# Patient Record
Sex: Female | Born: 1960
Health system: Southern US, Community
[De-identification: ages and names within clinical notes are randomized; demographics above are authoritative.]

## PROBLEM LIST (undated history)

## (undated) DIAGNOSIS — E039 Hypothyroidism, unspecified: Secondary | ICD-10-CM

## (undated) DIAGNOSIS — G629 Polyneuropathy, unspecified: Secondary | ICD-10-CM

## (undated) DIAGNOSIS — E669 Obesity, unspecified: Secondary | ICD-10-CM

## (undated) DIAGNOSIS — E118 Type 2 diabetes mellitus with unspecified complications: Secondary | ICD-10-CM

## (undated) DIAGNOSIS — E1165 Type 2 diabetes mellitus with hyperglycemia: Secondary | ICD-10-CM

## (undated) DIAGNOSIS — IMO0002 Reserved for concepts with insufficient information to code with codable children: Secondary | ICD-10-CM

## (undated) DIAGNOSIS — Z9289 Personal history of other medical treatment: Secondary | ICD-10-CM

## (undated) DIAGNOSIS — F32A Depression, unspecified: Secondary | ICD-10-CM

## (undated) DIAGNOSIS — I10 Essential (primary) hypertension: Secondary | ICD-10-CM

## (undated) DIAGNOSIS — D649 Anemia, unspecified: Secondary | ICD-10-CM

## (undated) DIAGNOSIS — L97509 Non-pressure chronic ulcer of other part of unspecified foot with unspecified severity: Secondary | ICD-10-CM

## (undated) DIAGNOSIS — H547 Unspecified visual loss: Secondary | ICD-10-CM

## (undated) DIAGNOSIS — IMO0001 Reserved for inherently not codable concepts without codable children: Secondary | ICD-10-CM

## (undated) DIAGNOSIS — N189 Chronic kidney disease, unspecified: Secondary | ICD-10-CM

## (undated) DIAGNOSIS — E11621 Type 2 diabetes mellitus with foot ulcer: Secondary | ICD-10-CM

## (undated) DIAGNOSIS — R079 Chest pain, unspecified: Secondary | ICD-10-CM

## (undated) DIAGNOSIS — H269 Unspecified cataract: Secondary | ICD-10-CM

## (undated) DIAGNOSIS — F329 Major depressive disorder, single episode, unspecified: Secondary | ICD-10-CM

## (undated) HISTORY — PX: TUBAL LIGATION: SHX77

---

## 2012-10-26 ENCOUNTER — Emergency Department (HOSPITAL_COMMUNITY)
Admission: EM | Admit: 2012-10-26 | Discharge: 2012-10-26 | Disposition: A | Discharge status: Home / Self Care | Payer: Self-pay | Attending: Emergency Medicine | Admitting: Emergency Medicine

## 2012-10-26 ENCOUNTER — Emergency Department (HOSPITAL_COMMUNITY): Payer: Self-pay

## 2012-10-26 ENCOUNTER — Encounter (HOSPITAL_COMMUNITY): Payer: Self-pay | Admitting: Emergency Medicine

## 2012-10-26 DIAGNOSIS — R3589 Other polyuria: Secondary | ICD-10-CM | POA: Insufficient documentation

## 2012-10-26 DIAGNOSIS — R059 Cough, unspecified: Secondary | ICD-10-CM | POA: Insufficient documentation

## 2012-10-26 DIAGNOSIS — Z8639 Personal history of other endocrine, nutritional and metabolic disease: Secondary | ICD-10-CM | POA: Insufficient documentation

## 2012-10-26 DIAGNOSIS — H538 Other visual disturbances: Secondary | ICD-10-CM | POA: Insufficient documentation

## 2012-10-26 DIAGNOSIS — R05 Cough: Secondary | ICD-10-CM | POA: Insufficient documentation

## 2012-10-26 DIAGNOSIS — IMO0001 Reserved for inherently not codable concepts without codable children: Secondary | ICD-10-CM | POA: Insufficient documentation

## 2012-10-26 DIAGNOSIS — Z9119 Patient's noncompliance with other medical treatment and regimen: Secondary | ICD-10-CM | POA: Insufficient documentation

## 2012-10-26 DIAGNOSIS — I10 Essential (primary) hypertension: Secondary | ICD-10-CM | POA: Insufficient documentation

## 2012-10-26 DIAGNOSIS — R739 Hyperglycemia, unspecified: Secondary | ICD-10-CM

## 2012-10-26 DIAGNOSIS — Z862 Personal history of diseases of the blood and blood-forming organs and certain disorders involving the immune mechanism: Secondary | ICD-10-CM | POA: Insufficient documentation

## 2012-10-26 DIAGNOSIS — R110 Nausea: Secondary | ICD-10-CM | POA: Insufficient documentation

## 2012-10-26 DIAGNOSIS — R350 Frequency of micturition: Secondary | ICD-10-CM | POA: Insufficient documentation

## 2012-10-26 DIAGNOSIS — R42 Dizziness and giddiness: Secondary | ICD-10-CM | POA: Insufficient documentation

## 2012-10-26 DIAGNOSIS — E1165 Type 2 diabetes mellitus with hyperglycemia: Secondary | ICD-10-CM

## 2012-10-26 DIAGNOSIS — R0602 Shortness of breath: Secondary | ICD-10-CM | POA: Insufficient documentation

## 2012-10-26 DIAGNOSIS — Z91199 Patient's noncompliance with other medical treatment and regimen due to unspecified reason: Secondary | ICD-10-CM | POA: Insufficient documentation

## 2012-10-26 DIAGNOSIS — Z79899 Other long term (current) drug therapy: Secondary | ICD-10-CM | POA: Insufficient documentation

## 2012-10-26 DIAGNOSIS — IMO0002 Reserved for concepts with insufficient information to code with codable children: Secondary | ICD-10-CM | POA: Insufficient documentation

## 2012-10-26 DIAGNOSIS — L97509 Non-pressure chronic ulcer of other part of unspecified foot with unspecified severity: Secondary | ICD-10-CM | POA: Insufficient documentation

## 2012-10-26 DIAGNOSIS — R358 Other polyuria: Secondary | ICD-10-CM | POA: Insufficient documentation

## 2012-10-26 DIAGNOSIS — Z794 Long term (current) use of insulin: Secondary | ICD-10-CM | POA: Insufficient documentation

## 2012-10-26 HISTORY — DX: Essential (primary) hypertension: I10

## 2012-10-26 LAB — URINALYSIS, ROUTINE W REFLEX MICROSCOPIC
Glucose, UA: >1000 mg/dL — AB
Leukocytes, UA: NEGATIVE
Specific Gravity, Urine: 1.04 — ABNORMAL HIGH (ref 1.005–1.030)
pH: 5 (ref 5.0–8.0)

## 2012-10-26 LAB — CBC WITH DIFFERENTIAL/PLATELET
Basophils Absolute: 0 10*3/uL (ref 0.0–0.1)
Eosinophils Relative: 2 % (ref 0–5)
Lymphocytes Relative: 31 % (ref 12–46)
Lymphs Abs: 3.2 10*3/uL (ref 0.7–4.0)
MCV: 81.2 fL (ref 78.0–100.0)
Neutro Abs: 6.2 10*3/uL (ref 1.7–7.7)
Platelets: 269 10*3/uL (ref 150–400)
RBC: 5.2 MIL/uL — ABNORMAL HIGH (ref 3.87–5.11)
WBC: 10.2 10*3/uL (ref 4.0–10.5)

## 2012-10-26 LAB — BASIC METABOLIC PANEL
CO2: 28 mEq/L (ref 19–32)
Chloride: 101 mEq/L (ref 96–112)
Glucose, Bld: 544 mg/dL — ABNORMAL HIGH (ref 70–99)
Potassium: 4.8 mEq/L (ref 3.5–5.1)
Sodium: 137 mEq/L (ref 135–145)

## 2012-10-26 LAB — GLUCOSE, CAPILLARY
Glucose-Capillary: 540 mg/dL — ABNORMAL HIGH (ref 70–99)
Glucose-Capillary: 415 mg/dL — ABNORMAL HIGH (ref 70–99)

## 2012-10-26 LAB — URINE MICROSCOPIC-ADD ON

## 2012-10-26 MED ORDER — SODIUM CHLORIDE 0.9 % IV BOLUS (SEPSIS)
1000.0000 mL | Freq: Once | INTRAVENOUS | Status: AC
  Administered 2012-10-26: 1000 mL via INTRAVENOUS

## 2012-10-26 MED ORDER — SODIUM CHLORIDE 0.9 % IV BOLUS (SEPSIS)
1000.0000 mL | INTRAVENOUS | Status: AC
  Administered 2012-10-26: 1000 mL via INTRAVENOUS

## 2012-10-26 MED ORDER — GLUCOSE BLOOD VI STRP: ORAL_STRIP | Status: DC

## 2012-10-26 MED ORDER — INSULIN NPH ISOPHANE & REGULAR (70-30) 100 UNIT/ML ~~LOC~~ SUSP: 25.0000 [IU] | Freq: Two times a day (BID) | SUBCUTANEOUS | Status: DC

## 2012-10-26 MED ORDER — INSULIN ASPART 100 UNIT/ML ~~LOC~~ SOLN
10.0000 [IU] | Freq: Once | SUBCUTANEOUS | Status: AC
  Administered 2012-10-26: 10 [IU] via SUBCUTANEOUS
  Filled 2012-10-26: qty 1

## 2012-10-26 MED ORDER — IBUPROFEN 400 MG PO TABS
400.0000 mg | ORAL_TABLET | Freq: Once | ORAL | Status: AC
Start: 2012-10-26 — End: 2012-10-26
  Administered 2012-10-26: 400 mg via ORAL
  Filled 2012-10-26: qty 1

## 2012-10-26 MED ORDER — GLIMEPIRIDE 1 MG PO TABS: 4.0000 mg | ORAL_TABLET | Freq: Every day | ORAL | Status: DC

## 2012-10-26 NOTE — ED Notes (Signed)
Per family friend pt was picked up from airport and had some sob, dizziness, near syncopal episode, blurry vision, and frequent urination. Pt has not taken any medications in 4 days due to traveling. Pt has hx high blood pressure and diabetes.

## 2012-10-26 NOTE — ED Provider Notes (Signed)
52 year old female who is a diabetic who has been unmedicated for the last several days that she has been traveling from the [place] by plane. She presents with increasing thirst and generalized weakness with a blood sugar of greater than 500 on arrival. On exam the patient has a soft abdomen, clear heart and lung sounds after coughing. No peripheral edema or asymmetry of the lower extremities, though she does have a small eschar to the right fourth toe on the lateral surface and the left fifth toe on the lateral surface. clear oropharynx with normal appearing mucous membranes. Laboratory workup shows that the patient is hyperglycemic but no signs of ketoacidosis or increased anion gap acidosis. She has been given IV fluids greater than 2 L, subcutaneous insulin and is now asking to eat. Diabetic diet ordered, recheck blood sugar, will need prescription medications and followup in the outpatient setting. The lesions that she has on her toes appear to be chronic, not acutely infected, not erythematous, not swollen not warm.   Medical screening examination/treatment/procedure(s) were conducted as a shared visit with non-physician practitioner(s) and myself.  I personally evaluated the patient during the encounter    Vida Roller, MD 10/26/12 1510

## 2012-10-26 NOTE — ED Notes (Signed)
CBG 540. 

## 2012-10-26 NOTE — ED Notes (Signed)
Pt c/o headache

## 2012-10-26 NOTE — ED Notes (Signed)
Meal tray ordered for pt  

## 2012-10-26 NOTE — ED Notes (Signed)
Pt received meal tray. 

## 2012-10-26 NOTE — ED Provider Notes (Signed)
History     CSN: [id number]  Arrival date & time 10/26/12  1108   First MD Initiated Contact with Patient 10/26/12 1120      Chief Complaint  Patient presents with  . Hyperglycemia    (Consider location/radiation/quality/duration/timing/severity/associated sxs/prior treatment) HPI Comments: Is a 52 year old female with a history of hypertension and diabetes who presents for lightheadedness, dizziness, intermittent blurry vision, polyuria, and frequent urination without aggravating or alleviating factors. Patient admits to associated nausea and intermittent SOB. She denies LOC, fever, CP, vomiting, and abdominal pain. Prior to arrival the patient arrived from [place] as she is immigrating to the [place]. She states that she has not taken any of her medications in 4 days because of her extensive travel from [place]. Patient usually takes 70/30 insulin, 30 units in the morning and 25 units at night. She also takes Tenormin 100mg  daily for her blood pressure which she also denies taking secondary to travel.  The history is provided by a friend, a relative and the patient. The history is limited by a language barrier. A language interpreter was used.    Past Medical History  Diagnosis Date  . Diabetes mellitus without complication   . Hypertension   . Thyroid disease     hypothyroid    History reviewed. No pertinent past surgical history.  History reviewed. No pertinent family history.  History  Substance Use Topics  . Smoking status: Not on file  . Smokeless tobacco: Not on file  . Alcohol Use: Not on file    OB History   Grav Para Term Preterm Abortions TAB SAB Ect Mult Living                  Review of Systems  Constitutional: Negative for fever.  Eyes: Positive for visual disturbance. Negative for pain.       +intermittent blurry vision  Respiratory: Positive for cough and shortness of breath.   Cardiovascular: Negative for chest pain.  Gastrointestinal: Negative  for vomiting and abdominal pain.  Endocrine: Negative for polydipsia and polyuria.  Musculoskeletal: Positive for myalgias.  Skin: Negative for color change and rash.  Neurological: Positive for dizziness and light-headedness. Negative for syncope.  All other systems reviewed and are negative.   Allergies  Review of patient's allergies indicates no known allergies.  Home Medications   Current Outpatient Rx  Name  Route  Sig  Dispense  Refill  . atenolol (TENORMIN) 100 MG tablet   Oral   Take 100 mg by mouth daily.         Marland Kitchen glimepiride (AMARYL) 4 MG tablet   Oral   Take 8 mg by mouth daily.         . insulin NPH-regular (NOVOLIN 70/30) (70-30) 100 UNIT/ML injection   Subcutaneous   Inject 25-30 Units into the skin 2 (two) times daily with a meal. 30 units every morning and 25 units at dinner         . glimepiride (AMARYL) 1 MG tablet   Oral   Take 4 tablets (4 mg total) by mouth daily before breakfast.   120 tablet   1   . glucose blood test strip      Use as instructed   100 each   12   . insulin NPH-regular (NOVOLIN 70/30) (70-30) 100 UNIT/ML injection   Subcutaneous   Inject 25-30 Units into the skin 2 (two) times daily. Take 30 units every morning and 25 units at dinner. Refill x 1.  10 mL   12     BP 104/50  Pulse 74  Temp(Src) 98.2 F (36.8 C) (Oral)  Resp 21  SpO2 99%  Physical Exam  Nursing note and vitals reviewed. Constitutional: She is oriented to person, place, and time. She appears well-developed and well-nourished. No distress.  Morbildy obese female, sick but nontoxic appearing. In NAD  HENT:  Head: Normocephalic and atraumatic.  Nose: Nose normal.  Oropharynx clear with dry mucous membranes  Eyes: Conjunctivae are normal. Pupils are equal, round, and reactive to light. No scleral icterus.  Neck: Normal range of motion. Neck supple.  Cardiovascular: Normal heart sounds and intact distal pulses.   Tachycardic rate, regular rhythm.  Distal radial and posterior tibial pulses 2+ b/l; dorsalis pedis pulses 1+ b/l. Capillary refill <2 seconds in b/l upper and lower extremities.  Pulmonary/Chest: No respiratory distress. She has no wheezes. She has no rales.  Poor inspiratory effort. Rhonchorous breath sounds appreciated in the left upper lung field on auscultation.  Abdominal: Soft. She exhibits no mass. There is no tenderness. There is no rebound and no guarding.  Musculoskeletal: Normal range of motion. She exhibits no edema.  Lymphadenopathy:    She has no cervical adenopathy.  Neurological: She is alert and oriented to person, place, and time.  Skin: Skin is warm and dry. No rash noted. She is not diaphoretic. No erythema. No pallor.  Patient with eschar to dorsal surface of R 4th toe and L 5th toe without swelling, warmth, erythema, or drainage.  Psychiatric: She has a normal mood and affect. Her behavior is normal.    ED Course  Procedures (including critical care time)  Labs Reviewed  CBC WITH DIFFERENTIAL - Abnormal; Notable for the following:    RBC 5.20 (*)    All other components within normal limits  BASIC METABOLIC PANEL - Abnormal; Notable for the following:    Glucose, Bld 544 (*)    BUN 25 (*)    All other components within normal limits  URINALYSIS, ROUTINE W REFLEX MICROSCOPIC - Abnormal; Notable for the following:    Specific Gravity, Urine 1.040 (*)    Glucose, UA >1000 (*)    Hgb urine dipstick LARGE (*)    All other components within normal limits  GLUCOSE, CAPILLARY - Abnormal; Notable for the following:    Glucose-Capillary 540 (*)    All other components within normal limits  GLUCOSE, CAPILLARY - Abnormal; Notable for the following:    Glucose-Capillary 415 (*)    All other components within normal limits  URINE MICROSCOPIC-ADD ON - Abnormal; Notable for the following:    Squamous Epithelial / LPF MANY (*)    All other components within normal limits  GLUCOSE, CAPILLARY - Abnormal;  Notable for the following:    Glucose-Capillary 248 (*)    All other components within normal limits   Dg Chest 2 View  10/26/2012  *RADIOLOGY REPORT*  Clinical Data: Short of breath.  Cough.  Chest pain.  Hypertension and diabetes.  CHEST - 2 VIEW  Comparison: None.  Findings: Low lung volumes are seen with mild bibasilar atelectasis.  Respiratory motion also noted on the lateral projection.  No evidence of pulmonary consolidation or congestive heart failure.  No evidence of pleural effusion.  Heart size is within normal limits.  IMPRESSION: Low lung volumes and mild bibasilar atelectasis.   Original Report Authenticated By: Myles Rosenthal, M.D.    Dg Foot Complete Left  10/26/2012  *RADIOLOGY REPORT*  Clinical Data:  Ulcers.  Diabetic.  LEFT FOOT - COMPLETE 3+ VIEW  Comparison: None.  Findings: No acute bony abnormality.  Specifically, no fracture, subluxation, or dislocation.  Soft tissues are intact.  No radiographic changes to suggest osteomyelitis.  Dense vascular calcifications are present.  IMPRESSION: No acute bony abnormality.   Original Report Authenticated By: Charlett Nose, M.D.    Dg Foot Complete Right  10/26/2012  *RADIOLOGY REPORT*  Clinical Data: Hyperglycemia.  Ulcer.  Diabetic.  RIGHT FOOT COMPLETE - 3+ VIEW  Comparison: None  Findings: Degenerative changes in the right first MTP joint with joint space narrowing and spurring. No acute bony abnormality. Specifically, no fracture, subluxation, or dislocation.  Soft tissues are intact.  No radiographic changes of osteomyelitis. Dense vascular calcifications present.  IMPRESSION: No acute bony abnormality.   Original Report Authenticated By: Charlett Nose, M.D.      Date: 10/26/2012  Rate: 95  Rhythm: normal sinus rhythm  QRS Axis: left  Intervals: normal  ST/T Wave abnormalities: normal  Conduction Disutrbances:left anterior fascicular block  Narrative Interpretation: NSR with PRWP and LAFB; no STEMI  Old EKG Reviewed: none available I  have personally reviewed and interpreted this EKG.   1. Hyperglycemia   2. Diabetes mellitus type 2, uncontrolled     MDM  Patient is a 52 year old female who immigrated to the [place] this morning who presents with dizziness, blurry vision, polyuria, and polydipsia. Patient also complains of shortness of breath with rhonchorous breath sounds appreciated in the upper left lung field. Patient endorses noncompliance with her blood pressure medication and insulin. Workup to include CBC, BMP, urinalysis, and chest x-ray. IV fluid bolus ordered.  Patient with normal anion gap of 8 without signs of DKA or anion gap acidosis. CBG 540. Eschars on b/l feet, later appreciated on physical exam, appear to be the result of diabetic neuropathy; no signs of infection such as swelling, erythema, or warmth. Will add DG b/l feet to r/o bone involvement. Second IVF bolus ordered.  DG b/l feet without evidence of bony abnormalities. CBG trending down; 415 from 540. Patient without complaints at this time; is feeling hungry. Diabetic meal to be given and patient tolerating PO fluids.  Patient's symptoms have greatly improved since arrival in ED. CBG down to 169. Patient otherwise hemodynamically stable, well and nontoxic appearing, resting comfortably in the bed. Stable for d/c with PCP follow up for further evaluation and management of her diabetes. Rx for Novolin 70/30 as well as glimepiride prescribed for patient; care management to coordinate filling patient's prescriptions as she is without insurance and new to the country less than 24 hours. Patient's sister instructed to come to ED tomorrow to pick up prescriptions. Have explained that atenolol will not be filled at this time as patient has not been hypertensive during her ED stay today. Indications for ED return provided. Patient and family state comfort and understanding with this d/c plan with no unaddressed concerns. Patient seen, also, by Dr. Hyacinth Meeker who  is in agreement with this patient's work up and management plan.   Antony Madura, PA-C 10/26/12 2114

## 2012-10-27 NOTE — Progress Notes (Signed)
10/27/12 1105 Received TC from PA in Emergency Room last night about pt. need for Medication assistance.  Pt. is eligible for [facility] program.  Printed MATCH letter and this NCM gave the charge nurse, Martie Lee, the [facility] letter to give to pt. today when she comes in for her RX of Insulin and Amaryl.  Explained to [place], Charity fundraiser, to let pt. know she has to use one of the pharmacies list on the [facility] letter, and she has to use the letter within 7days.  Tera Mater, RN, BSN NCM [phone]

## 2012-10-28 NOTE — ED Provider Notes (Signed)
  Medical screening examination/treatment/procedure(s) were conducted as a shared visit with non-physician practitioner(s) and myself.  I personally evaluated the patient during the encounter  Please see my separate respective documentation pertaining to this patient encounter   Vida Roller, MD 10/28/12 1027

## 2012-10-29 LAB — GLUCOSE, CAPILLARY: Glucose-Capillary: 169 mg/dL — ABNORMAL HIGH (ref 70–99)

## 2012-10-31 ENCOUNTER — Emergency Department (HOSPITAL_COMMUNITY): Payer: Medicaid Other

## 2012-10-31 ENCOUNTER — Emergency Department (HOSPITAL_COMMUNITY)
Admission: EM | Admit: 2012-10-31 | Discharge: 2012-10-31 | Disposition: A | Discharge status: Home / Self Care | Payer: Medicaid Other | Attending: Emergency Medicine | Admitting: Emergency Medicine

## 2012-10-31 ENCOUNTER — Encounter (HOSPITAL_COMMUNITY): Payer: Self-pay | Admitting: Emergency Medicine

## 2012-10-31 DIAGNOSIS — F411 Generalized anxiety disorder: Secondary | ICD-10-CM | POA: Insufficient documentation

## 2012-10-31 DIAGNOSIS — Z794 Long term (current) use of insulin: Secondary | ICD-10-CM | POA: Insufficient documentation

## 2012-10-31 DIAGNOSIS — F410 Panic disorder [episodic paroxysmal anxiety] without agoraphobia: Secondary | ICD-10-CM

## 2012-10-31 DIAGNOSIS — R079 Chest pain, unspecified: Secondary | ICD-10-CM | POA: Insufficient documentation

## 2012-10-31 DIAGNOSIS — Z79899 Other long term (current) drug therapy: Secondary | ICD-10-CM | POA: Insufficient documentation

## 2012-10-31 DIAGNOSIS — Z8639 Personal history of other endocrine, nutritional and metabolic disease: Secondary | ICD-10-CM | POA: Insufficient documentation

## 2012-10-31 DIAGNOSIS — E119 Type 2 diabetes mellitus without complications: Secondary | ICD-10-CM | POA: Insufficient documentation

## 2012-10-31 DIAGNOSIS — I10 Essential (primary) hypertension: Secondary | ICD-10-CM | POA: Insufficient documentation

## 2012-10-31 DIAGNOSIS — R404 Transient alteration of awareness: Secondary | ICD-10-CM | POA: Insufficient documentation

## 2012-10-31 DIAGNOSIS — Z862 Personal history of diseases of the blood and blood-forming organs and certain disorders involving the immune mechanism: Secondary | ICD-10-CM | POA: Insufficient documentation

## 2012-10-31 DIAGNOSIS — R55 Syncope and collapse: Secondary | ICD-10-CM | POA: Insufficient documentation

## 2012-10-31 LAB — POCT I-STAT 3, ART BLOOD GAS (G3+)
Acid-Base Excess: 3 mmol/L — ABNORMAL HIGH (ref 0.0–2.0)
Bicarbonate: 23.9 mEq/L (ref 20.0–24.0)
O2 Saturation: 92 %
pCO2 arterial: 27.4 mmHg — ABNORMAL LOW (ref 35.0–45.0)
pO2, Arterial: 54 mmHg — ABNORMAL LOW (ref 80.0–100.0)

## 2012-10-31 LAB — BASIC METABOLIC PANEL
BUN: 12 mg/dL (ref 6–23)
Creatinine, Ser: 0.63 mg/dL (ref 0.50–1.10)
GFR calc Af Amer: >90 mL/min (ref >90)
GFR calc non Af Amer: >90 mL/min (ref >90)
Glucose, Bld: 322 mg/dL — ABNORMAL HIGH (ref 70–99)

## 2012-10-31 LAB — GLUCOSE, CAPILLARY: Glucose-Capillary: 268 mg/dL — ABNORMAL HIGH (ref 70–99)

## 2012-10-31 LAB — CBC
HCT: 43.6 % (ref 36.0–46.0)
MCHC: 35.1 g/dL (ref 30.0–36.0)
MCV: 78.4 fL (ref 78.0–100.0)
RDW: 13.1 % (ref 11.5–15.5)

## 2012-10-31 MED ORDER — IOHEXOL 350 MG/ML SOLN
100.0000 mL | Freq: Once | INTRAVENOUS | Status: AC | PRN
  Administered 2012-10-31: 100 mL via INTRAVENOUS

## 2012-10-31 MED ORDER — METHYLPREDNISOLONE SODIUM SUCC 125 MG IJ SOLR
INTRAMUSCULAR | Status: AC
  Filled 2012-10-31: qty 2

## 2012-10-31 MED ORDER — FENTANYL CITRATE 0.05 MG/ML IJ SOLN
100.0000 ug | Freq: Once | INTRAMUSCULAR | Status: AC
  Administered 2012-10-31: 100 ug via INTRAVENOUS
  Filled 2012-10-31: qty 2

## 2012-10-31 MED ORDER — SODIUM CHLORIDE 0.9 % IV BOLUS (SEPSIS)
1000.0000 mL | Freq: Once | INTRAVENOUS | Status: AC
  Administered 2012-10-31: 1000 mL via INTRAVENOUS

## 2012-10-31 MED ORDER — DIPHENHYDRAMINE HCL 50 MG/ML IJ SOLN
INTRAMUSCULAR | Status: AC
  Filled 2012-10-31: qty 1

## 2012-10-31 MED ORDER — ALPRAZOLAM 0.5 MG PO TABS: 0.5000 mg | ORAL_TABLET | Freq: Three times a day (TID) | ORAL | Status: DC | PRN

## 2012-10-31 MED ORDER — LORAZEPAM 2 MG/ML IJ SOLN
1.0000 mg | Freq: Once | INTRAMUSCULAR | Status: AC
  Administered 2012-10-31: 1 mg via INTRAVENOUS
  Filled 2012-10-31: qty 1

## 2012-10-31 NOTE — ED Provider Notes (Signed)
  Physical Exam  BP 110/47  Pulse 83  Temp(Src) 97.9 F (36.6 C) (Oral)  Resp 28  SpO2 99%  Physical Exam  ED Course  Procedures  The patient is most likely having an anxiety type reaction based on history from the sister. The patient has been having these attacks for several years. The patient will be referred to PCP. Told to return here as needed. Not in DKA.       Carlyle Dolly, PA-C 10/31/12 2105

## 2012-10-31 NOTE — ED Notes (Signed)
Per CT staff. Pt received contrast and became increasingly SOB and altered. Pts vital signs WNL. Pt responsive to name. RN and RRT called by CT staff. EDP, EDPA and RN to scanner. Pt respirations moderately labored O2sats 100/2L pt CBG 268. Pt. Does not speak english. Family member brought- sts pt "sometimes goes through this" family sts pt has anxiety.

## 2012-10-31 NOTE — ED Provider Notes (Signed)
History     CSN: [id number]  Arrival date & time 10/31/12  1346   First MD Initiated Contact with Patient 10/31/12 1347      Chief Complaint  Patient presents with  . Loss of Consciousness  . Shortness of Breath    (Consider location/radiation/quality/duration/timing/severity/associated sxs/prior treatment) HPI Comments: Patient is a 52 year old female with a past medical history of diabetes and hypertension who presents after a syncopal episode that occurred prior to arrival. Her sister is at the bedside who provides the history. Patient started to feel short of breath and lightheaded when she was assisted to the floor. She continues to feel SOB. Patient reports associated left side chest pain that radiates around to her back. Patient arrived in [place] from [place] 5 days ago and has not been feeling well since she arrived. No aggravating/alleviating factors.    Past Medical History  Diagnosis Date  . Diabetes mellitus without complication   . Hypertension   . Thyroid disease     hypothyroid    History reviewed. No pertinent past surgical history.  No family history on file.  History  Substance Use Topics  . Smoking status: Never Smoker   . Smokeless tobacco: Not on file  . Alcohol Use: No    OB History   Grav Para Term Preterm Abortions TAB SAB Ect Mult Living                  Review of Systems  Respiratory: Positive for shortness of breath.   Cardiovascular: Positive for chest pain.  Neurological: Positive for syncope.  All other systems reviewed and are negative.    Allergies  Review of patient's allergies indicates no known allergies.  Home Medications   Current Outpatient Rx  Name  Route  Sig  Dispense  Refill  . atenolol (TENORMIN) 100 MG tablet   Oral   Take 100 mg by mouth daily.         Marland Kitchen glimepiride (AMARYL) 1 MG tablet   Oral   Take 4 tablets (4 mg total) by mouth daily before breakfast.   120 tablet   1   . glimepiride (AMARYL) 4 MG  tablet   Oral   Take 8 mg by mouth daily.         Marland Kitchen glucose blood test strip      Use as instructed   100 each   12   . insulin NPH-regular (NOVOLIN 70/30) (70-30) 100 UNIT/ML injection   Subcutaneous   Inject 25-30 Units into the skin 2 (two) times daily with a meal. 30 units every morning and 25 units at dinner         . insulin NPH-regular (NOVOLIN 70/30) (70-30) 100 UNIT/ML injection   Subcutaneous   Inject 25-30 Units into the skin 2 (two) times daily. Take 30 units every morning and 25 units at dinner. Refill x 1.   10 mL   12     BP 149/67  Pulse 89  Temp(Src) 97.9 F (36.6 C) (Oral)  Resp 26  SpO2 100%  Physical Exam  Nursing note and vitals reviewed. Constitutional: She is oriented to person, place, and time. She appears well-developed and well-nourished. No distress.  HENT:  Head: Normocephalic and atraumatic.  Eyes: Conjunctivae and EOM are normal. Pupils are equal, round, and reactive to light.  Neck: Normal range of motion. Neck supple.  Cardiovascular: Normal rate and regular rhythm.  Exam reveals no gallop and no friction rub.  No murmur heard. Pulmonary/Chest: Breath sounds normal. She has no wheezes. She has no rales. She exhibits no tenderness.  Patient tachypneic.   Abdominal: Soft. She exhibits no distension. There is no tenderness. There is no rebound and no guarding.  Musculoskeletal: Normal range of motion.  Neurological: She is alert and oriented to person, place, and time. Coordination normal.  Speech is goal-oriented. Moves limbs without ataxia.   Skin: Skin is warm and dry.  Psychiatric: She has a normal mood and affect. Her behavior is normal.    ED Course  Procedures (including critical care time)  Labs Reviewed  GLUCOSE, CAPILLARY - Abnormal; Notable for the following:    Glucose-Capillary 310 (*)    All other components within normal limits  CBC - Abnormal; Notable for the following:    RBC 5.56 (*)    Hemoglobin 15.3 (*)     All other components within normal limits  BASIC METABOLIC PANEL - Abnormal; Notable for the following:    Glucose, Bld 322 (*)    All other components within normal limits  D-DIMER, QUANTITATIVE - Abnormal; Notable for the following:    D-Dimer, Quant 0.63 (*)    All other components within normal limits  GLUCOSE, CAPILLARY - Abnormal; Notable for the following:    Glucose-Capillary 268 (*)    All other components within normal limits  POCT I-STAT 3, BLOOD GAS (G3+) - Abnormal; Notable for the following:    pH, Arterial 7.548 (*)    pCO2 arterial 27.4 (*)    pO2, Arterial 54.0 (*)    Acid-Base Excess 3.0 (*)    All other components within normal limits  POCT I-STAT TROPONIN I   Ct Head Wo Contrast  10/31/2012  *RADIOLOGY REPORT*  Clinical Data: Mental status change  CT HEAD WITHOUT CONTRAST  Technique:  Contiguous axial images were obtained from the base of the skull through the vertex without contrast.  Comparison: CT 10/31/2012  Findings: Mild to moderate atrophy for age.  Ventricles are not enlarged.  Negative for acute infarct, hemorrhage, or mass lesion. No change from the prior study.  IMPRESSION: Generalized atrophy.  No acute abnormality.   Original Report Authenticated By: Janeece Riggers, M.D.    Ct Angio Chest Pe W/cm &/or Wo Cm  10/31/2012  *RADIOLOGY REPORT*  Clinical Data: 52 year old female with sudden onset shortness of breath.  Syncope.  Hypoxia.  CT ANGIOGRAPHY CHEST  Technique:  Multidetector CT imaging of the chest using the standard protocol during bolus administration of intravenous contrast. Multiplanar reconstructed images including MIPs were obtained and reviewed to evaluate the vascular anatomy.  Contrast: OMNIPAQUE IOHEXOL 350 MG/ML SOLN  Comparison: Chest radiographs earlier the same day.  Findings: Good contrast bolus timing in the pulmonary arterial tree.  Mild respiratory motion artifact intermittently occurs. Allowing for this, No focal filling defect  identified in the pulmonary arterial tree to suggest the presence of acute pulmonary embolism.  Atelectatic changes intermittently noted in the major airways. Dependent pulmonary atelectasis.  No pericardial or pleural effusion.  Small nodule incidentally noted along the right minor fissure compatible with post inflammatory etiology.  Negative lung parenchyma otherwise.  No acute osseous abnormality identified.    No mediastinal lymphadenopathy.  Partially calcified right thyroid lobe nodule measuring 19 mm.  Negative visualized aorta.  Negative visualized upper abdominal viscera.  IMPRESSION:  1.  Intermittent respiratory motion artifact. No evidence of acute pulmonary embolus. 2.  Pulmonary atelectasis. 3.  Nearly 2 cm right thyroid nodule is  partially calcified. Consider further evaluation with nonemergent thyroid ultrasound. If the patient is clinically hyperthyroid, consider nuclear medicine thyroid scan.   Original Report Authenticated By: Erskine Speed, M.D.    Dg Chest Port 1 View  10/31/2012  *RADIOLOGY REPORT*  Clinical Data: Short of breath and syncope  PORTABLE CHEST - 1 VIEW  Comparison: 10/26/2012  Findings: Lung volume is normal.  Lungs are clear without infiltrate or effusion.  Negative for heart failure or mass.  IMPRESSION: No acute abnormality.   Original Report Authenticated By: Janeece Riggers, M.D.      1. Anxiety attack       MDM  3:09 PM Labs pending. Troponin negative. Chest xray unremarkable.   3:23 PM D-dimer elevated. Patient will have a CT angio chest. Patient signed out to Ebbie Ridge, PA-C.       Emilia Beck, PA-C 11/01/12 1357

## 2012-10-31 NOTE — ED Provider Notes (Signed)
Medical screening examination/treatment/procedure(s) were conducted as a shared visit with non-physician practitioner(s) and myself.  I personally evaluated the patient during the encounter   Loren Racer, MD 10/31/12 2317

## 2012-10-31 NOTE — ED Notes (Signed)
Onset today developed sudden onset of shortness of breath and syncope witnessed assisted to floor.  Per EMS alert to verbal placed on NRB mask 15 liter 98% oxygen level breathing 40-50.minute shallow breathes. Sister at bedside. Seen in ED recently 10/26/2012

## 2012-10-31 NOTE — ED Notes (Signed)
Pt c/o sharp pain in right upper abdomen. Unable to rate. Ebbie Ridge PA made aware

## 2012-11-01 NOTE — ED Provider Notes (Signed)
Medical screening examination/treatment/procedure(s) were performed by non-physician practitioner and as supervising physician I was immediately available for consultation/collaboration.   Blonnie Maske L Joetta Delprado, MD 11/01/12 1408 

## 2012-11-05 ENCOUNTER — Encounter (HOSPITAL_COMMUNITY): Payer: Self-pay

## 2012-11-05 ENCOUNTER — Emergency Department (INDEPENDENT_AMBULATORY_CARE_PROVIDER_SITE_OTHER)
Admission: EM | Admit: 2012-11-05 | Discharge: 2012-11-05 | Disposition: A | Discharge status: Other Acute Inpatient Hospital | Payer: Self-pay | Source: Home / Self Care

## 2012-11-05 ENCOUNTER — Emergency Department (HOSPITAL_COMMUNITY)
Admission: EM | Admit: 2012-11-05 | Discharge: 2012-11-05 | Disposition: A | Discharge status: Home / Self Care | Payer: Medicaid Other | Attending: Emergency Medicine | Admitting: Emergency Medicine

## 2012-11-05 DIAGNOSIS — L97509 Non-pressure chronic ulcer of other part of unspecified foot with unspecified severity: Secondary | ICD-10-CM | POA: Insufficient documentation

## 2012-11-05 DIAGNOSIS — R5383 Other fatigue: Secondary | ICD-10-CM

## 2012-11-05 DIAGNOSIS — E119 Type 2 diabetes mellitus without complications: Secondary | ICD-10-CM

## 2012-11-05 DIAGNOSIS — E039 Hypothyroidism, unspecified: Secondary | ICD-10-CM | POA: Insufficient documentation

## 2012-11-05 DIAGNOSIS — R0602 Shortness of breath: Secondary | ICD-10-CM

## 2012-11-05 DIAGNOSIS — F419 Anxiety disorder, unspecified: Secondary | ICD-10-CM

## 2012-11-05 DIAGNOSIS — Z79899 Other long term (current) drug therapy: Secondary | ICD-10-CM | POA: Insufficient documentation

## 2012-11-05 DIAGNOSIS — R234 Changes in skin texture: Secondary | ICD-10-CM

## 2012-11-05 DIAGNOSIS — I10 Essential (primary) hypertension: Secondary | ICD-10-CM

## 2012-11-05 DIAGNOSIS — R5381 Other malaise: Secondary | ICD-10-CM

## 2012-11-05 DIAGNOSIS — F411 Generalized anxiety disorder: Secondary | ICD-10-CM | POA: Insufficient documentation

## 2012-11-05 DIAGNOSIS — E1169 Type 2 diabetes mellitus with other specified complication: Secondary | ICD-10-CM | POA: Insufficient documentation

## 2012-11-05 DIAGNOSIS — Z794 Long term (current) use of insulin: Secondary | ICD-10-CM | POA: Insufficient documentation

## 2012-11-05 DIAGNOSIS — R21 Rash and other nonspecific skin eruption: Secondary | ICD-10-CM | POA: Insufficient documentation

## 2012-11-05 DIAGNOSIS — E1165 Type 2 diabetes mellitus with hyperglycemia: Secondary | ICD-10-CM

## 2012-11-05 LAB — URINALYSIS, ROUTINE W REFLEX MICROSCOPIC
Bilirubin Urine: NEGATIVE
Glucose, UA: >1000 mg/dL — AB
Hgb urine dipstick: NEGATIVE
Ketones, ur: 40 mg/dL — AB
Leukocytes, UA: NEGATIVE
Nitrite: NEGATIVE
Protein, ur: NEGATIVE mg/dL
Specific Gravity, Urine: 1.03 (ref 1.005–1.030)
Urobilinogen, UA: 0.2 mg/dL (ref 0.0–1.0)
pH: 7 (ref 5.0–8.0)

## 2012-11-05 LAB — COMPREHENSIVE METABOLIC PANEL WITH GFR
ALT: 31 U/L (ref 0–35)
AST: 21 U/L (ref 0–37)
Alkaline Phosphatase: 97 U/L (ref 39–117)
CO2: 23 meq/L (ref 19–32)
Calcium: 9.6 mg/dL (ref 8.4–10.5)
Chloride: 99 meq/L (ref 96–112)
GFR calc non Af Amer: >90 mL/min (ref >90)
Potassium: 4.5 meq/L (ref 3.5–5.1)
Sodium: 136 meq/L (ref 135–145)

## 2012-11-05 LAB — POCT I-STAT, CHEM 8
BUN: 15 mg/dL (ref 6–23)
Calcium, Ion: 1.06 mmol/L — ABNORMAL LOW (ref 1.12–1.23)
Chloride: 105 meq/L (ref 96–112)
Creatinine, Ser: 0.5 mg/dL (ref 0.50–1.10)
Glucose, Bld: 379 mg/dL — ABNORMAL HIGH (ref 70–99)
HCT: 46 % (ref 36.0–46.0)
Hemoglobin: 15.6 g/dL — ABNORMAL HIGH (ref 12.0–15.0)
Potassium: 4.3 mEq/L (ref 3.5–5.1)
Sodium: 137 mEq/L (ref 135–145)
TCO2: 25 mmol/L (ref 0–100)

## 2012-11-05 LAB — CBC WITH DIFFERENTIAL/PLATELET
Basophils Absolute: 0.1 10*3/uL (ref 0.0–0.1)
Basophils Relative: 1 % (ref 0–1)
Eosinophils Absolute: 0.1 10*3/uL (ref 0.0–0.7)
Eosinophils Relative: 2 % (ref 0–5)
HCT: 43.1 % (ref 36.0–46.0)
Hemoglobin: 15.3 g/dL — ABNORMAL HIGH (ref 12.0–15.0)
Lymphocytes Relative: 50 % — ABNORMAL HIGH (ref 12–46)
Lymphs Abs: 4.4 K/uL — ABNORMAL HIGH (ref 0.7–4.0)
MCH: 27.2 pg (ref 26.0–34.0)
MCHC: 35.5 g/dL (ref 30.0–36.0)
MCV: 76.7 fL — ABNORMAL LOW (ref 78.0–100.0)
Monocytes Absolute: 0.4 10*3/uL (ref 0.1–1.0)
Monocytes Relative: 4 % (ref 3–12)
Neutro Abs: 3.8 K/uL (ref 1.7–7.7)
Neutrophils Relative %: 44 % (ref 43–77)
Platelets: 222 K/uL (ref 150–400)
RBC: 5.62 MIL/uL — ABNORMAL HIGH (ref 3.87–5.11)
RDW: 13.2 % (ref 11.5–15.5)
WBC: 8.8 K/uL (ref 4.0–10.5)

## 2012-11-05 LAB — URINE MICROSCOPIC-ADD ON

## 2012-11-05 LAB — GLUCOSE, CAPILLARY
Glucose-Capillary: 366 mg/dL — ABNORMAL HIGH (ref 70–99)
Glucose-Capillary: 300 mg/dL — ABNORMAL HIGH (ref 70–99)
Glucose-Capillary: 251 mg/dL — ABNORMAL HIGH (ref 70–99)

## 2012-11-05 LAB — COMPREHENSIVE METABOLIC PANEL
Albumin: 3.3 g/dL — ABNORMAL LOW (ref 3.5–5.2)
BUN: 14 mg/dL (ref 6–23)
Creatinine, Ser: 0.61 mg/dL (ref 0.50–1.10)
GFR calc Af Amer: >90 mL/min (ref >90)
Glucose, Bld: 371 mg/dL — ABNORMAL HIGH (ref 70–99)
Total Bilirubin: 0.5 mg/dL (ref 0.3–1.2)
Total Protein: 6.8 g/dL (ref 6.0–8.3)

## 2012-11-05 LAB — POCT PREGNANCY, URINE: Preg Test, Ur: NEGATIVE

## 2012-11-05 MED ORDER — INSULIN ASPART 100 UNIT/ML ~~LOC~~ SOLN
10.0000 [IU] | Freq: Once | SUBCUTANEOUS | Status: AC
  Administered 2012-11-05: 10 [IU] via SUBCUTANEOUS

## 2012-11-05 MED ORDER — SODIUM CHLORIDE 0.9 % IV BOLUS (SEPSIS)
1000.0000 mL | Freq: Once | INTRAVENOUS | Status: AC
  Administered 2012-11-05: 1000 mL via INTRAVENOUS

## 2012-11-05 NOTE — ED Notes (Signed)
Attempted IV start X 1 right AC w/o success

## 2012-11-05 NOTE — ED Notes (Signed)
Pt from urgent care with a hyperglycemic episode. Pt's CBG in the 400's.  Upon arrival to ED, pt tachypneic and having what appears to be a panic attack.  Pt has a hx of panic attacks.  Pt also diaphoretic with nasal flaring and limited movement.  En route, [facility] started an IV to LAC and started a NS bolus.  Pt recently moved here this past month on the 18th.  Has been seen here multiple times for same symptoms.  Per family, pt is compliant with medication but continues to be hyperglycemic.

## 2012-11-05 NOTE — ED Provider Notes (Signed)
History     CSN: [id number]  Arrival date & time 11/05/12  1640   First MD Initiated Contact with Patient 11/05/12 1702      Chief Complaint  Patient presents with  . Hyperglycemia  . Panic Attack    Patient is a 52 y.o. female presenting with anxiety. The history is provided by the patient. The history is limited by a language barrier. A language interpreter was used (Arabic).  Anxiety This is a recurrent problem. The current episode started today. The problem occurs constantly. The problem has been unchanged. Associated symptoms include a rash. Pertinent negatives include no abdominal pain, chest pain, chills, coughing, fatigue, fever, neck pain, vomiting or weakness. Nothing aggravates the symptoms. She has tried nothing for the symptoms.    Past Medical History  Diagnosis Date  . Diabetes mellitus without complication   . Hypertension   . Thyroid disease     hypothyroid    History reviewed. No pertinent past surgical history.  History reviewed. No pertinent family history.  History  Substance Use Topics  . Smoking status: Never Smoker   . Smokeless tobacco: Not on file  . Alcohol Use: No    OB History   Grav Para Term Preterm Abortions TAB SAB Ect Mult Living                  Review of Systems  Constitutional: Negative for fever, chills, activity change, appetite change and fatigue.  HENT: Negative for neck pain and neck stiffness.   Respiratory: Negative for cough, chest tightness, shortness of breath and wheezing.   Cardiovascular: Negative for chest pain and palpitations.  Gastrointestinal: Negative for vomiting, abdominal pain, diarrhea and constipation.  Genitourinary: Negative for dysuria, decreased urine volume and difficulty urinating.  Skin: Positive for rash. Negative for wound.  Neurological: Negative for syncope, weakness and light-headedness.  Psychiatric/Behavioral: Positive for confusion and agitation. The patient is nervous/anxious.   All  other systems reviewed and are negative.    Allergies  Review of patient's allergies indicates no known allergies.  Home Medications   Current Outpatient Rx  Name  Route  Sig  Dispense  Refill  . ALPRAZolam (XANAX) 0.5 MG tablet   Oral   Take 1 tablet (0.5 mg total) by mouth 3 (three) times daily as needed for anxiety.   30 tablet   0   . atenolol (TENORMIN) 100 MG tablet   Oral   Take 100 mg by mouth daily.         Marland Kitchen glimepiride (AMARYL) 4 MG tablet   Oral   Take 8 mg by mouth 2 (two) times daily.         Marland Kitchen glucose blood test strip      Use as instructed   100 each   12   . insulin NPH-regular (NOVOLIN 70/30) (70-30) 100 UNIT/ML injection   Subcutaneous   Inject 25-30 Units into the skin 2 (two) times daily. Take 30 units every morning and 25 units at dinner. Refill x 1.   10 mL   12     BP 144/59  Pulse 75  Temp(Src) 98 F (36.7 C) (Oral)  Resp 34  SpO2 100%  Physical Exam  Nursing note and vitals reviewed. Constitutional: She is oriented to person, place, and time. She appears well-developed and well-nourished.  HENT:  Head: Normocephalic and atraumatic.  Right Ear: External ear normal.  Left Ear: External ear normal.  Nose: Nose normal.  Mouth/Throat: Oropharynx is clear and  moist. No oropharyngeal exudate.  Eyes: Conjunctivae are normal. Pupils are equal, round, and reactive to light.  Neck: Normal range of motion. Neck supple.  Cardiovascular: Normal rate, regular rhythm, normal heart sounds and intact distal pulses.  Exam reveals no gallop and no friction rub.   No murmur heard. Pulmonary/Chest: Effort normal and breath sounds normal. No respiratory distress. She has no wheezes. She has no rales. She exhibits no tenderness.  Abdominal: Soft. Bowel sounds are normal. She exhibits no distension and no mass. There is no tenderness. There is no rebound and no guarding.  Musculoskeletal: Normal range of motion. She exhibits no edema and no  tenderness.  Neurological: She is alert and oriented to person, place, and time.  Skin: Skin is warm and dry. Rash (black dried eschar overlying dorsal aspects of bilateral 2nd toes) noted. No erythema.     Psychiatric: She has a normal mood and affect. Her behavior is normal. Judgment and thought content normal.    ED Course  Procedures (including critical care time)  Labs Reviewed  CBC WITH DIFFERENTIAL - Abnormal; Notable for the following:    RBC 5.62 (*)    Hemoglobin 15.3 (*)    MCV 76.7 (*)    Lymphocytes Relative 50 (*)    Lymphs Abs 4.4 (*)    All other components within normal limits  URINALYSIS, ROUTINE W REFLEX MICROSCOPIC - Abnormal; Notable for the following:    Glucose, UA >1000 (*)    Ketones, ur 40 (*)    All other components within normal limits  COMPREHENSIVE METABOLIC PANEL - Abnormal; Notable for the following:    Glucose, Bld 371 (*)    Albumin 3.3 (*)    All other components within normal limits  GLUCOSE, CAPILLARY - Abnormal; Notable for the following:    Glucose-Capillary 366 (*)    All other components within normal limits  GLUCOSE, CAPILLARY - Abnormal; Notable for the following:    Glucose-Capillary 300 (*)    All other components within normal limits  GLUCOSE, CAPILLARY - Abnormal; Notable for the following:    Glucose-Capillary 251 (*)    All other components within normal limits  POCT I-STAT, CHEM 8 - Abnormal; Notable for the following:    Glucose, Bld 379 (*)    Calcium, Ion 1.06 (*)    Hemoglobin 15.6 (*)    All other components within normal limits  URINE MICROSCOPIC-ADD ON  TSH  POCT PREGNANCY, URINE   No results found.   Date: 11/05/2012  Rate: 82 bpm  Rhythm: normal sinus rhythm  QRS Axis: normal  Intervals: normal  ST/T Wave abnormalities: nonspecific ST changes  Conduction Disutrbances:none  Narrative Interpretation: Normal sinus rhythm without evidence of acute ischemia  Old EKG Reviewed: unchanged   1. Poorly  controlled diabetes mellitus   2. Hypertension   3. Eschar of foot   4. Anxiety     MDM  52 yo F presents for anxiety, elevated blood glucose, and diabetic foot ulcers on bilateral toes. Pt has been seen in ED twice in past 10 days since being in the [place] from [place] for same symptoms. POCT glucose 459 at urgent care PTA. VSS here and asymptomatic except anxiety. Non-focal neuro exam. Diabetic eschars on toes do not appear to be acutely infected; no erythema, edema, warmth, or drainage. Hyperglycemia treated in ED with correction to the mid 200's. I spoke with Child psychotherapist and Case Advice worker; patient given instructions to follow-up with a PCP-contact information for obtaining  a PCP provided. Patient instructed to use already prescribed insulin and anti-hypertensive medications. Patient also instructed that TSH/T4 were obtained (hx of hypothyroidism, unknown dose she had been taking in [place]). given return precautions, including worsening of signs or symptoms. Patient instructed to follow-up with primary care physician and podiatrist.          Clemetine Marker, MD 11/05/12 [phone]

## 2012-11-05 NOTE — ED Notes (Signed)
Pt overly anxious and tachypneic during assessment.  Per family member at bedside who is translating pt is compliant with insulin but has not made dietary changes for diabetes.  Pt also has not filled prescription for xanax that was prescribed during previous visit.  Pt has been having anxiety attacks for some time now and per family they have gotten worse since pt came to [place].

## 2012-11-05 NOTE — ED Provider Notes (Signed)
History     CSN: [id number]  Arrival date & time 11/05/12  1534   None     Chief Complaint  Patient presents with  . Hyperglycemia    (Consider location/radiation/quality/duration/timing/severity/associated sxs/prior treatment) Patient is a 52 y.o. female presenting with anxiety. The history is provided by a relative.  Anxiety This is a chronic problem. The current episode started 1 to 2 hours ago. The problem has been rapidly worsening. Associated symptoms include abdominal pain. Pertinent negatives include no chest pain. The symptoms are aggravated by stress.    Past Medical History  Diagnosis Date  . Diabetes mellitus without complication   . Hypertension   . Thyroid disease     hypothyroid    History reviewed. No pertinent past surgical history.  History reviewed. No pertinent family history.  History  Substance Use Topics  . Smoking status: Never Smoker   . Smokeless tobacco: Not on file  . Alcohol Use: No    OB History   Grav Para Term Preterm Abortions TAB SAB Ect Mult Living                  Review of Systems  Cardiovascular: Negative for chest pain.  Gastrointestinal: Positive for abdominal pain.  Psychiatric/Behavioral: Positive for agitation. The patient is nervous/anxious.     Allergies  Review of patient's allergies indicates no known allergies.  Home Medications   Current Outpatient Rx  Name  Route  Sig  Dispense  Refill  . ALPRAZolam (XANAX) 0.5 MG tablet   Oral   Take 1 tablet (0.5 mg total) by mouth 3 (three) times daily as needed for anxiety.   30 tablet   0   . atenolol (TENORMIN) 100 MG tablet   Oral   Take 100 mg by mouth daily.         Marland Kitchen glimepiride (AMARYL) 4 MG tablet   Oral   Take 8 mg by mouth 2 (two) times daily.         Marland Kitchen glucose blood test strip      Use as instructed   100 each   12   . insulin NPH-regular (NOVOLIN 70/30) (70-30) 100 UNIT/ML injection   Subcutaneous   Inject 25-30 Units into the skin 2  (two) times daily. Take 30 units every morning and 25 units at dinner. Refill x 1.   10 mL   12     BP 163/96  Pulse 110  Resp 24  SpO2 100%  Physical Exam  Nursing note and vitals reviewed. Constitutional: She appears well-developed and well-nourished. She appears distressed.  Eyes: Conjunctivae are normal. Pupils are equal, round, and reactive to light.  Neck: Normal range of motion. Neck supple.  Cardiovascular: Regular rhythm and normal pulses.  Tachycardia present.   Pulmonary/Chest: Effort normal.  Abdominal: Soft. Bowel sounds are normal.  Skin: She is diaphoretic.  Psychiatric:  Hyperventilation, clammy, min nonverbal responses, family is able to communicate in arabic.    ED Course  Procedures (including critical care time)  Labs Reviewed  GLUCOSE, CAPILLARY - Abnormal; Notable for the following:    Glucose-Capillary 459 (*)    All other components within normal limits   No results found.   No diagnosis found.    MDM  Sent for acute hyperventilation anxiety episode , 2 recent ER visits 4/18 and 4/23, since being in [place] for only 10 days. Uncontrolled dm.        Linna Hoff, MD 11/05/12 [phone]

## 2012-11-05 NOTE — ED Notes (Signed)
Pt transferred to bed in [facility] from [facility] for spot to lay down; family at [facility]

## 2012-11-05 NOTE — ED Provider Notes (Signed)
I saw and evaluated the patient, reviewed the resident's note and I agree with the findings and plan.  Recurring ED visits for the same, h/o DM and anxiety, recently arrived here.  Pt improved with benzo's, given IVF's, and hyperglycemia improved.  Case management aware, not able to assist much.  Pt encouraged again to establish with a PCP in the area.    ECG at time 17:54 shows SR at rate 82, LAFB, non specific T wave abn's, LAD.   Abn ECG.  No change from ECG on 10/31/12.   Impressino: Anxiety Hyperglycemia Abn, but unchanged ECG  Debbie [name]. Oletta Lamas, MD 11/05/12 2238

## 2012-11-05 NOTE — ED Notes (Signed)
Asked pt if she was able to void for [place].  Pt stated she did not need to go to restroom at the moment.

## 2012-11-06 LAB — TSH: TSH: 1.837 u[IU]/mL (ref 0.350–4.500)

## 2012-11-06 NOTE — Progress Notes (Signed)
   CARE MANAGEMENT ED NOTE 11/06/2012  Patient:  CHALET, KERWIN   Account Number:  [id number]  Date Initiated:  11/06/2012  Documentation initiated by:  Fransico Michael  Subjective/Objective Assessment:   presented to ED again last night with hyperglycemia and panic attack     Subjective/Objective Assessment Detail:     Action/Plan:   Action/Plan Detail:   Anticipated DC Date:  11/05/2012     Status Recommendation to Physician:   Result of Recommendation:        Choice offered to / List presented to:            Status of service:    ED Comments:   ED Comments Detail:  11/06/12-1403-J.Yutaka Holberg,RN,BSN [phone]      Received referral on patient who was  here last night. Reviewed notes. Called Lindell Spar, Director of Urgent Care regarding yesterday's events. Reported that patient was there at Urgent care center yesterday to establish PCP care in [facility]. Patient arrived and was hyperventilating, pale, diaphoretic, and hyperglycemia and was sent by [facility] to the ED. Called patient at home [phone]. Unable to understand female that answered phone. Spoke to female family member and recommended that patient make another appointment at Adult care clinic [phone]. Voiced understanding. No further needs identified.

## 2012-11-09 ENCOUNTER — Emergency Department (HOSPITAL_COMMUNITY): Payer: Medicaid Other

## 2012-11-09 ENCOUNTER — Emergency Department (HOSPITAL_COMMUNITY)
Admission: EM | Admit: 2012-11-09 | Discharge: 2012-11-09 | Disposition: A | Discharge status: Skilled Nursing Facility | Payer: Medicaid Other | Source: Home / Self Care

## 2012-11-09 ENCOUNTER — Encounter (HOSPITAL_COMMUNITY): Payer: Self-pay | Admitting: Emergency Medicine

## 2012-11-09 ENCOUNTER — Encounter (HOSPITAL_COMMUNITY): Payer: Self-pay

## 2012-11-09 ENCOUNTER — Inpatient Hospital Stay (HOSPITAL_COMMUNITY)
Admission: EM | Admit: 2012-11-09 | Discharge: 2012-11-13 | DRG: 300 | Disposition: A | Discharge status: Home / Self Care | Payer: Medicaid Other | Attending: Family Medicine | Admitting: Family Medicine

## 2012-11-09 DIAGNOSIS — Z7982 Long term (current) use of aspirin: Secondary | ICD-10-CM

## 2012-11-09 DIAGNOSIS — Z794 Long term (current) use of insulin: Secondary | ICD-10-CM

## 2012-11-09 DIAGNOSIS — N390 Urinary tract infection, site not specified: Secondary | ICD-10-CM | POA: Diagnosis present

## 2012-11-09 DIAGNOSIS — E1159 Type 2 diabetes mellitus with other circulatory complications: Principal | ICD-10-CM | POA: Diagnosis present

## 2012-11-09 DIAGNOSIS — F4323 Adjustment disorder with mixed anxiety and depressed mood: Secondary | ICD-10-CM

## 2012-11-09 DIAGNOSIS — E119 Type 2 diabetes mellitus without complications: Secondary | ICD-10-CM

## 2012-11-09 DIAGNOSIS — I10 Essential (primary) hypertension: Secondary | ICD-10-CM | POA: Diagnosis present

## 2012-11-09 DIAGNOSIS — Z6836 Body mass index (BMI) 36.0-36.9, adult: Secondary | ICD-10-CM

## 2012-11-09 DIAGNOSIS — E1151 Type 2 diabetes mellitus with diabetic peripheral angiopathy without gangrene: Secondary | ICD-10-CM | POA: Diagnosis present

## 2012-11-09 DIAGNOSIS — R739 Hyperglycemia, unspecified: Secondary | ICD-10-CM

## 2012-11-09 DIAGNOSIS — R0602 Shortness of breath: Secondary | ICD-10-CM

## 2012-11-09 DIAGNOSIS — E1149 Type 2 diabetes mellitus with other diabetic neurological complication: Secondary | ICD-10-CM | POA: Diagnosis present

## 2012-11-09 DIAGNOSIS — I519 Heart disease, unspecified: Secondary | ICD-10-CM | POA: Diagnosis present

## 2012-11-09 DIAGNOSIS — R5381 Other malaise: Secondary | ICD-10-CM

## 2012-11-09 DIAGNOSIS — L97509 Non-pressure chronic ulcer of other part of unspecified foot with unspecified severity: Secondary | ICD-10-CM | POA: Diagnosis present

## 2012-11-09 DIAGNOSIS — E1142 Type 2 diabetes mellitus with diabetic polyneuropathy: Secondary | ICD-10-CM | POA: Diagnosis present

## 2012-11-09 DIAGNOSIS — R0682 Tachypnea, not elsewhere classified: Secondary | ICD-10-CM

## 2012-11-09 DIAGNOSIS — E669 Obesity, unspecified: Secondary | ICD-10-CM | POA: Diagnosis present

## 2012-11-09 DIAGNOSIS — R079 Chest pain, unspecified: Secondary | ICD-10-CM

## 2012-11-09 DIAGNOSIS — R531 Weakness: Secondary | ICD-10-CM

## 2012-11-09 DIAGNOSIS — F419 Anxiety disorder, unspecified: Secondary | ICD-10-CM

## 2012-11-09 DIAGNOSIS — R109 Unspecified abdominal pain: Secondary | ICD-10-CM

## 2012-11-09 DIAGNOSIS — R064 Hyperventilation: Secondary | ICD-10-CM

## 2012-11-09 DIAGNOSIS — I739 Peripheral vascular disease, unspecified: Secondary | ICD-10-CM

## 2012-11-09 DIAGNOSIS — I798 Other disorders of arteries, arterioles and capillaries in diseases classified elsewhere: Secondary | ICD-10-CM | POA: Diagnosis present

## 2012-11-09 HISTORY — DX: Type 2 diabetes mellitus with hyperglycemia: E11.65

## 2012-11-09 HISTORY — DX: Reserved for concepts with insufficient information to code with codable children: IMO0002

## 2012-11-09 HISTORY — DX: Obesity, unspecified: E66.9

## 2012-11-09 HISTORY — DX: Hypothyroidism, unspecified: E03.9

## 2012-11-09 HISTORY — DX: Type 2 diabetes mellitus with foot ulcer: E11.621

## 2012-11-09 HISTORY — DX: Non-pressure chronic ulcer of other part of unspecified foot with unspecified severity: L97.509

## 2012-11-09 HISTORY — DX: Chest pain, unspecified: R07.9

## 2012-11-09 HISTORY — DX: Type 2 diabetes mellitus with unspecified complications: E11.8

## 2012-11-09 LAB — BASIC METABOLIC PANEL
BUN: 13 mg/dL (ref 6–23)
Chloride: 99 mEq/L (ref 96–112)
Creatinine, Ser: 0.63 mg/dL (ref 0.50–1.10)
GFR calc Af Amer: >90 mL/min (ref >90)

## 2012-11-09 LAB — CBC WITH DIFFERENTIAL/PLATELET
Basophils Relative: 1 % (ref 0–1)
HCT: 43.3 % (ref 36.0–46.0)
Hemoglobin: 15.4 g/dL — ABNORMAL HIGH (ref 12.0–15.0)
MCH: 27.4 pg (ref 26.0–34.0)
MCHC: 35.6 g/dL (ref 30.0–36.0)
Monocytes Absolute: 0.3 10*3/uL (ref 0.1–1.0)
Monocytes Relative: 3 % (ref 3–12)
Neutro Abs: 4.7 10*3/uL (ref 1.7–7.7)

## 2012-11-09 LAB — URINALYSIS, ROUTINE W REFLEX MICROSCOPIC
Bilirubin Urine: NEGATIVE
Glucose, UA: >1000 mg/dL — AB
Hgb urine dipstick: NEGATIVE
Ketones, ur: 40 mg/dL — AB
pH: 7 (ref 5.0–8.0)

## 2012-11-09 LAB — GLUCOSE, CAPILLARY
Glucose-Capillary: 479 mg/dL — ABNORMAL HIGH (ref 70–99)
Glucose-Capillary: 312 mg/dL — ABNORMAL HIGH (ref 70–99)

## 2012-11-09 LAB — POCT PREGNANCY, URINE: Preg Test, Ur: NEGATIVE

## 2012-11-09 LAB — POCT I-STAT 3, VENOUS BLOOD GAS (G3P V)
Bicarbonate: 21.8 mEq/L (ref 20.0–24.0)
O2 Saturation: 70 %
TCO2: 23 mmol/L (ref 0–100)
pCO2, Ven: 26.7 mmHg — ABNORMAL LOW (ref 45.0–50.0)
pO2, Ven: 32 mmHg (ref 30.0–45.0)

## 2012-11-09 LAB — TROPONIN I: Troponin I: <0.3 ng/mL (ref <0.30)

## 2012-11-09 LAB — URINE MICROSCOPIC-ADD ON

## 2012-11-09 MED ORDER — SODIUM CHLORIDE 0.9 % IJ SOLN
3.0000 mL | Freq: Two times a day (BID) | INTRAMUSCULAR | Status: DC
  Administered 2012-11-10 – 2012-11-12 (×6): 3 mL via INTRAVENOUS

## 2012-11-09 MED ORDER — ALPRAZOLAM 0.5 MG PO TABS
0.5000 mg | ORAL_TABLET | Freq: Three times a day (TID) | ORAL | Status: DC | PRN
  Administered 2012-11-10: 0.5 mg via ORAL
  Filled 2012-11-09: qty 1

## 2012-11-09 MED ORDER — INSULIN REGULAR HUMAN 100 UNIT/ML IJ SOLN
INTRAMUSCULAR | Status: DC
  Administered 2012-11-09: 2.9 [IU]/h via INTRAVENOUS
  Filled 2012-11-09: qty 1

## 2012-11-09 MED ORDER — SODIUM CHLORIDE 0.9 % IV BOLUS (SEPSIS)
1000.0000 mL | Freq: Once | INTRAVENOUS | Status: AC
  Administered 2012-11-09: 1000 mL via INTRAVENOUS

## 2012-11-09 MED ORDER — LORAZEPAM 2 MG/ML IJ SOLN
1.0000 mg | Freq: Once | INTRAMUSCULAR | Status: AC
  Administered 2012-11-09: 1 mg via INTRAVENOUS
  Filled 2012-11-09: qty 1

## 2012-11-09 MED ORDER — HEPARIN SODIUM (PORCINE) 5000 UNIT/ML IJ SOLN
5000.0000 [IU] | Freq: Three times a day (TID) | INTRAMUSCULAR | Status: DC
  Administered 2012-11-09 – 2012-11-13 (×10): 5000 [IU] via SUBCUTANEOUS
  Filled 2012-11-09 (×14): qty 1

## 2012-11-09 MED ORDER — INSULIN ASPART 100 UNIT/ML ~~LOC~~ SOLN
0.0000 [IU] | SUBCUTANEOUS | Status: DC
  Administered 2012-11-10: 5 [IU] via SUBCUTANEOUS
  Administered 2012-11-10: 3 [IU] via SUBCUTANEOUS
  Administered 2012-11-10 (×2): 2 [IU] via SUBCUTANEOUS
  Administered 2012-11-10 (×2): 3 [IU] via SUBCUTANEOUS
  Administered 2012-11-11: 5 [IU] via SUBCUTANEOUS
  Administered 2012-11-11: 3 [IU] via SUBCUTANEOUS
  Administered 2012-11-11: 2 [IU] via SUBCUTANEOUS
  Administered 2012-11-11: 3 [IU] via SUBCUTANEOUS

## 2012-11-09 MED ORDER — ASPIRIN 81 MG PO CHEW
324.0000 mg | CHEWABLE_TABLET | Freq: Once | ORAL | Status: AC
  Administered 2012-11-09: 324 mg via ORAL
  Filled 2012-11-09: qty 4

## 2012-11-09 MED ORDER — ATENOLOL 100 MG PO TABS
100.0000 mg | ORAL_TABLET | Freq: Every day | ORAL | Status: DC
  Administered 2012-11-10 – 2012-11-11 (×2): 100 mg via ORAL
  Filled 2012-11-09 (×2): qty 1

## 2012-11-09 NOTE — ED Provider Notes (Signed)
I saw and evaluated the patient, reviewed the resident's note and I agree with the findings and plan.   .Face to face Exam:  General:  Awake HEENT:  Atraumatic Resp:  Normal effort Abd:  Nondistended Neuro:No focal weakness Lymph: No adenopathy   Meds ordered this encounter  Medications  . sodium chloride 0.9 % bolus 1,000 mL    Sig:   . LORazepam (ATIVAN) injection 1 mg    Sig:   . DISCONTD: insulin regular (NOVOLIN R,HUMULIN R) 1 Units/mL in sodium chloride 0.9 % 100 mL infusion    Sig:     Order Specific Question:  GlucoStabilizer low target:    Answer:  140    Order Specific Question:  GlucoStabilizer high target:    Answer:  180    Order Specific Question:  Forensic scientist:    Answer:  0.01  . sodium chloride 0.9 % bolus 1,000 mL    Sig:   . aspirin chewable tablet 324 mg    Sig:   . ALPRAZolam (XANAX) tablet 0.5 mg    Sig:   . atenolol (TENORMIN) tablet 100 mg    Sig:   . heparin injection 5,000 Units    Sig:   . sodium chloride 0.9 % injection 3 mL    Sig:   . insulin aspart (novoLOG) injection 0-15 Units    Sig:     Order Specific Question:  Correction coverage:    Answer:  Moderate (average weight, post-op)    Order Specific Question:  CBG < 70:    Answer:  implement hypoglycemia protocol    Order Specific Question:  CBG 70 - 120:    Answer:  0 units    Order Specific Question:  CBG 121 - 150:    Answer:  2 units    Order Specific Question:  CBG 151 - 200:    Answer:  3 units    Order Specific Question:  CBG 201 - 250:    Answer:  5 units    Order Specific Question:  CBG 251 - 300:    Answer:  8 units    Order Specific Question:  CBG 301 - 350:    Answer:  11 units    Order Specific Question:  CBG 351 - 400:    Answer:  15 units    Order Specific Question:  CBG > 400    Answer:  call MD and obtain STAT lab verification  CRITICAL CARE Performed by: Nelva Nay L   Total critical care time: 30 min  Critical care time was exclusive  of separately billable procedures and treating other patients.  Critical care was necessary to treat or prevent imminent or life-threatening deterioration.  Critical care was time spent personally by me on the following activities: development of treatment plan with patient and/or surrogate as well as nursing, discussions with consultants, evaluation of patient's response to treatment, examination of patient, obtaining history from patient or surrogate, ordering and performing treatments and interventions, ordering and review of laboratory studies, ordering and review of radiographic studies, pulse oximetry and re-evaluation of patient's condition.   Nelia Shi, MD 11/09/12 [phone]

## 2012-11-09 NOTE — BH Assessment (Signed)
Assessment Note   Debbie [name] is an 52 y.o. female brought in by her sister.  This patient was referred to [facility] due to repeated visits to the ED since her arrival in [place] from [place] on April 18.  Her sister reports she has had poor health for the last 4 years-she has difficulty walking due to weakness, she reports difficulty breathing, and is struggling to maintain her blood sugar at a healthy level.  The EDP consulted this writer because she was not accepted to amedical floor and he was concerned that a number of her symptoms were somatic.  With the help of an interpreter, the patient denied, SI-thought, plan, or intent now or in the last six months, and HI-thought plan, or intent now or in the last six months adn AVH.  It is unclear whether her symptoms are somatic or based on medical condition.  She did admit to trauma related to her [place] and grief over her mother's death four years ago.  This Clinical research associate discussed the findings with the EDP who reported the patient is now being admitted to a medical bed.    Axis I: Mood Disorder NOS Axis II: Deferred Axis III:  Past Medical History  Diagnosis Date  . Type II or unspecified type diabetes mellitus with unspecified complication, uncontrolled   . Hypertension   . Thyroid disease     hypothyroid  . Heart attack 2010  . Diabetic foot ulcer    Axis IV: problems with access to health care services Axis V: 51-60 moderate symptoms  Past Medical History:  Past Medical History  Diagnosis Date  . Type II or unspecified type diabetes mellitus with unspecified complication, uncontrolled   . Hypertension   . Thyroid disease     hypothyroid  . Heart attack 2010  . Diabetic foot ulcer     Past Surgical History  Procedure Laterality Date  . Back surgery      Family History: No family history on file.  Social History:  reports that she has never smoked. She does not have any smokeless tobacco history on file. She reports that  she does not drink alcohol or use illicit drugs.  Additional Social History:  Alcohol / Drug Use History of alcohol / drug use?: No history of alcohol / drug abuse  CIWA: CIWA-Ar BP: 127/64 mmHg Pulse Rate: 86 COWS:    Allergies:  Allergies  Allergen Reactions  . Penicillins Rash    Home Medications:  (Not in a hospital admission)  OB/GYN Status:  No LMP recorded. Patient is postmenopausal.  General Assessment Data Location of Assessment: [facility] ED Living Arrangements: Other relatives (sister) Can pt return to current living arrangement?: Yes Admission Status: Voluntary Is patient capable of signing voluntary admission?: Yes Transfer from: Acute Hospital Referral Source: Self/Family/Friend  Education Status Is patient currently in school?: No  Risk to self Suicidal Ideation: No Suicidal Intent: No Is patient at risk for suicide?: No Suicidal Plan?: No Access to Means: No Previous Attempts/Gestures: No Intentional Self Injurious Behavior: None Family Suicide History: No Recent stressful life event(s): Trauma (Comment);Turmoil (Comment);Other (Comment) (move from [place]) Persecutory voices/beliefs?: No Depression: Yes Depression Symptoms: Insomnia;Loss of interest in usual pleasures;Isolating;Tearfulness;Despondent Substance abuse history and/or treatment for substance abuse?: No Suicide prevention information given to non-admitted patients: Not applicable  Risk to Others Homicidal Ideation: No Thoughts of Harm to Others: No Current Homicidal Intent: No Current Homicidal Plan: No Access to Homicidal Means: No History of harm  to others?: No Assessment of Violence: None Noted Does patient have access to weapons?: No Criminal Charges Pending?: No Does patient have a court date: No  Psychosis Hallucinations: None noted Delusions: Somatic (possible somatic)  Mental Status Report Appear/Hygiene: Disheveled Eye Contact: Poor Motor Activity:  Mannerisms;Restlessness;Rigidity Speech: Language other than English Level of Consciousness: Alert Mood: Depressed;Anxious Affect: Unable to Assess Anxiety Level: Moderate Judgement: Unimpaired Orientation: Person;Place;Time;Situation Obsessive Compulsive Thoughts/Behaviors: Moderate  Cognitive Functioning Concentration: Decreased Memory: Recent Intact;Remote Intact IQ: Average Insight: Fair Impulse Control: Good Appetite: Fair Sleep: Decreased Total Hours of Sleep:  (not sleeping due to pain) Vegetative Symptoms: None  ADLScreening [facility] Assessment Services) Patient's cognitive ability adequate to safely complete daily activities?: Yes Patient able to express need for assistance with ADLs?: Yes Independently performs ADLs?: Yes (appropriate for developmental age)  Abuse/Neglect [facility]) Physical Abuse: Denies Verbal Abuse: Denies Sexual Abuse: Denies  Prior Inpatient Therapy Prior Inpatient Therapy: No  Prior Outpatient Therapy Prior Outpatient Therapy: No  ADL Screening (condition at time of admission) Patient's cognitive ability adequate to safely complete daily activities?: Yes Patient able to express need for assistance with ADLs?: Yes Independently performs ADLs?: Yes (appropriate for developmental age) Weakness of Legs: Both       Abuse/Neglect Assessment (Assessment to be complete while patient is alone) Physical Abuse: Denies Verbal Abuse: Denies Sexual Abuse: Denies Exploitation of patient/patient's resources: Denies Values / Beliefs Cultural Requests During Hospitalization: None Spiritual Requests During Hospitalization: None   Advance Directives (For Healthcare) Advance Directive: Patient does not have advance directive    Additional Information 1:1 In Past 12 Months?: No CIRT Risk: No Elopement Risk: No Does patient have medical clearance?: No     Disposition:  Disposition Initial Assessment Completed for this Encounter: Yes Disposition  of Patient: Other dispositions (medical admit)  On Site Evaluation by:   Reviewed with Physician:     Steward Ros 11/09/2012 10:36 PM

## 2012-11-09 NOTE — ED Notes (Signed)
Lab at bedside

## 2012-11-09 NOTE — ED Notes (Signed)
Per pt's family member,  Pt c/o right sided flank pain and chronic HA. Pt in nad, skin warm and dry, resp equal.

## 2012-11-09 NOTE — ED Notes (Signed)
Hospital follow up Patient was recently discharged from [facility] ED with diagnosis  Of anxiety. Patient has a history  DM HTN Hypothyroid

## 2012-11-09 NOTE — Progress Notes (Signed)
Patient Demographics  Debbie [name], is a 52 y.o. female  ZOX:[id number]  WJX:[id number]  DOB - [birth year]  Chief Complaint  Patient presents with  . Follow-up        Subjective:   Debbie [name] today is here to establish primary care. Patient is a 52 year old Arabic female from [place] who immigrated  on 10/26/12. She has a past medical history of uncontrolled diabetes, hypertension and dyslipidemia. She does not speak any English, however her sister is translating and is at bedside. Apparently patient has been deteriorating and has severe generalized weakness for the past few years worse recently. Per family, she is dependent on them 24/7 for all activities of daily living. Family claims that she is almost nonambulatory- and at best as can take 1 or 2 steps. Even while in [place], the family claims that she required almost weekly visits to the ED and had numerous hospitalizations.  She has alreadybeen seen 3 times in the emergency room, and was scheduled for a routine followup visit. During my evaluation, patient appears very tachypneic and was crying, family claims they've been giving her insulin as prescribed however her sugars here in the clinic is approximately 460. Patient also claims to have some upper abdominal and lower chest discomfort bilaterally. Family claims that the patient has been having excessive thirst and excessive urination over the past few days. There is no history of fever. There is no history of nausea, vomiting or diarrhea.   Objective:    Filed Vitals:   11/09/12 1511  BP: 156/93  Pulse: 88  Temp: 97.7 F (36.5 C)  TempSrc: Oral  Resp: 19  SpO2: 100%     ALLERGIES:   Allergies  Allergen Reactions  . Penicillins Rash    PAST MEDICAL HISTORY: Past Medical History  Diagnosis Date  . Diabetes mellitus without complication   . Hypertension   . Thyroid disease     hypothyroid    PAST SURGICAL HISTORY: History reviewed. No pertinent past surgical  history.  FAMILY HISTORY: No family history of coronary artery disease   MEDICATIONS AT HOME: Prior to Admission medications   Medication Sig Start Date End Date Taking? Authorizing Provider  ALPRAZolam Prudy Feeler) 0.5 MG tablet Take 1 tablet (0.5 mg total) by mouth 3 (three) times daily as needed for anxiety. 10/31/12   Jamesetta Orleans Lawyer, PA-C  atenolol (TENORMIN) 100 MG tablet Take 100 mg by mouth daily.    Historical Provider, MD  glimepiride (AMARYL) 4 MG tablet Take 8 mg by mouth daily before breakfast.     Historical Provider, MD  glucose blood test strip Use as instructed 10/26/12   Antony Madura, PA-C  insulin NPH-regular (NOVOLIN 70/30) (70-30) 100 UNIT/ML injection Inject 25-30 Units into the skin 2 (two) times daily. Take 30 units every morning and 25 units at dinner. Refill x 1. 10/26/12   Antony Madura, PA-C    REVIEW OF SYSTEMS:  Constitutional:    tachypneic anxious appearing middle-aged female. Crying  HEENT:    No headaches, Sore throat,   Cardio-vascular: No chest pain,  Orthopnea, swelling in lower extremities, anasarca, palpitations  GI:  No abdominal pain, nausea, vomiting, diarrhea  Resp: No shortness of breath,  No coughing up of blood.No cough.No wheezing.  Skin:  no rash or lesions.  GU:  no dysuria, change in color of urine, no urgency or frequency.  No flank pain.  Musculoskeletal: No joint pain or swelling.  No decreased range of motion.  No back pain.  Psych:  No memory loss.   Exam  General appearance : lethargic but Awake and alert.Mildly tachypneic, speech clear but slow.  HEENT: Atraumatic and Normocephalic, pupils equally reactive to light and accomodation Neck: supple, no JVD. No cervical lymphadenopathy.  Chest:Good air entry bilaterally, no added sounds  CVS: S1 S2 regular, no murmurs.  Abdomen: Bowel sounds present, Non tender and not distended with no gaurding, rigidity or rebound. Extremities: B/L Lower Ext shows no edema, both legs  are warm to touch dry eschar seen in some of her right toes.  Neurology: Awake alert, and oriented X 3, CN II-XII intact, Non focal Skin:No Rash Wounds:N/A    Data Review   CBC  Recent Labs Lab 11/05/12 1800 11/05/12 1804  WBC 8.8  --   HGB 15.3* 15.6*  HCT 43.1 46.0  PLT 222  --   MCV 76.7*  --   MCH 27.2  --   MCHC 35.5  --   RDW 13.2  --   LYMPHSABS 4.4*  --   MONOABS 0.4  --   EOSABS 0.1  --   BASOSABS 0.1  --     Chemistries    Recent Labs Lab 11/05/12 1800 11/05/12 1804  NA 136 137  K 4.5 4.3  CL 99 105  CO2 23  --   GLUCOSE 371* 379*  BUN 14 15  CREATININE 0.61 0.50  CALCIUM 9.6  --   AST 21  --   ALT 31  --   ALKPHOS 97  --   BILITOT 0.5  --    ------------------------------------------------------------------------------------------------------------------ No results found for this basename: HGBA1C,  in the last 72 hours ------------------------------------------------------------------------------------------------------------------ No results found for this basename: CHOL, HDL, LDLCALC, TRIG, CHOLHDL, LDLDIRECT,  in the last 72 hours ------------------------------------------------------------------------------------------------------------------ No results found for this basename: TSH, T4TOTAL, FREET3, T3FREE, THYROIDAB,  in the last 72 hours ------------------------------------------------------------------------------------------------------------------ No results found for this basename: VITAMINB12, FOLATE, FERRITIN, TIBC, IRON, RETICCTPCT,  in the last 72 hours  Coagulation profile  No results found for this basename: INR, PROTIME,  in the last 168 hours    Assessment & Plan   Active Problems: Uncontrolled diabetes - History patient appears symptomatic with polydipsia and polyuria- CBGs close to 480 - She also appears very dry on exam - Needs transfer to the emergency room for further evaluation and possible inpatient admission-as  the patient has had multiple evaluations here at the urgent care and numerous ED visits in the past 3 weeks already.   Tachypnea  - Reported a history of anxiety - lungs are clear  - She did recently travel from [place] to the [place]-CTA chest on 4/23 negative for pulmonary embolism  - May need a chest x-ray while in the emergency room    Diabetic foot - May need further evaluation if admitted to the hospital - Will need ABIs at some point  Generalized weakness - Per family patient is dependent on them 24/7 activities of daily living- apparently patient has been declining for the past few years and was in an almost identical situation and [place] as well. - She has generalized weakness of unknown etiology-? Psychogenic vs organic- clearly needs further workup- would likely need inpatient admission  Anxiety/PTSD - If further workup does not reveal any organic basis for generalized weakness-she may need a psychiatric evaluation.  I have asked R.N. here to place IV line transfer the patient to the emergency room via [facility].    Follow-up Information   Follow up with [facility].  Schedule an appointment as soon as possible for a visit in 2 weeks.

## 2012-11-09 NOTE — ED Provider Notes (Signed)
History     CSN: [id number]  Arrival date & time 11/09/12  1648   First MD Initiated Contact with Patient 11/09/12 1655      No chief complaint on file.   (Consider location/radiation/quality/duration/timing/severity/associated sxs/prior treatment) HPI Comments: 17 y F with PMH of IDDM complicated by bilateral diabetic foot ulcers with 4 ED visits since coming to the [place] 4/18 as a refugee from [place].  All of her presentations have been very similar and have involved moaning, poor interaction on exam and hyperventilation.  She has been hyperglycemic each time, but never in DKA.  Her workup has included multiple CBCs/chem panels, 2 UAs (hematuria), nl CT head, negative CT PE study, negative CXR x2, negative trop, neg UPTs, normal TSH and negative foot xrays.  She has been seen by social work and by the Sports coach and has had f/u arranged with a PCP.  She was there today when the pt began exhibiting these symptoms so they transferred her back to the ED.  Her sister reports that the pt has been c/o a HA since her arrival from [place].  She also reports the pt has had R flank pain for 3 months and has had nausea, but no vomiting recently.   Patient is a 52 y.o. female presenting with headaches. The history is provided by a relative.  Headache Pain location:  Frontal Radiates to:  Does not radiate Onset quality:  Gradual Progression:  Waxing and waning Chronicity:  Recurrent Associated symptoms: nausea   Associated symptoms: no abdominal pain, no diarrhea, no fever, no photophobia and no vomiting     Past Medical History  Diagnosis Date  . Diabetes mellitus without complication   . Hypertension   . Thyroid disease     hypothyroid    History reviewed. No pertinent past surgical history.  No family history on file.  History  Substance Use Topics  . Smoking status: Never Smoker   . Smokeless tobacco: Not on file  . Alcohol Use: No    OB History   Grav Para Term Preterm Abortions TAB  SAB Ect Mult Living                 ROS obtained from sister Review of Systems  Constitutional: Negative for fever.  Eyes: Negative for photophobia and visual disturbance.  Respiratory: Positive for shortness of breath.   Gastrointestinal: Positive for nausea. Negative for vomiting, abdominal pain and diarrhea.  Endocrine: Negative for cold intolerance and heat intolerance.  Genitourinary: Positive for flank pain. Negative for dysuria, vaginal bleeding and vaginal discharge.  Neurological: Positive for headaches.  All other systems reviewed and are negative.    Allergies  Penicillins  Home Medications   Current Outpatient Rx  Name  Route  Sig  Dispense  Refill  . ALPRAZolam (XANAX) 0.5 MG tablet   Oral   Take 1 tablet (0.5 mg total) by mouth 3 (three) times daily as needed for anxiety.   30 tablet   0   . atenolol (TENORMIN) 100 MG tablet   Oral   Take 100 mg by mouth daily.         Marland Kitchen glimepiride (AMARYL) 4 MG tablet   Oral   Take 8 mg by mouth daily before breakfast.          . glucose blood test strip      Use as instructed   100 each   12   . insulin NPH-regular (NOVOLIN 70/30) (70-30) 100 UNIT/ML injection  Subcutaneous   Inject 25-30 Units into the skin 2 (two) times daily. Take 30 units every morning and 25 units at dinner. Refill x 1.   10 mL   12     BP 150/90  Pulse 85  Temp(Src) 97.5 F (36.4 C) (Oral)  Resp 30  SpO2 100%  Physical Exam  Vitals reviewed. Constitutional: She appears well-developed and well-nourished.  Moaning, blanket over her eyes  HENT:  Right Ear: External ear normal.  Left Ear: External ear normal.  Mouth/Throat: No oropharyngeal exudate.  Eyes: Conjunctivae and EOM are normal. Pupils are equal, round, and reactive to light.  Neck: Normal range of motion. Neck supple.  Cardiovascular: Normal rate, regular rhythm, normal heart sounds and intact distal pulses.  Exam reveals no gallop and no friction rub.   No  murmur heard. Pulmonary/Chest: Effort normal and breath sounds normal.  Abdominal: Soft. Bowel sounds are normal. She exhibits no distension. There is no tenderness.  Musculoskeletal: Normal range of motion. She exhibits no edema.       Feet:  Neurological: She is alert.  Moves all four extremities, does not cooperate well with exam  Skin: Skin is warm and dry. No rash noted.  Psychiatric: She has a normal mood and affect.    ED Course  Procedures (including critical care time)  Labs Reviewed  CBC WITH DIFFERENTIAL - Abnormal; Notable for the following:    RBC 5.62 (*)    Hemoglobin 15.4 (*)    MCV 77.0 (*)    Lymphs Abs 4.1 (*)    All other components within normal limits  BASIC METABOLIC PANEL - Abnormal; Notable for the following:    Sodium 134 (*)    Glucose, Bld 447 (*)    All other components within normal limits  URINALYSIS, ROUTINE W REFLEX MICROSCOPIC - Abnormal; Notable for the following:    Specific Gravity, Urine 1.035 (*)    Glucose, UA >1000 (*)    Ketones, ur 40 (*)    All other components within normal limits  GLUCOSE, CAPILLARY - Abnormal; Notable for the following:    Glucose-Capillary 436 (*)    All other components within normal limits  GLUCOSE, CAPILLARY - Abnormal; Notable for the following:    Glucose-Capillary 352 (*)    All other components within normal limits  GLUCOSE, CAPILLARY - Abnormal; Notable for the following:    Glucose-Capillary 312 (*)    All other components within normal limits  GLUCOSE, CAPILLARY - Abnormal; Notable for the following:    Glucose-Capillary 242 (*)    All other components within normal limits  POCT I-STAT 3, BLOOD GAS (G3P V) - Abnormal; Notable for the following:    pH, Ven 7.519 (*)    pCO2, Ven 26.7 (*)    All other components within normal limits  TROPONIN I  URINE MICROSCOPIC-ADD ON  TROPONIN I  HEMOGLOBIN A1C  POCT PREGNANCY, URINE   Ct Abdomen Pelvis Wo Contrast  11/09/2012  *RADIOLOGY REPORT*  Clinical  Data: Right flank pain  CT ABDOMEN AND PELVIS WITHOUT CONTRAST  Technique:  Multidetector CT imaging of the abdomen and pelvis was performed following the standard protocol without intravenous contrast.  Comparison:  Findings: The sagittal images of the spine shows disc space flattening with vacuum disc phenomenon at L5-S1 level.  Lung bases are unremarkable.  There are streak artifacts from the patient's body habitus.  Unenhanced liver, spleen, pancreas and adrenals are unremarkable.  No calcified gallstones are noted within gallbladder.  No nephrolithiasis.  No calcified ureteral calculi are noted.  The unenhanced kidneys shows no hydronephrosis or hydroureter.  No aortic aneurysm.  No small bowel obstruction.  No ascites or free air.  No adenopathy.  No pericecal inflammation. Normal appendix is clearly visualized in axial image 60.  Unenhanced uterus and adnexa are unremarkable.  Bilateral distal ureter is unremarkable.  No calcified calculi are noted within urinary bladder.  No pelvic ascites or adenopathy.  Pelvic phleboliths are noted.  No destructive bony lesions are noted within pelvis.  No inguinal adenopathy.  Moderate stool noted in the right colon and transverse colon.  IMPRESSION:  1.  No nephrolithiasis.  No hydronephrosis or hydroureter. 2.  No calcified ureteral calculi are noted. 3.  Normal appendix.  No pericecal inflammation. 4.  Degenerative changes lumbar spine and five-vessel level.   Original Report Authenticated By: Natasha Mead, M.D.    Dg Chest 2 View  11/09/2012  *RADIOLOGY REPORT*  Clinical Data: Hyperglycemia  CHEST - 2 VIEW  Comparison: 10/31/2012  Findings: Cardiomediastinal silhouette is stable.  No acute infiltrate or pleural effusion.  No pulmonary edema.  Bony thorax is unremarkable.  IMPRESSION: No active disease.   Original Report Authenticated By: Natasha Mead, M.D.      Date: 11/09/2012  Rate: 84  Rhythm: normal sinus rhythm  QRS Axis: left  Intervals: normal  ST/T Wave  abnormalities: nonspecific T wave changes  Conduction Disutrbances:left anterior fascicular block  Narrative Interpretation:  NSR, LAFB, NSTWA, no significant change from EKG 10/31/12  Old EKG Reviewed: unchanged    1. Chest pain   2. Hyperventilation   3. Flank pain   4. Anxiety   5. Hyperglycemia without ketosis       MDM   56 y F with PMH of IDDM complicated by bilateral diabetic foot ulcers with 4 ED visits since coming to the [place] 4/18 as a refugee from [place].  All of her presentations have been very similar and have involved moaning, poor interaction on exam and hyperventilation.  She has been hyperglycemic each time, but never in DKA.  Her workup has included multiple CBCs/chem panels, 2 UAs (hematuria), nl CT head, negative CT PE study, negative CXR x2, negative trop, neg UPTs, normal TSH and negative foot xrays.  She has been seen by social work and by the Sports coach and has had f/u arranged with a PCP.  She was there today when the pt began exhibiting these symptoms so they transferred her back to the ED.  Her sister reports that the pt has been c/o a HA since her arrival from [place].  She also reports the pt has had R flank pain for 3 months and has had nausea, but no vomiting recently.  AFVSS.  She has a blanket over her eyes, constantly moaning.  Moves all 4 extremities.  PERRL.  2 diabetic foot ulcers as above.  Will obtain basic labs, assess for DKA, initiate glucose stabilizer and plan for admission given her multiple ED visits and her failied attempt at obtaining outpt f/u today. CT abd/pelvis given hx of flank pain and hematuria on prior UAs.  There is some concern regarding her presentation for PTSD, etc.    9:40 PM Workup only remarkable for hyperglycemia.  No DKA.  CT abd pelvis unremarkable.  CXR WNLs.  Internal Medicine has evaluated the pt and feel that her symptoms may be due to angina and plan to admit her for CP r/o.  She reportedly had an MI  in [place] with no stenting at  that time.  Trop here negative and EKG unchanged from priors.  ACT team consulted.  Will administer ASA.  Disposition: Admit  Condition: Stable  Pt seen in conjunction with my attending, Dr. Radford Pax.  Oleh Genin, MD PGY-II [facility] Emergency Medicine Resident       Oleh Genin, MD 11/09/12 [phone]

## 2012-11-09 NOTE — ED Notes (Signed)
Was ked by [facility] LPN to attempt IV start; attempt x 1 successful; RIFA

## 2012-11-09 NOTE — ED Notes (Signed)
Per [facility] RN - Pt coming from urgent care, pt was seen in ED 4 days for same issues. Pt CBG 479. Family doesn't speak much english. Pt has had multiple episodes of sob, crying, tachypnea happens every time when she is going to see a doctor, progressively gets worse to the point where she becomes unable to follow commands. Pt awake and communicating with family member. Pt has hx of HTN.  Family reports LSN 4 years ago. BP 156/91 HR 91 100% room air. RR40

## 2012-11-09 NOTE — ED Notes (Signed)
Evon Slack is a local contact [phone]

## 2012-11-09 NOTE — H&P (Signed)
[facility] History and Physical  Debbie [name] ZOX:[id number] DOB: Apr 01, 1961 DOA: 11/09/2012  Referring physician: ED PCP: No PCP Per Patient  Specialists: None  Chief Complaint: Chest pain  HPI: Debbie [name] is a 52 y.o. female who presents to the ED with multiple complaints.  She has very poor interaction and history taking is difficult further complicated by the fact that she does not speak english very well, a family member is acting as Nurse, learning disability.  Among her complaints include chest pain, radiating to her neck, occuring at rest and waking her up from sleep at times.  Her chest pain is associated with shortness of breath.  Her SOB has been limiting her activity, she becomes SOB if she tries to walk across a room.  On history taking she states she had a heart attack in 2010, but no stenting nor CABG.  She states her last stress test was in 2011 (both the heart attack and stress test were done in [place], [place]).  Her other cardiac risk factors include DM2 (poorly controlled with diabetic foot ulcers), and HTN.  Hospitalist is admitting as CP rule out cardiac.  Review of Systems: Positive for headache, sleep loss,12 systems reviewed and otherwise negative.  Past Medical History  Diagnosis Date  . Type II or unspecified type diabetes mellitus with unspecified complication, uncontrolled   . Hypertension   . Thyroid disease     hypothyroid  . Heart attack 2010  . Diabetic foot ulcer    Past Surgical History  Procedure Laterality Date  . Back surgery     Social History:  reports that she has never smoked. She does not have any smokeless tobacco history on file. She reports that she does not drink alcohol or use illicit drugs.   Allergies  Allergen Reactions  . Penicillins Rash    No family history on file.  Prior to Admission medications   Medication Sig Start Date End Date Taking? Authorizing Provider  ALPRAZolam Prudy Feeler) 0.5 MG tablet Take 1 tablet (0.5 mg total) by mouth 3  (three) times daily as needed for anxiety. 10/31/12  Yes Jamesetta Orleans Lawyer, PA-C  atenolol (TENORMIN) 100 MG tablet Take 100 mg by mouth daily.   Yes Historical Provider, MD  glimepiride (AMARYL) 4 MG tablet Take 8 mg by mouth daily before breakfast.    Yes Historical Provider, MD  insulin NPH-regular (NOVOLIN 70/30) (70-30) 100 UNIT/ML injection Inject 25-30 Units into the skin 2 (two) times daily. Take 30 units every morning and 25 units at dinner. Refill x 1. 10/26/12  Yes Antony Madura, PA-C  glucose blood test strip Use as instructed 10/26/12   Antony Madura, PA-C   Physical Exam: Filed Vitals:   11/09/12 1730 11/09/12 2013 11/09/12 2100 11/09/12 2200  BP:  122/64 185/159 127/64  Pulse: 81 86 81 86  Temp:  97.7 F (36.5 C)    TempSrc:  Oral    Resp: 24 20 25 14   SpO2: 100% 98% 98% 98%    General:  NAD, resting comfortably in bed, tachypnic whenever a doctor enters the room Eyes: PEERLA EOMI ENT: mucous membranes moist Neck: supple w/o JVD Cardiovascular: RRR w/o MRG Respiratory: CTA B Abdomen: soft, nt, nd, bs+ Skin: no rash nor lesion Musculoskeletal: MAE, full ROM all 4 extremities Psychiatric: normal tone and affect Neurologic: AAOx3, grossly non-focal  Labs on Admission:  Basic Metabolic Panel:  Recent Labs Lab 11/05/12 1800 11/05/12 1804 11/09/12 1706  NA 136 137 134*  K 4.5 4.3 3.8  CL 99 105 99  CO2 23  --  22  GLUCOSE 371* 379* 447*  BUN 14 15 13   CREATININE 0.61 0.50 0.63  CALCIUM 9.6  --  9.6   Liver Function Tests:  Recent Labs Lab 11/05/12 1800  AST 21  ALT 31  ALKPHOS 97  BILITOT 0.5  PROT 6.8  ALBUMIN 3.3*   No results found for this basename: LIPASE, AMYLASE,  in the last 168 hours No results found for this basename: AMMONIA,  in the last 168 hours CBC:  Recent Labs Lab 11/05/12 1800 11/05/12 1804 11/09/12 1706  WBC 8.8  --  9.3  NEUTROABS 3.8  --  4.7  HGB 15.3* 15.6* 15.4*  HCT 43.1 46.0 43.3  MCV 76.7*  --  77.0*  PLT 222  --   240   Cardiac Enzymes:  Recent Labs Lab 11/09/12 1734  TROPONINI <0.30    BNP (last 3 results) No results found for this basename: PROBNP,  in the last 8760 hours CBG:  Recent Labs Lab 11/09/12 1548 11/09/12 1711 11/09/12 1856 11/09/12 2015 11/09/12 2150  GLUCAP 479* 436* 352* 312* 242*    Radiological Exams on Admission: Ct Abdomen Pelvis Wo Contrast  11/09/2012  *RADIOLOGY REPORT*  Clinical Data: Right flank pain  CT ABDOMEN AND PELVIS WITHOUT CONTRAST  Technique:  Multidetector CT imaging of the abdomen and pelvis was performed following the standard protocol without intravenous contrast.  Comparison:  Findings: The sagittal images of the spine shows disc space flattening with vacuum disc phenomenon at L5-S1 level.  Lung bases are unremarkable.  There are streak artifacts from the patient's body habitus.  Unenhanced liver, spleen, pancreas and adrenals are unremarkable.  No calcified gallstones are noted within gallbladder.  No nephrolithiasis.  No calcified ureteral calculi are noted.  The unenhanced kidneys shows no hydronephrosis or hydroureter.  No aortic aneurysm.  No small bowel obstruction.  No ascites or free air.  No adenopathy.  No pericecal inflammation. Normal appendix is clearly visualized in axial image 60.  Unenhanced uterus and adnexa are unremarkable.  Bilateral distal ureter is unremarkable.  No calcified calculi are noted within urinary bladder.  No pelvic ascites or adenopathy.  Pelvic phleboliths are noted.  No destructive bony lesions are noted within pelvis.  No inguinal adenopathy.  Moderate stool noted in the right colon and transverse colon.  IMPRESSION:  1.  No nephrolithiasis.  No hydronephrosis or hydroureter. 2.  No calcified ureteral calculi are noted. 3.  Normal appendix.  No pericecal inflammation. 4.  Degenerative changes lumbar spine and five-vessel level.   Original Report Authenticated By: Natasha Mead, M.D.    Dg Chest 2 View  11/09/2012  *RADIOLOGY  REPORT*  Clinical Data: Hyperglycemia  CHEST - 2 VIEW  Comparison: 10/31/2012  Findings: Cardiomediastinal silhouette is stable.  No acute infiltrate or pleural effusion.  No pulmonary edema.  Bony thorax is unremarkable.  IMPRESSION: No active disease.   Original Report Authenticated By: Natasha Mead, M.D.     EKG: Independently reviewed.  Assessment/Plan Principal Problem:   Chest pain Active Problems:   Diabetes mellitus type 2 with peripheral artery disease   Type II or unspecified type diabetes mellitus with unspecified complication, uncontrolled   HTN (hypertension)   Tachypnea   1. Chest pain - given h/o DM2 with complications, patient certianly needs to be admitted for cardiac ruleout, will do so, serial troponins, tele monitor, likely needs cards eval in AM and may end up needing cath vs  stress test.  2D echo ordered, also have ordered carotid dopplers since per her sister she is describing "arteries in neck becoming progressively closed". 2. Tachypnea - could be cardiac but there is also some concern given the fact that this seems to occur when ever a health professional enters the room that this and possibly some of the other patients symptoms may be related to PTSD or other anxiety state.  Need to rule out cardiac disease first and if negative then needs further psych evaluation later on. 3. DM2 - holding home meds and putting on SSI med dose. 4. HTN - continue home meds    Code Status: Full Code (must indicate code status--if unknown or must be presumed, indicate so) Family Communication: Spoke with sister at bedside (indicate person spoken with, if applicable, with phone number if by telephone) Disposition Plan: Admit to obs (indicate anticipated LOS)  Time spent: 70 min  GARDNER, JARED M. [facility] Pager [phone]  If 7PM-7AM, please contact night-coverage [email or web address] Password [email or web address] 11/09/2012, 11:00 PM

## 2012-11-10 DIAGNOSIS — F411 Generalized anxiety disorder: Secondary | ICD-10-CM

## 2012-11-10 DIAGNOSIS — R079 Chest pain, unspecified: Secondary | ICD-10-CM

## 2012-11-10 DIAGNOSIS — F4323 Adjustment disorder with mixed anxiety and depressed mood: Secondary | ICD-10-CM

## 2012-11-10 DIAGNOSIS — R0989 Other specified symptoms and signs involving the circulatory and respiratory systems: Secondary | ICD-10-CM

## 2012-11-10 LAB — CBC
Hemoglobin: 13 g/dL (ref 12.0–15.0)
Platelets: 223 10*3/uL (ref 150–400)
RBC: 4.88 MIL/uL (ref 3.87–5.11)
WBC: 8.3 10*3/uL (ref 4.0–10.5)

## 2012-11-10 LAB — BASIC METABOLIC PANEL
CO2: 26 mEq/L (ref 19–32)
Chloride: 107 mEq/L (ref 96–112)
Potassium: 3.4 mEq/L — ABNORMAL LOW (ref 3.5–5.1)
Sodium: 140 mEq/L (ref 135–145)

## 2012-11-10 LAB — GLUCOSE, CAPILLARY
Glucose-Capillary: 138 mg/dL — ABNORMAL HIGH (ref 70–99)
Glucose-Capillary: 147 mg/dL — ABNORMAL HIGH (ref 70–99)
Glucose-Capillary: 157 mg/dL — ABNORMAL HIGH (ref 70–99)
Glucose-Capillary: 158 mg/dL — ABNORMAL HIGH (ref 70–99)
Glucose-Capillary: 165 mg/dL — ABNORMAL HIGH (ref 70–99)

## 2012-11-10 LAB — TROPONIN I: Troponin I: <0.3 ng/mL (ref <0.30)

## 2012-11-10 MED ORDER — PNEUMOCOCCAL VAC POLYVALENT 25 MCG/0.5ML IJ INJ
0.5000 mL | INJECTION | INTRAMUSCULAR | Status: AC
  Administered 2012-11-11: 0.5 mL via INTRAMUSCULAR
  Filled 2012-11-10: qty 0.5

## 2012-11-10 NOTE — Consult Note (Signed)
Reason for Consult:"anxiety" Referring Physician: Dr. Lars Masson Debbie [name] is an 52 y.o. female.  HPI: 52 Y/O female single who came [place] from [place] in [place].  She does not speak English too well. Her sister who assisted the military in [place] brought her here. She serves as Equities trader. She has multiple physical complains. The original one was tiredness. She is diabetic and apparently did not take her medications for several day [place]. She complains of pain in her legs having difficulty walking. Her sister shares that she had been very upset due to recurrent lesions (boils) in her gluteal/perianal  area. She has had then drained several times. She tended to worry about them. There is no clear past history of depression or anxiety Past Psychiatric History: Denies previous treatment Alcohol and Drug History: Denies use of alcohol or drugs Medical Hx: as per chart Past Medical History  Diagnosis Date  . Type II or unspecified type diabetes mellitus with unspecified complication, uncontrolled   . Hypertension   . Thyroid disease     hypothyroid  . Heart attack 2010  . Diabetic foot ulcer     Past Surgical History  Procedure Laterality Date  . Back surgery      No family history on file.  Social History:  reports that she has never smoked. She does not have any smokeless tobacco history on file. She reports that she does not drink alcohol or use illicit drugs.  Allergies:  Allergies  Allergen Reactions  . Penicillins Rash    Medications: I have reviewed the patient's current medications.  Results for orders placed during the hospital encounter of 11/09/12 (from the past 48 hour(s))  CBC WITH DIFFERENTIAL     Status: Abnormal   Collection Time    11/09/12  5:06 PM      Result Value Range   WBC 9.3  4.0 - 10.5 K/uL   RBC 5.62 (*) 3.87 - 5.11 MIL/uL   Hemoglobin 15.4 (*) 12.0 - 15.0 g/dL   HCT 40.9  81.1 - 91.4 %   MCV 77.0 (*) 78.0 - 100.0 fL   MCH  27.4  26.0 - 34.0 pg   MCHC 35.6  30.0 - 36.0 g/dL   RDW 78.2  95.6 - 21.3 %   Platelets 240  150 - 400 K/uL   Neutrophils Relative 50  43 - 77 %   Neutro Abs 4.7  1.7 - 7.7 K/uL   Lymphocytes Relative 44  12 - 46 %   Lymphs Abs 4.1 (*) 0.7 - 4.0 K/uL   Monocytes Relative 3  3 - 12 %   Monocytes Absolute 0.3  0.1 - 1.0 K/uL   Eosinophils Relative 2  0 - 5 %   Eosinophils Absolute 0.2  0.0 - 0.7 K/uL   Basophils Relative 1  0 - 1 %   Basophils Absolute 0.1  0.0 - 0.1 K/uL  BASIC METABOLIC PANEL     Status: Abnormal   Collection Time    11/09/12  5:06 PM      Result Value Range   Sodium 134 (*) 135 - 145 mEq/L   Potassium 3.8  3.5 - 5.1 mEq/L   Chloride 99  96 - 112 mEq/L   CO2 22  19 - 32 mEq/L   Glucose, Bld 447 (*) 70 - 99 mg/dL   BUN 13  6 - 23 mg/dL   Creatinine, Ser 0.86  0.50 - 1.10 mg/dL   Calcium  9.6  8.4 - 10.5 mg/dL   GFR calc non Af Amer >90  >90 mL/min   GFR calc Af Amer >90  >90 mL/min   Comment:            The eGFR has been calculated     using the CKD EPI equation.     This calculation has not been     validated in all clinical     situations.     eGFR's persistently     <90 mL/min signify     possible Chronic Kidney Disease.  GLUCOSE, CAPILLARY     Status: Abnormal   Collection Time    11/09/12  5:11 PM      Result Value Range   Glucose-Capillary 436 (*) 70 - 99 mg/dL  POCT I-STAT 3, BLOOD GAS (G3P V)     Status: Abnormal   Collection Time    11/09/12  5:27 PM      Result Value Range   pH, Ven 7.519 (*) 7.250 - 7.300   pCO2, Ven 26.7 (*) 45.0 - 50.0 mmHg   pO2, Ven 32.0  30.0 - 45.0 mmHg   Bicarbonate 21.8  20.0 - 24.0 mEq/L   TCO2 23  0 - 100 mmol/L   O2 Saturation 70.0     Acid-Base Excess 1.0  0.0 - 2.0 mmol/L   Sample type VENOUS     Comment NOTIFIED PHYSICIAN    TROPONIN I     Status: None   Collection Time    11/09/12  5:34 PM      Result Value Range   Troponin I <0.30  <0.30 ng/mL   Comment:            Due to the release kinetics of  cTnI,     a negative result within the first hours     of the onset of symptoms does not rule out     myocardial infarction with certainty.     If myocardial infarction is still suspected,     repeat the test at appropriate intervals.  URINALYSIS, ROUTINE W REFLEX MICROSCOPIC     Status: Abnormal   Collection Time    11/09/12  6:55 PM      Result Value Range   Color, Urine YELLOW  YELLOW   APPearance CLEAR  CLEAR   Specific Gravity, Urine 1.035 (*) 1.005 - 1.030   pH 7.0  5.0 - 8.0   Glucose, UA >1000 (*) NEGATIVE mg/dL   Hgb urine dipstick NEGATIVE  NEGATIVE   Bilirubin Urine NEGATIVE  NEGATIVE   Ketones, ur 40 (*) NEGATIVE mg/dL   Protein, ur NEGATIVE  NEGATIVE mg/dL   Urobilinogen, UA 0.2  0.0 - 1.0 mg/dL   Nitrite NEGATIVE  NEGATIVE   Leukocytes, UA NEGATIVE  NEGATIVE  URINE MICROSCOPIC-ADD ON     Status: None   Collection Time    11/09/12  6:55 PM      Result Value Range   Squamous Epithelial / LPF RARE  RARE   WBC, UA 0-2  <3 WBC/hpf   Urine-Other FEW YEAST    GLUCOSE, CAPILLARY     Status: Abnormal   Collection Time    11/09/12  6:56 PM      Result Value Range   Glucose-Capillary 352 (*) 70 - 99 mg/dL   Comment 1 Documented in Chart    POCT PREGNANCY, URINE     Status: None   Collection Time    11/09/12  7:59 PM  Result Value Range   Preg Test, Ur NEGATIVE  NEGATIVE   Comment:            THE SENSITIVITY OF THIS     METHODOLOGY IS >24 mIU/mL  GLUCOSE, CAPILLARY     Status: Abnormal   Collection Time    11/09/12  8:15 PM      Result Value Range   Glucose-Capillary 312 (*) 70 - 99 mg/dL  GLUCOSE, CAPILLARY     Status: Abnormal   Collection Time    11/09/12  9:50 PM      Result Value Range   Glucose-Capillary 242 (*) 70 - 99 mg/dL  TROPONIN I     Status: None   Collection Time    11/09/12 11:13 PM      Result Value Range   Troponin I <0.30  <0.30 ng/mL   Comment:            Due to the release kinetics of cTnI,     a negative result within the first  hours     of the onset of symptoms does not rule out     myocardial infarction with certainty.     If myocardial infarction is still suspected,     repeat the test at appropriate intervals.  HEMOGLOBIN A1C     Status: Abnormal   Collection Time    11/09/12 11:13 PM      Result Value Range   Hemoglobin A1C 11.9 (*) <5.7 %   Comment: (NOTE)                                                                               According to the ADA Clinical Practice Recommendations for 2011, when     HbA1c is used as a screening test:      >=6.5%   Diagnostic of Diabetes Mellitus               (if abnormal result is confirmed)     5.7-6.4%   Increased risk of developing Diabetes Mellitus     References:Diagnosis and Classification of Diabetes Mellitus,Diabetes     Care,2011,34(Suppl 1):S62-S69 and Standards of Medical Care in             Diabetes - 2011,Diabetes Care,2011,34 (Suppl 1):S11-S61.   Mean Plasma Glucose 295 (*) <117 mg/dL  GLUCOSE, CAPILLARY     Status: Abnormal   Collection Time    11/09/12 11:23 PM      Result Value Range   Glucose-Capillary 163 (*) 70 - 99 mg/dL  GLUCOSE, CAPILLARY     Status: Abnormal   Collection Time    11/10/12 12:35 AM      Result Value Range   Glucose-Capillary 157 (*) 70 - 99 mg/dL  GLUCOSE, CAPILLARY     Status: Abnormal   Collection Time    11/10/12  4:11 AM      Result Value Range   Glucose-Capillary 158 (*) 70 - 99 mg/dL  GLUCOSE, CAPILLARY     Status: Abnormal   Collection Time    11/10/12  4:54 AM      Result Value Range   Glucose-Capillary 165 (*) 70 - 99 mg/dL  TROPONIN I  Status: None   Collection Time    11/10/12  6:20 AM      Result Value Range   Troponin I <0.30  <0.30 ng/mL   Comment:            Due to the release kinetics of cTnI,     a negative result within the first hours     of the onset of symptoms does not rule out     myocardial infarction with certainty.     If myocardial infarction is still suspected,     repeat  the test at appropriate intervals.  CBC     Status: None   Collection Time    11/10/12  6:20 AM      Result Value Range   WBC 8.3  4.0 - 10.5 K/uL   RBC 4.88  3.87 - 5.11 MIL/uL   Hemoglobin 13.0  12.0 - 15.0 g/dL   HCT 82.9  56.2 - 13.0 %   MCV 78.3  78.0 - 100.0 fL   MCH 26.6  26.0 - 34.0 pg   MCHC 34.0  30.0 - 36.0 g/dL   RDW 86.5  78.4 - 69.6 %   Platelets 223  150 - 400 K/uL  BASIC METABOLIC PANEL     Status: Abnormal   Collection Time    11/10/12  6:20 AM      Result Value Range   Sodium 140  135 - 145 mEq/L   Potassium 3.4 (*) 3.5 - 5.1 mEq/L   Chloride 107  96 - 112 mEq/L   CO2 26  19 - 32 mEq/L   Glucose, Bld 163 (*) 70 - 99 mg/dL   BUN 12  6 - 23 mg/dL   Creatinine, Ser 2.95  0.50 - 1.10 mg/dL   Calcium 8.4  8.4 - 28.4 mg/dL   GFR calc non Af Amer >90  >90 mL/min   GFR calc Af Amer >90  >90 mL/min   Comment:            The eGFR has been calculated     using the CKD EPI equation.     This calculation has not been     validated in all clinical     situations.     eGFR's persistently     <90 mL/min signify     possible Chronic Kidney Disease.  GLUCOSE, CAPILLARY     Status: Abnormal   Collection Time    11/10/12  7:47 AM      Result Value Range   Glucose-Capillary 138 (*) 70 - 99 mg/dL   Comment 1 Notify RN    AMMONIA     Status: Abnormal   Collection Time    11/10/12 10:18 AM      Result Value Range   Ammonia <10 (*) 11 - 60 umol/L  TROPONIN I     Status: None   Collection Time    11/10/12 10:20 AM      Result Value Range   Troponin I <0.30  <0.30 ng/mL   Comment:            Due to the release kinetics of cTnI,     a negative result within the first hours     of the onset of symptoms does not rule out     myocardial infarction with certainty.     If myocardial infarction is still suspected,     repeat the test at appropriate intervals.  GLUCOSE, CAPILLARY  Status: Abnormal   Collection Time    11/10/12 11:56 AM      Result Value Range    Glucose-Capillary 188 (*) 70 - 99 mg/dL   Comment 1 Notify RN      Ct Abdomen Pelvis Wo Contrast  11/09/2012  *RADIOLOGY REPORT*  Clinical Data: Right flank pain  CT ABDOMEN AND PELVIS WITHOUT CONTRAST  Technique:  Multidetector CT imaging of the abdomen and pelvis was performed following the standard protocol without intravenous contrast.  Comparison:  Findings: The sagittal images of the spine shows disc space flattening with vacuum disc phenomenon at L5-S1 level.  Lung bases are unremarkable.  There are streak artifacts from the patient's body habitus.  Unenhanced liver, spleen, pancreas and adrenals are unremarkable.  No calcified gallstones are noted within gallbladder.  No nephrolithiasis.  No calcified ureteral calculi are noted.  The unenhanced kidneys shows no hydronephrosis or hydroureter.  No aortic aneurysm.  No small bowel obstruction.  No ascites or free air.  No adenopathy.  No pericecal inflammation. Normal appendix is clearly visualized in axial image 60.  Unenhanced uterus and adnexa are unremarkable.  Bilateral distal ureter is unremarkable.  No calcified calculi are noted within urinary bladder.  No pelvic ascites or adenopathy.  Pelvic phleboliths are noted.  No destructive bony lesions are noted within pelvis.  No inguinal adenopathy.  Moderate stool noted in the right colon and transverse colon.  IMPRESSION:  1.  No nephrolithiasis.  No hydronephrosis or hydroureter. 2.  No calcified ureteral calculi are noted. 3.  Normal appendix.  No pericecal inflammation. 4.  Degenerative changes lumbar spine and five-vessel level.   Original Report Authenticated By: Natasha Mead, M.D.    Dg Chest 2 View  11/09/2012  *RADIOLOGY REPORT*  Clinical Data: Hyperglycemia  CHEST - 2 VIEW  Comparison: 10/31/2012  Findings: Cardiomediastinal silhouette is stable.  No acute infiltrate or pleural effusion.  No pulmonary edema.  Bony thorax is unremarkable.  IMPRESSION: No active disease.   Original Report  Authenticated By: Natasha Mead, M.D.     Review of Systems  HENT: Negative.   Eyes: Negative.   Respiratory: Negative.   Cardiovascular: Negative.   Gastrointestinal: Positive for nausea and abdominal pain.  Musculoskeletal: Positive for myalgias.  Skin: Negative.   Neurological: Positive for dizziness and weakness.  Endo/Heme/Allergies: Negative.   Psychiatric/Behavioral: Positive for depression. The patient is nervous/anxious.    Blood pressure 111/73, pulse 68, temperature 98.7 F (37.1 C), temperature source Oral, resp. rate 18, weight 101.56 kg (223 lb 14.4 oz), SpO2 98.00%. Physical Exam Her sister serves as interpreter. She is alert, cooperative. She is not spontaneous and exhibits some psychomotor retardation. Her mood seems to be worried, depressed, in pain. Her affect is sad, uncomfortable. There is no spontaneous content. She is somatically focused, complaining of pain and her inability to ambulate without pain. Her sister touches her legs and she complains that it it hurting.   Assessment/Plan:  R/O Adjustment Disorder with mixed depression and anxiety, R/O Depressive Disorder NOS She would probably benefit from using an SSRI:  Zoloft 25 mg that could be increased to 50 as tolerated  Errika Narvaiz A 11/10/2012, 3:06 PM

## 2012-11-10 NOTE — Progress Notes (Signed)
UR Completed Jalila Goodnough Graves-Bigelow, RN,BSN [phone]  

## 2012-11-10 NOTE — Progress Notes (Signed)
Bilateral carotid artery duplex:  No evidence of hemodynamically significant internal carotid artery stenosis.   Vertebral artery flow is antegrade.     

## 2012-11-10 NOTE — Progress Notes (Signed)
PROGRESS NOTE  Debbie [name] WGN:[id number] DOB: Oct 21, 1960 DOA: 11/09/2012 PCP: No PCP Per Patient  Brief narrative: 52 yr old arabic female admitted 11/09/12 with uncontrolled DM,   Past medical history-As per Problem list Chart reviewed as below- Seen in ED multiple times, 4/18, 4/  Consultants:  Psychiatry  Procedures:  none  Antibiotics:  none   Subjective  Somnolent.  laying in bed.  Poor eye contact.  Answers questions but doesn't really awaken Sister at bedside interpreting, states she has eye pain, chest heaviness, back pain, leg pain and has been generally lethargic and somnolent like this for the past 3 years with frequent weekly visits to [facility] in [place] She has a multitude of somatic complaints, none fitting any specific organ system   Objective    Interim History: none  Telemetry: NSR  Objective: Filed Vitals:   11/09/12 2200 11/09/12 2300 11/10/12 0038 11/10/12 0400  BP: 127/64 82/69 135/62 124/65  Pulse: 86 88 83 87  Temp:   98.6 F (37 C) 97.7 F (36.5 C)  TempSrc:      Resp: 14 21 24 20   Weight:   101.56 kg (223 lb 14.4 oz)   SpO2: 98% 96% 100% 97%   No intake or output data in the 24 hours ending 11/10/12 7846  Exam:  General: alert  somnolent Cardiovascular: s1 s2 no m/r/g Respiratory: clear, no added sound Abdomen: soft, NT/ND Skinno rash-has some small areas of breakdown over LE's on toes, but not infected or  Neurosleepy moving all 4 limbs  Data Reviewed: Basic Metabolic Panel:  Recent Labs Lab 11/05/12 1800 11/05/12 1804 11/09/12 1706 11/10/12 0620  NA 136 137 134* 140  K 4.5 4.3 3.8 3.4*  CL 99 105 99 107  CO2 23  --  22 26  GLUCOSE 371* 379* 447* 163*  BUN 14 15 13 12   CREATININE 0.61 0.50 0.63 0.67  CALCIUM 9.6  --  9.6 8.4   Liver Function Tests:  Recent Labs Lab 11/05/12 1800  AST 21  ALT 31  ALKPHOS 97  BILITOT 0.5  PROT 6.8  ALBUMIN 3.3*   No results found for this basename: LIPASE, AMYLASE,  in  the last 168 hours No results found for this basename: AMMONIA,  in the last 168 hours CBC:  Recent Labs Lab 11/05/12 1800 11/05/12 1804 11/09/12 1706 11/10/12 0620  WBC 8.8  --  9.3 8.3  NEUTROABS 3.8  --  4.7  --   HGB 15.3* 15.6* 15.4* 13.0  HCT 43.1 46.0 43.3 38.2  MCV 76.7*  --  77.0* 78.3  PLT 222  --  240 223   Cardiac Enzymes:  Recent Labs Lab 11/09/12 1734 11/09/12 2313 11/10/12 0620  TROPONINI <0.30 <0.30 <0.30   BNP: No components found with this basename: POCBNP,  CBG:  Recent Labs Lab 11/09/12 2323 11/10/12 0035 11/10/12 0411 11/10/12 0454 11/10/12 0747  GLUCAP 163* 157* 158* 165* 138*    No results found for this or any previous visit (from the past 240 hour(s)).   Studies:              All Imaging reviewed and is as per above notation   Scheduled Meds: . atenolol  100 mg Oral Daily  . heparin  5,000 Units Subcutaneous Q8H  . insulin aspart  0-15 Units Subcutaneous Q4H  . sodium chloride  3 mL Intravenous Q12H   Continuous Infusions:    Assessment/Plan: 1. Uncontrolled DM-Was transiently on Insulin Gtt.  Will  transition to TID qACs insulin and determine her daily required dose 2. Lethargy-Unclear etiology-she might have PTSD 2/2 to experiences ion [place].  Her presentation is chronic and interspersed by multiple ED visits both here and in [place]-I suspect she will need psychotherapy-continue Xanax 0.5 mg tid prn.  Would hold for sedation.  Get TSH and Ammonia level 3. Chest pain-reported as being acute, however with other multiple somatic complaints as well as negative troponin's and no significant changes from 11/05/12 ekg, would not work up further other than echo- If needed will consult cards, but lower pre-test probability 4. Diabetic foot ulcer.  apparently will be seeing podiatrist Dr. Raynald Kemp, on 11/14/12-currently seems uninfected. 5. Multiple Back surgeries- currently stable  Code Status: Full Family Communication: Updated sister and  friend at bedsdie Disposition Plan:  inpatient   Pleas Koch, MD  [facility] Pager [phone] 11/10/2012, 8:32 AM    LOS: 1 day

## 2012-11-11 DIAGNOSIS — I519 Heart disease, unspecified: Secondary | ICD-10-CM

## 2012-11-11 LAB — COMPREHENSIVE METABOLIC PANEL
ALT: 30 U/L (ref 0–35)
AST: 25 U/L (ref 0–37)
Alkaline Phosphatase: 76 U/L (ref 39–117)
CO2: 23 mEq/L (ref 19–32)
Chloride: 107 mEq/L (ref 96–112)
Creatinine, Ser: 0.61 mg/dL (ref 0.50–1.10)
GFR calc non Af Amer: >90 mL/min (ref >90)
Potassium: 3.7 mEq/L (ref 3.5–5.1)
Total Bilirubin: 0.4 mg/dL (ref 0.3–1.2)

## 2012-11-11 LAB — GLUCOSE, CAPILLARY
Glucose-Capillary: 206 mg/dL — ABNORMAL HIGH (ref 70–99)
Glucose-Capillary: 301 mg/dL — ABNORMAL HIGH (ref 70–99)
Glucose-Capillary: 141 mg/dL — ABNORMAL HIGH (ref 70–99)

## 2012-11-11 LAB — URINALYSIS, ROUTINE W REFLEX MICROSCOPIC
Protein, ur: NEGATIVE mg/dL
Urobilinogen, UA: 0.2 mg/dL (ref 0.0–1.0)

## 2012-11-11 LAB — CBC
MCV: 79.5 fL (ref 78.0–100.0)
Platelets: 218 10*3/uL (ref 150–400)
RBC: 4.79 MIL/uL (ref 3.87–5.11)
WBC: 7 10*3/uL (ref 4.0–10.5)

## 2012-11-11 LAB — URINE MICROSCOPIC-ADD ON

## 2012-11-11 MED ORDER — INSULIN ASPART PROT & ASPART (70-30 MIX) 100 UNIT/ML ~~LOC~~ SUSP
10.0000 [IU] | Freq: Two times a day (BID) | SUBCUTANEOUS | Status: DC
  Administered 2012-11-11 – 2012-11-12 (×3): 10 [IU] via SUBCUTANEOUS
  Filled 2012-11-11: qty 10

## 2012-11-11 MED ORDER — LIVING WELL WITH DIABETES BOOK
Freq: Once | Status: AC
  Administered 2012-11-11: 12:00:00
  Filled 2012-11-11: qty 1

## 2012-11-11 MED ORDER — POTASSIUM CHLORIDE CRYS ER 20 MEQ PO TBCR
40.0000 meq | EXTENDED_RELEASE_TABLET | Freq: Once | ORAL | Status: AC
  Administered 2012-11-11: 40 meq via ORAL
  Filled 2012-11-11: qty 2

## 2012-11-11 MED ORDER — ACETAMINOPHEN 325 MG PO TABS
650.0000 mg | ORAL_TABLET | Freq: Four times a day (QID) | ORAL | Status: DC | PRN
  Administered 2012-11-11 – 2012-11-12 (×2): 650 mg via ORAL
  Filled 2012-11-11 (×2): qty 2

## 2012-11-11 MED ORDER — SERTRALINE HCL 25 MG PO TABS
25.0000 mg | ORAL_TABLET | Freq: Every day | ORAL | Status: DC
  Administered 2012-11-11 – 2012-11-13 (×3): 25 mg via ORAL
  Filled 2012-11-11 (×3): qty 1

## 2012-11-11 MED ORDER — LISINOPRIL 5 MG PO TABS
5.0000 mg | ORAL_TABLET | Freq: Every day | ORAL | Status: DC
  Administered 2012-11-12 – 2012-11-13 (×2): 5 mg via ORAL
  Filled 2012-11-11 (×2): qty 1

## 2012-11-11 MED ORDER — ATENOLOL 50 MG PO TABS
50.0000 mg | ORAL_TABLET | Freq: Every day | ORAL | Status: DC
  Administered 2012-11-12 – 2012-11-13 (×2): 50 mg via ORAL
  Filled 2012-11-11 (×2): qty 1

## 2012-11-11 MED ORDER — BLOOD PRESSURE CONTROL BOOK
Freq: Once | Status: AC
  Administered 2012-11-11: 12:00:00
  Filled 2012-11-11: qty 1

## 2012-11-11 MED ORDER — INSULIN ASPART 100 UNIT/ML ~~LOC~~ SOLN
0.0000 [IU] | Freq: Three times a day (TID) | SUBCUTANEOUS | Status: DC
  Administered 2012-11-11: 11 [IU] via SUBCUTANEOUS
  Administered 2012-11-12: 5 [IU] via SUBCUTANEOUS
  Administered 2012-11-12: 8 [IU] via SUBCUTANEOUS
  Administered 2012-11-12 – 2012-11-13 (×2): 11 [IU] via SUBCUTANEOUS

## 2012-11-11 MED ORDER — FUROSEMIDE 10 MG/ML IJ SOLN
40.0000 mg | Freq: Once | INTRAMUSCULAR | Status: AC
  Administered 2012-11-11: 40 mg via INTRAVENOUS
  Filled 2012-11-11: qty 4

## 2012-11-11 MED ORDER — CLONAZEPAM 0.5 MG PO TABS
0.2500 mg | ORAL_TABLET | Freq: Two times a day (BID) | ORAL | Status: DC
  Administered 2012-11-11 – 2012-11-12 (×4): 0.25 mg via ORAL
  Filled 2012-11-11 (×5): qty 1

## 2012-11-11 NOTE — Care Management Note (Unsigned)
    Page 1 of 2   11/13/2012     11:52:22 AM   CARE MANAGEMENT NOTE 11/13/2012  Patient:  Debbie, [name]   Account Number:  [id number]  Date Initiated:  11/11/2012  Documentation initiated by:  GRAVES-BIGELOW,Takima Encina  Subjective/Objective Assessment:   Pt admitted with cp and uncontrolled diabetes.     Action/Plan:   CM will speak to pt in reference to PCP during the week. CM did call RN [facility] and pt is receiving IV lasix for diuresis. CM will f/u for additional needs such as medication assistance.   Anticipated DC Date:  11/13/2012   Anticipated DC Plan:  HOME W HOME HEALTH SERVICES      DC Planning Services  CM consult  [facility]  [facility]  Medication Assistance      Choice offered to / List presented to:             Status of service:  In process, will continue to follow Medicare Important Message given?   (If response is "NO", the following Medicare IM given date fields will be blank) Date Medicare IM given:   Date Additional Medicare IM given:    Discharge Disposition:    Per UR Regulation:  Reviewed for med. necessity/level of care/duration of stay  If discussed at Long Length of Stay Meetings, dates discussed:    Comments:   11/13/12 1140 Elmer Bales RN,MSN [phone] [facility]. Pt is scheduled 11/16/12 at 1630. Information noted in AVS in EPIC. CM attempted to contact patient's sister to see if she feels that a home health RN would be beneficial for medication management. Unable to reach, will try back later today.  11-13-12 [address], [place] [phone] CM will make pt a f/u appointment at the [facility] ([facility]).  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 Will [facility] with [facility]- several calls have been attempted to make f/u appointment will continue to call until clinic reached. Claris Che would like to be contacted regarding when pt might be discharged in order to arrange transportation- when information known regarding f/u appointment and d/c plans will contact Claris Che with information. NCM to continue to follow for d/c needs.

## 2012-11-11 NOTE — Progress Notes (Signed)
PROGRESS NOTE  Debbie [name] WUJ:[id number] DOB: Sep 07, 1960 DOA: 11/09/2012 PCP: No PCP Per Patient  Brief narrative: 52 yr old [place] female admitted 11/09/12 with uncontrolled DM,   Past medical history-As per Problem list Chart reviewed as below- Seen in ED multiple times, 4/18, 4/23, 4/28 and 5/2  Consultants:  Psychiatry  Procedures:  none  Antibiotics:  none   Subjective  Feels much better, shortness of breath is improving.  Seen with her sister at bedside.   Objective    Interim History: none  Telemetry: NSR  Objective: Filed Vitals:   11/10/12 1343 11/10/12 2100 11/11/12 0407 11/11/12 0900  BP: 111/73 131/75 104/65 126/80  Pulse: 68 65 55 64  Temp: 98.7 F (37.1 C) 98.2 F (36.8 C) 98.2 F (36.8 C)   TempSrc: Oral     Resp: 18 20 20    Weight:      SpO2: 98% 98% 96%     Intake/Output Summary (Last 24 hours) at 11/11/12 1144 Last data filed at 11/11/12 0823  Gross per 24 hour  Intake    360 ml  Output      0 ml  Net    360 ml    Exam:  General: alert  somnolent Cardiovascular: s1 s2 no m/r/g Respiratory: clear, no added sound Abdomen: soft, NT/ND Skinno rash-has some small areas of breakdown over LE's on toes, but not infected or  Neurosleepy moving all 4 limbs  Data Reviewed: Basic Metabolic Panel:  Recent Labs Lab 11/05/12 1800 11/05/12 1804 11/09/12 1706 11/10/12 0620 11/11/12 0438  NA 136 137 134* 140 140  K 4.5 4.3 3.8 3.4* 3.7  CL 99 105 99 107 107  CO2 23  --  22 26 23   GLUCOSE 371* 379* 447* 163* 167*  BUN 14 15 13 12 12   CREATININE 0.61 0.50 0.63 0.67 0.61  CALCIUM 9.6  --  9.6 8.4 8.9   Liver Function Tests:  Recent Labs Lab 11/05/12 1800 11/11/12 0438  AST 21 25  ALT 31 30  ALKPHOS 97 76  BILITOT 0.5 0.4  PROT 6.8 5.7*  ALBUMIN 3.3* 2.8*   No results found for this basename: LIPASE, AMYLASE,  in the last 168 hours  Recent Labs Lab 11/10/12 1018  AMMONIA <10*   CBC:  Recent Labs Lab 11/05/12 1800  11/05/12 1804 11/09/12 1706 11/10/12 0620 11/11/12 0438  WBC 8.8  --  9.3 8.3 7.0  NEUTROABS 3.8  --  4.7  --   --   HGB 15.3* 15.6* 15.4* 13.0 12.9  HCT 43.1 46.0 43.3 38.2 38.1  MCV 76.7*  --  77.0* 78.3 79.5  PLT 222  --  240 223 218   Cardiac Enzymes:  Recent Labs Lab 11/09/12 1734 11/09/12 2313 11/10/12 0620 11/10/12 1020  TROPONINI <0.30 <0.30 <0.30 <0.30   BNP: No components found with this basename: POCBNP,  CBG:  Recent Labs Lab 11/10/12 1625 11/10/12 2018 11/10/12 2343 11/11/12 0405 11/11/12 0752  GLUCAP 224* 184* 147* 158* 156*    No results found for this or any previous visit (from the past 240 hour(s)).   Studies:              All Imaging reviewed and is as per above notation   Scheduled Meds: . atenolol  100 mg Oral Daily  . blood pressure control book   Does not apply Once  . furosemide  40 mg Intravenous Once  . heparin  5,000 Units Subcutaneous Q8H  . insulin aspart  0-15 Units Subcutaneous Q4H  . insulin aspart protamine- aspart  10 Units Subcutaneous BID WC  . living well with diabetes book   Does not apply Once  . sodium chloride  3 mL Intravenous Q12H   Continuous Infusions:    Assessment/Plan:  Uncontrolled DM Was transiently on Insulin Gtt.  Will transition to TID qACs insulin and determine her daily required dose. Restarted 70/30 insulin mix at 10 units twice a day. Patient reported that she's taken 30 and 25 units at home Needs close followup as outpatient, patient seems to have peripheral neuropathy and bilateral foot ulcer.  Lethargy Clearly better, she'll wake, alert oriented x3, might be secondary to the benzodiazepines. No eye contact, but she engaged in a meaningful conversation. She probably has anxiety because of her multiple medical conditions, she was asked to scheduled Klonopin.  Chest pain 12-lead EKG and 3 sets of cardiac enzymes were negative for evidence of ischemia Obtain 2-D echocardiogram and BNP as  patient said she has some mild orthopnea.  Diabetic foot ulcer Apparently will be seeing podiatrist Dr. Raynald Kemp, on 11/14/12-currently seems uninfected.  Multiple Back surgeries- currently stable  Code Status: Full Family Communication: Updated sister and friend at bedsdie Disposition Plan:  inpatient   [facility] Pager [phone]  11/11/2012, 11:44 AM    LOS: 2 days

## 2012-11-11 NOTE — Plan of Care (Signed)
Problem: Consults Goal: Diabetes Mellitus Patient Education See Patient Education Module for education specifics.  Outcome: Progressing Family watching diabetic videos to be able to instruct pt how to manage  diabetes Goal: Diagnosis-Diabetes Mellitus Outcome: Progressing Hyperglycemia

## 2012-11-11 NOTE — Progress Notes (Signed)
  Echocardiogram 2D Echocardiogram has been performed.  Elson Ulbrich FRANCES 11/11/2012, 4:36 PM

## 2012-11-11 NOTE — Progress Notes (Signed)
TC from Evon Slack who is a special cases coordinator with the [employer]. Her # is [phone], she is working with the pt to help her get established in the healthcare arena. She is open to staff calling her and working together to accomplish this. She requested the fax number to send the pre-departure assessment from [place]. Which was received and MD made aware.

## 2012-11-12 ENCOUNTER — Encounter (HOSPITAL_COMMUNITY): Payer: Self-pay | Admitting: Nurse Practitioner

## 2012-11-12 LAB — URINE CULTURE: Colony Count: >=100000

## 2012-11-12 LAB — GLUCOSE, CAPILLARY: Glucose-Capillary: 176 mg/dL — ABNORMAL HIGH (ref 70–99)

## 2012-11-12 LAB — PRO B NATRIURETIC PEPTIDE: Pro B Natriuretic peptide (BNP): 113.9 pg/mL (ref 0–125)

## 2012-11-12 MED ORDER — DEXTROSE 5 % IV SOLN
1.0000 g | INTRAVENOUS | Status: DC
  Filled 2012-11-12: qty 10

## 2012-11-12 MED ORDER — DEXTROSE 5 % IV SOLN
1.0000 g | INTRAVENOUS | Status: DC
  Administered 2012-11-12: 1 g via INTRAVENOUS
  Filled 2012-11-12 (×2): qty 10

## 2012-11-12 MED ORDER — ASPIRIN 81 MG PO CHEW
81.0000 mg | CHEWABLE_TABLET | Freq: Every day | ORAL | Status: DC
  Administered 2012-11-12 – 2012-11-13 (×2): 81 mg via ORAL
  Filled 2012-11-12 (×2): qty 1

## 2012-11-12 MED ORDER — FLUCONAZOLE 200 MG PO TABS
200.0000 mg | ORAL_TABLET | Freq: Once | ORAL | Status: AC
  Administered 2012-11-12: 200 mg via ORAL
  Filled 2012-11-12: qty 1

## 2012-11-12 MED ORDER — INSULIN ASPART PROT & ASPART (70-30 MIX) 100 UNIT/ML ~~LOC~~ SUSP
30.0000 [IU] | Freq: Every day | SUBCUTANEOUS | Status: DC
  Filled 2012-11-12: qty 10

## 2012-11-12 MED ORDER — INSULIN ASPART PROT & ASPART (70-30 MIX) 100 UNIT/ML ~~LOC~~ SUSP
25.0000 [IU] | Freq: Every day | SUBCUTANEOUS | Status: DC
  Filled 2012-11-12: qty 10

## 2012-11-12 MED ORDER — REGADENOSON 0.4 MG/5ML IV SOLN
0.4000 mg | Freq: Once | INTRAVENOUS | Status: AC
  Administered 2012-11-13: 0.4 mg via INTRAVENOUS
  Filled 2012-11-12: qty 5

## 2012-11-12 NOTE — Progress Notes (Signed)
PROGRESS NOTE  Debbie [name] WUJ:[id number] DOB: [birth year] DOA: 11/09/2012 PCP: No PCP Per Patient  Brief narrative: 52 yr old [place] female admitted 11/09/12 with uncontrolled DM,   Past medical history-As per Problem list Chart reviewed as below- Seen in ED multiple times, 4/18, 4/23, 4/28 and 5/2  Consultants:  Psychiatry.  Cardiology  Procedures:  none  Antibiotics:  none   Subjective  Feels much better, shortness of breath is improving.  2-D echocardiogram showed global hypokinesis with LVEF of 45-50% with grade 1 diastolic dysfunction. Seen with her sister at bedside.   Objective    Interim History: none  Telemetry: NSR  Objective: Filed Vitals:   11/11/12 2100 11/12/12 0500 11/12/12 0900 11/12/12 1400  BP: 122/58 134/65  127/81  Pulse: 67 69  62  Temp: 97.4 F (36.3 C) 98 F (36.7 C)  98.2 F (36.8 C)  TempSrc:    Oral  Resp: 18 18  18   Height:   5\' 5"  (1.651 m)   Weight:   99.791 kg (220 lb)   SpO2: 98% 95%  98%    Intake/Output Summary (Last 24 hours) at 11/12/12 1644 Last data filed at 11/12/12 1200  Gross per 24 hour  Intake    960 ml  Output    400 ml  Net    560 ml    Exam:  General: alert  somnolent Cardiovascular: s1 s2 no m/r/g Respiratory: clear, no added sound Abdomen: soft, NT/ND Skinno rash-has some small areas of breakdown over LE's on toes, but not infected or  Neurosleepy moving all 4 limbs  Data Reviewed: Basic Metabolic Panel:  Recent Labs Lab 11/05/12 1800 11/05/12 1804 11/09/12 1706 11/10/12 0620 11/11/12 0438  NA 136 137 134* 140 140  K 4.5 4.3 3.8 3.4* 3.7  CL 99 105 99 107 107  CO2 23  --  22 26 23   GLUCOSE 371* 379* 447* 163* 167*  BUN 14 15 13 12 12   CREATININE 0.61 0.50 0.63 0.67 0.61  CALCIUM 9.6  --  9.6 8.4 8.9   Liver Function Tests:  Recent Labs Lab 11/05/12 1800 11/11/12 0438  AST 21 25  ALT 31 30  ALKPHOS 97 76  BILITOT 0.5 0.4  PROT 6.8 5.7*  ALBUMIN 3.3* 2.8*   No results found  for this basename: LIPASE, AMYLASE,  in the last 168 hours  Recent Labs Lab 11/10/12 1018  AMMONIA <10*   CBC:  Recent Labs Lab 11/05/12 1800 11/05/12 1804 11/09/12 1706 11/10/12 0620 11/11/12 0438  WBC 8.8  --  9.3 8.3 7.0  NEUTROABS 3.8  --  4.7  --   --   HGB 15.3* 15.6* 15.4* 13.0 12.9  HCT 43.1 46.0 43.3 38.2 38.1  MCV 76.7*  --  77.0* 78.3 79.5  PLT 222  --  240 223 218   Cardiac Enzymes:  Recent Labs Lab 11/09/12 1734 11/09/12 2313 11/10/12 0620 11/10/12 1020  TROPONINI <0.30 <0.30 <0.30 <0.30   BNP: No components found with this basename: POCBNP,  CBG:  Recent Labs Lab 11/11/12 2020 11/11/12 2144 11/12/12 0747 11/12/12 0958 11/12/12 1135  GLUCAP 141* 141* 259* 267* 303*    No results found for this or any previous visit (from the past 240 hour(s)).   Studies:              All Imaging reviewed and is as per above notation   Scheduled Meds: . aspirin  81 mg Oral Daily  . atenolol  50 mg  Oral Daily  . cefTRIAXone (ROCEPHIN)  IV  1 g Intravenous Q24H  . clonazePAM  0.25 mg Oral BID  . heparin  5,000 Units Subcutaneous Q8H  . insulin aspart  0-15 Units Subcutaneous TID WC  . insulin aspart protamine- aspart  10 Units Subcutaneous BID WC  . lisinopril  5 mg Oral Daily  . [START ON 11/13/2012] regadenoson  0.4 mg Intravenous Once  . sertraline  25 mg Oral Daily  . sodium chloride  3 mL Intravenous Q12H   Continuous Infusions:    Assessment/Plan:  Uncontrolled DM Was transiently on Insulin Gtt.  Will transition to TID qACs insulin and determine her daily required dose. Restarted 70/30 insulin mix at 10 units twice a day. Patient reported that she's taken 30 and 25 units at home Needs close followup as outpatient, patient seems to have peripheral neuropathy and bilateral foot ulcer. Currently she has no insurance, per CM she will have Medicaid in few weeks. Meanwhile she will probably be discharged on [employer] ReliOn 70/30 mix, it costs  $24.88.  Lethargy Clearly better, she'll wake, alert oriented x3, might be secondary to the benzodiazepines. She probably has anxiety because of her multiple medical conditions, she will be on Zoloft.  Hypertension -Per cardiology aggressive blood pressure control. -She will need beta blocker, ACE inhibitor and aspirin. Likely she'll benefit from statins too.  Chest pain 12-lead EKG and 3 sets of cardiac enzymes were negative for evidence of ischemia 2-D echocardiogram showed ejection fraction of 47% with grade 1 diastolic dysfunction. Continue mild diuresis.  Diabetic foot ulcer Apparently will be seeing podiatrist Dr. Raynald Kemp, on 11/14/12-currently seems uninfected.  Multiple Back surgeries- currently stable  Code Status: Full Family Communication: Updated sister and friend at bedsdie Disposition Plan:  inpatient   [facility] Pager [phone]  11/12/2012, 4:44 PM    LOS: 3 days

## 2012-11-12 NOTE — Consult Note (Addendum)
CARDIOLOGY CONSULT NOTE  Patient ID: Debbie [name] MRN: [id number], DOB/AGE: 02-14-1961   Admit date: 11/09/2012 Date of Consult: 11/12/2012  Primary Physician: No PCP Per Patient Primary Cardiologist: new - seen by P. Eden Emms, MD  Pt. Profile  52 y/o female w/o prior cardiac hx who was admitted 5/2 with dyspnea and chest pain.  Problem List  Past Medical History  Diagnosis Date  . Type II or unspecified type diabetes mellitus with unspecified complication, uncontrolled     x ~ 10 years  . Hypertension     x ~ 10 years  . Hypothyroidism   . Chest pain     a. ? MI in 2010, [place]->medically managed  . Diabetic foot ulcer   . Obesity     Past Surgical History  Procedure Laterality Date  . Back surgery      Allergies  Allergies  Allergen Reactions  . Penicillins Rash    HPI   52 y/o female with h/o htn and diabetes.  Her sister is at her bedside and serves as interpreter as the pt does not speak [place].  Pt reportedly was told that she had a MI in 2010 in [place].  Per H & P, pt has had stress tests, though upon discussing this today, pt/sister are clear that she has never had any testing related to her heart.  There is a hospital in [place] that attends to such things however, pts sister says that most commoners are not treated there.  Pts sister assisted the [place] [employer] in [place] as an interpreter and she herself moved to the [place] in January.  She is in the process of getting all of her family here, and her sister, the patient, came over in April.  Since her arrival in the [place], [place], has c/o fatigue, dyspnea, and a variety of pains including lbp, chest pain, and bilat lower ext pain and tenderness.  She has had 4 ER visits [place] with negative CTA of the chest and neg CT of the Head on 4/23.  Unfortunately, she has continued to c/o weakness, dyspnea, and chest pain, prompting her sister to bring her back to the ED on 5/2.  CT of the abd and pelvis was  performed and was non-acute.  She was admitted by IM, and has r/o for MI.  ECG shows no acute st/t changes.  An echocardiogram was performed yesterday and showed an EF of 45-50% with diff HK and grade I DD.  As she continues to report low-grade left chest/breast pain, that is worse with palpation and certain position changes, along with her h/o poorly controlled DM and htn, we've been asked to eval.  She has not had pnd, orthopnea, n, v, dizziness, syncope, or edema.  Inpatient Medications  . atenolol  50 mg Oral Daily  . cefTRIAXone (ROCEPHIN)  IV  1 g Intravenous Q24H  . clonazePAM  0.25 mg Oral BID  . heparin  5,000 Units Subcutaneous Q8H  . insulin aspart  0-15 Units Subcutaneous TID WC  . insulin aspart protamine- aspart  10 Units Subcutaneous BID WC  . lisinopril  5 mg Oral Daily  . sertraline  25 mg Oral Daily  . sodium chloride  3 mL Intravenous Q12H   Family History Family History  Problem Relation Age of Onset  . Diabetes Father     died in his 40's?  . Diabetes Father     died in her 24's?  . Diabetes Sister  alive and well.    Social History History   Social History  . Marital Status: Single    Spouse Name: N/A    Number of Children: N/A  . Years of Education: N/A   Occupational History  . Not on file.   Social History Main Topics  . Smoking status: Never Smoker   . Smokeless tobacco: Not on file  . Alcohol Use: No  . Drug Use: No  . Sexually Active: Not on file   Other Topics Concern  . Not on file   Social History Narrative   From [place], [place].  Moved to [place] to live with sister in April of 2014.  She does not routinely exercise.    Review of Systems  General:  No chills, fever, night sweats or weight changes.  Cardiovascular:  +++ chest pain, +++ dyspnea on exertion, no edema, orthopnea, palpitations, paroxysmal nocturnal dyspnea. Dermatological: No rash, lesions/masses Respiratory: No cough, +++ dyspnea Urologic: No hematuria,  dysuria Abdominal:   No nausea, vomiting, diarrhea, bright red blood per rectum, melena, or hematemesis Neurologic:  No visual changes, wkns, changes in mental status. MSK:  She has had R lower back pain. All other systems reviewed and are otherwise negative except as noted above.  Physical Exam  Blood pressure 127/81, pulse 62, temperature 98.2 F (36.8 C), temperature source Oral, resp. rate 18, height 5\' 5"  (1.651 m), weight 220 lb (99.791 kg), SpO2 98.00%.  General: Pleasant, NAD Psych: Normal affect. Neuro: Alert and oriented X 3. Moves all extremities spontaneously. HEENT: Normal  Neck: Supple without bruits or JVD. Lungs:  Resp regular, shallow respirations - clear. Heart: RRR no s3, s4, or murmurs. Abdomen: Soft, non-distended, BS + x 4. She is tender across her upper abdomen, especially the LUQ.  Her upper chest wall/sternal area is also mildly tender. Extremities: No clubbing, cyanosis or edema. DP/PT non-palpable.    Labs   Recent Labs  11/09/12 1734 11/09/12 2313 11/10/12 0620 11/10/12 1020  TROPONINI <0.30 <0.30 <0.30 <0.30   Lab Results  Component Value Date   WBC 7.0 11/11/2012   HGB 12.9 11/11/2012   HCT 38.1 11/11/2012   MCV 79.5 11/11/2012   PLT 218 11/11/2012     Recent Labs Lab 11/11/12 0438  NA 140  K 3.7  CL 107  CO2 23  BUN 12  CREATININE 0.61  CALCIUM 8.9  PROT 5.7*  BILITOT 0.4  ALKPHOS 76  ALT 30  AST 25  GLUCOSE 167*   Radiology/Studies  Ct Abdomen Pelvis Wo Contrast  11/09/2012  *RADIOLOGY REPORT*  Clinical Data: Right flank pain  CT ABDOMEN AND PELVIS WITHOUT CONTRAST   IMPRESSION:  1.  No nephrolithiasis.  No hydronephrosis or hydroureter. 2.  No calcified ureteral calculi are noted. 3.  Normal appendix.  No pericecal inflammation. 4.  Degenerative changes lumbar spine and five-vessel level.   Original Report Authenticated By: Natasha Mead, M.D.    Dg Chest 2 View  11/09/2012  *RADIOLOGY REPORT*  Clinical Data: Hyperglycemia  CHEST - 2  VIEW  Comparison: 10/31/2012  Findings: Cardiomediastinal silhouette is stable.  No acute infiltrate or pleural effusion.  No pulmonary edema.  Bony thorax is unremarkable.  IMPRESSION: No active disease.   Original Report Authenticated By: Natasha Mead, M.D.    Ct Angio Chest Pe W/cm &/or Wo Cm  10/31/2012  *RADIOLOGY REPORT*  Clinical Data: 52 year old female with sudden onset shortness of breath.  Syncope.  Hypoxia.  CT ANGIOGRAPHY CHEST  IMPRESSION:  1.  Intermittent respiratory motion artifact. No evidence of acute pulmonary embolus. 2.  Pulmonary atelectasis. 3.  Nearly 2 cm right thyroid nodule is partially calcified. Consider further evaluation with nonemergent thyroid ultrasound. If the patient is clinically hyperthyroid, consider nuclear medicine thyroid scan.   Original Report Authenticated By: Erskine Speed, M.D.    Carotid U/S 5.3.2014 Bilateral carotid artery duplex: No evidence of hemodynamically significant internal carotid artery stenosis. Vertebral artery flow is antegrade.  _____________  2D Echocardiogram 5.4.2014 Study Conclusions  - Left ventricle: The cavity size was at the upper limits of   normal. Systolic function was mildly reduced. The   estimated ejection fraction was in the range of 45% to   50%. Diffuse hypokinesis. Doppler parameters are   consistent with abnormal left ventricular relaxation   (grade 1 diastolic dysfunction). - Aortic valve: There was no stenosis. - Mitral valve: No significant regurgitation. - Right ventricle: Poorly visualized. The cavity size was   normal. Systolic function was normal. - Pulmonary arteries: No complete TR doppler jet so unable   to estimate PA systolic pressure. - Inferior vena cava: The vessel was normal in size; the   respirophasic diameter changes were in the normal range (=   50%); findings are consistent with normal central venous   pressure. _____________  ECG  Rsr, 84, lad, poor r prog.  ASSESSMENT AND PLAN  1.   Chest Pain:  History is somewhat difficult due to language barrier, though pt has been having intermittent chest pain and dyspnea since arriving in the [place] in April.  It has been constant since her admission on 5/2, and is currently very mild.  It is worse with palpation of the chest and left breast/LUQ.  Her CE are negative and ECG is w/o acute st/t changes.  It is doubtful that her current Ss represent ischemic heart disease.  That said, she does have a reported h/o medically managed MI in 2010 along with a 10 yr h/o poorly controlled DM and HTN and FH of diabetic parents dying @ young ages.  Echo shows mildly reduced EF with global HK.  Though H & P indicates that she may have undergone stress testing in the past, her sister says today that she has never had any formal testing.  We will arrange for a lexiscan myoview to r/o ischemia while she is hospitalized.  2.  HTN:  Stable on bb/acei.  3.  DM2:  Per IM.  4.  Lethargy:  Improving.  Etiology unclear.  5.  LE Pain:  Weak/absent pulses.  Will obtain ABIs.  Signed, Nicolasa Ducking, NP 11/12/2012, 2:56 PM   As above, patient seen and examined. Briefly she is a 52 year old female from [place] with a history of hypertension and diabetes who am asked to evaluate for chest pain. Note she speaks no [place] and the history is extremely difficult. Her sister interprets. Patient was admitted on May 2 with complaints of fatigue, chest pain and dyspnea. She has had multiple other complaints as well including eye pain, back pain and lower extremity pain. She ruled out for myocardial infarction. Electrocardiogram shows sinus rhythm, left axis deviation and nonspecific ST changes. Echocardiogram shows an ejection fraction of 45-50% and grade 1 diastolic dysfunction. Because of the above we were asked to evaluate. Also note her distal pulses are diminished. The history is very difficult due to language barrier. Her symptoms are very atypical. She tells me that her  chest pain has been present for 4 years  continuously. There is a question of a prior myocardial infarction. Plan functional study for risk stratification. I would recommend checking ABIs with Dopplers for lower extremity arterial disease. Would treat with aspirin and continue beta blocker and ACE inhibitor. She would benefit long-term from a statin. I will leave this to primary care. Olga Millers 3:36 PM

## 2012-11-12 NOTE — Progress Notes (Addendum)
Inpatient Diabetes Program Recommendations  AACE/ADA: New Consensus Statement on Inpatient Glycemic Control (2013)  Target Ranges:  Prepandial:   less than 140 mg/dL      Peak postprandial:   less than 180 mg/dL (1-2 hours)      Critically ill patients:  140 - 180 mg/dL    Results for [name], Debbie [name] (MRN [id number]) as of 11/12/2012 13:23  Ref. Range 11/12/2012 07:47 11/12/2012 09:58 11/12/2012 11:35  Glucose-Capillary Latest Range: 70-99 mg/dL 409 (H) 811 (H) 914 (H)   Noted 70/30 insulin started last night with supper.  Noted patient's home 70/30 insulin doses are 30 units in the AM and 25 units with supper.  Noted patient currently has no health insurance (is self-pay at present).  Will need 70/30 insulin at d/c so she can purchase it at [facility] for $25 per vial.   Inpatient Diabetes Program Recommendations Insulin - Basal: MD- Please consider increasing 70/30 insulin to 20 units bid with breakfast and supper   Will follow. Ambrose Finland RN, MSN, CDE Diabetes Coordinator Inpatient Diabetes Program [phone]

## 2012-11-13 ENCOUNTER — Inpatient Hospital Stay (HOSPITAL_COMMUNITY): Payer: Medicaid Other

## 2012-11-13 DIAGNOSIS — M79609 Pain in unspecified limb: Secondary | ICD-10-CM

## 2012-11-13 DIAGNOSIS — R079 Chest pain, unspecified: Secondary | ICD-10-CM

## 2012-11-13 LAB — CBC
Hemoglobin: 13.3 g/dL (ref 12.0–15.0)
MCH: 26.8 pg (ref 26.0–34.0)
RBC: 4.96 MIL/uL (ref 3.87–5.11)

## 2012-11-13 LAB — BASIC METABOLIC PANEL
CO2: 28 mEq/L (ref 19–32)
GFR calc non Af Amer: 81 mL/min — ABNORMAL LOW (ref >90)
Glucose, Bld: 331 mg/dL — ABNORMAL HIGH (ref 70–99)
Potassium: 4.8 mEq/L (ref 3.5–5.1)
Sodium: 139 mEq/L (ref 135–145)

## 2012-11-13 MED ORDER — TECHNETIUM TC 99M SESTAMIBI GENERIC - CARDIOLITE
10.0000 | Freq: Once | INTRAVENOUS | Status: AC | PRN
  Administered 2012-11-13: 10 via INTRAVENOUS

## 2012-11-13 MED ORDER — METFORMIN HCL 1000 MG PO TABS: 1000.0000 mg | ORAL_TABLET | Freq: Two times a day (BID) | ORAL | Status: DC

## 2012-11-13 MED ORDER — LISINOPRIL 5 MG PO TABS: 5.0000 mg | ORAL_TABLET | Freq: Every day | ORAL | Status: DC

## 2012-11-13 MED ORDER — ASPIRIN 81 MG PO CHEW: 81.0000 mg | CHEWABLE_TABLET | Freq: Every day | ORAL | Status: DC

## 2012-11-13 MED ORDER — CIPROFLOXACIN HCL 500 MG PO TABS: 500.0000 mg | ORAL_TABLET | Freq: Two times a day (BID) | ORAL | Status: DC

## 2012-11-13 MED ORDER — SERTRALINE HCL 25 MG PO TABS: 25.0000 mg | ORAL_TABLET | Freq: Every day | ORAL | Status: DC

## 2012-11-13 MED ORDER — INSULIN NPH ISOPHANE & REGULAR (70-30) 100 UNIT/ML ~~LOC~~ SUSP: 25.0000 [IU] | Freq: Two times a day (BID) | SUBCUTANEOUS | Status: DC

## 2012-11-13 MED ORDER — TECHNETIUM TC 99M SESTAMIBI GENERIC - CARDIOLITE: 30.0000 | Freq: Once | INTRAVENOUS | Status: DC | PRN

## 2012-11-13 MED ORDER — ATENOLOL 50 MG PO TABS: 50.0000 mg | ORAL_TABLET | Freq: Every day | ORAL | Status: DC

## 2012-11-13 NOTE — Progress Notes (Signed)
Lexiscan Myoview study was normal with no ischemia, EF 66%. Unclear if echo EF was less accurate (that was technically difficult study). Dr. Excell Seltzer plans to manage medically. She is maintained on BB and ACEI. Other medical problems per primary team. Patient will be made aware of result when she arrives back to floor (discussed with nursing). Dayna Dunn PA-C

## 2012-11-13 NOTE — Progress Notes (Signed)
    Subjective:  C/O shortness of breath, left chest pain over focal area under left breast, leg pain.  Objective:  Vital Signs in the last 24 hours: Temp:  [97.9 F (36.6 C)-98.2 F (36.8 C)] 98.2 F (36.8 C) (05/06 0503) Pulse Rate:  [60-72] 72 (05/06 1020) Resp:  [18-20] 18 (05/06 0503) BP: (101-136)/(44-81) 136/59 mmHg (05/06 1020) SpO2:  [95 %-100 %] 100 % (05/06 1020)  Intake/Output from previous day: 05/05 0701 - 05/06 0700 In: 720 [P.O.:720] Out: -   Physical Exam: Pt is alert and oriented, obese woman in NAD, odd affect HEENT: normal Neck: JVP - normal, carotids 2+= without bruits Lungs: CTA bilaterally CV: RRR without murmur or gallop Abd: soft, NT, Positive BS Ext: no C/C/E, areas of ulceration right 4th and left 5th toe  Lab Results:  Recent Labs  11/11/12 0438 11/13/12 0405  WBC 7.0 8.9  HGB 12.9 13.3  PLT 218 261    Recent Labs  11/11/12 0438 11/13/12 0405  NA 140 139  K 3.7 4.8  CL 107 102  CO2 23 28  GLUCOSE 167* 331*  BUN 12 18  CREATININE 0.61 0.82   No results found for this basename: TROPONINI, CK, MB,  in the last 72 hours  Cardiac Studies: 2D Echo: Study Conclusions  - Left ventricle: The cavity size was at the upper limits of normal. Systolic function was mildly reduced. The estimated ejection fraction was in the range of 45% to 50%. Diffuse hypokinesis. Doppler parameters are consistent with abnormal left ventricular relaxation (grade 1 diastolic dysfunction). - Aortic valve: There was no stenosis. - Mitral valve: No significant regurgitation. - Right ventricle: Poorly visualized. The cavity size was normal. Systolic function was normal. - Pulmonary arteries: No complete TR doppler jet so unable to estimate PA systolic pressure. - Inferior vena cava: The vessel was normal in size; the respirophasic diameter changes were in the normal range (= 50%); findings are consistent with normal central  venous pressure. Impressions:  - Technically difficult study with poor acoustic windows. Definity echo contrast may have helped but was not used (if study repeated in the future, would use contrast). Normal LV size with mild global hypokinesis, EF 45-50%. Normal RV size and systolic function. No significant valvular abnormalities.  Assessment/Plan:  1. Atypical chest pain - uncontrolled cardiac risk factors. Agree with Myoview stress nuclear scan in setting of mild LV dysfunction. She has a multitude of symptoms, many of which are clearly unrelated to cardiac disease, but she is at significant risk.  2. Foot ulceration - suspect multifactorial - diabetes, PAD. Agree with ABI's.  3. Type 2 DM - uncontrolled with HgB A1C 11.9. Per IM service  Will follow-up after Myoview completed.  Tonny Bollman, M.D. 11/13/2012, 10:46 AM

## 2012-11-13 NOTE — Progress Notes (Signed)
Lexiscan myoview completed. Patient's sister was there to assist with translating and consent process. Patient tolerated without complication. Dayna Dunn PA-C

## 2012-11-13 NOTE — Progress Notes (Signed)
VASCULAR LAB PRELIMINARY  ARTERIAL  ABI completed:    RIGHT    LEFT    PRESSURE WAVEFORM  PRESSURE WAVEFORM  BRACHIAL 139 Triphasic BRACHIAL 129 Triphasic  DP 112 Severely dampened monophasic DP 131 Dampened monophasic  AT   AT    PT Noncompressible Monophasic PT  Unable to insonate.  PER   PER    GREAT TOE  NA GREAT TOE  NA    RIGHT LEFT  ABI 0.81 0.94   The right dorsalis pedis ABI is suggestive of mild arterial insufficiency. The right posterior tibial artery is noncompressible. The left dorsalis pedis ABI is within normal limits. Unable to insonate the left posterior tibial artery.  11/13/2012 1:44 PM Gertie Fey, RDMS, RDCS

## 2012-11-13 NOTE — Discharge Summary (Signed)
Physician Discharge Summary  Debbie [name] ZOX:[id number] DOB: 1961-02-24 DOA: 11/09/2012  PCP: No PCP Per Patient  Admit date: 11/09/2012 Discharge date: 11/13/2012  Time spent: 40 minutes  Recommendations for Outpatient Follow-up:  1. Followup with primary care physician in one week. 2. Followup with podiatrist tomorrow.  Discharge Diagnoses:  Principal Problem:   Chest pain Active Problems:   Diabetes mellitus type 2 with peripheral artery disease   Type II or unspecified type diabetes mellitus with unspecified complication, uncontrolled   HTN (hypertension)   Tachypnea   Adjustment disorder with mixed anxiety and depressed mood   Discharge Condition: Stable  Diet recommendation: Carbohydrate modified diet  Filed Weights   11/10/12 0038 11/12/12 0900  Weight: 101.56 kg (223 lb 14.4 oz) 99.791 kg (220 lb)    History of present illness:  Debbie [name] is a 52 y.o. female who presents to the ED with multiple complaints. She has very poor interaction and history taking is difficult further complicated by the fact that she does not speak english very well, a family member is acting as Nurse, learning disability. Among her complaints include chest pain, radiating to her neck, occuring at rest and waking her up from sleep at times. Her chest pain is associated with shortness of breath. Her SOB has been limiting her activity, she becomes SOB if she tries to walk across a room. On history taking she states she had a heart attack in 2010, but no stenting nor CABG. She states her last stress test was in 2011 (both the heart attack and stress test were done in [place], [place]). Her other cardiac risk factors include DM2 (poorly controlled with diabetic foot ulcers), and HTN. Hospitalist is admitting as CP rule out cardiac.  Hospital Course:   Patient speak Arabic does not speak [place].  1. Chest pain: Patient admitted to the hospital because of complaints of chest pain and initially also she had controlled  blood sugars. Patient had 3 sets of cardiac enzymes which is negative and 12 EKG showed no evidence of cardiac ischemia. Cardiology was consulted and a stress Myoview test was done and showed no evidence of ischemia with ejection fraction of 66%. Patient has negative stress test.  2. Uncontrolled diabetes mellitus type 2: Patient has hemoglobin A1c of 11.9 which correlate with mean blood glucose of 294. Patient is on NPH insulin 30 units in a.m. and 25 units at p.m. Patient was asked to continue same regimen as she mentioned she was not able to get her insulin likely because moving from [place] to the [place]. Advised to check her blood sugar at least twice a day and take her blood sugar readings to her primary care physician for further adjustment of her insulin regimen  3. Anxiety and adjustment disorder: Patient is an [place] refugee recently moved from [place] to the [place], patient said she has to stay for long time without insulin during her travel, she had long transit in [place]. Since she came in on 10/25/2012 she showed up in the emergency department 4 times. All complaints are very vague, complaints is about chronic chest pain, shortness of breath and sense of a smothering. I spoke with the patient, she seems to exaggerate a lot of her symptoms, she was seen by psychiatry and she started on Zoloft.  4. Mild diastolic dysfunction: Patient has had echocardiogram showed ejection fraction of 45-50%, grade 1 diastolic dysfunction, probably this is related to a high blood pressure. Cardiology was consulted they recommended beta blockers, ACE inhibitors  and aspirin. Patient also has had stress test which was negative. Statins also was recommended but not started, till patient gets regular checkups and establish with her primary care physician. Patient probably has high triglycerides and LDL related to her uncontrolled diabetes mellitus type 2.  5. Diabetic peripheral neuropathy: Patient has ulcers in  her both right and left fourth toe, they do not seem to be infected, she has had ABI and there was mild arterial disease in the right dorsalis pedis, left is okay.  6. UTI: Patient was complaining about burning while passing urine, repeat urinalysis showed 100,000 CFU of multiple morphotypes, patient treated with ciprofloxacin for total of 5 days.  Procedures:  Stress Myoview showed ejection fraction of 66%.  2-D echocardiogram showed ejection fraction of 45-50%  Consultations:  [facility]  Discharge Exam: Filed Vitals:   11/13/12 1112 11/13/12 1113 11/13/12 1115 11/13/12 1117  BP: 137/74 104/54 125/63 125/62  Pulse:      Temp:      TempSrc:      Resp:      Height:      Weight:      SpO2:       General: Alert and awake, oriented x3, not in any acute distress. HEENT: anicteric sclera, pupils reactive to light and accommodation, EOMI CVS: S1-S2 clear, no murmur rubs or gallops Chest: clear to auscultation bilaterally, no wheezing, rales or rhonchi Abdomen: soft nontender, nondistended, normal bowel sounds, no organomegaly Extremities: no cyanosis, clubbing or edema noted bilaterally Neuro: Cranial nerves II-XII intact, no focal neurological deficits  Discharge Instructions  Discharge Orders   Future Appointments Provider Department Dept Phone   11/14/2012 2:00 PM Myeong Eyvonne [facility] [phone]   Future Orders Complete By Expires     Diet Carb Modified  As directed     Increase activity slowly  As directed         Medication List    STOP taking these medications       ALPRAZolam 0.5 MG tablet  Commonly known as:  XANAX     glimepiride 4 MG tablet  Commonly known as:  AMARYL     glucose blood test strip      TAKE these medications       aspirin 81 MG chewable tablet  Chew 1 tablet (81 mg total) by mouth daily.     atenolol 50 MG tablet  Commonly known as:  TENORMIN  Take 1 tablet (50 mg total) by mouth daily.     ciprofloxacin  500 MG tablet  Commonly known as:  CIPRO  Take 1 tablet (500 mg total) by mouth 2 (two) times daily.     insulin NPH-regular (70-30) 100 UNIT/ML injection  Commonly known as:  NOVOLIN 70/30  Inject 25-30 Units into the skin 2 (two) times daily. Take 30 units every morning and 25 units at dinner. Refill x 1.     lisinopril 5 MG tablet  Commonly known as:  PRINIVIL,ZESTRIL  Take 1 tablet (5 mg total) by mouth daily.     metFORMIN 1000 MG tablet  Commonly known as:  GLUCOPHAGE  Take 1 tablet (1,000 mg total) by mouth 2 (two) times daily with a meal.     sertraline 25 MG tablet  Commonly known as:  ZOLOFT  Take 1 tablet (25 mg total) by mouth daily.       Allergies  Allergen Reactions  . Penicillins Rash       Follow-up Information  Follow up with [facility]  On 11/16/2012. (4:30pm, copay cost $20)    Contact information:   [facility]  [address]  [place], [place] [address] Phone: [phone]        The results of significant diagnostics from this hospitalization (including imaging, microbiology, ancillary and laboratory) are listed below for reference.    Significant Diagnostic Studies: Ct Abdomen Pelvis Wo Contrast  11/09/2012  *RADIOLOGY REPORT*  Clinical Data: Right flank pain  CT ABDOMEN AND PELVIS WITHOUT CONTRAST  Technique:  Multidetector CT imaging of the abdomen and pelvis was performed following the standard protocol without intravenous contrast.  Comparison:  Findings: The sagittal images of the spine shows disc space flattening with vacuum disc phenomenon at L5-S1 level.  Lung bases are unremarkable.  There are streak artifacts from the patient's body habitus.  Unenhanced liver, spleen, pancreas and adrenals are unremarkable.  No calcified gallstones are noted within gallbladder.  No nephrolithiasis.  No calcified ureteral calculi are noted.  The unenhanced kidneys shows no hydronephrosis or hydroureter.  No aortic  aneurysm.  No small bowel obstruction.  No ascites or free air.  No adenopathy.  No pericecal inflammation. Normal appendix is clearly visualized in axial image 60.  Unenhanced uterus and adnexa are unremarkable.  Bilateral distal ureter is unremarkable.  No calcified calculi are noted within urinary bladder.  No pelvic ascites or adenopathy.  Pelvic phleboliths are noted.  No destructive bony lesions are noted within pelvis.  No inguinal adenopathy.  Moderate stool noted in the right colon and transverse colon.  IMPRESSION:  1.  No nephrolithiasis.  No hydronephrosis or hydroureter. 2.  No calcified ureteral calculi are noted. 3.  Normal appendix.  No pericecal inflammation. 4.  Degenerative changes lumbar spine and five-vessel level.   Original Report Authenticated By: Natasha Mead, M.D.    Dg Chest 2 View  11/09/2012  *RADIOLOGY REPORT*  Clinical Data: Hyperglycemia  CHEST - 2 VIEW  Comparison: 10/31/2012  Findings: Cardiomediastinal silhouette is stable.  No acute infiltrate or pleural effusion.  No pulmonary edema.  Bony thorax is unremarkable.  IMPRESSION: No active disease.   Original Report Authenticated By: Natasha Mead, M.D.    Dg Chest 2 View  10/26/2012  *RADIOLOGY REPORT*  Clinical Data: Short of breath.  Cough.  Chest pain.  Hypertension and diabetes.  CHEST - 2 VIEW  Comparison: None.  Findings: Low lung volumes are seen with mild bibasilar atelectasis.  Respiratory motion also noted on the lateral projection.  No evidence of pulmonary consolidation or congestive heart failure.  No evidence of pleural effusion.  Heart size is within normal limits.  IMPRESSION: Low lung volumes and mild bibasilar atelectasis.   Original Report Authenticated By: Myles Rosenthal, M.D.    Ct Head Wo Contrast  10/31/2012  *RADIOLOGY REPORT*  Clinical Data: Mental status change  CT HEAD WITHOUT CONTRAST  Technique:  Contiguous axial images were obtained from the base of the skull through the vertex without contrast.   Comparison: CT 10/31/2012  Findings: Mild to moderate atrophy for age.  Ventricles are not enlarged.  Negative for acute infarct, hemorrhage, or mass lesion. No change from the prior study.  IMPRESSION: Generalized atrophy.  No acute abnormality.   Original Report Authenticated By: Janeece Riggers, M.D.    Ct Angio Chest Pe W/cm &/or Wo Cm  10/31/2012  *RADIOLOGY REPORT*  Clinical Data: 52 year old female with sudden onset shortness of breath.  Syncope.  Hypoxia.  CT ANGIOGRAPHY CHEST  Technique:  Multidetector CT imaging of the chest using the standard protocol during bolus administration of intravenous contrast. Multiplanar reconstructed images including MIPs were obtained and reviewed to evaluate the vascular anatomy.  Contrast: OMNIPAQUE IOHEXOL 350 MG/ML SOLN  Comparison: Chest radiographs earlier the same day.  Findings: Good contrast bolus timing in the pulmonary arterial tree.  Mild respiratory motion artifact intermittently occurs. Allowing for this, No focal filling defect identified in the pulmonary arterial tree to suggest the presence of acute pulmonary embolism.  Atelectatic changes intermittently noted in the major airways. Dependent pulmonary atelectasis.  No pericardial or pleural effusion.  Small nodule incidentally noted along the right minor fissure compatible with post inflammatory etiology.  Negative lung parenchyma otherwise.  No acute osseous abnormality identified.    No mediastinal lymphadenopathy.  Partially calcified right thyroid lobe nodule measuring 19 mm.  Negative visualized aorta.  Negative visualized upper abdominal viscera.  IMPRESSION:  1.  Intermittent respiratory motion artifact. No evidence of acute pulmonary embolus. 2.  Pulmonary atelectasis. 3.  Nearly 2 cm right thyroid nodule is partially calcified. Consider further evaluation with nonemergent thyroid ultrasound. If the patient is clinically hyperthyroid, consider nuclear medicine thyroid scan.   Original Report  Authenticated By: Erskine Speed, M.D.    Nm Myocar Multi W/spect W/wall Motion / Ef  11/13/2012  Lexiscan Myovue:  Indication Chest Pain:  The patient received .4 mg of lexiscan as a bolus. Resting ECG showed NSR With poor R wave progression. There were no changes with infusion BP stable at 125/62 mmHg.  Patient had isolated PVC;s. Mild dsypnea  Images reconstructed in short axis, vertical and horizontal planes. RAW images showed some motion artifact. QPS 0 No ischemia or infarction. Surface images normal with EF 65%  Impression: Normal lexiscan myovue EF 65%  Charlton Haws MD [facility]   Original Report Authenticated By: Charlton Haws, M.D.    Dg Chest Port 1 View  10/31/2012  *RADIOLOGY REPORT*  Clinical Data: Short of breath and syncope  PORTABLE CHEST - 1 VIEW  Comparison: 10/26/2012  Findings: Lung volume is normal.  Lungs are clear without infiltrate or effusion.  Negative for heart failure or mass.  IMPRESSION: No acute abnormality.   Original Report Authenticated By: Janeece Riggers, M.D.    Dg Foot Complete Left  10/26/2012  *RADIOLOGY REPORT*  Clinical Data: Ulcers.  Diabetic.  LEFT FOOT - COMPLETE 3+ VIEW  Comparison: None.  Findings: No acute bony abnormality.  Specifically, no fracture, subluxation, or dislocation.  Soft tissues are intact.  No radiographic changes to suggest osteomyelitis.  Dense vascular calcifications are present.  IMPRESSION: No acute bony abnormality.   Original Report Authenticated By: Charlett Nose, M.D.    Dg Foot Complete Right  10/26/2012  *RADIOLOGY REPORT*  Clinical Data: Hyperglycemia.  Ulcer.  Diabetic.  RIGHT FOOT COMPLETE - 3+ VIEW  Comparison: None  Findings: Degenerative changes in the right first MTP joint with joint space narrowing and spurring. No acute bony abnormality. Specifically, no fracture, subluxation, or dislocation.  Soft tissues are intact.  No radiographic changes of osteomyelitis. Dense vascular calcifications present.  IMPRESSION: No acute bony  abnormality.   Original Report Authenticated By: Charlett Nose, M.D.     Microbiology: Recent Results (from the past 240 hour(s))  URINE CULTURE     Status: None   Collection Time    11/11/12  2:35 PM      Result Value Range Status   Specimen Description URINE, CLEAN  CATCH   Final   Special Requests NONE   Final   Culture  Setup Time 11/11/2012 21:02   Final   Colony Count >=100,000 COLONIES/ML   Final   Culture     Final   Value: Multiple bacterial morphotypes present, none predominant. Suggest appropriate recollection if clinically indicated.   Report Status 11/12/2012 FINAL   Final     Labs: Basic Metabolic Panel:  Recent Labs Lab 11/09/12 1706 11/10/12 0620 11/11/12 0438 11/13/12 0405  NA 134* 140 140 139  K 3.8 3.4* 3.7 4.8  CL 99 107 107 102  CO2 22 26 23 28   GLUCOSE 447* 163* 167* 331*  BUN 13 12 12 18   CREATININE 0.63 0.67 0.61 0.82  CALCIUM 9.6 8.4 8.9 9.4   Liver Function Tests:  Recent Labs Lab 11/11/12 0438  AST 25  ALT 30  ALKPHOS 76  BILITOT 0.4  PROT 5.7*  ALBUMIN 2.8*   No results found for this basename: LIPASE, AMYLASE,  in the last 168 hours  Recent Labs Lab 11/10/12 1018  AMMONIA <10*   CBC:  Recent Labs Lab 11/09/12 1706 11/10/12 0620 11/11/12 0438 11/13/12 0405  WBC 9.3 8.3 7.0 8.9  NEUTROABS 4.7  --   --   --   HGB 15.4* 13.0 12.9 13.3  HCT 43.3 38.2 38.1 39.2  MCV 77.0* 78.3 79.5 79.0  PLT 240 223 218 261   Cardiac Enzymes:  Recent Labs Lab 11/09/12 1734 11/09/12 2313 11/10/12 0620 11/10/12 1020  TROPONINI <0.30 <0.30 <0.30 <0.30   BNP: BNP (last 3 results)  Recent Labs  11/12/12 0545  PROBNP 113.9   CBG:  Recent Labs Lab 11/12/12 1135 11/12/12 1627 11/12/12 2120 11/13/12 0722 11/13/12 1348  GLUCAP 303* 223* 176* 281* 328*       Signed:  Hanz Winterhalter A  [facility] 11/13/2012, 7:15 PM

## 2012-11-13 NOTE — Progress Notes (Signed)
Pt. Being helped off Nuc. Med camera by tach when she started holding throat area and appeared stressed. Sister who is serving as interpreter said pt. Having difficulty breathing. Pt. Would not stand up with assistance. Transferred pt. To stretcher. Vitals: 72-20- 136/59 and o2 sat 100% on room air. Dr. Excell Seltzer in to see pt.  He was notified about episode.

## 2012-11-14 ENCOUNTER — Ambulatory Visit: Payer: Self-pay | Admitting: Podiatry

## 2012-11-16 ENCOUNTER — Encounter (HOSPITAL_COMMUNITY): Payer: Self-pay

## 2012-11-16 ENCOUNTER — Emergency Department (INDEPENDENT_AMBULATORY_CARE_PROVIDER_SITE_OTHER)
Admission: EM | Admit: 2012-11-16 | Discharge: 2012-11-16 | Disposition: A | Discharge status: Home / Self Care | Payer: Self-pay | Source: Home / Self Care

## 2012-11-16 DIAGNOSIS — F4323 Adjustment disorder with mixed anxiety and depressed mood: Secondary | ICD-10-CM

## 2012-11-16 DIAGNOSIS — L97509 Non-pressure chronic ulcer of other part of unspecified foot with unspecified severity: Secondary | ICD-10-CM

## 2012-11-16 DIAGNOSIS — I10 Essential (primary) hypertension: Secondary | ICD-10-CM

## 2012-11-16 DIAGNOSIS — E1159 Type 2 diabetes mellitus with other circulatory complications: Secondary | ICD-10-CM

## 2012-11-16 DIAGNOSIS — I739 Peripheral vascular disease, unspecified: Secondary | ICD-10-CM

## 2012-11-16 DIAGNOSIS — E1151 Type 2 diabetes mellitus with diabetic peripheral angiopathy without gangrene: Secondary | ICD-10-CM

## 2012-11-16 DIAGNOSIS — E11621 Type 2 diabetes mellitus with foot ulcer: Secondary | ICD-10-CM

## 2012-11-16 DIAGNOSIS — E1169 Type 2 diabetes mellitus with other specified complication: Secondary | ICD-10-CM

## 2012-11-16 NOTE — ED Notes (Signed)
Hospital follow up History of DM Thyroid issue

## 2012-11-16 NOTE — ED Provider Notes (Signed)
History     CSN: [id number]  Arrival date & time 11/16/12  1313   First MD Initiated Contact with Patient 11/16/12 1338      Chief Complaint  Patient presents with  . Follow-up    (Consider location/radiation/quality/duration/timing/severity/associated sxs/prior treatment) HPI 52 year old female with past medical history of diabetes, hypertension, depression, diabetic foot ulcer who presented to clinic for followup after recent hospitalization to [facility] for hyperglycemia and shortness of breath. Patient was not in DKA at that time and her cardiac enzymes were normal. At this time patient is doing well. She does have a diabetic foot ulcer, fourth right toe but without stigmata of infection. No complaints of chest pain, shortness of breath or palpitations. No abdominal pain, nausea or vomiting. No blurred vision or dizziness or loss of consciousness.   Past Medical History  Diagnosis Date  . Type II or unspecified type diabetes mellitus with unspecified complication, uncontrolled     x ~ 10 years  . Hypertension     x ~ 10 years  . Hypothyroidism   . Chest pain     a. ? MI in 2010, [place]->medically managed  . Diabetic foot ulcer   . Obesity     Past Surgical History  Procedure Laterality Date  . Back surgery      Family History  Problem Relation Age of Onset  . Diabetes Father     died in his 57's?  . Diabetes Father     died in her 46's?  . Diabetes Sister     alive and well.    History  Substance Use Topics  . Smoking status: Never Smoker   . Smokeless tobacco: Not on file  . Alcohol Use: No    OB History   Grav Para Term Preterm Abortions TAB SAB Ect Mult Living                  Review of Systems  Constitutional: Negative for fever, chills, diaphoresis, activity change, appetite change and fatigue.  HENT: Negative for ear pain, nosebleeds, congestion, facial swelling, rhinorrhea, neck pain, neck stiffness and ear discharge.   Eyes: Negative for  pain, discharge, redness, itching and visual disturbance.  Respiratory: Negative for cough, choking, chest tightness, shortness of breath, wheezing and stridor.   Cardiovascular: Negative for chest pain, palpitations and leg swelling.  Gastrointestinal: Negative for abdominal distention.  Genitourinary: Negative for dysuria, urgency, frequency, hematuria, flank pain, decreased urine volume, difficulty urinating and dyspareunia.  Musculoskeletal: Back pain, negative for joint swelling and gait problems  Neurological: Negative for dizziness, tremors, seizures, syncope, facial asymmetry, speech difficulty, weakness, light-headedness, numbness and headaches.  Hematological: Negative for adenopathy. Does not bruise/bleed easily.  Psychiatric/Behavioral: Negative for hallucinations, behavioral problems, confusion, dysphoric mood, decreased concentration and agitation.    Allergies  Penicillins  Home Medications   Current Outpatient Rx  Name  Route  Sig  Dispense  Refill  . aspirin 81 MG chewable tablet   Oral   Chew 1 tablet (81 mg total) by mouth daily.   30 tablet   0   . atenolol (TENORMIN) 50 MG tablet   Oral   Take 1 tablet (50 mg total) by mouth daily.   30 tablet   0   . ciprofloxacin (CIPRO) 500 MG tablet   Oral   Take 1 tablet (500 mg total) by mouth 2 (two) times daily.   10 tablet   0   . insulin NPH-regular (NOVOLIN 70/30) (70-30) 100 UNIT/ML  injection   Subcutaneous   Inject 25-30 Units into the skin 2 (two) times daily. Take 30 units every morning and 25 units at dinner. Refill x 1.   10 mL   12   . lisinopril (PRINIVIL,ZESTRIL) 5 MG tablet   Oral   Take 1 tablet (5 mg total) by mouth daily.   30 tablet   0   . metFORMIN (GLUCOPHAGE) 1000 MG tablet   Oral   Take 1 tablet (1,000 mg total) by mouth 2 (two) times daily with a meal.   30 tablet   0   . sertraline (ZOLOFT) 25 MG tablet   Oral   Take 1 tablet (25 mg total) by mouth daily.   30 tablet   0      BP 154/84  Pulse 87  Temp(Src) 98.6 F (37 C) (Oral)  Resp 17  SpO2 99%  Physical Exam  Constitutional: Appears well-developed and well-nourished. No distress.  HENT: Normocephalic. External right and left ear normal. Oropharynx is clear and moist.  Eyes: Conjunctivae and EOM are normal. PERRLA, no scleral icterus.  Neck: Normal ROM. Neck supple. No JVD. No tracheal deviation. No thyromegaly.  CVS: RRR, S1/S2 +, no murmurs, no gallops, no carotid bruit.  Pulmonary: Effort and breath sounds normal, no stridor, rhonchi, wheezes, rales.  Abdominal: Soft. BS +,  no distension, tenderness, rebound or guarding.  Musculoskeletal: Normal range of motion. No edema and no tenderness.  right first toe diabetic foot ulcer, no erythema and no discharge of the  Lymphadenopathy: No lymphadenopathy noted, cervical, inguinal. Neuro: Alert. Normal reflexes, muscle tone coordination. No cranial nerve deficit. Skin: Skin is warm and dry. No rash noted. Not diaphoretic. No erythema. No pallor.  Psychiatric: Normal mood and affect. Behavior, judgment, thought content normal.    ED Course  Procedures (including critical care time)  Labs Reviewed - No data to display No results found.   1. Diabetes mellitus type 2 with peripheral artery disease  - Patient will continue taking her home medications which include metformin thousand milligrams twice a day as well as sliding scale Novolin  - I have spoke extensively with the patient and her family in regards to proper followup for diabetic foot care as well as eye care  2. HTN (hypertension)  - Patient will continue taking lisinopril and atenolol   3. Diabetic foot ulcer  - Patient has missed appointment with foot care clinic . Patient's sister will reschedule the appointment.   4. Back pain - Patient has report from imaging studies back in [place] with lumbar spine stenosis. We will obtain MRI of lumbar spine.    MDM  Diabetes  mellitus Hypertension        Alison Murray, MD 11/16/12 1511

## 2012-11-20 ENCOUNTER — Ambulatory Visit (HOSPITAL_COMMUNITY)
Admit: 2012-11-20 | Discharge: 2012-11-20 | Disposition: A | Discharge status: Home / Self Care | Payer: Medicaid Other | Source: Ambulatory Visit | Attending: Internal Medicine | Admitting: Internal Medicine

## 2012-11-20 DIAGNOSIS — M545 Low back pain, unspecified: Secondary | ICD-10-CM | POA: Insufficient documentation

## 2012-11-20 DIAGNOSIS — M5126 Other intervertebral disc displacement, lumbar region: Secondary | ICD-10-CM | POA: Insufficient documentation

## 2012-11-20 DIAGNOSIS — M79609 Pain in unspecified limb: Secondary | ICD-10-CM | POA: Insufficient documentation

## 2012-11-23 ENCOUNTER — Encounter (HOSPITAL_COMMUNITY): Payer: Self-pay | Admitting: Emergency Medicine

## 2012-11-23 ENCOUNTER — Emergency Department (INDEPENDENT_AMBULATORY_CARE_PROVIDER_SITE_OTHER)
Admission: EM | Admit: 2012-11-23 | Discharge: 2012-11-23 | Disposition: A | Discharge status: Home / Self Care | Payer: Medicaid Other | Source: Home / Self Care

## 2012-11-23 DIAGNOSIS — E1169 Type 2 diabetes mellitus with other specified complication: Secondary | ICD-10-CM

## 2012-11-23 DIAGNOSIS — L97509 Non-pressure chronic ulcer of other part of unspecified foot with unspecified severity: Secondary | ICD-10-CM

## 2012-11-23 DIAGNOSIS — R7309 Other abnormal glucose: Secondary | ICD-10-CM

## 2012-11-23 DIAGNOSIS — E119 Type 2 diabetes mellitus without complications: Secondary | ICD-10-CM

## 2012-11-23 DIAGNOSIS — E11621 Type 2 diabetes mellitus with foot ulcer: Secondary | ICD-10-CM

## 2012-11-23 DIAGNOSIS — R739 Hyperglycemia, unspecified: Secondary | ICD-10-CM

## 2012-11-23 MED ORDER — DOXYCYCLINE HYCLATE 100 MG PO CAPS: 100.0000 mg | ORAL_CAPSULE | Freq: Two times a day (BID) | ORAL | Status: DC

## 2012-11-23 NOTE — ED Notes (Signed)
Scheduled an appointment with Arna Medici at acc for 11:15 on Nov 28, 2012.

## 2012-11-23 NOTE — ED Notes (Signed)
Patient is here today for sores on toes.  Patient has multiple complaints.  Has only been in [place] 3 weeks.  Multiple ed visits and hospitalization.  Patient has been seen by acc.  Unsure of case managers involvement in assisting family in maintaining pcp relationship.  Sore on little left toe and 4th toe on right foot.

## 2012-11-23 NOTE — ED Provider Notes (Signed)
Medical screening examination/treatment/procedure(s) were performed by resident physician or non-physician practitioner and as supervising physician I was immediately available for consultation/collaboration.   KINDL,JAMES DOUGLAS MD.   James D Kindl, MD 11/23/12 2045 

## 2012-11-23 NOTE — ED Provider Notes (Signed)
History     CSN: [id number]  Arrival date & time 11/23/12  1403   First MD Initiated Contact with Patient 11/23/12 1450      Chief Complaint  Patient presents with  . Foot Pain    (Consider location/radiation/quality/duration/timing/severity/associated sxs/prior treatment) HPI Comments: 52 year old [place] immigrant in the [place] for approximately 3 weeks presents with complaints of toe pain along with fatigue, malaise and other multiple complaints. Her primary reason for this visit today is for type pain. She has been seen in emergency department at least 4 times for same presentation and the urgent care twice. There has been little change in her toes. The right fourth toe has some skin breakdown but no evidence of necrosis or infection. There is mild swelling and see them draining from the wound which is new. Adjacent toes are tender and mildly red. Her blood sugar is 427 The patient is accompanied by 2 persons that are liasons or case managers for her.   Past Medical History  Diagnosis Date  . Type II or unspecified type diabetes mellitus with unspecified complication, uncontrolled     x ~ 10 years  . Hypertension     x ~ 10 years  . Hypothyroidism   . Chest pain     a. ? MI in 2010, [place]->medically managed  . Diabetic foot ulcer   . Obesity     Past Surgical History  Procedure Laterality Date  . Back surgery      Family History  Problem Relation Age of Onset  . Diabetes Father     died in his 91's?  . Diabetes Father     died in her 78's?  . Diabetes Sister     alive and well.    History  Substance Use Topics  . Smoking status: Never Smoker   . Smokeless tobacco: Not on file  . Alcohol Use: No    OB History   Grav Para Term Preterm Abortions TAB SAB Ect Mult Living                  Review of Systems  Constitutional: Positive for activity change and fatigue. Negative for fever.  HENT: Negative.   Respiratory: Negative for shortness of breath  and wheezing.   Cardiovascular: Negative for chest pain.  Gastrointestinal: Negative for nausea, vomiting and diarrhea.  Musculoskeletal: Positive for myalgias and arthralgias.  Skin: Positive for color change.  Neurological: Negative.     Allergies  Penicillins  Home Medications   Current Outpatient Rx  Name  Route  Sig  Dispense  Refill  . aspirin 81 MG chewable tablet   Oral   Chew 1 tablet (81 mg total) by mouth daily.   30 tablet   0   . atenolol (TENORMIN) 50 MG tablet   Oral   Take 1 tablet (50 mg total) by mouth daily.   30 tablet   0   . ciprofloxacin (CIPRO) 500 MG tablet   Oral   Take 1 tablet (500 mg total) by mouth 2 (two) times daily.   10 tablet   0   . doxycycline (VIBRAMYCIN) 100 MG capsule   Oral   Take 1 capsule (100 mg total) by mouth 2 (two) times daily.   20 capsule   0   . insulin NPH-regular (NOVOLIN 70/30) (70-30) 100 UNIT/ML injection   Subcutaneous   Inject 25-30 Units into the skin 2 (two) times daily. Take 30 units every morning and 25 units at  dinner. Refill x 1.   10 mL   12   . lisinopril (PRINIVIL,ZESTRIL) 5 MG tablet   Oral   Take 1 tablet (5 mg total) by mouth daily.   30 tablet   0   . metFORMIN (GLUCOPHAGE) 1000 MG tablet   Oral   Take 1 tablet (1,000 mg total) by mouth 2 (two) times daily with a meal.   30 tablet   0   . sertraline (ZOLOFT) 25 MG tablet   Oral   Take 1 tablet (25 mg total) by mouth daily.   30 tablet   0     BP 143/88  Pulse 84  Temp(Src) 98.6 F (37 C) (Oral)  Resp 20  SpO2 98%  Physical Exam  Nursing note and vitals reviewed. Constitutional: She appears well-developed and well-nourished. No distress.  Eyes: EOM are normal.  Neck: Neck supple.  Cardiovascular: Normal rate.   Pulmonary/Chest: Effort normal. No respiratory distress.  Musculoskeletal: She exhibits tenderness.  Right fourth toe with minor swelling and slight darkening. No necrosis. There is an opening in the skin which  developed over the past 2 days. There is no purulence that more of a serous/sebum type drainage performing a tiny light crust. No lymphangitis  Neurological: She is alert. She exhibits normal muscle tone.  Skin: Skin is warm and dry.  Psychiatric: She has a normal mood and affect.    ED Course  Procedures (including critical care time)  Labs Reviewed  GLUCOSE, CAPILLARY - Abnormal; Notable for the following:    Glucose-Capillary 439 (*)    All other components within normal limits   No results found.   1. Diabetic foot ulcers   2. Diabetes   3. Hyperglycemia       MDM   administer 15 units of the NPH/regular insulin 70/30 when he got home. At 5 units to each morning and p.m. Dose. Doxycycline 100 mg twice a day Neosporin ointment to the toe Stop 1 tablet of the metformin and take 1000 mg with evening meal only. Keep your appointment with Dr. Laural Benes at the [facility] next week.         Hayden Rasmussen, NP 11/23/12 [phone]

## 2012-11-28 ENCOUNTER — Ambulatory Visit: Payer: Self-pay

## 2012-11-30 ENCOUNTER — Ambulatory Visit: Payer: Medicaid Other | Attending: Family Medicine | Admitting: Family Medicine

## 2012-11-30 VITALS — BP 124/68 | HR 88 | Temp 98.0°F | Resp 20 | Ht 63.75 in | Wt 215.0 lb

## 2012-11-30 DIAGNOSIS — M545 Low back pain, unspecified: Secondary | ICD-10-CM

## 2012-11-30 DIAGNOSIS — R2681 Unsteadiness on feet: Secondary | ICD-10-CM

## 2012-11-30 DIAGNOSIS — E11628 Type 2 diabetes mellitus with other skin complications: Secondary | ICD-10-CM | POA: Insufficient documentation

## 2012-11-30 DIAGNOSIS — E1169 Type 2 diabetes mellitus with other specified complication: Secondary | ICD-10-CM

## 2012-11-30 DIAGNOSIS — I10 Essential (primary) hypertension: Secondary | ICD-10-CM

## 2012-11-30 DIAGNOSIS — L97509 Non-pressure chronic ulcer of other part of unspecified foot with unspecified severity: Secondary | ICD-10-CM | POA: Insufficient documentation

## 2012-11-30 DIAGNOSIS — E1165 Type 2 diabetes mellitus with hyperglycemia: Secondary | ICD-10-CM

## 2012-11-30 DIAGNOSIS — IMO0002 Reserved for concepts with insufficient information to code with codable children: Secondary | ICD-10-CM

## 2012-11-30 DIAGNOSIS — E118 Type 2 diabetes mellitus with unspecified complications: Secondary | ICD-10-CM

## 2012-11-30 DIAGNOSIS — F4323 Adjustment disorder with mixed anxiety and depressed mood: Secondary | ICD-10-CM

## 2012-11-30 DIAGNOSIS — E1151 Type 2 diabetes mellitus with diabetic peripheral angiopathy without gangrene: Secondary | ICD-10-CM

## 2012-11-30 DIAGNOSIS — E039 Hypothyroidism, unspecified: Secondary | ICD-10-CM

## 2012-11-30 DIAGNOSIS — L089 Local infection of the skin and subcutaneous tissue, unspecified: Secondary | ICD-10-CM

## 2012-11-30 MED ORDER — INSULIN ASPART 100 UNIT/ML ~~LOC~~ SOLN
14.0000 [IU] | Freq: Once | SUBCUTANEOUS | Status: AC
  Administered 2012-11-30: 14 [IU] via SUBCUTANEOUS

## 2012-11-30 MED ORDER — INSULIN NPH ISOPHANE & REGULAR (70-30) 100 UNIT/ML ~~LOC~~ SUSP: SUBCUTANEOUS | Status: DC

## 2012-11-30 MED ORDER — METFORMIN HCL ER 500 MG PO TB24: 1000.0000 mg | ORAL_TABLET | Freq: Two times a day (BID) | ORAL | Status: DC

## 2012-11-30 NOTE — Progress Notes (Signed)
Patient ID: Debbie [name], female   DOB: 01-Mar-1961, 52 y.o.   MRN: [id number]  Translator use to communicate with patient HPI: Pt was seen here a couple of weeks ago and presenting for follow up of poorly controlled type 2 diabetes with diabetic foot ulcers on both feet which recently was treated with cipro and doxycycline.  The patient had blood sugars that have been as high as 400.  The patient is recently refugee to this country.    Allergies  Allergen Reactions  . Penicillins Rash   Past Medical History  Diagnosis Date  . Type II or unspecified type diabetes mellitus with unspecified complication, uncontrolled     x ~ 10 years  . Hypertension     x ~ 10 years  . Hypothyroidism   . Chest pain     a. ? MI in 2010, [place]->medically managed  . Diabetic foot ulcer   . Obesity    Current Outpatient Prescriptions on File Prior to Visit  Medication Sig Dispense Refill  . aspirin 81 MG chewable tablet Chew 1 tablet (81 mg total) by mouth daily.  30 tablet  0  . atenolol (TENORMIN) 50 MG tablet Take 1 tablet (50 mg total) by mouth daily.  30 tablet  0  . ciprofloxacin (CIPRO) 500 MG tablet Take 1 tablet (500 mg total) by mouth 2 (two) times daily.  10 tablet  0  . doxycycline (VIBRAMYCIN) 100 MG capsule Take 1 capsule (100 mg total) by mouth 2 (two) times daily.  20 capsule  0  . insulin NPH-regular (NOVOLIN 70/30) (70-30) 100 UNIT/ML injection Inject 25-30 Units into the skin 2 (two) times daily. Take 30 units every morning and 25 units at dinner. Refill x 1.  10 mL  12  . lisinopril (PRINIVIL,ZESTRIL) 5 MG tablet Take 1 tablet (5 mg total) by mouth daily.  30 tablet  0  . metFORMIN (GLUCOPHAGE) 1000 MG tablet Take 1 tablet (1,000 mg total) by mouth 2 (two) times daily with a meal.  30 tablet  0  . sertraline (ZOLOFT) 25 MG tablet Take 1 tablet (25 mg total) by mouth daily.  30 tablet  0   No current facility-administered medications on file prior to visit.   Family History  Problem  Relation Age of Onset  . Diabetes Father     died in his 2's?  . Diabetes Father     died in her 30's?  . Diabetes Sister     alive and well.   History   Social History  . Marital Status: Single    Spouse Name: N/A    Number of Children: N/A  . Years of Education: N/A   Occupational History  . Not on file.   Social History Main Topics  . Smoking status: Never Smoker   . Smokeless tobacco: Not on file  . Alcohol Use: No  . Drug Use: No  . Sexually Active: Not on file   Other Topics Concern  . Not on file   Social History Narrative   From [place], [place].  Moved to [place] to live with sister in April of 2014.  She does not routinely exercise.    Review of Systems  Constitutional: Negative for fever, chills, diaphoresis, activity change, appetite change and fatigue.  HENT: Negative for ear pain, nosebleeds, congestion, facial swelling, rhinorrhea, neck pain, neck stiffness and ear discharge.   Eyes: Negative for pain, discharge, redness, itching and visual disturbance.  Respiratory: Negative for cough, choking, chest  tightness, shortness of breath, wheezing and stridor.   Cardiovascular: Negative for chest pain, palpitations and leg swelling.  Gastrointestinal: Negative for abdominal distention.  Genitourinary: Negative for dysuria, urgency, frequency, hematuria, flank pain, decreased urine volume, difficulty urinating and dyspareunia.  Musculoskeletal: Negative for back pain, joint swelling, arthralgias and gait problem.  Neurological: Negative for dizziness, tremors, seizures, syncope, facial asymmetry, speech difficulty, weakness, light-headedness, numbness and headaches.  Hematological: Negative for adenopathy. Does not bruise/bleed easily.  Psychiatric/Behavioral: Negative for hallucinations, behavioral problems, confusion, dysphoric mood, decreased concentration and agitation.    Objective:   Filed Vitals:   11/30/12 1115  BP: 124/68  Pulse: 88  Temp: 98 F (36.7  C)  Resp: 20    Physical Exam  Constitutional: Appears well-developed and well-nourished. No distress.  HENT: Normocephalic. External right and left ear normal. Oropharynx is clear and moist.  Eyes: Conjunctivae and EOM are normal. PERRLA, no scleral icterus.  Neck: Normal ROM. Neck supple. No JVD. No tracheal deviation. No thyromegaly.  CVS: RRR, S1/S2 +, no murmurs, no gallops, no carotid bruit.  Pulmonary: Effort and breath sounds normal, no stridor, rhonchi, wheezes, rales.  Abdominal: Soft. BS +,  no distension, tenderness, rebound or guarding.  Musculoskeletal: Patient has a healing ulcer on the right fourth toe and  Also on the left fifth toe no signs of active infection.   tender to palpation noted.    Pain with palpation of lower back.  Lymphadenopathy: No lymphadenopathy noted, cervical, inguinal. Neuro: Alert. Normal reflexes, muscle tone coordination. No cranial nerve deficit. Skin: Skin is warm and dry. No rash noted. Not diaphoretic. No erythema. No pallor.  Psychiatric: Normal mood and affect. Behavior, judgment, thought content normal.   Lab Results  Component Value Date   WBC 8.9 11/13/2012   HGB 13.3 11/13/2012   HCT 39.2 11/13/2012   MCV 79.0 11/13/2012   PLT 261 11/13/2012   Lab Results  Component Value Date   CREATININE 0.82 11/13/2012   BUN 18 11/13/2012   NA 139 11/13/2012   K 4.8 11/13/2012   CL 102 11/13/2012   CO2 28 11/13/2012    Lab Results  Component Value Date   HGBA1C 11.9* 11/09/2012   Lipid Panel  No results found for this basename: chol, trig, hdl, cholhdl, vldl, ldlcalc       Assessment and plan:   Patient Active Problem List   Diagnosis Date Noted  . Diabetic foot infection 11/30/2012  . Unspecified hypothyroidism 11/30/2012  . Adjustment disorder with mixed anxiety and depressed mood 11/10/2012  . Diabetes mellitus type 2 with peripheral artery disease 11/09/2012  . Type II or unspecified type diabetes mellitus with unspecified complication,  uncontrolled 11/09/2012  . HTN (hypertension) 11/09/2012  . Chest pain 11/09/2012  . Tachypnea 11/09/2012        Chronic low back pain  Diabetic foot infection involving the right 4th toe and left 5th toe, both slowly improving.  Increase doses of insulin to 40 units 70/30 with breakfast and 35 units with supper.   Pt will need to follow up next week to recheck blood sugar and recheck her toes to be sure they are continuing to heal.  Check BS at least 4 times per day and bring readings to next office visit Gave pt 14 units of novolog in office today Fall precautions Rx for walker for stability with ambulation  The patient was given clear instructions to go to ER or return to medical center if symptoms don't improve, worsen  or new problems develop.  The patient verbalized understanding.  The patient was told to call to get lab results if they haven't heard anything in the next week.    Follow up 4 days for recheck blood sugar and recheck wounds on toes   Rodney Langton, MD, CDE, FAAFP [facility] [place], [place]

## 2012-11-30 NOTE — Patient Instructions (Addendum)
Diabetes and Foot Care Diabetes may cause you to have a poor blood supply (circulation) to your legs and feet. Because of this, the skin may be thinner, break easier, and heal more slowly. You also may have nerve damage in your legs and feet causing decreased feeling. You may not notice minor injuries to your feet that could lead to serious problems or infections. Taking care of your feet is one of the most important things you can do for yourself.  HOME CARE INSTRUCTIONS  Do not go barefoot. Bare feet are easily injured.  Check your feet daily for blisters, cuts, and redness.  Wash your feet with warm water (not hot) and mild soap. Pat your feet and between your toes until completely dry.  Apply a moisturizing lotion that does not contain alcohol or petroleum jelly to the dry skin on your feet and to dry brittle toenails. Do not put it between your toes.  Trim your toenails straight across. Do not dig under them or around the cuticle.  Do not cut corns or calluses, or try to remove them with medicine.  Wear clean cotton socks or stockings every day. Make sure they are not too tight. Do not wear knee high stockings since they may decrease blood flow to your legs.  Wear leather shoes that fit properly and have enough cushioning. To break in new shoes, wear them just a few hours a day to avoid injuring your feet.  Wear shoes at all times, even in the house.  Do not cross your legs. This may decrease the blood flow to your feet.  If you find a minor scrape, cut, or break in the skin on your feet, keep it and the skin around it clean and dry. These areas may be cleansed with mild soap and water. Do not use peroxide, alcohol, iodine or Merthiolate.  When you remove an adhesive bandage, be sure not to harm the skin around it.  If you have a wound, look at it several times a day to make sure it is healing.  Do not use heating pads or hot water bottles. Burns can occur. If you have lost feeling  in your feet or legs, you may not know it is happening until it is too late.  Report any cuts, sores or bruises to your caregiver. Do not wait! SEEK MEDICAL CARE IF:   You have an injury that is not healing or you notice redness, numbness, burning, or tingling.  Your feet always feel cold.  You have pain or cramps in your legs and feet. SEEK IMMEDIATE MEDICAL CARE IF:   There is increasing redness, swelling, or increasing pain in the wound.  There is a red line that goes up your leg.  Pus is coming from a wound.  You develop an unexplained oral temperature above 102 F (38.9 C), or as your caregiver suggests.  You notice a bad smell coming from an ulcer or wound. MAKE SURE YOU:   Understand these instructions.  Will watch your condition.  Will get help right away if you are not doing well or get worse. Document Released: 06/24/2000 Document Revised: 09/19/2011 Document Reviewed: 12/31/2008 [facility] Patient Information 2014 [place], [place]. A1c, Hemoglobin A1c The A1c (hemoglobin A1c, glycosylated hemoglobin) test checks the average amount of sugar (glucose) in the blood over the last 2 to 3 months. It does this by measuring the concentration of glycosylated hemoglobin. As glucose circulates in the blood, some of it binds to hemoglobin A. This  is the main form of hemoglobin in adults. Hemoglobin is a red protein that carries oxygen in the red blood cells (RBCs). Once the glucose is bound to the hemoglobin A, it remains there for the life of the red blood cell (about 120 days). This combination of glucose and hemoglobin A is called A1c. Increased glucose in the blood, increases the hemoglobin A1c. A1c levels do not change quickly but will shift as RBCs are replaced. A1c is a valuable test because it enables you to know how your glucose has been controlled over the past 3 months.  5% A1c  Estimated Average Glucose mg/dL: 97  6% W0J  Estimated Average Glucose mg/dL: 811  7%  B1Y  Estimated Average Glucose mg/dL: 782  8% N5A  Estimated Average Glucose mg/dL: 213  9% Y8M  Estimated Average Glucose mg/dL: 578  46% N6E  Estimated Average Glucose mg/dL: 952  84% X3K  Estimated Average Glucose mg/dL: 440  10% U7O  Estimated Average Glucose mg/dL: 536 The American Diabetes Association (ADA) recommends testing your A1c level 4 times each year if you have type 1 or type 2 diabetes and use insulin; or 2 times each year if you have type 2 diabetes and do not use insulin. When someone is first diagnosed with diabetes or if control is not good, A1c may be ordered more frequently. PREPARATION FOR TEST No preparation or fasting is necessary for this blood sample. NORMAL FINDINGS   Adults without diabetes: 2.2 to 4.8%  Children without diabetes: 1.8 to 4.0%  Good diabetic control: 2.5 to 5.9%  Fair diabetic control: 6 to 8%  Poor diabetic control: greater than 8% The values of A1c may be falsely low in pregnancy, in disorders with shortened red blood cell lives, or in sickle cell disease or trait (carrier). The values may be falsely high in disorders with longer red cell lives, or in people with Thallassemia, kidney failure, or iron deficiency anemia. Ranges for normal findings may vary among different laboratories and hospitals. You should always check with your doctor after having lab work or other tests done to discuss the meaning of your test results and whether your values are considered within normal limits. MEANING OF TEST  Your caregiver will go over the test results with you and discuss the importance and meaning of your results, as well as treatment options and the need for additional tests if necessary. If your A1c is greater than 7%, discuss treatment options with your caregiver. OBTAINING THE TEST RESULTS  It is your responsibility to obtain your test results. Ask the lab or department performing the test when and how you will get your  results. Document Released: 07/19/2004 Document Revised: 09/19/2011 Document Reviewed: 05/24/2012 [employer] Patient Information 2014 [place], [place]. 1800 Calorie Diet for Diabetes Meal Planning The 1800 calorie diet is designed for eating up to 1800 calories each day. Following this diet and making healthy meal choices can help improve overall health. This diet controls blood sugar (glucose) levels and can also help lower blood pressure and cholesterol. SERVING SIZES Measuring foods and serving sizes helps to make sure you are getting the right amount of food. The list below tells how big or small some common serving sizes are:  1 oz.........4 stacked dice.  3 oz........Mar[place] KitchenDeck of cards.  1 tsp.......Marland KitchenTip of little finger.  1 tbs......Marland KitchenMarland KitchenThumb.  2 tbs.......Marland KitchenGolf ball.   cup......Marland KitchenHalf of a fist.  1 cup.......Marland KitchenA fist. GUIDELINES FOR CHOOSING FOODS The goal of this diet is to eat a  variety of foods and limit calories to 1800 each day. This can be done by choosing foods that are low in calories and fat. The diet also suggests eating small amounts of food frequently. Doing this helps control your blood glucose levels so they do not get too high or too low. Each meal or snack may include a protein food source to help you feel more satisfied and to stabilize your blood glucose. Try to eat about the same amount of food around the same time each day. This includes weekend days, travel days, and days off work. Space your meals about 4 to 5 hours apart and add a snack between them if you wish.  For example, a daily food plan could include breakfast, a morning snack, lunch, dinner, and an evening snack. Healthy meals and snacks include whole grains, vegetables, fruits, lean meats, poultry, fish, and dairy products. As you plan your meals, select a variety of foods. Choose from the bread and starch, vegetable, fruit, dairy, and meat/protein groups. Examples of foods from each group and their suggested  serving sizes are listed below. Use measuring cups and spoons to become familiar with what a healthy portion looks like. Bread and Starch Each serving equals 15 grams of carbohydrates.  1 slice bread.   bagel.   cup cold cereal (unsweetened).   cup hot cereal or mashed potatoes.  1 small potato (size of a computer mouse).   cup cooked pasta or rice.   English muffin.  1 cup broth-based soup.  3 cups of popcorn.  4 to 6 whole-wheat crackers.   cup cooked beans, peas, or corn. Vegetable Each serving equals 5 grams of carbohydrates.   cup cooked vegetables.  1 cup raw vegetables.   cup tomato or vegetable juice. Fruit Each serving equals 15 grams of carbohydrates.  1 small apple or orange.  1 cup watermelon or strawberries.   cup applesauce (no sugar added).  2 tbs raisins.   banana.   cup canned fruit, packed in water, its own juice, or sweetened with a sugar substitute.   cup unsweetened fruit juice. Dairy Each serving equals 12 to 15 grams of carbohydrates.  1 cup fat-free milk.  6 oz artificially sweetened yogurt or plain yogurt.  1 cup low-fat buttermilk.  1 cup soy milk.  1 cup almond milk. Meat/Protein  1 large egg.  2 to 3 oz meat, poultry, or fish.   cup low-fat cottage cheese.  1 tbs peanut butter.  1 oz low-fat cheese.   cup tuna in water.   cup tofu. Fat  1 tsp oil.  1 tsp trans-fat-free margarine.  1 tsp butter.  1 tsp mayonnaise.  2 tbs avocado.  1 tbs salad dressing.  1 tbs cream cheese.  2 tbs sour cream. SAMPLE 1800 CALORIE DIET PLAN Breakfast   cup unsweetened cereal (1 carb serving).  1 cup fat-free milk (1 carb serving).  1 slice whole-wheat toast (1 carb serving).   small banana (1 carb serving).  1 scrambled egg.  1 tsp trans-fat-free margarine. Lunch  Tuna sandwich.  2 slices whole-wheat bread (2 carb servings).   cup canned tuna in water, drained.  1 tbs reduced  fat mayonnaise.  1 stalk celery, chopped.  2 slices tomato.  1 lettuce leaf.  1 cup carrot sticks.  24 to 30 seedless grapes (2 carb servings).  6 oz light yogurt (1 carb serving). Afternoon Snack  3 graham cracker squares (1 carb serving).  Fat-free milk, 1 cup (  1 carb serving).  1 tbs peanut butter. Dinner  3 oz salmon, broiled with 1 tsp oil.  1 cup mashed potatoes (2 carb servings) with 1 tsp trans-fat-free margarine.  1 cup fresh or frozen green beans.  1 cup steamed asparagus.  1 cup fat-free milk (1 carb serving). Evening Snack  3 cups air-popped popcorn (1 carb serving).  2 tbs parmesan cheese sprinkled on top. MEAL PLAN Use this worksheet to help you make a daily meal plan based on the 1800 calorie diet suggestions. If you are using this plan to help you control your blood glucose, you may interchange carbohydrate-containing foods (dairy, starches, and fruits). Select a variety of fresh foods of varying colors and flavors. The total amount of carbohydrate in your meals or snacks is more important than making sure you include all of the food groups every time you eat. Choose from the following foods to build your day's meals:  8 Starches.  4 Vegetables.  3 Fruits.  2 Dairy.  6 to 7 oz Meat/Protein.  Up to 4 Fats. Your dietician can use this worksheet to help you decide how many servings and which types of foods are right for you. BREAKFAST Food Group and Servings / Food Choice Starch ________________________________________________________ Dairy _________________________________________________________ Fruit _________________________________________________________ Meat/Protein __________________________________________________ Fat ___________________________________________________________ LUNCH Food Group and Servings / Food Choice Starch ________________________________________________________ Meat/Protein  __________________________________________________ Vegetable _____________________________________________________ Fruit _________________________________________________________ Dairy _________________________________________________________ Fat ___________________________________________________________ Aura Fey Food Group and Servings / Food Choice Starch ________________________________________________________ Meat/Protein __________________________________________________ Fruit __________________________________________________________ Dairy _________________________________________________________ Laural Golden Food Group and Servings / Food Choice Starch _________________________________________________________ Meat/Protein ___________________________________________________ Dairy __________________________________________________________ Vegetable ______________________________________________________ Fruit ___________________________________________________________ Fat ____________________________________________________________ Lollie Sails Food Group and Servings / Food Choice Fruit __________________________________________________________ Meat/Protein ___________________________________________________ Dairy __________________________________________________________ Starch _________________________________________________________ DAILY TOTALS Starch ____________________________ Vegetable _________________________ Fruit _____________________________ Dairy _____________________________ Meat/Protein______________________ Fat _______________________________ Document Released: 01/17/2005 Document Revised: 09/19/2011 Document Reviewed: 05/13/2011 [employer] Patient Information 2014 [place]. Blood Sugar Monitoring, Adult GLUCOSE METERS FOR SELF-MONITORING OF BLOOD GLUCOSE  It is important to be able to correctly measure your blood sugar (glucose). You can use a blood  glucose monitor (a small battery-operated device) to check your glucose level at any time. This allows you and your caregiver to monitor your diabetes and to determine how well your treatment plan is working. The process of monitoring your blood glucose with a glucose meter is called self-monitoring of blood glucose (SMBG). When people with diabetes control their blood sugar, they have better health. To test for glucose with a typical glucose meter, place the disposable strip in the meter. Then place a small sample of blood on the "test strip." The test strip is coated with chemicals that combine with glucose in blood. The meter measures how much glucose is present. The meter displays the glucose level as a number. Several new models can record and store a number of test results. Some models can connect to personal computers to store test results or print them out.  Newer meters are often easier to use than older models. Some meters allow you to get blood from places other than your fingertip. Some new models have automatic timing, error codes, signals, or barcode readers to help with proper adjustment (calibration). Some meters have a large display screen or spoken instructions for people with visual impairments.  INSTRUCTIONS FOR USING GLUCOSE METERS  Wash your hands with soap and warm water, or clean the area with alcohol. Dry your hands completely.  Prick the side of your fingertip with a lancet (a sharp-pointed tool  used by hand).  Hold the hand down and gently milk the finger until a small drop of blood appears. Catch the blood with the test strip.  Follow the instructions for inserting the test strip and using the SMBG meter. Most meters require the meter to be turned on and the test strip to be inserted before applying the blood sample.  Record the test result.  Read the instructions carefully for both the meter and the test strips that go with it. Meter instructions are found in the user  manual. Keep this manual to help you solve any problems that may arise. Many meters use "error codes" when there is a problem with the meter, the test strip, or the blood sample on the strip. You will need the manual to understand these error codes and fix the problem.  New devices are available such as laser lancets and meters that can test blood taken from "alternative sites" of the body, other than fingertips. However, you should use standard fingertip testing if your glucose changes rapidly. Also, use standard testing if:  You have eaten, exercised, or taken insulin in the past 2 hours.  You think your glucose is low.  You tend to not feel symptoms of low blood glucose (hypoglycemia).  You are ill or under stress.  Clean the meter as directed by the manufacturer.  Test the meter for accuracy as directed by the manufacturer.  Take your meter with you to your caregiver's office. This way, you can test your glucose in front of your caregiver to make sure you are using the meter correctly. Your caregiver can also take a sample of blood to test using a routine lab method. If values on the glucose meter are close to the lab results, you and your caregiver will see that your meter is working well and you are using good technique. Your caregiver will advise you about what to do if the results do not match. FREQUENCY OF TESTING  Your caregiver will tell you how often you should check your blood glucose. This will depend on your type of diabetes, your current level of diabetes control, and your types of medicines. The following are general guidelines, but your care plan may be different. Record all your readings and the time of day you took them for review with your caregiver.   Diabetes type 1.  When you are using insulin with good diabetic control (either multiple daily injections or via a pump), you should check your glucose 4 times a day.  If your diabetes is not well controlled, you may need  to monitor more frequently, including before meals and 2 hours after meals, at bedtime, and occasionally between 2 a.m. and 3 a.m.  You should always check your glucose before a dose of insulin or before changing the rate on your insulin pump.  Diabetes type 2.  Guidelines for SMBG in diabetes type 2 are not as well defined.  If you are on insulin, follow the guidelines above.  If you are on medicines, but not insulin, and your glucose is not well controlled, you should test at least twice daily.  If you are not on insulin, and your diabetes is controlled with medicines or diet alone, you should test at least once daily, usually before breakfast.  A weekly profile will help your caregiver advise you on your care plan. The week before your visit, check your glucose before a meal and 2 hours after a meal at least daily. You may  want to test before and after a different meal each day so you and your caregiver can tell how well controlled your blood sugars are throughout the course of a 24 hour period.  Gestational diabetes (diabetes during pregnancy).  Frequent testing is often necessary. Accurate timing is important.  If you are not on insulin, check your glucose 4 times a day. Check it before breakfast and 1 hour after the start of each meal.  If you are on insulin, check your glucose 6 times a day. Check it before each meal and 1 hour after the first bite of each meal.  General guidelines.  More frequent testing is required at the start of insulin treatment. Your caregiver will instruct you.  Test your glucose any time you suspect you have low blood sugar (hypoglycemia).  You should test more often when you change medicines, when you have unusual stress or illness, or in other unusual circumstances. OTHER THINGS TO KNOW ABOUT GLUCOSE METERS  Measurement Range. Most glucose meters are able to read glucose levels over a broad range of values from as low as 0 to as high as 600 mg/dL. If  you get an extremely high or low reading from your meter, you should first confirm it with another reading. Report very high or very low readings to your caregiver.  Whole Blood Glucose versus Plasma Glucose. Some older home glucose meters measure glucose in your whole blood. In a lab or when using some newer home glucose meters, the glucose is measured in your plasma (one component of blood). The difference can be important. It is important for you and your caregiver to know whether your meter gives its results as "whole blood equivalent" or "plasma equivalent."  Display of High and Low Glucose Values. Part of learning how to operate a meter is understanding what the meter results mean. Know how high and low glucose concentrations are displayed on your meter.  Factors that Affect Glucose Meter Performance. The accuracy of your test results depends on many factors and varies depending on the brand and type of meter. These factors include:  Low red blood cell count (anemia).  Substances in your blood (such as uric acid, vitamin C, and others).  Environmental factors (temperature, humidity, altitude).  Name-brand versus generic test strips.  Calibration. Make sure your meter is set up properly. It is a good idea to do a calibration test with a control solution recommended by the manufacturer of your meter whenever you begin using a fresh bottle of test strips. This will help verify the accuracy of your meter.  Improperly stored, expired, or defective test strips. Keep your strips in a dry place with the lid on.  Soiled meter.  Inadequate blood sample. NEW TECHNOLOGIES FOR GLUCOSE TESTING Alternative site testing Some glucose meters allow testing blood from alternative sites. These include the:  Upper arm.  Forearm.  Base of the thumb.  Thigh. Sampling blood from alternative sites may be desirable. However, it may have some limitations. Blood in the fingertips show changes in glucose  levels more quickly than blood in other parts of the body. This means that alternative site test results may be different from fingertip test results, not because of the meter's ability to test accurately, but because the actual glucose concentration can be different.  Continuous Glucose Monitoring Devices to measure your blood glucose continuously are available, and others are in development. These methods can be more expensive than self-monitoring with a glucose meter. However, it is uncertain  how effective and reliable these devices are. Your caregiver will advise you if this approach makes sense for you. IF BLOOD SUGARS ARE CONTROLLED, PEOPLE WITH DIABETES REMAIN HEALTHIER.  SMBG is an important part of the treatment plan of patients with diabetes mellitus. Below are reasons for using SMBG:   It confirms that your glucose is at a specific, healthy level.  It detects hypoglycemia and severe hyperglycemia.  It allows you and your caregiver to make adjustments in response to changes in lifestyle for individuals requiring medicine.  It determines the need for starting insulin therapy in temporary diabetes that happens during pregnancy (gestational diabetes). Document Released: 06/30/2003 Document Revised: 09/19/2011 Document Reviewed: 10/21/2010 [facility] Patient Information 2014 [place], [place]. Fall Prevention and Home Safety Falls cause injuries and can affect all age groups. It is possible to use preventive measures to significantly decrease the likelihood of falls. There are many simple measures which can make your home safer and prevent falls. OUTDOORS  Repair cracks and edges of walkways and driveways.  Remove high doorway thresholds.  Trim shrubbery on the main path into your home.  Have good outside lighting.  Clear walkways of tools, rocks, debris, and clutter.  Check that handrails are not broken and are securely fastened. Both sides of steps should have handrails.  Have  leaves, snow, and ice cleared regularly.  Use sand or salt on walkways during winter months.  In the garage, clean up grease or oil spills. BATHROOM  Install night lights.  Install grab bars by the toilet and in the tub and shower.  Use non-skid mats or decals in the tub or shower.  Place a plastic non-slip stool in the shower to sit on, if needed.  Keep floors dry and clean up all water on the floor immediately.  Remove soap buildup in the tub or shower on a regular basis.  Secure bath mats with non-slip, double-sided rug tape.  Remove throw rugs and tripping hazards from the floors. BEDROOMS  Install night lights.  Make sure a bedside light is easy to reach.  Do not use oversized bedding.  Keep a telephone by your bedside.  Have a firm chair with side arms to use for getting dressed.  Remove throw rugs and tripping hazards from the floor. KITCHEN  Keep handles on pots and pans turned toward the center of the stove. Use back burners when possible.  Clean up spills quickly and allow time for drying.  Avoid walking on wet floors.  Avoid hot utensils and knives.  Position shelves so they are not too high or low.  Place commonly used objects within easy reach.  If necessary, use a sturdy step stool with a grab bar when reaching.  Keep electrical cables out of the way.  Do not use floor polish or wax that makes floors slippery. If you must use wax, use non-skid floor wax.  Remove throw rugs and tripping hazards from the floor. STAIRWAYS  Never leave objects on stairs.  Place handrails on both sides of stairways and use them. Fix any loose handrails. Make sure handrails on both sides of the stairways are as long as the stairs.  Check carpeting to make sure it is firmly attached along stairs. Make repairs to worn or loose carpet promptly.  Avoid placing throw rugs at the top or bottom of stairways, or properly secure the rug with carpet tape to prevent  slippage. Get rid of throw rugs, if possible.  Have an electrician put in a light  switch at the top and bottom of the stairs. OTHER FALL PREVENTION TIPS  Wear low-heel or rubber-soled shoes that are supportive and fit well. Wear closed toe shoes.  When using a stepladder, make sure it is fully opened and both spreaders are firmly locked. Do not climb a closed stepladder.  Add color or contrast paint or tape to grab bars and handrails in your home. Place contrasting color strips on first and last steps.  Learn and use mobility aids as needed. Install an electrical emergency response system.  Turn on lights to avoid dark areas. Replace light bulbs that burn out immediately. Get light switches that glow.  Arrange furniture to create clear pathways. Keep furniture in the same place.  Firmly attach carpet with non-skid or double-sided tape.  Eliminate uneven floor surfaces.  Select a carpet pattern that does not visually hide the edge of steps.  Be aware of all pets. OTHER HOME SAFETY TIPS  Set the water temperature for 120 F (48.8 C).  Keep emergency numbers on or near the telephone.  Keep smoke detectors on every level of the home and near sleeping areas. Document Released: 06/17/2002 Document Revised: 12/27/2011 Document Reviewed: 09/16/2011 [facility] Patient Information 2014 [place], [place].

## 2012-11-30 NOTE — Progress Notes (Signed)
Patient's sister presents with patient and states that patient has been shaking and trembling at home due to pain from back pain. Case worker states that patient is from [place] and may have some Post traumatic Stress. Patients requests information about diabetic diet.

## 2012-11-30 NOTE — Progress Notes (Signed)
Insulin dosage verified by Arvin Collard, LPN. Patient response to insulin. No reaction. BS 275.

## 2012-12-05 ENCOUNTER — Ambulatory Visit: Payer: Medicaid Other | Attending: Internal Medicine | Admitting: Internal Medicine

## 2012-12-05 ENCOUNTER — Encounter: Payer: Self-pay | Admitting: Internal Medicine

## 2012-12-05 VITALS — BP 146/83 | HR 105 | Temp 98.6°F | Resp 18 | Wt 217.0 lb

## 2012-12-05 DIAGNOSIS — M545 Low back pain, unspecified: Secondary | ICD-10-CM

## 2012-12-05 DIAGNOSIS — E118 Type 2 diabetes mellitus with unspecified complications: Secondary | ICD-10-CM

## 2012-12-05 DIAGNOSIS — IMO0002 Reserved for concepts with insufficient information to code with codable children: Secondary | ICD-10-CM

## 2012-12-05 DIAGNOSIS — E1151 Type 2 diabetes mellitus with diabetic peripheral angiopathy without gangrene: Secondary | ICD-10-CM

## 2012-12-05 DIAGNOSIS — I10 Essential (primary) hypertension: Secondary | ICD-10-CM

## 2012-12-05 DIAGNOSIS — E039 Hypothyroidism, unspecified: Secondary | ICD-10-CM

## 2012-12-05 DIAGNOSIS — E1165 Type 2 diabetes mellitus with hyperglycemia: Secondary | ICD-10-CM

## 2012-12-05 DIAGNOSIS — E11621 Type 2 diabetes mellitus with foot ulcer: Secondary | ICD-10-CM

## 2012-12-05 DIAGNOSIS — E1159 Type 2 diabetes mellitus with other circulatory complications: Secondary | ICD-10-CM

## 2012-12-05 DIAGNOSIS — L089 Local infection of the skin and subcutaneous tissue, unspecified: Secondary | ICD-10-CM

## 2012-12-05 DIAGNOSIS — L97509 Non-pressure chronic ulcer of other part of unspecified foot with unspecified severity: Secondary | ICD-10-CM | POA: Insufficient documentation

## 2012-12-05 DIAGNOSIS — I739 Peripheral vascular disease, unspecified: Secondary | ICD-10-CM

## 2012-12-05 DIAGNOSIS — E1169 Type 2 diabetes mellitus with other specified complication: Secondary | ICD-10-CM | POA: Insufficient documentation

## 2012-12-05 DIAGNOSIS — G8929 Other chronic pain: Secondary | ICD-10-CM

## 2012-12-05 DIAGNOSIS — E11628 Type 2 diabetes mellitus with other skin complications: Secondary | ICD-10-CM

## 2012-12-05 MED ORDER — NEOMYCIN-POLYMYXIN-PRAMOXINE 1 % EX CREA: TOPICAL_CREAM | Freq: Two times a day (BID) | CUTANEOUS | Status: DC

## 2012-12-05 MED ORDER — CIPROFLOXACIN HCL 500 MG PO TABS: 500.0000 mg | ORAL_TABLET | Freq: Two times a day (BID) | ORAL | Status: DC

## 2012-12-05 MED ORDER — DOXYCYCLINE HYCLATE 100 MG PO TABS: 100.0000 mg | ORAL_TABLET | Freq: Two times a day (BID) | ORAL | Status: DC

## 2012-12-05 MED ORDER — TRAMADOL HCL 50 MG PO TABS: 50.0000 mg | ORAL_TABLET | Freq: Three times a day (TID) | ORAL | Status: DC | PRN

## 2012-12-05 NOTE — Progress Notes (Signed)
Patient ID: Debbie [name], female   DOB: [birth year], 52 y.o.   MRN: [id number] Patient Demographics  Debbie [name], is a 52 y.o. female  NWG:[id number]  [id number]  DOB - 1960/08/04  Chief Complaint  Patient presents with  . Follow-up        Subjective:   Debbie [name] today is here for a follow up visit. The patient has recently immigrated to the [place] from [place], history obtained with the help of the interpreter. Patient states that she has also refills but has not been checking her blood sugars as she does not know how to use the glucometer. She also has diabetic foot ulcers, worse in his right fourth toe which is painful, black eschar with erythema, tender, and left 5th toe. She also complains of low back pain had recent MRI of the lumbar spine, which showed prominent disc disease at L5-S1. Paient was told that she will need surgery and she is currently not mentally prepared for it..    Objective:    Filed Vitals:   12/05/12 1314  BP: 146/83  Pulse: 105  Temp: 98.6 F (37 C)  Resp: 18  Weight: 217 lb (98.431 kg)  SpO2: 98%     ALLERGIES:   Allergies  Allergen Reactions  . Penicillins Rash    PAST MEDICAL HISTORY: Past Medical History  Diagnosis Date  . Type II or unspecified type diabetes mellitus with unspecified complication, uncontrolled     x ~ 10 years  . Hypertension     x ~ 10 years  . Hypothyroidism   . Chest pain     a. ? MI in 2010, [place]->medically managed  . Diabetic foot ulcer   . Obesity     MEDICATIONS AT HOME: Prior to Admission medications   Medication Sig Start Date End Date Taking? Authorizing Provider  aspirin 81 MG chewable tablet Chew 1 tablet (81 mg total) by mouth daily. 11/13/12   Debbie Llano, MD  atenolol (TENORMIN) 50 MG tablet Take 1 tablet (50 mg total) by mouth daily. 11/13/12   Debbie Llano, MD  ciprofloxacin (CIPRO) 500 MG tablet Take 1 tablet (500 mg total) by mouth 2 (two) times daily. X 2 weeks 12/05/12   Debbie [name]  Debbie Luo, MD  doxycycline (VIBRA-TABS) 100 MG tablet Take 1 tablet (100 mg total) by mouth 2 (two) times daily. X 2 weeks 12/05/12   Debbie [name] Debbie Luo, MD  insulin NPH-regular (NOVOLIN 70/30) (70-30) 100 UNIT/ML injection Take 40 units every morning and 35 units at dinner. Refill x 1. 11/30/12   Debbie Cyndie Mull, MD  lisinopril (PRINIVIL,ZESTRIL) 5 MG tablet Take 1 tablet (5 mg total) by mouth daily. 11/13/12   Debbie Llano, MD  metFORMIN (GLUCOPHAGE XR) 500 MG 24 hr tablet Take 2 tablets (1,000 mg total) by mouth 2 (two) times daily with a meal. 11/30/12   Debbie Cyndie Mull, MD  neomycin-polymyxin-pramoxine (NEOSPORIN PLUS) 1 % cream Apply topically 2 (two) times daily. 12/05/12   Debbie [name] Debbie Luo, MD  sertraline (ZOLOFT) 25 MG tablet Take 1 tablet (25 mg total) by mouth daily. 11/13/12   Debbie Llano, MD  traMADol (ULTRAM) 50 MG tablet Take 1 tablet (50 mg total) by mouth every 8 (eight) hours as needed for pain. 12/05/12   Debbie [name] Debbie Luo, MD     Exam  General appearance :Awake, alert, NAD, Speech Clear.  HEENT: Atraumatic and Normocephalic, PERLA Neck: supple, no JVD. No cervical lymphadenopathy.  Chest: Clear to auscultation bilaterally, no wheezing, rales  or rhonchi CVS: S1 S2 regular, no murmurs.  Abdomen: soft, NBS, NT, ND, no gaurding, rigidity or rebound. Extremities: no cyanosis or clubbing, B/L Lower Ext shows no edema Skin: Right 4th toe black eschar with erythema, tender, left 5th toe ulcer, dry, non tender  Neurology: Awake alert, and oriented X 3, CN II-XII intact, Non focal    Data Review   Basic Metabolic Panel: No results found for this basename: NA, K, CL, CO2, GLUCOSE, BUN, CREATININE, CALCIUM, MG, PHOS,  in the last 168 hours Liver Function Tests: No results found for this basename: AST, ALT, ALKPHOS, BILITOT, PROT, ALBUMIN,  in the last 168 hours  CBC: No results found for this basename: WBC, NEUTROABS, HGB, HCT, MCV, PLT,  in the last 168  hours  ------------------------------------------------------------------------------------------------------------------ No results found for this basename: HGBA1C,  in the last 72 hours ------------------------------------------------------------------------------------------------------------------ No results found for this basename: CHOL, HDL, LDLCALC, TRIG, CHOLHDL, LDLDIRECT,  in the last 72 hours ------------------------------------------------------------------------------------------------------------------ No results found for this basename: TSH, T4TOTAL, FREET3, T3FREE, THYROIDAB,  in the last 72 hours ------------------------------------------------------------------------------------------------------------------ No results found for this basename: VITAMINB12, FOLATE, FERRITIN, TIBC, IRON, RETICCTPCT,  in the last 72 hours  Coagulation profile  No results found for this basename: INR, PROTIME,  in the last 168 hours    Assessment & Plan   Active Problems: 1. diabetes mellitus: Insulin-dependent - The patient has been taking insulin NPH 40 units in a.m. 35 units at bedtime however not checking her blood sugars as she does not know how to use the glucometer - Will send ambulatory referral to diabetic education. Patient recommended to continue with metformin and insulin  2. diabetic foot ulcers: - Placed on doxycycline and ciprofloxacin for 2 weeks, Neosporin ointment - Patient counseled extensively on diabetic foot care - X-ray of the right foot to rule out any osteomyelitis on right fourth toe - Ambulate referral to orthopedics  3. chronic low back pain - Patient was explained the results of the MRI that she is aware of. She was told that she needs surgery but she's currently not currently prepared for it.  - Gave prescription for tramadol and explained the "red flag signs of lower extremity weakness, numbness and tingling, urinary or bladder incontinence" to the patient  which will require urgent surgery.  Recommendations: x-ray of the right foot, ambulatory referral to orthopedics, diabetic education  Follow-up in 2 months.     Mekaela Azizi M.D. 12/05/2012, 2:19 PM

## 2012-12-05 NOTE — Patient Instructions (Signed)

## 2012-12-05 NOTE — Progress Notes (Signed)
Patient here for follow up History of DM  

## 2012-12-12 ENCOUNTER — Ambulatory Visit: Payer: Medicaid Other | Attending: Family Medicine | Admitting: Family Medicine

## 2012-12-12 ENCOUNTER — Ambulatory Visit (HOSPITAL_COMMUNITY)
Admission: RE | Admit: 2012-12-12 | Discharge: 2012-12-12 | Disposition: A | Discharge status: Home / Self Care | Payer: Medicaid Other | Source: Ambulatory Visit | Attending: Internal Medicine | Admitting: Internal Medicine

## 2012-12-12 VITALS — BP 150/90 | HR 95 | Temp 98.4°F | Resp 20 | Wt 216.8 lb

## 2012-12-12 DIAGNOSIS — L97519 Non-pressure chronic ulcer of other part of right foot with unspecified severity: Secondary | ICD-10-CM

## 2012-12-12 DIAGNOSIS — E1339 Other specified diabetes mellitus with other diabetic ophthalmic complication: Secondary | ICD-10-CM

## 2012-12-12 DIAGNOSIS — E083499 Diabetes mellitus due to underlying condition with severe nonproliferative diabetic retinopathy without macular edema, unspecified eye: Secondary | ICD-10-CM

## 2012-12-12 DIAGNOSIS — M7730 Calcaneal spur, unspecified foot: Secondary | ICD-10-CM | POA: Insufficient documentation

## 2012-12-12 DIAGNOSIS — E119 Type 2 diabetes mellitus without complications: Secondary | ICD-10-CM | POA: Insufficient documentation

## 2012-12-12 DIAGNOSIS — I10 Essential (primary) hypertension: Secondary | ICD-10-CM

## 2012-12-12 DIAGNOSIS — F411 Generalized anxiety disorder: Secondary | ICD-10-CM

## 2012-12-12 DIAGNOSIS — M79609 Pain in unspecified limb: Secondary | ICD-10-CM | POA: Insufficient documentation

## 2012-12-12 DIAGNOSIS — E039 Hypothyroidism, unspecified: Secondary | ICD-10-CM

## 2012-12-12 DIAGNOSIS — G8929 Other chronic pain: Secondary | ICD-10-CM

## 2012-12-12 DIAGNOSIS — E1151 Type 2 diabetes mellitus with diabetic peripheral angiopathy without gangrene: Secondary | ICD-10-CM

## 2012-12-12 DIAGNOSIS — IMO0002 Reserved for concepts with insufficient information to code with codable children: Secondary | ICD-10-CM

## 2012-12-12 DIAGNOSIS — E1159 Type 2 diabetes mellitus with other circulatory complications: Secondary | ICD-10-CM

## 2012-12-12 DIAGNOSIS — E1149 Type 2 diabetes mellitus with other diabetic neurological complication: Secondary | ICD-10-CM | POA: Insufficient documentation

## 2012-12-12 DIAGNOSIS — E1142 Type 2 diabetes mellitus with diabetic polyneuropathy: Secondary | ICD-10-CM | POA: Insufficient documentation

## 2012-12-12 DIAGNOSIS — E11621 Type 2 diabetes mellitus with foot ulcer: Secondary | ICD-10-CM

## 2012-12-12 DIAGNOSIS — L989 Disorder of the skin and subcutaneous tissue, unspecified: Secondary | ICD-10-CM

## 2012-12-12 DIAGNOSIS — F4310 Post-traumatic stress disorder, unspecified: Secondary | ICD-10-CM

## 2012-12-12 DIAGNOSIS — E118 Type 2 diabetes mellitus with unspecified complications: Secondary | ICD-10-CM

## 2012-12-12 DIAGNOSIS — L97509 Non-pressure chronic ulcer of other part of unspecified foot with unspecified severity: Secondary | ICD-10-CM

## 2012-12-12 DIAGNOSIS — M545 Low back pain, unspecified: Secondary | ICD-10-CM

## 2012-12-12 DIAGNOSIS — E11319 Type 2 diabetes mellitus with unspecified diabetic retinopathy without macular edema: Secondary | ICD-10-CM

## 2012-12-12 DIAGNOSIS — E1165 Type 2 diabetes mellitus with hyperglycemia: Secondary | ICD-10-CM

## 2012-12-12 DIAGNOSIS — M7989 Other specified soft tissue disorders: Secondary | ICD-10-CM | POA: Insufficient documentation

## 2012-12-12 DIAGNOSIS — I739 Peripheral vascular disease, unspecified: Secondary | ICD-10-CM

## 2012-12-12 LAB — GLUCOSE, POCT (MANUAL RESULT ENTRY): POC Glucose: 210 mg/dl — AB (ref 70–99)

## 2012-12-12 NOTE — Progress Notes (Signed)
Patient here for follow up History of DM Wound check to feet

## 2012-12-12 NOTE — Progress Notes (Signed)
Patient ID: Debbie [name], female   DOB: 05-19-1961, 52 y.o.   MRN: [id number]  CC: follow up  Translator used to communicate with patient  HPI: The patient is presenting today to follow up.  Her foot ulcer is getting much better.  She was not able to get the xray done after last visit because of her not being able to have transportation.  In addition, she is reporting that her blood sugars are in the 200-300 range on most occasions.  They are testing 3 times per day.  No low blood sugars reported.  Pt is ambulating much better.   Pt has been having loose stools on the metformin but is still taking it at this time.   Allergies  Allergen Reactions  . Penicillins Rash   Past Medical History  Diagnosis Date  . Type II or unspecified type diabetes mellitus with unspecified complication, uncontrolled     x ~ 10 years  . Hypertension     x ~ 10 years  . Hypothyroidism   . Chest pain     a. ? MI in 2010, [place]->medically managed  . Diabetic foot ulcer   . Obesity    Current Outpatient Prescriptions on File Prior to Visit  Medication Sig Dispense Refill  . aspirin 81 MG chewable tablet Chew 1 tablet (81 mg total) by mouth daily.  30 tablet  0  . atenolol (TENORMIN) 50 MG tablet Take 1 tablet (50 mg total) by mouth daily.  30 tablet  0  . ciprofloxacin (CIPRO) 500 MG tablet Take 1 tablet (500 mg total) by mouth 2 (two) times daily. X 2 weeks  30 tablet  0  . doxycycline (VIBRA-TABS) 100 MG tablet Take 1 tablet (100 mg total) by mouth 2 (two) times daily. X 2 weeks  30 tablet  0  . insulin NPH-regular (NOVOLIN 70/30) (70-30) 100 UNIT/ML injection Take 40 units every morning and 35 units at dinner. Refill x 1.  10 mL  12  . lisinopril (PRINIVIL,ZESTRIL) 5 MG tablet Take 1 tablet (5 mg total) by mouth daily.  30 tablet  0  . metFORMIN (GLUCOPHAGE XR) 500 MG 24 hr tablet Take 2 tablets (1,000 mg total) by mouth 2 (two) times daily with a meal.  120 tablet  3  . neomycin-polymyxin-pramoxine  (NEOSPORIN PLUS) 1 % cream Apply topically 2 (two) times daily.  14.2 g  0  . sertraline (ZOLOFT) 25 MG tablet Take 1 tablet (25 mg total) by mouth daily.  30 tablet  0  . traMADol (ULTRAM) 50 MG tablet Take 1 tablet (50 mg total) by mouth every 8 (eight) hours as needed for pain.  60 tablet  1   No current facility-administered medications on file prior to visit.   Family History  Problem Relation Age of Onset  . Diabetes Father     died in his 68's?  . Diabetes Father     died in her 28's?  . Diabetes Sister     alive and well.   History   Social History  . Marital Status: Single    Spouse Name: N/A    Number of Children: N/A  . Years of Education: N/A   Occupational History  . Not on file.   Social History Main Topics  . Smoking status: Never Smoker   . Smokeless tobacco: Not on file  . Alcohol Use: No  . Drug Use: No  . Sexually Active: Not on file   Other Topics Concern  .  Not on file   Social History Narrative   From [place], [place].  Moved to [place] to live with sister in April of 2014.  She does not routinely exercise.    Review of Systems  Constitutional: Negative for fever, chills, diaphoresis, activity change, appetite change and fatigue.  HENT: Negative for ear pain, nosebleeds, congestion, facial swelling, rhinorrhea, neck pain, neck stiffness and ear discharge.   Eyes: Negative for pain, discharge, redness, itching and visual disturbance.  Respiratory: Negative for cough, choking, chest tightness, shortness of breath, wheezing and stridor.   Cardiovascular: Negative for chest pain, palpitations and leg swelling.  Gastrointestinal: Negative for abdominal distention.  Genitourinary: Negative for dysuria, urgency, frequency, hematuria, flank pain, decreased urine volume, difficulty urinating and dyspareunia.  Musculoskeletal: Negative for back pain, joint swelling, arthralgias and gait problem.  Neurological: Negative for dizziness, tremors, seizures, syncope,  facial asymmetry, speech difficulty, weakness, light-headedness, numbness and headaches.  Hematological: Negative for adenopathy. Does not bruise/bleed easily.  Psychiatric/Behavioral: Negative for hallucinations, behavioral problems, confusion, dysphoric mood, decreased concentration and agitation.    Objective:   Filed Vitals:   12/12/12 1742  BP: 150/90  Pulse: 95  Temp: 98.4 F (36.9 C)  Resp: 20    Physical Exam  Constitutional: Appears well-developed and well-nourished. No distress.  HENT: Normocephalic. External right and left ear normal. Oropharynx is clear and moist.  Eyes: Conjunctivae and EOM are normal. PERRLA, no scleral icterus.  Neck: Normal ROM. Neck supple. No JVD. No tracheal deviation. No thyromegaly.  CVS: RRR, S1/S2 +, no murmurs, no gallops, no carotid bruit.  Pulmonary: Effort and breath sounds normal, no stridor, rhonchi, wheezes, rales.  Abdominal: Soft. BS +,  no distension, tenderness, rebound or guarding.  Musculoskeletal: eschar right fourth toe which is painful, black eschar with erythema, tender, and left 5th toe  Lymphadenopathy: No lymphadenopathy noted, cervical, inguinal. Neuro: Alert. Normal reflexes, muscle tone coordination. No cranial nerve deficit. Skin: Skin is warm and dry. No rash noted. Not diaphoretic. No erythema. No pallor.  Psychiatric: Flat affect, anxious  Lab Results  Component Value Date   WBC 8.9 11/13/2012   HGB 13.3 11/13/2012   HCT 39.2 11/13/2012   MCV 79.0 11/13/2012   PLT 261 11/13/2012   Lab Results  Component Value Date   CREATININE 0.82 11/13/2012   BUN 18 11/13/2012   NA 139 11/13/2012   K 4.8 11/13/2012   CL 102 11/13/2012   CO2 28 11/13/2012    Lab Results  Component Value Date   HGBA1C 11.9* 11/09/2012   Lipid Panel  No results found for this basename: chol, trig, hdl, cholhdl, vldl, ldlcalc       Assessment and plan:   Patient Active Problem List   Diagnosis Date Noted  . Diabetic retinopathy 12/12/2012  .  Diabetic foot ulcers 12/05/2012  . Diabetic foot infection 11/30/2012  . Unspecified hypothyroidism 11/30/2012  . Gait instability 11/30/2012  . Chronic low back pain 11/30/2012  . Adjustment disorder with mixed anxiety and depressed mood 11/10/2012  . Diabetes mellitus type 2 with peripheral artery disease 11/09/2012  . Type II or unspecified type diabetes mellitus with unspecified complication, uncontrolled 11/09/2012  . HTN (hypertension) 11/09/2012  . Tachypnea 11/09/2012   I am concerned that patient is suffering from PTSD and concurrent anxiety / depression and I am making a referral to behavioral health for patient to be evaluated by a psychiatrist.   THe patient was wearing flip flops today.  I explained to patient that  patient is at high risk for further foot lesions because of her diabetic neuropathy and she verbalized understanding.   Her diabetes continues to be poorly controlled.  Increasing the doses of 70/30 to 50 units with breakfast and 45 units with supper.  Continue monitoring BS 4 times per day.   Hypoglycemia precautions discussed with patient and family and they verbalized understanding.   Her blood pressure is elevated but she is also having significant anxiety at this time.   The patient was sent to radiology to have an xray of her foot done to rule out osteomyelitis.   The patient was already referred to orthopedics and to diabetes education.    She will need ongoing close outpatient follow up.   RTC in 2 weeks  The patient was given clear instructions to go to ER or return to medical center if symptoms don't improve, worsen or new problems develop.  The patient verbalized understanding.  The patient was told to call to get lab results if they haven't heard anything in the next week.     Rodney Langton, MD, CDE, FAAFP [facility], [place]

## 2012-12-13 ENCOUNTER — Encounter: Payer: Self-pay | Admitting: Family Medicine

## 2012-12-13 DIAGNOSIS — E1142 Type 2 diabetes mellitus with diabetic polyneuropathy: Secondary | ICD-10-CM | POA: Insufficient documentation

## 2012-12-13 DIAGNOSIS — F411 Generalized anxiety disorder: Secondary | ICD-10-CM | POA: Insufficient documentation

## 2012-12-13 NOTE — Patient Instructions (Signed)
Go to Radiology to have your xray done of the foot.

## 2012-12-18 ENCOUNTER — Telehealth: Payer: Self-pay

## 2012-12-18 NOTE — Telephone Encounter (Signed)
Patient not available. Left message to return call.

## 2012-12-19 ENCOUNTER — Ambulatory Visit: Payer: Medicaid Other | Admitting: Family Medicine

## 2012-12-29 ENCOUNTER — Emergency Department (HOSPITAL_COMMUNITY): Payer: Medicaid Other

## 2012-12-29 ENCOUNTER — Encounter (HOSPITAL_COMMUNITY): Payer: Self-pay | Admitting: Adult Health

## 2012-12-29 ENCOUNTER — Observation Stay (HOSPITAL_COMMUNITY)
Admission: EM | Admit: 2012-12-29 | Discharge: 2013-01-02 | Disposition: A | Discharge status: Home / Self Care | Payer: Medicaid Other | Attending: Family Medicine | Admitting: Family Medicine

## 2012-12-29 DIAGNOSIS — R279 Unspecified lack of coordination: Secondary | ICD-10-CM | POA: Insufficient documentation

## 2012-12-29 DIAGNOSIS — E11319 Type 2 diabetes mellitus with unspecified diabetic retinopathy without macular edema: Secondary | ICD-10-CM | POA: Diagnosis present

## 2012-12-29 DIAGNOSIS — R55 Syncope and collapse: Secondary | ICD-10-CM

## 2012-12-29 DIAGNOSIS — Z794 Long term (current) use of insulin: Secondary | ICD-10-CM | POA: Insufficient documentation

## 2012-12-29 DIAGNOSIS — R4182 Altered mental status, unspecified: Secondary | ICD-10-CM | POA: Diagnosis present

## 2012-12-29 DIAGNOSIS — R5381 Other malaise: Secondary | ICD-10-CM | POA: Insufficient documentation

## 2012-12-29 DIAGNOSIS — E1151 Type 2 diabetes mellitus with diabetic peripheral angiopathy without gangrene: Secondary | ICD-10-CM

## 2012-12-29 DIAGNOSIS — E119 Type 2 diabetes mellitus without complications: Secondary | ICD-10-CM | POA: Insufficient documentation

## 2012-12-29 DIAGNOSIS — F411 Generalized anxiety disorder: Secondary | ICD-10-CM | POA: Diagnosis present

## 2012-12-29 DIAGNOSIS — E1159 Type 2 diabetes mellitus with other circulatory complications: Secondary | ICD-10-CM

## 2012-12-29 DIAGNOSIS — R42 Dizziness and giddiness: Principal | ICD-10-CM | POA: Insufficient documentation

## 2012-12-29 DIAGNOSIS — Z9181 History of falling: Secondary | ICD-10-CM | POA: Insufficient documentation

## 2012-12-29 DIAGNOSIS — E669 Obesity, unspecified: Secondary | ICD-10-CM | POA: Insufficient documentation

## 2012-12-29 DIAGNOSIS — F4323 Adjustment disorder with mixed anxiety and depressed mood: Secondary | ICD-10-CM

## 2012-12-29 DIAGNOSIS — E11621 Type 2 diabetes mellitus with foot ulcer: Secondary | ICD-10-CM

## 2012-12-29 DIAGNOSIS — R05 Cough: Secondary | ICD-10-CM | POA: Insufficient documentation

## 2012-12-29 DIAGNOSIS — R1313 Dysphagia, pharyngeal phase: Secondary | ICD-10-CM | POA: Insufficient documentation

## 2012-12-29 DIAGNOSIS — I739 Peripheral vascular disease, unspecified: Secondary | ICD-10-CM

## 2012-12-29 DIAGNOSIS — H539 Unspecified visual disturbance: Secondary | ICD-10-CM | POA: Insufficient documentation

## 2012-12-29 DIAGNOSIS — R262 Difficulty in walking, not elsewhere classified: Secondary | ICD-10-CM | POA: Insufficient documentation

## 2012-12-29 DIAGNOSIS — R059 Cough, unspecified: Secondary | ICD-10-CM | POA: Insufficient documentation

## 2012-12-29 DIAGNOSIS — E039 Hypothyroidism, unspecified: Secondary | ICD-10-CM | POA: Insufficient documentation

## 2012-12-29 DIAGNOSIS — I10 Essential (primary) hypertension: Secondary | ICD-10-CM | POA: Insufficient documentation

## 2012-12-29 LAB — DIFFERENTIAL
Basophils Relative: 0 % (ref 0–1)
Eosinophils Absolute: 0.1 10*3/uL (ref 0.0–0.7)
Eosinophils Relative: 1 % (ref 0–5)
Lymphs Abs: 5 10*3/uL — ABNORMAL HIGH (ref 0.7–4.0)
Monocytes Relative: 5 % (ref 3–12)
Neutrophils Relative %: 49 % (ref 43–77)

## 2012-12-29 LAB — GLUCOSE, CAPILLARY: Glucose-Capillary: 418 mg/dL — ABNORMAL HIGH (ref 70–99)

## 2012-12-29 LAB — PROTIME-INR
INR: 0.96 (ref 0.00–1.49)
Prothrombin Time: 12.7 seconds (ref 11.6–15.2)

## 2012-12-29 LAB — CBC
MCH: 27.6 pg (ref 26.0–34.0)
MCHC: 34.5 g/dL (ref 30.0–36.0)
MCV: 79.9 fL (ref 78.0–100.0)
Platelets: 240 10*3/uL (ref 150–400)
RBC: 5.48 MIL/uL — ABNORMAL HIGH (ref 3.87–5.11)

## 2012-12-29 LAB — POCT I-STAT TROPONIN I: Troponin i, poc: 0 ng/mL (ref 0.00–0.08)

## 2012-12-29 MED ORDER — INSULIN ASPART 100 UNIT/ML ~~LOC~~ SOLN
5.0000 [IU] | Freq: Once | SUBCUTANEOUS | Status: AC
  Administered 2012-12-30: 5 [IU] via SUBCUTANEOUS
  Filled 2012-12-29: qty 1

## 2012-12-29 MED ORDER — SODIUM CHLORIDE 0.9 % IV BOLUS (SEPSIS)
1000.0000 mL | Freq: Once | INTRAVENOUS | Status: AC
  Administered 2012-12-30: 1000 mL via INTRAVENOUS

## 2012-12-29 MED ORDER — SODIUM CHLORIDE 0.9 % IV BOLUS (SEPSIS)
1000.0000 mL | Freq: Once | INTRAVENOUS | Status: AC
  Administered 2012-12-29: 1000 mL via INTRAVENOUS

## 2012-12-29 NOTE — ED Notes (Addendum)
Presents with syncope that occurred at 2130 this evening. Family states she was out for 10 minutes. Pt reports she feels dizzy. Pt states she began to feel dizzy at 2100. dizziness came on suddenly.  Pt is weaker on the left side, but reports that she feels weak all over. No facial droop, no arm drift, no confusion.  Pt describes the dizziness as the room is spinning.

## 2012-12-29 NOTE — ED Notes (Addendum)
CBG 418 in triage 

## 2012-12-29 NOTE — ED Notes (Signed)
EKG completed by tech in triage.

## 2012-12-29 NOTE — ED Provider Notes (Signed)
History     CSN: [id number]  Arrival date & time 12/29/12  2204   First MD Initiated Contact with Patient 12/29/12 2258      Chief Complaint  Patient presents with  . Code Stroke    (Consider location/radiation/quality/duration/timing/severity/associated sxs/prior treatment) HPI Comments: [place] immigrant with a history of insulin-dependent diabetes mellitus, hypertension, hypothyroidism and obesity who was brought to the emergency room after an episode of loss of consciousness following which she complained of dizziness and it reduced grip strength on the left side. Patient reportedly did not take insulin earlier today and was in a hot environment at a social gathering. Witness states that patient was found to be unresponsive for about 10 minutes. She appeared confused when she woke up. She had some weakness on her left side in code stroke was called on arrival to the ED. Patient denies any chest pain, shortness of breath, nausea or vomiting. She remembers feeling dizzy but not much else.  The history is provided by the patient and a relative. The history is limited by the condition of the patient and a language barrier.    Past Medical History  Diagnosis Date  . Type II or unspecified type diabetes mellitus with unspecified complication, uncontrolled     x ~ 10 years  . Hypertension     x ~ 10 years  . Hypothyroidism   . Chest pain     a. ? MI in 2010, [place]->medically managed  . Diabetic foot ulcer   . Obesity     Past Surgical History  Procedure Laterality Date  . Back surgery      Family History  Problem Relation Age of Onset  . Diabetes Father     died in his 50's?  . Diabetes Father     died in her 45's?  . Diabetes Sister     alive and well.    History  Substance Use Topics  . Smoking status: Never Smoker   . Smokeless tobacco: Not on file  . Alcohol Use: No    OB History   Grav Para Term Preterm Abortions TAB SAB Ect Mult Living                   Review of Systems  Constitutional: Negative for fever, activity change and appetite change.  HENT: Negative for congestion and rhinorrhea.   Respiratory: Negative for cough, chest tightness and shortness of breath.   Cardiovascular: Negative for chest pain.  Gastrointestinal: Negative for nausea, vomiting and abdominal pain.  Genitourinary: Negative for dysuria, hematuria, vaginal bleeding and vaginal discharge.  Musculoskeletal: Negative for back pain.  Skin: Negative for rash.  Neurological: Positive for dizziness, weakness, light-headedness and numbness. Negative for headaches.  A complete 10 system review of systems was obtained and all systems are negative except as noted in the HPI and PMH.    Allergies  Penicillins  Home Medications   No current outpatient prescriptions on file.  BP 129/68  Pulse 87  Temp(Src) 97.4 F (36.3 C) (Oral)  Resp 20  Ht 5\' 4"  (1.626 m)  Wt 224 lb 9.6 oz (101.878 kg)  BMI 38.53 kg/m2  SpO2 97%  Physical Exam  Constitutional: She is oriented to person, place, and time. She appears well-developed and well-nourished. No distress.  HENT:  Head: Normocephalic and atraumatic.  Mouth/Throat: Oropharynx is clear and moist. No oropharyngeal exudate.  Eyes: Conjunctivae and EOM are normal. Pupils are equal, round, and reactive to light.  Neck: Normal range  of motion. Neck supple.  Cardiovascular: Normal rate, regular rhythm and normal heart sounds.   Pulmonary/Chest: Effort normal and breath sounds normal. No respiratory distress.  Abdominal: Soft. There is no tenderness. There is no rebound and no guarding.  Musculoskeletal: Normal range of motion. She exhibits no edema and no tenderness.  Neurological: She is alert and oriented to person, place, and time. No cranial nerve deficit. She exhibits normal muscle tone. Coordination normal.  CN 2-12 intact, no ataxia on finger to nose, no nystagmus, 5/5 strength throughout, slight pronator drift on  the right. Romberg negative, normal gait.   Skin: Skin is warm.    ED Course  Procedures (including critical care time)  Labs Reviewed  GLUCOSE, CAPILLARY - Abnormal; Notable for the following:    Glucose-Capillary 418 (*)    All other components within normal limits  CBC - Abnormal; Notable for the following:    WBC 11.1 (*)    RBC 5.48 (*)    Hemoglobin 15.1 (*)    All other components within normal limits  DIFFERENTIAL - Abnormal; Notable for the following:    Lymphs Abs 5.0 (*)    All other components within normal limits  COMPREHENSIVE METABOLIC PANEL - Abnormal; Notable for the following:    Sodium 131 (*)    Glucose, Bld 432 (*)    ALT 39 (*)    All other components within normal limits  D-DIMER, QUANTITATIVE - Abnormal; Notable for the following:    D-Dimer, Quant 1.37 (*)    All other components within normal limits  GLUCOSE, CAPILLARY - Abnormal; Notable for the following:    Glucose-Capillary 216 (*)    All other components within normal limits  PROTIME-INR  APTT  TROPONIN I  TSH  POCT I-STAT TROPONIN I   Ct Head (brain) Wo Contrast  12/29/2012   *RADIOLOGY REPORT*  Clinical Data: Syncope, out for 10 minutes, preceded by dizziness; now weak on left side  CT HEAD WITHOUT CONTRAST  Technique:  Contiguous axial images were obtained from the base of the skull through the vertex without contrast.  Comparison: 10/31/2012  Findings: Generalized atrophy. Normal ventricular morphology. No midline shift or mass effect. Minimal small vessel chronic ischemic changes of deep cerebral white matter. No intracranial hemorrhage, mass lesion or evidence of acute infarction. No extra-axial fluid collections. Bones and sinuses unremarkable.  IMPRESSION: Atrophy with minimal small vessel chronic ischemic changes of deep cerebral white matter. No acute intracranial abnormalities.  Findings called to Dr. Karma Ganja on 12/29/2012 at 2250 hours.   Original Report Authenticated By: Ulyses Southward,  M.D.   Ct Angio Chest Pe W/cm &/or Wo Cm  12/30/2012   *RADIOLOGY REPORT*  Clinical Data: Syncope, dizziness, chest pain, history diabetes, hypertension  CT ANGIOGRAPHY CHEST  Technique:  Multidetector CT imaging of the chest using the standard protocol during bolus administration of intravenous contrast. Multiplanar reconstructed images including MIPs were obtained and reviewed to evaluate the vascular anatomy.  Contrast: OMNIPAQUE IOHEXOL 350 MG/ML SOLN  Comparison: 10/31/2012  Findings: Asymmetric enlargement of right thyroid lobe containing calcification and nodule. Aorta normal caliber without aneurysm or dissection. Scattered respiratory motion artifacts. Beam-hardening secondary to body habitus. No evidence of pulmonary embolism identified within limitations as above. No thoracic adenopathy. Visualized portions of liver and spleen unremarkable. Lungs grossly clear. No pleural effusion or pneumothorax. No acute osseous findings.  IMPRESSION: Limitations of exam secondary to beam hardening and respiratory motion. No evidence of pulmonary embolism identified. Enlarged right thyroid lobe  containing nodule and calcification. Consider further evaluation with thyroid ultrasound.  If patient is clinically hyperthyroid, consider nuclear medicine thyroid uptake and scan.   Original Report Authenticated By: Ulyses Southward, M.D.     1. Syncope   2. Dizziness   3. Altered mental status   4. Diabetes mellitus type 2 with peripheral artery disease   5. HTN (hypertension)   6. Adjustment disorder with mixed anxiety and depressed mood   7. Syncope and collapse       MDM  Episode of syncope preceded by dizziness and lightheadedness. Does not recall episode and now with questionable left-sided deficits. Code Stroke on arrival.  CT head negative. Nonfocal neuro exam. She's been seen by Dr. Roseanne Reno and the stroke team. He does not feel that she had a TIA or CVA. She has no neuro deficits apparent this  time. It is possible she had syncope or seizure. She continues to endorse dizzy spells. Orthostatics are negative. EKG is nonischemic. Troponin is negative. There is no tongue biting urinary incontinence. No postictal period.  Will need further testing per neurology including MRI, EEG unlikely echocardiogram.         Date: 12/29/2012  Rate: 95  Rhythm: normal sinus rhythm  QRS Axis: normal  Intervals: normal  ST/T Wave abnormalities: nonspecific ST/T changes  Conduction Disutrbances:none  Narrative Interpretation:   Old EKG Reviewed: unchanged    Glynn Octave, MD 12/30/12 [phone]

## 2012-12-29 NOTE — Code Documentation (Signed)
52 yo [place] female brought in via pvt vehicle after a syncopal episode.  Pt with c/o dizziness and generalized weakness. CBG 419.  No focal deficits. Code stroke called 2240, pt arruval 2235, LKW 2100, EDP exam 2239, stroke team arrival 2241, neurologist arrival 2246, pt arrival in CT 2244, phlebotomist arrival 2251, code stroke cancelled 2259.  NIH 2.

## 2012-12-29 NOTE — Consult Note (Signed)
NEURO HOSPITALIST CONSULT NOTE    Reason for Consult: Dizziness and complaint of left-sided weakness following an episode of unconsciousness.  HPI:                                                                                                                                          Debbie [name] is an 52 y.o. female [place] immigrant with a history of insulin-dependent diabetes mellitus, hypertension, hypothyroidism and obesity who was brought to the emergency room after an episode of loss of consciousness following which she complained of dizziness and it reduced grip strength on the left side. Patient reportedly did not take insulin earlier today and was in a hot environment at a social gathering. She reportedly lost consciousness and on awakening complained of dizziness and was noted to have reduced grip strength of the left hand. Blood sugar was 418. No tonic or clonic activity was noted during the period of unconsciousness. Code stroke was activated after arriving in the emergency room. She was last seen well at 9 PM tonight. CT scan of her head showed no acute cranial abnormality. NIH stroke score was 2 (mild confusion and equivocal drift of right upper extremity versus poor effort). Code stroke was subsequently canceled as patient had no clear objective indications of an acute focal central nervous system deficit.  Past Medical History  Diagnosis Date  . Type II or unspecified type diabetes mellitus with unspecified complication, uncontrolled     x ~ 10 years  . Hypertension     x ~ 10 years  . Hypothyroidism   . Chest pain     a. ? MI in 2010, [place]->medically managed  . Diabetic foot ulcer   . Obesity     Past Surgical History  Procedure Laterality Date  . Back surgery      Family History  Problem Relation Age of Onset  . Diabetes Father     died in his 71's?  . Diabetes Father     died in her 49's?  . Diabetes Sister     alive and well.      Social History:  reports that she has never smoked. She does not have any smokeless tobacco history on file. She reports that she does not drink alcohol or use illicit drugs.  Allergies  Allergen Reactions  . Penicillins Rash    MEDICATIONS:  I have reviewed the patient's current medications. Prior to Admission:  Aspirin 81 mg per day Tenormin 50 mg per day Cipro 500 mg twice a day Doxycycline 100 mg twice a day Novolin 70/3040 units in the morning and 30 units in the evening Lisinopril 5 mg per day Metformin 500 mg 2 tablets twice a day Neosporin ointment applied as directed twice a day Zoloft 25 mg per day Ultram 50 mg every 8 hours when necessary for pain  ROS:                                                                                                                                       History obtained from sibling  General ROS: negative for - chills, fatigue, fever, night sweats, weight gain or weight loss Psychological ROS: negative for - behavioral disorder, hallucinations, memory difficulties, mood swings or suicidal ideation Ophthalmic ROS: Positive for reduced visual acuity with known cataracts and diabetic retinopathy ENT ROS: negative for - epistaxis, nasal discharge, oral lesions, sore throat, tinnitus or vertigo Allergy and Immunology ROS: negative for - hives or itchy/watery eyes Hematological and Lymphatic ROS: negative for - bleeding problems, bruising or swollen lymph nodes Endocrine ROS:  Full control of diabetes with blood sugars often enough 400s and 500s Respiratory ROS: negative for - cough, hemoptysis, shortness of breath or wheezing Cardiovascular ROS: negative for - chest pain, dyspnea on exertion, edema or irregular heartbeat Gastrointestinal ROS: negative for - abdominal pain, diarrhea, hematemesis, nausea/vomiting or stool  incontinence Genito-Urinary ROS: negative for - dysuria, hematuria, incontinence or urinary frequency/urgency Musculoskeletal ROS: negative for - joint swelling or muscular weakness Neurological ROS: as noted in HPI Dermatological ROS: Diabetic ulcer involving the left fifth toe; on antibiotic   Blood pressure 148/74, pulse 92, temperature 97.6 F (36.4 C), temperature source Oral, resp. rate 17, SpO2 99.00%.   Neurologic Examination:                                                                                                      Mental Status: Alert, interpreter required for communication; oriented to correct age but disoriented to correct month.  Per her sister who was doing the interpreting, patient's speech was normal with no indications of expressive nor receptive aphasia. Cranial Nerves: II-unable to adequately assess visual acuity and visual fields do to reduced vision bilaterally as well as poor corporation. III/IV/VI-Pupils were equal and reacted. Extraocular movements were full and conjugate on right and left lateral movements.  V/VII-no facial weakness. VIII-normal. X-no dysarthria per patient's sister. Motor: Normal strength throughout except for equivocal weakness of right upper extremity with drift against gravity space (versus poor voluntary effort) Sensory: Unable to adequately evaluate. Deep Tendon Reflexes: 1+ and symmetric. Plantars: Mute bilaterally Cerebellar: Normal finger-to-nose testing. Carotid auscultation: Normal  No results found for this basename: cbc, bmp, coags, chol, tri, ldl, hga1c    Results for orders placed during the hospital encounter of 12/29/12 (from the past 48 hour(s))  GLUCOSE, CAPILLARY     Status: Abnormal   Collection Time    12/29/12 10:19 PM      Result Value Range   Glucose-Capillary 418 (*) 70 - 99 mg/dL    Ct Head (brain) Wo Contrast  12/29/2012   *RADIOLOGY REPORT*  Clinical Data: Syncope, out for 10 minutes, preceded by  dizziness; now weak on left side  CT HEAD WITHOUT CONTRAST  Technique:  Contiguous axial images were obtained from the base of the skull through the vertex without contrast.  Comparison: 10/31/2012  Findings: Generalized atrophy. Normal ventricular morphology. No midline shift or mass effect. Minimal small vessel chronic ischemic changes of deep cerebral white matter. No intracranial hemorrhage, mass lesion or evidence of acute infarction. No extra-axial fluid collections. Bones and sinuses unremarkable.  IMPRESSION: Atrophy with minimal small vessel chronic ischemic changes of deep cerebral white matter. No acute intracranial abnormalities.  Findings called to Dr. Karma Ganja on 12/29/2012 at 2250 hours.   Original Report Authenticated By: Ulyses Southward, M.D.    Assessment/Plan: Likely a syncopal spell as etiology for episode of loss of consciousness. Patient has residual complaint of lightheadedness with no true vertigo. No focal deficits were reported by family and none noted on neurological exam. No indication clinically of acute stroke or TIA.  Recommendations: 1. Diabetes management per admitting primary physician 2. Continue aspirin 81 mg per day. 3. MRI of the brain without contrast. 4. No stroke risk workup of this patient's MRI shows indications of acute stroke 5. EEG, routine adult study  Venetia Maxon M.D. [facility] [phone]  12/29/2012, 11:05 PM

## 2012-12-30 ENCOUNTER — Encounter (HOSPITAL_COMMUNITY): Payer: Self-pay | Admitting: Radiology

## 2012-12-30 ENCOUNTER — Observation Stay (HOSPITAL_COMMUNITY): Payer: Medicaid Other

## 2012-12-30 DIAGNOSIS — F4323 Adjustment disorder with mixed anxiety and depressed mood: Secondary | ICD-10-CM

## 2012-12-30 LAB — CBC WITH DIFFERENTIAL/PLATELET
Basophils Absolute: 0 10*3/uL (ref 0.0–0.1)
Basophils Relative: 1 % (ref 0–1)
Eosinophils Absolute: 0.2 10*3/uL (ref 0.0–0.7)
HCT: 40.2 % (ref 36.0–46.0)
Hemoglobin: 13.6 g/dL (ref 12.0–15.0)
MCH: 26.9 pg (ref 26.0–34.0)
MCHC: 33.8 g/dL (ref 30.0–36.0)
Monocytes Absolute: 0.5 10*3/uL (ref 0.1–1.0)
Monocytes Relative: 6 % (ref 3–12)
Neutro Abs: 3.9 10*3/uL (ref 1.7–7.7)
RDW: 13.1 % (ref 11.5–15.5)

## 2012-12-30 LAB — URINALYSIS, ROUTINE W REFLEX MICROSCOPIC
Nitrite: NEGATIVE
Protein, ur: NEGATIVE mg/dL
Specific Gravity, Urine: 1.007 (ref 1.005–1.030)
Urobilinogen, UA: 0.2 mg/dL (ref 0.0–1.0)

## 2012-12-30 LAB — COMPREHENSIVE METABOLIC PANEL
ALT: 34 U/L (ref 0–35)
AST: 20 U/L (ref 0–37)
Albumin: 3.6 g/dL (ref 3.5–5.2)
Alkaline Phosphatase: 110 U/L (ref 39–117)
Alkaline Phosphatase: 89 U/L (ref 39–117)
BUN: 17 mg/dL (ref 6–23)
CO2: 27 mEq/L (ref 19–32)
Calcium: 9.6 mg/dL (ref 8.4–10.5)
GFR calc Af Amer: >90 mL/min (ref >90)
GFR calc Af Amer: >90 mL/min (ref >90)
GFR calc non Af Amer: >90 mL/min (ref >90)
Glucose, Bld: 432 mg/dL — ABNORMAL HIGH (ref 70–99)
Glucose, Bld: 253 mg/dL — ABNORMAL HIGH (ref 70–99)
Potassium: 4.5 mEq/L (ref 3.5–5.1)
Potassium: 3.9 mEq/L (ref 3.5–5.1)
Sodium: 131 mEq/L — ABNORMAL LOW (ref 135–145)
Sodium: 137 mEq/L (ref 135–145)
Total Protein: 7.3 g/dL (ref 6.0–8.3)
Total Protein: 6.1 g/dL (ref 6.0–8.3)

## 2012-12-30 LAB — TROPONIN I: Troponin I: <0.3 ng/mL (ref <0.30)

## 2012-12-30 LAB — URINE MICROSCOPIC-ADD ON

## 2012-12-30 LAB — TSH: TSH: 2.061 u[IU]/mL (ref 0.350–4.500)

## 2012-12-30 LAB — GLUCOSE, CAPILLARY: Glucose-Capillary: 216 mg/dL — ABNORMAL HIGH (ref 70–99)

## 2012-12-30 MED ORDER — INSULIN ASPART 100 UNIT/ML ~~LOC~~ SOLN
0.0000 [IU] | Freq: Every day | SUBCUTANEOUS | Status: DC
  Administered 2012-12-31 – 2013-01-01 (×2): 2 [IU] via SUBCUTANEOUS

## 2012-12-30 MED ORDER — ONDANSETRON HCL 4 MG PO TABS: 4.0000 mg | ORAL_TABLET | Freq: Four times a day (QID) | ORAL | Status: DC | PRN

## 2012-12-30 MED ORDER — LISINOPRIL 5 MG PO TABS
5.0000 mg | ORAL_TABLET | Freq: Every day | ORAL | Status: DC
  Administered 2012-12-30 – 2013-01-02 (×4): 5 mg via ORAL
  Filled 2012-12-30 (×4): qty 1

## 2012-12-30 MED ORDER — SERTRALINE HCL 25 MG PO TABS
25.0000 mg | ORAL_TABLET | Freq: Every day | ORAL | Status: DC
  Administered 2012-12-30 – 2013-01-02 (×4): 25 mg via ORAL
  Filled 2012-12-30 (×4): qty 1

## 2012-12-30 MED ORDER — ACETAMINOPHEN 325 MG PO TABS
650.0000 mg | ORAL_TABLET | Freq: Four times a day (QID) | ORAL | Status: DC | PRN
  Administered 2012-12-30 – 2013-01-02 (×3): 650 mg via ORAL
  Filled 2012-12-30 (×3): qty 2

## 2012-12-30 MED ORDER — INSULIN ASPART 100 UNIT/ML ~~LOC~~ SOLN
0.0000 [IU] | Freq: Three times a day (TID) | SUBCUTANEOUS | Status: DC
  Administered 2012-12-30 (×3): 3 [IU] via SUBCUTANEOUS
  Administered 2013-01-01 (×2): 2 [IU] via SUBCUTANEOUS
  Administered 2013-01-01: 1 [IU] via SUBCUTANEOUS

## 2012-12-30 MED ORDER — SODIUM CHLORIDE 0.9 % IJ SOLN
3.0000 mL | Freq: Two times a day (BID) | INTRAMUSCULAR | Status: DC
  Administered 2012-12-30 – 2013-01-02 (×6): 3 mL via INTRAVENOUS

## 2012-12-30 MED ORDER — ONDANSETRON HCL 4 MG/2ML IJ SOLN: 4.0000 mg | Freq: Four times a day (QID) | INTRAMUSCULAR | Status: DC | PRN

## 2012-12-30 MED ORDER — ASPIRIN 81 MG PO CHEW
81.0000 mg | CHEWABLE_TABLET | Freq: Every day | ORAL | Status: DC
  Administered 2012-12-30 – 2013-01-02 (×4): 81 mg via ORAL
  Filled 2012-12-30 (×4): qty 1

## 2012-12-30 MED ORDER — ATENOLOL 50 MG PO TABS
50.0000 mg | ORAL_TABLET | Freq: Every day | ORAL | Status: DC
  Administered 2012-12-30 – 2013-01-02 (×4): 50 mg via ORAL
  Filled 2012-12-30 (×4): qty 1

## 2012-12-30 MED ORDER — INSULIN ASPART PROT & ASPART (70-30 MIX) 100 UNIT/ML ~~LOC~~ SUSP
35.0000 [IU] | Freq: Two times a day (BID) | SUBCUTANEOUS | Status: DC
  Administered 2012-12-30 – 2012-12-31 (×2): 35 [IU] via SUBCUTANEOUS
  Filled 2012-12-30: qty 10

## 2012-12-30 MED ORDER — GLIMEPIRIDE 4 MG PO TABS
4.0000 mg | ORAL_TABLET | Freq: Two times a day (BID) | ORAL | Status: DC
  Administered 2012-12-30 – 2012-12-31 (×2): 4 mg via ORAL
  Filled 2012-12-30 (×5): qty 1

## 2012-12-30 MED ORDER — IOHEXOL 350 MG/ML SOLN
100.0000 mL | Freq: Once | INTRAVENOUS | Status: AC | PRN
  Administered 2012-12-30: 100 mL via INTRAVENOUS

## 2012-12-30 NOTE — Progress Notes (Signed)
Subjective: Patient lying bed with sunglasses on.  Sister provides interpretation.  She reports that patient is much better today.  Has been under a lot of stress recently with the current events in [place].    Objective: Current vital signs: BP 138/61  Pulse 83  Temp(Src) 97.4 F (36.3 C) (Oral)  Resp 18  Ht 5\' 4"  (1.626 m)  Wt 101.878 kg (224 lb 9.6 oz)  BMI 38.53 kg/m2  SpO2 96% Vital signs in last 24 hours: Temp:  [97.4 F (36.3 C)-97.6 F (36.4 C)] 97.4 F (36.3 C) (06/22 0600) Pulse Rate:  [77-92] 83 (06/22 0805) Resp:  [11-22] 18 (06/22 0805) BP: (125-166)/(57-86) 138/61 mmHg (06/22 0805) SpO2:  [92 %-100 %] 96 % (06/22 0805) Weight:  [101.878 kg (224 lb 9.6 oz)] 101.878 kg (224 lb 9.6 oz) (06/22 0307)  Intake/Output from previous day:   Intake/Output this shift:   Nutritional status: Carb Control  Neurologic Exam: Mental Status:  Alert.  Follows commands through interpreter.   Cranial Nerves:  II-unable to adequately assess visual acuity and visual fields do to reduced vision bilaterally as well as poor corporation.  III/IV/VI-Pupils were equal and reacted. Extraocular movements were full and conjugate on right and left lateral movements.  V/VII-no facial weakness.  VIII-normal.  X-no dysarthria per patient's sister.  Motor: Patient able to lift all extremities against gravity.  No focality noted.    Sensory: Unable to adequately evaluate.  Deep Tendon Reflexes: 1+ and symmetric.  Plantars: Mute bilaterally   Lab Results: Basic Metabolic Panel:  Recent Labs Lab 12/29/12 2301  NA 131*  K 4.5  CL 96  CO2 27  GLUCOSE 432*  BUN 17  CREATININE 0.67  CALCIUM 9.6    Liver Function Tests:  Recent Labs Lab 12/29/12 2301  AST 18  ALT 39*  ALKPHOS 110  BILITOT 0.3  PROT 7.3  ALBUMIN 3.6   No results found for this basename: LIPASE, AMYLASE,  in the last 168 hours No results found for this basename: AMMONIA,  in the last 168 hours  CBC:  Recent  Labs Lab 12/29/12 2301 12/30/12 0900  WBC 11.1* 8.7  NEUTROABS 5.4 3.9  HGB 15.1* 13.6  HCT 43.8 40.2  MCV 79.9 79.6  PLT 240 231    Cardiac Enzymes:  Recent Labs Lab 12/29/12 2301  TROPONINI <0.30    Lipid Panel: No results found for this basename: CHOL, TRIG, HDL, CHOLHDL, VLDL, LDLCALC,  in the last 168 hours  CBG:  Recent Labs Lab 12/29/12 2219 12/30/12 0741  GLUCAP 418* 216*    Microbiology: Results for orders placed during the hospital encounter of 11/09/12  URINE CULTURE     Status: None   Collection Time    11/11/12  2:35 PM      Result Value Range Status   Specimen Description URINE, CLEAN CATCH   Final   Special Requests NONE   Final   Culture  Setup Time 11/11/2012 21:02   Final   Colony Count >=100,000 COLONIES/ML   Final   Culture     Final   Value: Multiple bacterial morphotypes present, none predominant. Suggest appropriate recollection if clinically indicated.   Report Status 11/12/2012 FINAL   Final    Coagulation Studies:  Recent Labs  12/29/12 2301  LABPROT 12.7  INR 0.96    Imaging: Ct Head (brain) Wo Contrast  12/29/2012   *RADIOLOGY REPORT*  Clinical Data: Syncope, out for 10 minutes, preceded by dizziness; now weak on left  side  CT HEAD WITHOUT CONTRAST  Technique:  Contiguous axial images were obtained from the base of the skull through the vertex without contrast.  Comparison: 10/31/2012  Findings: Generalized atrophy. Normal ventricular morphology. No midline shift or mass effect. Minimal small vessel chronic ischemic changes of deep cerebral white matter. No intracranial hemorrhage, mass lesion or evidence of acute infarction. No extra-axial fluid collections. Bones and sinuses unremarkable.  IMPRESSION: Atrophy with minimal small vessel chronic ischemic changes of deep cerebral white matter. No acute intracranial abnormalities.  Findings called to Dr. Karma Ganja on 12/29/2012 at 2250 hours.   Original Report Authenticated By: Ulyses Southward, M.D.   Ct Angio Chest Pe W/cm &/or Wo Cm  12/30/2012   *RADIOLOGY REPORT*  Clinical Data: Syncope, dizziness, chest pain, history diabetes, hypertension  CT ANGIOGRAPHY CHEST  Technique:  Multidetector CT imaging of the chest using the standard protocol during bolus administration of intravenous contrast. Multiplanar reconstructed images including MIPs were obtained and reviewed to evaluate the vascular anatomy.  Contrast: OMNIPAQUE IOHEXOL 350 MG/ML SOLN  Comparison: 10/31/2012  Findings: Asymmetric enlargement of right thyroid lobe containing calcification and nodule. Aorta normal caliber without aneurysm or dissection. Scattered respiratory motion artifacts. Beam-hardening secondary to body habitus. No evidence of pulmonary embolism identified within limitations as above. No thoracic adenopathy. Visualized portions of liver and spleen unremarkable. Lungs grossly clear. No pleural effusion or pneumothorax. No acute osseous findings.  IMPRESSION: Limitations of exam secondary to beam hardening and respiratory motion. No evidence of pulmonary embolism identified. Enlarged right thyroid lobe containing nodule and calcification. Consider further evaluation with thyroid ultrasound.  If patient is clinically hyperthyroid, consider nuclear medicine thyroid uptake and scan.   Original Report Authenticated By: Ulyses Southward, M.D.    Medications:  I have reviewed the patient's current medications. Scheduled: . aspirin  81 mg Oral Daily  . atenolol  50 mg Oral Daily  . glimepiride  4 mg Oral BID AC  . insulin aspart  0-5 Units Subcutaneous QHS  . insulin aspart  0-9 Units Subcutaneous TID WC  . insulin aspart protamine- aspart  35 Units Subcutaneous BID WC  . lisinopril  5 mg Oral Daily  . sertraline  25 mg Oral Daily  . sodium chloride  3 mL Intravenous Q12H    Assessment/Plan: Patient improved today.  Awaiting MRI of the brain but do not suspect acute disease.    Recommendations: 1.  MRI  and EEG pending. 2.  Continue ASA   LOS: 1 day   Thana Farr, MD [facility] [phone] 12/30/2012  9:53 AM

## 2012-12-30 NOTE — Evaluation (Signed)
Clinical/Bedside Swallow Evaluation Patient Details  Name: Debbie [name] MRN: [id number] Date of Birth: December 11, 1960  Today's Date: 12/30/2012 Time: 1200-1230 SLP Time Calculation (min): 30 min  Past Medical History:  Past Medical History  Diagnosis Date  . Type II or unspecified type diabetes mellitus with unspecified complication, uncontrolled     x ~ 10 years  . Hypertension     x ~ 10 years  . Hypothyroidism   . Chest pain     a. ? MI in 2010, [place]->medically managed  . Diabetic foot ulcer   . Obesity    Past Surgical History:  Past Surgical History  Procedure Laterality Date  . Back surgery     HPI:  Debbie [name] is an 52 y.o. female with hx of HTN, DM, hypothyroidism, Obesity, presents to the ER with syncope.  She was at a friend's party, and not yet had any meals, had sudden loss of consicousness for about 10 minutes, with no seizure activity, palpitation, chest pain or SOB.  She had no urinary incontinence.  No post syncope confusion.  She actually said she had five episodes of syncopes over the past month or two.  She has been compliance with her medication and doesn't drink alcohol or using any drugs.  Evaluation in the ED showed EKG with NSR and no acute ST-T changes.  Head CT was negative, and BS was 400's.  She has negative cardiac marker and normal renal fx tests.  A D-Dimer was ordered, was elevated, and a CTPA is pending.  Neurology was originally consulted as a code stroke and she was complaining of left upper extremity weakness.  Hospitalist was asked to admit her for syncope.  BSE ordered per Stroke Protocol.  Patient's sister present during evaluation and provided PMH.  Interpreter used by phone during evaluation.    Assessment / Plan / Recommendation Clinical Impression  Suspected structural pharyngeal dysphagia. Immediate coughing s/p swallow of regular solids with indication of discomfort during the swallow. No other instances of aspiration/penetration with PO's.    With use of interpreter by phone patient reports history of difficulty swallowing solids due to her "enlarged thyroid".  Diagnostic treatment completed following evaluation focusing on providing education to patient and caregivers on diet recommendations and swallow strategies.  Education to patient provided via interpreter on phone.   Recommend to proceed with dysphagia 3 ( mechanical soft) and thin liquids with full supervision with all meals.  ST to follow briefly in acute care setting for diet tolerance and possible advancement.      Aspiration Risk  Mild    Diet Recommendation Dysphagia 3 (Mechanical Soft);Thin liquid   Liquid Administration via: Cup Medication Administration: Whole meds with liquid Supervision: Patient able to self feed;Full supervision/cueing for compensatory strategies Compensations: Slow rate;Follow solids with liquid;Small sips/bites;Multiple dry swallows after each bite/sip;Effortful swallow Postural Changes and/or Swallow Maneuvers: Out of bed for meals;Seated upright 90 degrees;Upright 30-60 min after meal    Other  Recommendations Oral Care Recommendations: Oral care BID   Follow Up Recommendations   (TBD)     Frequency and Duration min 2x/week  2 weeks       SLP Swallow Goals Patient will utilize recommended strategies during swallow to increase swallowing safety with: Modified independent assistance   Swallow Study Prior Functional Status   Per patient history of swallowing difficulty     General Date of Onset: 12/29/12 HPI: Debbie [name] is an 52 y.o. female with hx of HTN, DM, hypothyroidism, Obesity, presents to  the ER with syncope.  She was at a friend's party, and not yet had any meals, had sudden loss of consicousness for about 10 minutes, with no seizure activity, palpitation, chest pain or SOB.  She had no urinary incontinence.  No post syncope confusion.  She actually said she had five episodes of syncopes over the past month or two.  She has  been compliance with her medication and doesn't drink alcohol or using any drugs.  Evaluation in the ED showed EKG with NSR and no acute ST-T changes.  Head CT was negative, and BS was 400's.  She has negative cardiac marker and normal renal fx tests.  A D-Dimer was ordered, was elevated, and a CTPA is pending.  Neurology was originally consulted as a code stroke and she was complaining of left upper extremity weakness.  Hospitalist was asked to admit her for syncope. Diet Prior to this Study: NPO Temperature Spikes Noted: No Respiratory Status: Room air Behavior/Cognition: Alert;Cooperative;Pleasant mood Oral Cavity - Dentition: Adequate natural dentition Self-Feeding Abilities: Needs assist;Able to feed self Patient Positioning: Upright in bed Baseline Vocal Quality: Clear;Low vocal intensity Volitional Cough: Strong Volitional Swallow: Able to elicit    Oral/Motor/Sensory Function Overall Oral Motor/Sensory Function: Appears within functional limits for tasks assessed   Ice Chips Ice chips: Within functional limits   Thin Liquid Presentation: Cup    Nectar Thick Nectar Thick Liquid: Not tested   Honey Thick Honey Thick Liquid: Not tested   Puree Puree: Within functional limits Presentation: Self Fed;Spoon   Solid   GO    Solid: Impaired Other Comments: Incomplete mastication with solids,  Immediate coughing s/p swallow with patient indicating discomfort during swallow       Moreen Fowler MS, CCC-SLP [phone] [facility] 12/30/2012,2:31 PM

## 2012-12-30 NOTE — Progress Notes (Signed)
Seen and assessed patient and agree with Dr. Irwin Brakeman assessment and plan. MRI and EEG pending. Neurology following.

## 2012-12-30 NOTE — ED Notes (Signed)
Pt was very weak when standing. Pt had to be held in order to keep her feet from sliding while she was standing,

## 2012-12-30 NOTE — Progress Notes (Signed)
Pt refuses to stand for orthostatic VS; able to obtain lying and sitting

## 2012-12-30 NOTE — H&P (Signed)
[facility] History and Physical  Debbie [name] ZOX:[id number] DOB: [birth year]    PCP:   None.  Chief Complaint: Syncope.  Translation service utilized.  HPI: Debbie [name] is an 52 y.o. female with hx of HTN, DM, hypothyroidism, Obesity, presents to the ER with syncope.  She was at a friend's party, and not yet had any meals, had sudden loss of consicousness for about 10 minutes, with no seizure activity, palpitation, chest pain or SOB.  She had no urinary incontinence.  No post syncope confusion.  She actually said she had five episodes of syncopes over the past month or two.  She has been compliance with her medication and doesn't drink alcohol or using any drugs.  Evaluation in the ED showed EKG with NSR and no acute ST-T changes.  Head CT was negative, and BS was 400's.  She has negative cardiac marker and normal renal fx tests.  A D-Dimer was ordered, was elevated, and a CTPA is pending.  Neurology was originally consulted as a code stroke and she was complaining of left upper extremity weakness.  Hospitalist was asked to admit her for syncope.  Rewiew of Systems:  Constitutional: Negative for malaise, fever and chills. No significant weight loss or weight gain Eyes: Negative for eye pain, redness and discharge, diplopia, visual changes, or flashes of light. ENMT: Negative for ear pain, hoarseness, nasal congestion, sinus pressure and sore throat. No headaches; tinnitus, drooling, or problem swallowing. Cardiovascular: Negative for chest pain, palpitations, diaphoresis, dyspnea and peripheral edema. ; No orthopnea, PND Respiratory: Negative for cough, hemoptysis, wheezing and stridor. No pleuritic chestpain. Gastrointestinal: Negative for nausea, vomiting, diarrhea, constipation, abdominal pain, melena, blood in stool, hematemesis, jaundice and rectal bleeding.    Genitourinary: Negative for frequency, dysuria, incontinence,flank pain and hematuria; Musculoskeletal: Negative for back  pain and neck pain. Negative for swelling and trauma.;  Skin: . Negative for pruritus, rash, abrasions, bruising and skin lesion.; ulcerations Neuro: Negative for headache, lightheadedness and neck stiffness. Negative for weakness, altered level of consciousness , altered mental status, burning feet, involuntary movement, seizure. Psych: negative for anxiety, depression, insomnia, tearfulness, panic attacks, hallucinations, paranoia, suicidal or homicidal ideation    Past Medical History  Diagnosis Date  . Type II or unspecified type diabetes mellitus with unspecified complication, uncontrolled     x ~ 10 years  . Hypertension     x ~ 10 years  . Hypothyroidism   . Chest pain     a. ? MI in 2010, [place]->medically managed  . Diabetic foot ulcer   . Obesity     Past Surgical History  Procedure Laterality Date  . Back surgery      Medications:  HOME MEDS: Prior to Admission medications   Medication Sig Start Date End Date Taking? Authorizing Provider  aspirin 81 MG chewable tablet Chew 1 tablet (81 mg total) by mouth daily. 11/13/12  Yes Clydia Llano, MD  atenolol (TENORMIN) 50 MG tablet Take 1 tablet (50 mg total) by mouth daily. 11/13/12  Yes Clydia Llano, MD  glimepiride (AMARYL) 4 MG tablet Take 4 mg by mouth 2 (two) times daily before a meal.   Yes Historical Provider, MD  insulin NPH-regular (NOVOLIN 70/30) (70-30) 100 UNIT/ML injection Inject 40-45 Units into the skin 2 (two) times daily with a meal. Take 40 units every morning and 45 units at dinner. Refill x 1. 11/30/12  Yes Clanford Cyndie Mull, MD  lisinopril (PRINIVIL,ZESTRIL) 5 MG tablet Take 1 tablet (5 mg total) by  mouth daily. 11/13/12  Yes Clydia Llano, MD  metFORMIN (GLUCOPHAGE XR) 500 MG 24 hr tablet Take 2 tablets (1,000 mg total) by mouth 2 (two) times daily with a meal. 11/30/12  Yes Clanford L Johnson, MD  neomycin-polymyxin-pramoxine (NEOSPORIN PLUS) 1 % cream Apply 1 application topically 2 (two) times daily. 12/05/12   Yes Ripudeep Jenna Luo, MD  sertraline (ZOLOFT) 25 MG tablet Take 1 tablet (25 mg total) by mouth daily. 11/13/12  Yes Clydia Llano, MD  traMADol (ULTRAM) 50 MG tablet Take 1 tablet (50 mg total) by mouth every 8 (eight) hours as needed for pain. 12/05/12  Yes Ripudeep Jenna Luo, MD     Allergies:  Allergies  Allergen Reactions  . Penicillins Rash    Social History:   reports that she has never smoked. She does not have any smokeless tobacco history on file. She reports that she does not drink alcohol or use illicit drugs.  Family History: Family History  Problem Relation Age of Onset  . Diabetes Father     died in his 9's?  . Diabetes Father     died in her 78's?  . Diabetes Sister     alive and well.     Physical Exam: Filed Vitals:   12/29/12 2321 12/29/12 2352 12/29/12 2355 12/29/12 2358  BP:  161/78 148/81 132/57  Pulse:  81 85 91  Temp: 97.6 F (36.4 C)     TempSrc:      Resp:      SpO2:       Blood pressure 132/57, pulse 91, temperature 97.6 F (36.4 C), temperature source Oral, resp. rate 11, SpO2 95.00%.  GEN:  Pleasant  patient lying in the stretcher in no acute distress; cooperative with exam. PSYCH:  alert and oriented x4; does not appear anxious or depressed; affect is appropriate. HEENT: Mucous membranes pink and anicteric; PERRLA; EOM intact; no cervical lymphadenopathy nor thyromegaly or carotid bruit; no JVD; There were no stridor. Neck is very supple. Breasts:: Not examined CHEST WALL: No tenderness CHEST: Normal respiration, clear to auscultation bilaterally.  HEART: Regular rate and rhythm.  There are no murmur, rub, or gallops.   BACK: No kyphosis or scoliosis; no CVA tenderness ABDOMEN: soft and non-tender; no masses, no organomegaly, normal abdominal bowel sounds; no pannus; no intertriginous candida. There is no rebound and no distention. Rectal Exam: Not done EXTREMITIES: No bone or joint deformity; age-appropriate arthropathy of the hands and knees;  no edema; no ulcerations.  There is no calf tenderness. Genitalia: not examined PULSES: 2+ and symmetric SKIN: Normal hydration no rash or ulceration CNS: Cranial nerves 2-12 grossly intact no focal lateralizing neurologic deficit.  Speech is fluent; uvula elevated with phonation, facial symmetry and tongue midline. DTR are normal bilaterally, cerebella exam is intact, barbinski is negative and strengths are equaled bilaterally.  No sensory loss.   Labs on Admission:  Basic Metabolic Panel:  Recent Labs Lab 12/29/12 2301  NA 131*  K 4.5  CL 96  CO2 27  GLUCOSE 432*  BUN 17  CREATININE 0.67  CALCIUM 9.6   Liver Function Tests:  Recent Labs Lab 12/29/12 2301  AST 18  ALT 39*  ALKPHOS 110  BILITOT 0.3  PROT 7.3  ALBUMIN 3.6   No results found for this basename: LIPASE, AMYLASE,  in the last 168 hours No results found for this basename: AMMONIA,  in the last 168 hours CBC:  Recent Labs Lab 12/29/12 2301  WBC 11.1*  NEUTROABS  5.4  HGB 15.1*  HCT 43.8  MCV 79.9  PLT 240   Cardiac Enzymes:  Recent Labs Lab 12/29/12 2301  TROPONINI <0.30    CBG:  Recent Labs Lab 12/29/12 2219  GLUCAP 418*     Radiological Exams on Admission: Ct Head (brain) Wo Contrast  12/29/2012   *RADIOLOGY REPORT*  Clinical Data: Syncope, out for 10 minutes, preceded by dizziness; now weak on left side  CT HEAD WITHOUT CONTRAST  Technique:  Contiguous axial images were obtained from the base of the skull through the vertex without contrast.  Comparison: 10/31/2012  Findings: Generalized atrophy. Normal ventricular morphology. No midline shift or mass effect. Minimal small vessel chronic ischemic changes of deep cerebral white matter. No intracranial hemorrhage, mass lesion or evidence of acute infarction. No extra-axial fluid collections. Bones and sinuses unremarkable.  IMPRESSION: Atrophy with minimal small vessel chronic ischemic changes of deep cerebral white matter. No acute  intracranial abnormalities.  Findings called to Dr. Karma Ganja on 12/29/2012 at 2250 hours.   Original Report Authenticated By: Ulyses Southward, M.D.    EKG: Independently reviewed. NSR with no acute ST-T changes, no pauses.   Assessment/Plan Present on Admission:  . Syncope . Dizziness . Altered mental status . Unspecified hypothyroidism . PTSD (post-traumatic stress disorder) . Generalized anxiety disorder . Type II or unspecified type diabetes mellitus with unspecified complication, uncontrolled . HTN (hypertension)  PLAN:  Will admit her for recurrent syncopes.  As per neurology, obtain MRI without constrast and EEG.  She will be given ASA, and I have continued her meds.  I will also obtain TSH and ECHO as well.  For her DM, will put on modified carb diet, and use SSI.  Will hold her metformin as she will get the CTPA, though I suspect it will be negative.  She is stable, full code, and will be admitted to [facility] service. Thank you for allowing me to participate in the care of this nice patient.   Other plans as per orders.  Code Status: FULL Unk Lightning, MD. [facility] Pager [phone] 7pm to 7am.  12/30/2012, 12:57 AM

## 2012-12-31 ENCOUNTER — Ambulatory Visit (HOSPITAL_COMMUNITY): Payer: Self-pay

## 2012-12-31 ENCOUNTER — Observation Stay (HOSPITAL_COMMUNITY): Payer: Medicaid Other

## 2012-12-31 LAB — GLUCOSE, CAPILLARY
Glucose-Capillary: 185 mg/dL — ABNORMAL HIGH (ref 70–99)
Glucose-Capillary: 113 mg/dL — ABNORMAL HIGH (ref 70–99)
Glucose-Capillary: 88 mg/dL (ref 70–99)
Glucose-Capillary: 79 mg/dL (ref 70–99)

## 2012-12-31 MED ORDER — LORAZEPAM 2 MG/ML IJ SOLN
0.5000 mg | Freq: Three times a day (TID) | INTRAMUSCULAR | Status: DC | PRN
  Administered 2012-12-31: 0.5 mg via INTRAVENOUS
  Filled 2012-12-31: qty 1

## 2012-12-31 MED ORDER — INSULIN ASPART PROT & ASPART (70-30 MIX) 100 UNIT/ML ~~LOC~~ SUSP: 40.0000 [IU] | Freq: Two times a day (BID) | SUBCUTANEOUS | Status: DC

## 2012-12-31 MED ORDER — INSULIN NPH (HUMAN) (ISOPHANE) 100 UNIT/ML ~~LOC~~ SUSP
20.0000 [IU] | Freq: Two times a day (BID) | SUBCUTANEOUS | Status: DC
  Administered 2012-12-31 – 2013-01-02 (×4): 20 [IU] via SUBCUTANEOUS
  Filled 2012-12-31: qty 10

## 2012-12-31 MED ORDER — INSULIN NPH (HUMAN) (ISOPHANE) 100 UNIT/ML ~~LOC~~ SUSP: 20.0000 [IU] | Freq: Two times a day (BID) | SUBCUTANEOUS | Status: DC

## 2012-12-31 NOTE — Progress Notes (Signed)
Clinical Social Work Department BRIEF PSYCHOSOCIAL ASSESSMENT 12/31/2012  Patient:  Debbie [name], Debbie [name]     Account Number:  [id number]     Admit date:  12/29/2012  Clinical Social Worker:  Lourdes Sledge  Date/Time:  12/31/2012 03:55 PM  Referred by:  Physician  Date Referred:  12/31/2012 Referred for  SNF Placement   Other Referral:   Interview type:  Patient Other interview type:   CSW also completed assessment with sister via an interpretor.    PSYCHOSOCIAL DATA Living Status:  [place] Admitted from facility:   Level of care:   Primary support name:  Debbie [name] [phone] Primary support relationship to patient:  SIBLING Degree of support available:   Pt sister Debbie [name] [phone] presents as pt main support and is actively involved in pt care.    CURRENT CONCERNS Current Concerns  Post-Acute Placement   Other Concerns:    SOCIAL WORK ASSESSMENT / PLAN CSW informed that PT recommending SNF placement. CSW also informed that pt speaks Arabic.    CSW arranged for an Arabic speaking interpretor to be present in order for CSW to assess pt without using pt sister as interpretor. CSW explored pt living situation and amount of support pt has at home. Pt and sister confirmed that pt lives with sister however pt agreeable to MD recommendations for SNF placement. CSW received consent from pt to do a SNF search, follow up with sister Debbie [name] as well as pt friend Debbie [name] [phone]. CSW did make pt and sister aware that due to pt insurance pt may have limited bed offers, pt understanding.    CSW to do a SNF search for [facility] and submit for a Psychologist, forensic.   Assessment/plan status:  Psychosocial Support/Ongoing Assessment of Needs Other assessment/ plan:   Information/referral to community resources:   CSW provided pt sister with a SNF list.    PATIENT'S/FAMILY'S RESPONSE TO PLAN OF CARE: Pt laying in bed with sunglasses. CSW communicated with pt sister and pt via an  interpretor who was present in pt room. Pt confirmed that she is agreeable to recommendations for SNF placement. CSW to remain following for placement.       Theresia Bough, MSW, Theresia Majors [phone]

## 2012-12-31 NOTE — Evaluation (Signed)
Occupational Therapy Evaluation Patient Details Name: Debbie [name] MRN: [id number] DOB: [birth year] Today's Date: 12/31/2012 Time: 4098-1191 OT Time Calculation (min): 45 min  OT Assessment / Plan / Recommendation Clinical Impression  Pt is a 52 yo female who is extremely debilitated requiring mod assist for all mobilty and max assist for most adls.  Unsure of pt's cognitive status due to language barrier but suspect there are deficts.  Pt also appears weak on the R side.  Pt is a fall risk at this point.  Rec SNF at this point in time to try to maximize this pt's functional mobility with adls.  Pt will need significant assist at home and unsure if this is available.      OT Assessment  Patient needs continued OT Services    Follow Up Recommendations  SNF    Barriers to Discharge Decreased caregiver support sister cares for pt and sometimes has help from one other person.  Equipment Recommendations  3 in 1 bedside comode    Recommendations for Other Services    Frequency  Min 2X/week    Precautions / Restrictions Precautions Precautions: Fall Precaution Comments: Pt very high fall risk.  has fallen in past. Restrictions Weight Bearing Restrictions: No Other Position/Activity Restrictions: Chart states pt orthostatic.     Pertinent Vitals/Pain Sister states pt has headache pain and back pain.  BP sitting 124/75 and standing 147/95.  O2 sat and  HR normal.    ADL  Eating/Feeding: Simulated;Moderate assistance Where Assessed - Eating/Feeding: Chair Grooming: Simulated;Moderate assistance Where Assessed - Grooming: Supported sitting Upper Body Bathing: Simulated;Moderate assistance Where Assessed - Upper Body Bathing: Supported sitting Lower Body Bathing: Simulated;Maximal assistance Where Assessed - Lower Body Bathing: Supported sit to stand Upper Body Dressing: Simulated;Maximal assistance Where Assessed - Upper Body Dressing: Supported sitting Lower Body Dressing:  Simulated;Maximal assistance Where Assessed - Lower Body Dressing: Supported sit to Pharmacist, hospital: Performed;Moderate assistance Toilet Transfer Method: Stand pivot Toilet Transfer Equipment: Comfort height toilet Toileting - Clothing Manipulation and Hygiene: Simulated;Maximal assistance Where Assessed - Glass blower/designer Manipulation and Hygiene: Standing Equipment Used: Rolling walker Transfers/Ambulation Related to ADLs: Pt walked toward door.  Pt fatigues very quickly and gives no warning before flopping back in chair.  Pt puts all weight through arms on walker when walking, becomes very SOB and fatigued quickly. ADL Comments: Pt's sister has assisted wtih adls for some time now.  Feel pt has become dependent on her.  Difficult to assess due to language barrier.  Pt either has trouble following commands or was not understanding what was being asked of her.  Sister was translating.    OT Diagnosis: Generalized weakness;Disturbance of vision;Acute pain  OT Problem List: Decreased strength;Decreased range of motion;Decreased activity tolerance;Impaired balance (sitting and/or standing);Impaired vision/perception;Decreased coordination;Decreased cognition;Decreased knowledge of use of DME or AE;Decreased knowledge of precautions;Impaired UE functional use;Pain OT Treatment Interventions: Self-care/ADL training;Therapeutic activities   OT Goals Acute Rehab OT Goals OT Goal Formulation: With patient/family Time For Goal Achievement: 01/14/13 Potential to Achieve Goals: Fair ADL Goals Pt Will Perform Grooming: with min assist;Sitting at sink ADL Goal: Grooming - Progress: Goal set today Pt Will Perform Upper Body Bathing: with min assist;Sitting, chair ADL Goal: Upper Body Bathing - Progress: Goal set today Pt Will Perform Upper Body Dressing: with min assist;Sitting, chair ADL Goal: Upper Body Dressing - Progress: Goal set today Pt Will Transfer to Toilet: with min  assist;Ambulation ADL Goal: Toilet Transfer - Progress: Goal set today Miscellaneous OT Goals  Miscellaneous OT Goal #1: Pt will consistently follow 1 step commands. OT Goal: Miscellaneous Goal #1 - Progress: Goal set today Miscellaneous OT Goal #2: Will further evaluate cognition with translator present. OT Goal: Miscellaneous Goal #2 - Progress: Goal set today  Visit Information  Last OT Received On: 12/31/12 Assistance Needed: +2 PT/OT Co-Evaluation/Treatment: Yes    Subjective Data  Subjective: pt head nodded yes to therapy Patient Stated Goal: none stated when asked to sister.   Prior Functioning     Home Living Lives With: Family Available Help at Discharge: Available 24 hours/day;Family Type of Home: Apartment Home Access: Level entry Home Layout: One level Bathroom Shower/Tub: Engineer, manufacturing systems: Standard Home Adaptive Equipment: Walker - rolling;Shower chair with back Prior Function Level of Independence: Needs assistance Needs Assistance: Bathing;Dressing;Grooming;Toileting;Meal Prep;Light Housekeeping;Gait;Transfers Bath: Maximal Dressing: Maximal Grooming: Maximal Toileting: Maximal Meal Prep: Total Light Housekeeping: Total Gait Assistance: sister states she walks w pt all the time but she can only go short distances. Transfer Assistance: pt needed min assist to get up. Able to Take Stairs?: No Driving: No Comments: sister has assisted pt for some time now.  Feel she may be providing too much assist as pt is very dependent on her. Communication Communication: Prefers language other than English;Other (comment) (Speaks Arabic.  Need translator.  Sister translates some.) Dominant Hand: Right         Vision/Perception Vision - History Baseline Vision: Other (comment) Visual History: Retinopathy;Other (comment) (wearing sunglasses.  Very sensitive to light) Patient Visual Report: Other (comment) (difficult to assess b/c pt could not take  off glasses to eva) Vision - Assessment Vision Assessment: Vision impaired - to be further tested in functional context Additional Comments: Pt asked to read some simple letters from paper. Pt unable.  Unsure if vision issue, cogntive issue or other issue with language. Perception Perception: Not tested Praxis Praxis: Not tested   Cognition  Cognition Arousal/Alertness: Lethargic Behavior During Therapy: WFL for tasks assessed/performed Overall Cognitive Status: Impaired/Different from baseline Area of Impairment: Attention;Following commands Current Attention Level: Focused Following Commands: Follows one step commands inconsistently General Comments: Had sister translating and pt very inconsistent following commands.  Pt shook head  a lot not wanting to participate.  Very difficult to know if this pt has cognitive deficits.  Feel she may due to her presentation.  Sister does not understand all that was being asked so feel her cognition may very well be impaired but hard to know.    Extremity/Trunk Assessment Right Upper Extremity Assessment RUE ROM/Strength/Tone: Deficits RUE ROM/Strength/Tone Deficits: 3/5 strength overall.  would no squeeze hand on command but would on the Left. RUE Sensation: WFL - Light Touch RUE Coordination: Deficits RUE Coordination Deficits: Pt with RUE tremor with movement.  This is baseline.  Fine motor skills impaired. Left Upper Extremity Assessment LUE ROM/Strength/Tone: Deficits LUE ROM/Strength/Tone Deficits: 3/5 overall but would squeeze hand on command (4/5) LUE Sensation: WFL - Light Touch LUE Coordination: Deficits LUE Coordination Deficits: mildly impaired Fine Motor Coordination Trunk Assessment Trunk Assessment: Normal     Mobility Bed Mobility Bed Mobility: Not assessed Transfers Transfers: Sit to Stand;Stand to Sit Sit to Stand: 3: Mod assist;Without upper extremity assist;With armrests;From chair/3-in-1 Stand to Sit: 3: Mod  assist;With upper extremity assist;With armrests;To chair/3-in-1 Details for Transfer Assistance: Pt needed cues for safety, hand position, not to flop back in chair and cues to participate.     Exercise     Balance     End  of Session OT - End of Session Activity Tolerance: Patient limited by fatigue Patient left: in chair;with call bell/phone within reach;with family/visitor present Nurse Communication: Mobility status;Precautions  GO Functional Assessment Tool Used: clinical judement Functional Limitation: Self care Self Care Current Status (R6045): At least 60 percent but less than 80 percent impaired, limited or restricted Self Care Goal Status (W0981): At least 40 percent but less than 60 percent impaired, limited or restricted   Hope Budds 12/31/2012, 11:43 AM [phone]

## 2012-12-31 NOTE — Progress Notes (Signed)
[facility] PROGRESS NOTE  Debbie [name] NWG:[id number] DOB: [birth year] DOA: 12/29/2012 PCP: Standley Dakins, MD  Assessment/Plan: #1. Syncope Questionable etiology. Likely secondary to volume depletion as patient with borderline blood pressure. Will check orthostatics. MRI of the head pending. EEG pending. Continue aspirin. 2-D echo is pending.  Neurology following and appreciate input and recommendations.  #2 hypothyroidism TSH within normal limits at 2.061. CT chest with an enlarged right thyroid lobe. Continue current dose of Synthroid. We'll check a thyroid ultrasound. Followup as outpatient.  #3 type 2 diabetes Elevated CBGs. Increase NPH to 40 units twice daily. D/C Amaryl. Continue sliding scale. Diabetes education.  #4 hypertension Stable. Continue atenolol and lisinopril.  #5 prophylaxis SCDs for DVT prophylaxis.   Code Status: Full Family Communication: Updated patient assessed at bedside. Disposition Plan: Home with home health versus SNF   Consultants:  Neurology: Dr. Roseanne Reno 12/29/2012  Procedures:  MRI head pending  EEG pending  Antibiotics:  None  HPI/Subjective: Patient seen and care with no complaints. Sister at bedside interpreting. Patient states she's feeling much better than she did on admission.  Objective: Filed Vitals:   12/30/12 1607 12/31/12 0000 12/31/12 0400 12/31/12 0724  BP:  104/57 117/53 100/64  Pulse:  71 76 67  Temp:  98.1 F (36.7 C) 97.8 F (36.6 C) 97 F (36.1 C)  TempSrc: Oral   Oral  Resp:  18 16 16   Height:      Weight:      SpO2:  99% 99% 98%    Intake/Output Summary (Last 24 hours) at 12/31/12 0838 Last data filed at 12/30/12 1918  Gross per 24 hour  Intake    480 ml  Output    200 ml  Net    280 ml   Filed Weights   12/30/12 0307  Weight: 101.878 kg (224 lb 9.6 oz)    Exam:   General:  NAD  Cardiovascular: RRR  Respiratory: CTAB  Abdomen: Soft, nontender, nondistended, positive bowel  sounds.  Extremities: No clubbing cyanosis or edema.   Data Reviewed: Basic Metabolic Panel:  Recent Labs Lab 12/29/12 2301 12/30/12 0900  NA 131* 137  K 4.5 3.9  CL 96 102  CO2 27 27  GLUCOSE 432* 253*  BUN 17 13  CREATININE 0.67 0.59  CALCIUM 9.6 8.6   Liver Function Tests:  Recent Labs Lab 12/29/12 2301 12/30/12 0900  AST 18 20  ALT 39* 34  ALKPHOS 110 89  BILITOT 0.3 0.4  PROT 7.3 6.1  ALBUMIN 3.6 3.1*   No results found for this basename: LIPASE, AMYLASE,  in the last 168 hours No results found for this basename: AMMONIA,  in the last 168 hours CBC:  Recent Labs Lab 12/29/12 2301 12/30/12 0900  WBC 11.1* 8.7  NEUTROABS 5.4 3.9  HGB 15.1* 13.6  HCT 43.8 40.2  MCV 79.9 79.6  PLT 240 231   Cardiac Enzymes:  Recent Labs Lab 12/29/12 2301  TROPONINI <0.30   BNP (last 3 results)  Recent Labs  11/12/12 0545  PROBNP 113.9   CBG:  Recent Labs Lab 12/30/12 0741 12/30/12 1141 12/30/12 1613 12/30/12 2012 12/31/12 0722  GLUCAP 216* 218* 243* 185* 113*    No results found for this or any previous visit (from the past 240 hour(s)).   Studies: Ct Head (brain) Wo Contrast  12/29/2012   *RADIOLOGY REPORT*  Clinical Data: Syncope, out for 10 minutes, preceded by dizziness; now weak on left side  CT HEAD WITHOUT CONTRAST  Technique:  Contiguous axial images were obtained from the base of the skull through the vertex without contrast.  Comparison: 10/31/2012  Findings: Generalized atrophy. Normal ventricular morphology. No midline shift or mass effect. Minimal small vessel chronic ischemic changes of deep cerebral white matter. No intracranial hemorrhage, mass lesion or evidence of acute infarction. No extra-axial fluid collections. Bones and sinuses unremarkable.  IMPRESSION: Atrophy with minimal small vessel chronic ischemic changes of deep cerebral white matter. No acute intracranial abnormalities.  Findings called to Dr. Karma Ganja on 12/29/2012 at 2250  hours.   Original Report Authenticated By: Ulyses Southward, M.D.   Ct Angio Chest Pe W/cm &/or Wo Cm  12/30/2012   *RADIOLOGY REPORT*  Clinical Data: Syncope, dizziness, chest pain, history diabetes, hypertension  CT ANGIOGRAPHY CHEST  Technique:  Multidetector CT imaging of the chest using the standard protocol during bolus administration of intravenous contrast. Multiplanar reconstructed images including MIPs were obtained and reviewed to evaluate the vascular anatomy.  Contrast: OMNIPAQUE IOHEXOL 350 MG/ML SOLN  Comparison: 10/31/2012  Findings: Asymmetric enlargement of right thyroid lobe containing calcification and nodule. Aorta normal caliber without aneurysm or dissection. Scattered respiratory motion artifacts. Beam-hardening secondary to body habitus. No evidence of pulmonary embolism identified within limitations as above. No thoracic adenopathy. Visualized portions of liver and spleen unremarkable. Lungs grossly clear. No pleural effusion or pneumothorax. No acute osseous findings.  IMPRESSION: Limitations of exam secondary to beam hardening and respiratory motion. No evidence of pulmonary embolism identified. Enlarged right thyroid lobe containing nodule and calcification. Consider further evaluation with thyroid ultrasound.  If patient is clinically hyperthyroid, consider nuclear medicine thyroid uptake and scan.   Original Report Authenticated By: Ulyses Southward, M.D.    Scheduled Meds: . aspirin  81 mg Oral Daily  . atenolol  50 mg Oral Daily  . glimepiride  4 mg Oral BID AC  . insulin aspart  0-5 Units Subcutaneous QHS  . insulin aspart  0-9 Units Subcutaneous TID WC  . insulin aspart protamine- aspart  35 Units Subcutaneous BID WC  . lisinopril  5 mg Oral Daily  . sertraline  25 mg Oral Daily  . sodium chloride  3 mL Intravenous Q12H   Continuous Infusions:   Principal Problem:   Syncope Active Problems:   Type II or unspecified type diabetes mellitus with unspecified  complication, uncontrolled   HTN (hypertension)   Unspecified hypothyroidism   Diabetic retinopathy   PTSD (post-traumatic stress disorder)   Generalized anxiety disorder   Dizziness   Altered mental status    Time spent: > 35 mins    [facility]  [facility] Pager [phone]. If 7PM-7AM, please contact night-coverage at [email or web address], password [facility] 12/31/2012, 8:38 AM  LOS: 2 days

## 2012-12-31 NOTE — Progress Notes (Signed)
Inpatient Diabetes Program Recommendations  AACE/ADA: New Consensus Statement on Inpatient Glycemic Control (2013)  Target Ranges:  Prepandial:   less than 140 mg/dL      Peak postprandial:   less than 180 mg/dL (1-2 hours)      Critically ill patients:  140 - 180 mg/dL   Reason for Visit: Note patients history of diabetes.  A1C in May was 11.9% indicating poor glycemic control.  CBG's have been much better today however according to flowsheet, patient is not eating.  May need to decrease 70/30 insulin or switch to only NPH insulin since patient not eating.  If switched to NPH only, consider NPH 20 units bid.  Will follow.

## 2012-12-31 NOTE — Progress Notes (Signed)
Clinical Social Work Department CLINICAL SOCIAL WORK PLACEMENT NOTE 12/31/2012  Patient:  Debbie [name], Debbie [name]  Account Number:  [id number] Admit date:  12/29/2012  Clinical Social Worker:  Theresia Bough, Theresia Majors  Date/time:  12/31/2012 04:02 PM  Clinical Social Work is seeking post-discharge placement for this patient at the following level of care:   SKILLED NURSING   (*CSW will update this form in Epic as items are completed)   12/31/2012  Patient/family provided with [facility] Department of Clinical Social Work's list of facilities offering this level of care within the geographic area requested by the patient (or if unable, by the patient's family).  12/31/2012  Patient/family informed of their freedom to choose among providers that offer the needed level of care, that participate in Medicare, Medicaid or managed care program needed by the patient, have an available bed and are willing to accept the patient.  12/31/2012  Patient/family informed of [facility]' ownership interest in [facility], as well as of the fact that they are under no obligation to receive care at this facility.  PASARR submitted to EDS on 12/31/2012 PASARR number received from EDS on   FL2 transmitted to all facilities in geographic area requested by pt/family on  12/31/2012 FL2 transmitted to all facilities within larger geographic area on   Patient informed that his/her managed care company has contracts with or will negotiate with  certain facilities, including the following:     Patient/family informed of bed offers received:   Patient chooses bed at  Physician recommends and patient chooses bed at    Patient to be transferred to  on   Patient to be transferred to facility by   The following physician request were entered in Epic:   Additional Comments:  Theresia Bough, MSW, [employer] [phone]

## 2012-12-31 NOTE — Evaluation (Signed)
Physical Therapy Evaluation Patient Details Name: Debbie [name] MRN: [id number] DOB: [birth year] Today's Date: 12/31/2012 Time: 5621-3086 PT Time Calculation (min): 35 min  PT Assessment / Plan / Recommendation Clinical Impression  Pt is 52 year old that is assisted at home by her sister for the last few months.  pt has history of falls.  pt with syncopal event that brought her to hospital.  pt walked 8 feet wtih mod assist and RW and chair following her.  pt relied heavily on UEs for support.  Decreaased safety awareness.  pt fatiques easily.  Sister feels she will be abel to care for pt at home but pt would be safest in SNF enviromennt with more assist available..  Will continue to follow pt while in hospital.  Sister would benefit from transport wheelchair if pt ends up going home with her.    PT Assessment  Patient needs continued PT services    Follow Up Recommendations  SNF (if not then HHPT)    Does the patient have the potential to tolerate intense rehabilitation      Barriers to Discharge        Equipment Recommendations   (transport wheelchair if home to help with mobilty)    Recommendations for Other Services     Frequency Min 3X/week    Precautions / Restrictions Precautions Precautions: Fall Precaution Comments: Pt very high fall risk.  has fallen in past. Restrictions Weight Bearing Restrictions: No Other Position/Activity Restrictions: Chart states pt orthostatic.     Pertinent Vitals/Pain No specific complaints of pain.  No drop in BP with standing (see OT eval)      Mobility  Bed Mobility Bed Mobility: Not assessed Transfers Transfers: Sit to Stand;Stand to Sit Sit to Stand: 3: Mod assist;With upper extremity assist;From chair/3-in-1 Stand to Sit: 3: Mod assist;With upper extremity assist;To chair/3-in-1 Details for Transfer Assistance: Pt needed cues to reach back for chair and to flex in trunk when sitting to avoid plopping in  chair. Ambulation/Gait Ambulation/Gait Assistance: 3: Mod assist Ambulation Distance (Feet): 8 Feet Assistive device: Rolling walker Ambulation/Gait Assistance Details: Pt walked forward in walker and this allowed her to put full weight through her arms on the walker for support.  when she was tired she just starting sagging and had to have chair pulled up behind her.  Pt stayed in flexed position throughout her walk.  pt was 3/4 dyspnea at end of walk with O2 sats 98%. and pulse 80bpm Gait Pattern: Step-to pattern;Decreased stride length;Shuffle;Trunk flexed    Exercises     PT Diagnosis: Difficulty walking;Generalized weakness  PT Problem List: Decreased strength;Decreased activity tolerance;Decreased mobility;Decreased safety awareness PT Treatment Interventions: DME instruction;Gait training;Functional mobility training;Therapeutic activities;Therapeutic exercise;Patient/family education   PT Goals Acute Rehab PT Goals PT Goal Formulation: With patient/family Pt will go Supine/Side to Sit: with supervision PT Goal: Supine/Side to Sit - Progress: Goal set today Pt will go Sit to Stand: with min assist PT Goal: Sit to Stand - Progress: Goal set today Pt will Transfer Bed to Chair/Chair to Bed: with min assist PT Transfer Goal: Bed to Chair/Chair to Bed - Progress: Goal set today Pt will Ambulate: 1 - 15 feet;with min assist;with rolling walker PT Goal: Ambulate - Progress: Goal set today  Visit Information  Last PT Received On: 12/31/12 Assistance Needed: +2 PT/OT Co-Evaluation/Treatment: Yes Reason Eval/Treat Not Completed: Fatigue/lethargy limiting ability to participate    Subjective Data  Subjective: OK   Prior Functioning  Home Living Lives With:  Family Available Help at Discharge: Available 24 hours/day;Family Type of Home: Apartment Home Access: Level entry Home Layout: One level Bathroom Shower/Tub: Engineer, manufacturing systems: Standard Home Adaptive  Equipment: Walker - rolling Prior Function Level of Independence: Needs assistance Needs Assistance: Gait;Transfers Bath: Maximal Dressing: Maximal Grooming: Maximal Toileting: Maximal Meal Prep: Total Light Housekeeping: Total Gait Assistance: sister states she walks with pt with RW when she is up Transfer Assistance: Pt needed min assis tto get up Able to Take Stairs?: No Driving: No Comments: sister has assisted pt for some time now.  Feel she may be providing too much assist as pt is very dependent on her. Communication Communication: Prefers language other than English Dominant Hand: Right    Cognition  Cognition Arousal/Alertness: Lethargic Behavior During Therapy: WFL for tasks assessed/performed Overall Cognitive Status: Impaired/Different from baseline Area of Impairment: Attention;Following commands Current Attention Level: Focused Following Commands: Follows one step commands inconsistently General Comments: Had sister translating and pt very inconsistent following commands.  Pt shook head  a lot not wanting to participate.  Very difficult to know if this pt has cognitive deficits.  Feel she may due to her presentation.  Sister does not understand all that was being asked so feel her cognition may very well be impaired but hard to know.    Extremity/Trunk Assessment Right Upper Extremity Assessment RUE ROM/Strength/Tone: Deficits RUE ROM/Strength/Tone Deficits: 3/5 strength overall.  would no squeeze hand on command but would on the Left. RUE Sensation: WFL - Light Touch RUE Coordination: Deficits RUE Coordination Deficits: Pt with RUE tremor with movement.  This is baseline.  Fine motor skills impaired. Left Upper Extremity Assessment LUE ROM/Strength/Tone: Deficits LUE ROM/Strength/Tone Deficits: 3/5 overall but would squeeze hand on command (4/5) LUE Sensation: WFL - Light Touch LUE Coordination: Deficits LUE Coordination Deficits: mildly impaired Fine Motor  Coordination Right Lower Extremity Assessment RLE ROM/Strength/Tone: Deficits RLE ROM/Strength/Tone Deficits: Pt would kick legs up in sitting - right leg grossly 3-/5 Left Lower Extremity Assessment LLE ROM/Strength/Tone: Deficits LLE ROM/Strength/Tone Deficits: pt would kick up leg in sitting - left leg grossly 3/5 Trunk Assessment Trunk Assessment:  (pt stood with flexed trunk)   Balance Balance Balance Assessed: No  End of Session PT - End of Session Equipment Utilized During Treatment: Gait belt Activity Tolerance: Patient limited by fatigue Patient left: in chair;with call bell/phone within reach;with family/visitor present Nurse Communication: Mobility status  GP     Judson Roch 12/31/2012, 1:02 PM    Ranae Palms, PT

## 2012-12-31 NOTE — Progress Notes (Signed)
NEURO HOSPITALIST PROGRESS NOTE   SUBJECTIVE:                                                                                                                        Patient lying in bed in no distress feeling better today but still having some back pain.   OBJECTIVE:                                                                                                                           Vital signs in last 24 hours: Temp:  [97 F (36.1 C)-98.1 F (36.7 C)] 97.5 F (36.4 C) (06/23 1151) Pulse Rate:  [65-77] 65 (06/23 1151) Resp:  [16-18] 16 (06/23 0724) BP: (100-147)/(53-95) 113/79 mmHg (06/23 1151) SpO2:  [96 %-99 %] 96 % (06/23 1151)  Intake/Output from previous day: 06/22 0701 - 06/23 0700 In: 480 [P.O.:480] Out: 200 [Urine:200] Intake/Output this shift: Total I/O In: 220 [P.O.:220] Out: -  Nutritional status: Dysphagia  Past Medical History  Diagnosis Date  . Type II or unspecified type diabetes mellitus with unspecified complication, uncontrolled     x ~ 10 years  . Hypertension     x ~ 10 years  . Hypothyroidism   . Chest pain     a. ? MI in 2010, [place]->medically managed  . Diabetic foot ulcer   . Obesity     Neurologic Exam:  Mental Status: Alert, follows all visual commands Cranial Nerves: II: blinks to threat but will not participate in visual testing due to eye pain per interperter III,IV, VI: EOMI and pupils equal V,VII: face symmetric, facial light touch sensation normal bilaterally VIII: normal XI: bilateral shoulder shrug XII: midline tongue extension Motor: Moving all extremities antigravity with no lateralizing weakness Tone and bulk:normal tone throughout; no atrophy noted Sensory: withdraws to pain in all extremities Deep Tendon Reflexes:  1+ and symetric Plantars: mute    Lab Results: No results found for this basename: cbc, bmp, coags, chol, tri, ldl, hga1c   Lipid Panel No results found for  this basename: CHOL, TRIG, HDL, CHOLHDL, VLDL, LDLCALC,  in the last 72 hours  Studies/Results: Ct Head (brain) Wo Contrast  12/29/2012   *RADIOLOGY REPORT*  Clinical Data: Syncope, out  for 10 minutes, preceded by dizziness; now weak on left side  CT HEAD WITHOUT CONTRAST  Technique:  Contiguous axial images were obtained from the base of the skull through the vertex without contrast.  Comparison: 10/31/2012  Findings: Generalized atrophy. Normal ventricular morphology. No midline shift or mass effect. Minimal small vessel chronic ischemic changes of deep cerebral white matter. No intracranial hemorrhage, mass lesion or evidence of acute infarction. No extra-axial fluid collections. Bones and sinuses unremarkable.  IMPRESSION: Atrophy with minimal small vessel chronic ischemic changes of deep cerebral white matter. No acute intracranial abnormalities.  Findings called to Dr. Karma Ganja on 12/29/2012 at 2250 hours.   Original Report Authenticated By: Ulyses Southward, M.D.   Ct Angio Chest Pe W/cm &/or Wo Cm  12/30/2012   *RADIOLOGY REPORT*  Clinical Data: Syncope, dizziness, chest pain, history diabetes, hypertension  CT ANGIOGRAPHY CHEST  Technique:  Multidetector CT imaging of the chest using the standard protocol during bolus administration of intravenous contrast. Multiplanar reconstructed images including MIPs were obtained and reviewed to evaluate the vascular anatomy.  Contrast: OMNIPAQUE IOHEXOL 350 MG/ML SOLN  Comparison: 10/31/2012  Findings: Asymmetric enlargement of right thyroid lobe containing calcification and nodule. Aorta normal caliber without aneurysm or dissection. Scattered respiratory motion artifacts. Beam-hardening secondary to body habitus. No evidence of pulmonary embolism identified within limitations as above. No thoracic adenopathy. Visualized portions of liver and spleen unremarkable. Lungs grossly clear. No pleural effusion or pneumothorax. No acute osseous findings.  IMPRESSION:  Limitations of exam secondary to beam hardening and respiratory motion. No evidence of pulmonary embolism identified. Enlarged right thyroid lobe containing nodule and calcification. Consider further evaluation with thyroid ultrasound.  If patient is clinically hyperthyroid, consider nuclear medicine thyroid uptake and scan.   Original Report Authenticated By: Ulyses Southward, M.D.    MEDICATIONS                                                                                                                        Scheduled: . aspirin  81 mg Oral Daily  . atenolol  50 mg Oral Daily  . insulin aspart  0-5 Units Subcutaneous QHS  . insulin aspart  0-9 Units Subcutaneous TID WC  . insulin NPH  20 Units Subcutaneous BID  . lisinopril  5 mg Oral Daily  . sertraline  25 mg Oral Daily  . sodium chloride  3 mL Intravenous Q12H    ASSESSMENT/PLAN:  Patient remains in no distress, no localizing or lateralizing weakness.  MRI head shows no acute infarct.   Recommend:  1) Continue ASA 2) No further neurological work up. 3) EEG may be followed out patient  Neurology S/O  Assessment and plan discussed with with attending physician and they are in agreement.    Felicie Morn PA-C [facility] [phone]  12/31/2012, 3:50 PM

## 2013-01-01 ENCOUNTER — Observation Stay (HOSPITAL_COMMUNITY): Payer: Medicaid Other

## 2013-01-01 DIAGNOSIS — E039 Hypothyroidism, unspecified: Secondary | ICD-10-CM

## 2013-01-01 DIAGNOSIS — R55 Syncope and collapse: Secondary | ICD-10-CM

## 2013-01-01 LAB — CBC
MCV: 81.1 fL (ref 78.0–100.0)
Platelets: 228 10*3/uL (ref 150–400)
RBC: 5.14 MIL/uL — ABNORMAL HIGH (ref 3.87–5.11)
RDW: 13.4 % (ref 11.5–15.5)
WBC: 10.6 10*3/uL — ABNORMAL HIGH (ref 4.0–10.5)

## 2013-01-01 LAB — BASIC METABOLIC PANEL
CO2: 27 mEq/L (ref 19–32)
Calcium: 8.3 mg/dL — ABNORMAL LOW (ref 8.4–10.5)
Creatinine, Ser: 0.78 mg/dL (ref 0.50–1.10)
GFR calc Af Amer: >90 mL/min (ref >90)
GFR calc non Af Amer: >90 mL/min (ref >90)
Sodium: 139 mEq/L (ref 135–145)

## 2013-01-01 NOTE — Progress Notes (Signed)
*  PRELIMINARY RESULTS* Echocardiogram 2D Echocardiogram has been performed.  Debbie [name] 01/01/2013, 9:25 AM

## 2013-01-01 NOTE — Progress Notes (Signed)
PT evaluation Addendum Late entry for 25-Jan-2013   January 25, 2013 1301  PT G-Codes **NOT FOR INPATIENT CLASS**  Functional Assessment Tool Used clinical judgement  Functional Limitation Mobility: Walking and moving around  Mobility: Walking and Moving Around Current Status [phone]) CK  Mobility: Walking and Moving Around Goal Status (G4010) CI  PT General Charges  $$ ACUTE PT VISIT 1 Procedure  PT Evaluation  $Initial PT Evaluation Tier I 1 Procedure  PT Treatments  $Gait Training 23-37 mins  01/01/2013 Corlis Hove, PT [phone]

## 2013-01-01 NOTE — Progress Notes (Signed)
[facility] PROGRESS NOTE  Debbie [name] ZOX:[id number] DOB: 1960-08-16 DOA: 12/29/2012 PCP: Standley Dakins, MD  Assessment/Plan: #1. Syncope Questionable etiology. Likely secondary to volume depletion as patient with borderline blood pressure. Patient not orthostatic. MRI of the head negative for acute abnormality. EEG pending. Continue aspirin. 2-D echo is within normal limits..  Neurology following and appreciate input and recommendations.  #2 hypothyroidism TSH within normal limits at 2.061. CT chest with an enlarged right thyroid lobe. Ultrasound with bilateral heterogeneous nodules greater on the right. Continue current dose of Synthroid. Followup as outpatient.  #3 type 2 diabetes CBGs 79 - 232.  Decreased NPH to 20 units twice daily. D/C Amaryl. Continue sliding scale. Diabetes education. F/u with PCP as outpatient  #4 hypertension Stable. Continue atenolol and lisinopril.  #5 prophylaxis SCDs for DVT prophylaxis.   Code Status: Full Family Communication: Updated patient assessed at bedside. Disposition Plan: SNF when bed available.   Consultants:  Neurology: Dr. Roseanne Reno 12/29/2012  Procedures:  MRI head: 12/31/12   EEG 01/01/13  2-D echo 01/01/2013  Thyroid ultrasound 12/31/2012  Antibiotics:  None  HPI/Subjective: Patient seen and care with no complaints. Sister at bedside interpreting. Patient states she's feeling much better than she did on admission.  Objective: Filed Vitals:   01/01/13 0400 01/01/13 0735 01/01/13 1009 01/01/13 1213  BP: 114/71 118/70 133/78 126/74  Pulse: 71 68  63  Temp: 97.8 F (36.6 C) 97.7 F (36.5 C)  97.7 F (36.5 C)  TempSrc: Oral     Resp: 18 18  16   Height:      Weight: 101.334 kg (223 lb 6.4 oz)     SpO2: 97% 99%  99%    Intake/Output Summary (Last 24 hours) at 01/01/13 1828 Last data filed at 01/01/13 1005  Gross per 24 hour  Intake    120 ml  Output    300 ml  Net   -180 ml   Filed Weights    12/30/12 0307 01/01/13 0400  Weight: 101.878 kg (224 lb 9.6 oz) 101.334 kg (223 lb 6.4 oz)    Exam:   General:  NAD  Cardiovascular: RRR  Respiratory: CTAB  Abdomen: Soft, nontender, nondistended, positive bowel sounds.  Extremities: No clubbing cyanosis or edema.   Data Reviewed: Basic Metabolic Panel:  Recent Labs Lab 12/29/12 2301 12/30/12 0900 01/01/13 0503  NA 131* 137 139  K 4.5 3.9 3.7  CL 96 102 104  CO2 27 27 27   GLUCOSE 432* 253* 138*  BUN 17 13 21   CREATININE 0.67 0.59 0.78  CALCIUM 9.6 8.6 8.3*   Liver Function Tests:  Recent Labs Lab 12/29/12 2301 12/30/12 0900  AST 18 20  ALT 39* 34  ALKPHOS 110 89  BILITOT 0.3 0.4  PROT 7.3 6.1  ALBUMIN 3.6 3.1*   No results found for this basename: LIPASE, AMYLASE,  in the last 168 hours No results found for this basename: AMMONIA,  in the last 168 hours CBC:  Recent Labs Lab 12/29/12 2301 12/30/12 0900 01/01/13 0503  WBC 11.1* 8.7 10.6*  NEUTROABS 5.4 3.9  --   HGB 15.1* 13.6 13.7  HCT 43.8 40.2 41.7  MCV 79.9 79.6 81.1  PLT 240 231 228   Cardiac Enzymes:  Recent Labs Lab 12/29/12 2301  TROPONINI <0.30   BNP (last 3 results)  Recent Labs  11/12/12 0545  PROBNP 113.9   CBG:  Recent Labs Lab 12/31/12 1607 12/31/12 2105 01/01/13 0734 01/01/13 1212 01/01/13 1651  GLUCAP 79 232*  126* 177* 191*    Recent Results (from the past 240 hour(s))  URINE CULTURE     Status: None   Collection Time    12/30/12  7:17 PM      Result Value Range Status   Specimen Description URINE, RANDOM   Final   Special Requests NONE   Final   Culture  Setup Time 12/31/2012 00:18   Final   Colony Count >=100,000 COLONIES/ML   Final   Culture GRAM NEGATIVE RODS   Final   Report Status PENDING   Incomplete     Studies: Mr Brain Wo Contrast  12/31/2012   *RADIOLOGY REPORT*  Clinical Data: Hypertensive diabetic patient with syncopal episode.  MRI HEAD WITHOUT CONTRAST  Technique:  Multiplanar,  multiecho pulse sequences of the brain and surrounding structures were obtained according to standard protocol without intravenous contrast.  Comparison: 12/29/2012 CT.  No comparison MR.  Findings: No acute infarct.  Right frontal lobe areas of blood breakdown products may represent changes from prior trauma or hemorrhagic ischemia.  Scattered mild nonspecific white matter type changes most likely represents result of small vessel disease in this diabetic hypertensive patient.  Global atrophy without hydrocephalus.  No intracranial mass lesion detected on this unenhanced exam.  Cervical medullary junction and pineal region unremarkable.  Minimal exophthalmos.  Mild degenerative changes C3-4 incompletely assessed on the present exam.  Major intracranial vascular structures are patent.  Partially empty sella may be an incidental finding.  This can be seen in patients with pseudotumor cerebri.  Prominent Meckel's cave may incidental finding.  IMPRESSION: No acute infarct.  Right frontal lobe areas of blood breakdown products may represent changes from prior trauma or hemorrhagic ischemia.  Scattered mild nonspecific white matter type changes most likely represents result of small vessel disease in this diabetic hypertensive patient.  Global atrophy without hydrocephalus.  Partially empty sella may be an incidental finding.  This can be seen in patients with pseudotumor cerebri.   Original Report Authenticated By: Lacy Duverney, M.D.   US Soft Tissue Head/neck  12/31/2012   *RADIOLOGY REPORT*  Clinical Data: Thyroid nodules.  THYROID ULTRASOUND  Technique: Ultrasound examination of the thyroid gland and adjacent soft tissues was performed.  Comparison:  None.  Findings:  Right thyroid lobe:  5.2 x 2.4 x 3.1 cm. Left thyroid lobe:  5.2 x 1.9 x 1.8 cm. Isthmus:  4 mm.  Focal nodules:  Multiple bilateral thyroid nodules.  The dominant nodule on the right is a solid mixed density nodule in the mid to lower pole measuring  3.6 x 2.3 x 2.2 cm.  Scattered microcalcifications.  In the upper pole on the right, there is a solid 2.7 x 2.1 x 1.6 cm.  This extends into the isthmus.  On the left, there is a 2.1 x 1.3 x 1.3 cm mid to lower pole nodule with heterogeneous echotexture.  Lymphadenopathy:  Small scattered cervical lymph nodes, none pathologically enlarged based on short axis diameter.  IMPRESSION: Bilateral solid heterogeneous nodules, the largest being in the right lower pole measuring 3.6 cm with scattered microcalcifications.  Recommend tissue sampling.   Original Report Authenticated By: Charlett Nose, M.D.    Scheduled Meds: . aspirin  81 mg Oral Daily  . atenolol  50 mg Oral Daily  . insulin aspart  0-5 Units Subcutaneous QHS  . insulin aspart  0-9 Units Subcutaneous TID WC  . insulin NPH  20 Units Subcutaneous BID  . lisinopril  5 mg Oral Daily  .  sertraline  25 mg Oral Daily  . sodium chloride  3 mL Intravenous Q12H   Continuous Infusions:   Principal Problem:   Syncope Active Problems:   Type II or unspecified type diabetes mellitus with unspecified complication, uncontrolled   HTN (hypertension)   Unspecified hypothyroidism   Diabetic retinopathy   PTSD (post-traumatic stress disorder)   Generalized anxiety disorder   Dizziness   Altered mental status    Time spent: > 35 mins    [facility]  [facility] Pager [phone]. If 7PM-7AM, please contact night-coverage at [email or web address], password [facility] 01/01/2013, 6:28 PM  LOS: 3 days

## 2013-01-01 NOTE — Progress Notes (Signed)
Clinical Child psychotherapist (CSW) visited pt room and communicated with pt and sister via a Theme park manager. CSW informed pt that unfortunately pt does not have any bed offers at this moment. CSW inquired whether pt would be agreeable to a facility outside of [facility], pt agreeable. CSW has expanded SNF search and submitted additional clinicals requested by pasarr for a 30 day pasarr #. CSW to remain following.  Theresia Bough, MSW, Theresia Majors [phone]

## 2013-01-01 NOTE — Progress Notes (Signed)
BSE addendum late entry for 01/09/13  01/09/13 1430  SLP G-Codes **NOT FOR INPATIENT CLASS**  Functional Assessment Tool Used clinical judgement  Functional Limitations Swallowing  Swallow Current Status (J1914) CI  Swallow Goal Status (N8295) CI  SLP Evaluations  $ SLP Speech Visit 1 Procedure  SLP Evaluations  $BSS Swallow 1 Procedure  $Swallowing Treatment 1 Procedure  $Self Care/Home Management 8-22  Moreen Fowler MS, CCC-SLP

## 2013-01-01 NOTE — Progress Notes (Signed)
EEG Completed; Results Pending  

## 2013-01-01 NOTE — Procedures (Signed)
ELECTROENCEPHALOGRAM REPORT   Patient: Debbie [name]       Room #: 1O10 EEG No. ID: [id number] Age: 52 y.o.        Sex: female Referring Physician: Janee Morn Report Date:  01/01/2013        Interpreting Physician: Thana Farr D  History: [name] is an 52 y.o. female with syncope evaluated to rule out seizure  Medications:  Scheduled: . aspirin  81 mg Oral Daily  . atenolol  50 mg Oral Daily  . insulin aspart  0-5 Units Subcutaneous QHS  . insulin aspart  0-9 Units Subcutaneous TID WC  . insulin NPH  20 Units Subcutaneous BID  . lisinopril  5 mg Oral Daily  . sertraline  25 mg Oral Daily  . sodium chloride  3 mL Intravenous Q12H    Conditions of Recording:  This is a 16 channel EEG carried out with the patient in the awake and drowsy states.  Description:  The waking background activity consists of a low voltage, symmetrical, fairly well organized, 10 Hz alpha activity, seen from the parieto-occipital and posterior temporal regions.  Low voltage fast activity, poorly organized, is seen anteriorly and is at times superimposed on more posterior regions.  A mixture of theta and alpha rhythms are seen from the central and temporal regions. The patient drowses with slowing to irregular, low voltage theta and beta activity.   Stage II sleep is not obtained. Hyperventilation and intermittent photic stimulation were not performed.  IMPRESSION: This is a normal EEG   Thana Farr, MD [facility] [phone] 01/01/2013, 5:22 PM

## 2013-01-01 NOTE — Progress Notes (Signed)
Occupational Therapy Treatment Patient Details Name: Debbie [name] MRN: [id number] DOB: October 26, 1960 Today's Date: 01/01/2013 Time: 4098-1191 OT Time Calculation (min): 25 min  OT Assessment / Plan / Recommendation Comments on Treatment Session Pt continues to be limited by pain and fatigue; unable to participate for long with functional  Mobility tasks. Pt continues to request sister preform all tasks for her without trying. Pt does continue to present with delayed processing , decr  sequencing. decr following commands even when translated requiring tactile cues. Pt continues to benefit from skilled intervention.    Follow Up Recommendations  SNF    Barriers to Discharge       Equipment Recommendations  3 in 1 bedside comode    Recommendations for Other Services    Frequency Min 2X/week   Plan Discharge plan remains appropriate    Precautions / Restrictions Precautions Precautions: Fall Precaution Comments: Pt very high fall risk.  has fallen in past. Restrictions Weight Bearing Restrictions: No   Pertinent Vitals/Pain C/o ongoing back pain; relief with rest.    ADL  Transfers/Ambulation Related to ADLs: Pt performed sit to stand with mod to max A with sister's encouragement. Pt unable to maintain upright standing for any length of time due to back discomfort and requested to sit back down. ADL Comments: Pt continues to be dependent with ADL. Asked pt to don flip flops in her room but pt unable and requested sister to perform. Pt setup with breakfast and pt again requested sister to feed her. Pt with slow responses to her sister and appears to have dealyed processing . Pt continues to have difficulty with following commands, even with translation.     OT Diagnosis:    OT Problem List:   OT Treatment Interventions:     OT Goals ADL Goals ADL Goal: Toilet Transfer - Progress: Progressing toward goals Miscellaneous OT Goals OT Goal: Miscellaneous Goal #1 - Progress: Progressing  toward goals OT Goal: Miscellaneous Goal #2 - Progress: Progressing toward goals  Visit Information  Last OT Received On: 01/01/13 Assistance Needed: +2    Subjective Data  Subjective: Pt agreeable to a little therapy.   Prior Functioning       Cognition  Cognition Arousal/Alertness: Lethargic Behavior During Therapy: WFL for tasks assessed/performed Overall Cognitive Status: Impaired/Different from baseline Area of Impairment: Orientation;Following commands;Problem solving Current Attention Level: Focused Following Commands: Follows one step commands inconsistently Problem Solving: Slow processing;Difficulty sequencing;Requires tactile cues    Mobility  Transfers Sit to Stand: 3: Mod assist;With upper extremity assist;From chair/3-in-1 Stand to Sit: 3: Mod assist;With upper extremity assist;To chair/3-in-1 Details for Transfer Assistance: Pt needed cues to reach back for chair and to flex in trunk when sitting to avoid plopping in chair.    Exercises      Balance Balance Balance Assessed: Yes Static Standing Balance Static Standing - Level of Assistance: 3: Mod assist Static Standing - Comment/# of Minutes: Unable to maintain standing position for any length of time.   End of Session OT - End of Session Activity Tolerance: Patient limited by fatigue;Patient limited by pain Patient left: in chair;with call bell/phone within reach;with family/visitor present  GO     Roney Mans [facility] 01/01/2013, 10:29 AM

## 2013-01-01 NOTE — Care Management Note (Signed)
    Page 1 of 1   01/01/2013     1:48:25 PM   CARE MANAGEMENT NOTE 01/01/2013  Patient:  Debbie [name], Debbie [name]   Account Number:  [id number]  Date Initiated:  01/01/2013  Documentation initiated by:  GRAVES-BIGELOW,Domini Vandehei  Subjective/Objective Assessment:   Pt admitted with syncope. PT recommends SNF. CSW is working with pt and sister for disposition needs.     Action/Plan:   CM will continue to monitor for needs.   Anticipated DC Date:  01/02/2013   Anticipated DC Plan:  SKILLED NURSING FACILITY  In-house referral  Clinical Social Worker      DC Planning Services  CM consult      Choice offered to / List presented to:             Status of service:  In process, will continue to follow Medicare Important Message given?   (If response is "NO", the following Medicare IM given date fields will be blank) Date Medicare IM given:   Date Additional Medicare IM given:    Discharge Disposition:    Per UR Regulation:  Reviewed for med. necessity/level of care/duration of stay  If discussed at Long Length of Stay Meetings, dates discussed:    Comments:

## 2013-01-02 LAB — GLUCOSE, CAPILLARY
Glucose-Capillary: 220 mg/dL — ABNORMAL HIGH (ref 70–99)
Glucose-Capillary: 120 mg/dL — ABNORMAL HIGH (ref 70–99)

## 2013-01-02 LAB — URINE CULTURE

## 2013-01-02 MED ORDER — ACETAMINOPHEN 325 MG PO TABS: 650.0000 mg | ORAL_TABLET | Freq: Four times a day (QID) | ORAL | Status: DC | PRN

## 2013-01-02 MED ORDER — SAXAGLIPTIN HCL 5 MG PO TABS: 10.0000 mg | ORAL_TABLET | Freq: Every day | ORAL | Status: DC

## 2013-01-02 MED ORDER — INSULIN NPH ISOPHANE & REGULAR (70-30) 100 UNIT/ML ~~LOC~~ SUSP: 20.0000 [IU] | Freq: Two times a day (BID) | SUBCUTANEOUS | Status: DC

## 2013-01-02 NOTE — Progress Notes (Signed)
c HOME HEALTH AGENCIES SERVING [place]   Agencies that are Medicare-Certified and are affiliated with The [facility] Home Health Agency  Telephone Number Address  [facility]   The [facility] has ownership interest in this company; however, you are under no obligation to use this agency. [phone] or  [phone] [address] [place], [place] [address] [email or web address]   Agencies that are Medicare-Certified and are not affiliated with The [facility] Agency Telephone Number Address  [facility] [phone] Fax [phone] [address] [place], [place]  [address] [email or web address]  [facility] [phone] or [phone] Fax [phone] [address] [place], [place] [address] [email or web address]  [facility] [phone] Fax [phone] [address] [place], [place] [address] [email or web address]  [facility] [phone] Fax [phone] [address] [place], [place]  [address] [email or web address]  [facility] [phone] Fax [phone] [address] Suite [place], [place] [address] [email or web address]  [facility] [phone] [address] [place], [place] [address] [email or web address]  [facility] [phone]  [address] [place], [place] [address] [email or web address]  [facility] [phone] or [phone] Fax number [phone] [address] [place], [place]  [address] [email or web address]  [facility] [phone] Fax [phone] [address] [place], [place]  [address]  [facility]  [phone] Fax [phone] [address] [place], [place] [address] [email or web address]       Agencies that are not Medicare-Certified and are not affiliated with [facility] Agency Telephone Number Address  [facility] [place] [phone] or [phone] Fax [phone] [address] [place], [place]  [address] [email or web address]  [facility] [phone] Fax [phone] [address] [place], [place]  [address] [email or web address]  [facility] [phone] Fax [phone] [address] [place], [place]  [address] [email or web address]  [facility]  [phone] Fax [phone] [place], [place] [address] [email or web address]  [facility] [phone] Fax [phone] [address] [place], [place] [address]  [facility] [phone] or [phone] Fax [phone] [address] [place], [place]  [address] [email or web address]://[email or web address]  [facility] [phone] or [phone] Fax [phone] [address] [place] [place], [place]  [address] [email or web address]://[email or web address]  [facility] [phone] Fax [phone] [address] [place], [place]  [address] [email or web address]  [facility] [phone] [address] [place], [place]  [address]  [facility] [phone] Fax [phone] [address] [place], [place]  [address]  [facility] [phone] Fax [phone] [address] [place], [place]  [address] [email or web address]  [facility] [phone] Fax [phone] [address] [place], [place] [address] [email or web address]  [facility] [phone] Fax [phone] [address] [place],  [place]  [address] [email or web address]

## 2013-01-02 NOTE — Progress Notes (Signed)
Physical Therapy Treatment Patient Details Name: Debbie [name] MRN: [id number] DOB: 07-13-1960 Today's Date: 01/02/2013 Time: 1015-1040 PT Time Calculation (min): 25 min  PT Assessment / Plan / Recommendation  PT Comments   Pt presents with severe back pain she reports (via sister) as radiating into both hips. She is unable to participate in bedlevel activities due to severity of pain therefore unable to progress mobility today.  Note history of back surgery date not specificed in H&P, and pt medicated.  Plan to assess whether medication in effective in both reducing baseline pain and in facilitating ability to participate in therapy.  Pt appears willing to try if pain can be effectively managed.  Sister at bedside throughout.  SNF search ongoing.  Follow Up Recommendations  SNF     Does the patient have the potential to tolerate intense rehabilitation     Barriers to Discharge        Equipment Recommendations  Rolling walker with 5" wheels;Hospital bed;Wheelchair (measurements PT) (to be determined next venue, dependent on progress!!)    Recommendations for Other Services    Frequency Min 2X/week   Progress towards PT Goals Progress towards PT goals: Not progressing toward goals - comment (severe pain limited progress)  Plan Frequency needs to be updated;Discharge plan needs to be updated    Precautions / Restrictions     Pertinent Vitals/Pain 4/10 headache, 10/10 back/hip pain    Mobility  Bed Mobility Bed Mobility: Not assessed (pt unable to tolerate more than 10-15 degree PROM to legs) Transfers Transfers: Not assessed (severe pain limiting ) Ambulation/Gait Ambulation/Gait Assistance: Not tested (comment) (severe back pain limiting)    Exercises General Exercises - Lower Extremity Ankle Circles/Pumps: PROM;Both;10 reps;Supine;Limitations Ankle Circles/Pumps Limitations: total neuropathy, 0/5 noted supine with tactile and verbal cues Heel Slides: PROM;Both;10  reps;Supine;Limitations Heel Slides Limitations: provokes back pain, wincing, Right worse than Left, with 1/5 noted on RIGHT and 2/5 noted on LEFT.   PT Diagnosis:    PT Problem List:   PT Treatment Interventions:     PT Goals    Visit Information  Last PT Received On: 01/02/13 History of Present Illness: Pt presents with severe back pain she reports (via sister) as radiating into both hips. She is unable to participate in bedlevel activities due to severity of pain therefore unable to progress mobility today.  Note history of back surgery date not specificed in H&P, and pt medicated.  Plan to assess whether medication in effective in both reducing baseline pain and in facilitating ability to participate in therapy.  Pt appears willing to try if pain can be effectively managed.  Sister at bedside throughout.  SNF search ongoing.    Subjective Data      Cognition  Cognition Overall Cognitive Status: Difficult to assess Current Attention Level: Sustained Difficult to assess due to: Non-English speaking    Balance     End of Session PT - End of Session Activity Tolerance: Patient limited by pain Patient left: in bed;with family/visitor present;with call bell/phone within reach Nurse Communication: Patient requests pain meds;Mobility status (medicated once pain identified)   GP Functional Limitation: Mobility: Walking and moving around;Changing and maintaining body position Mobility: Walking and Moving Around Current Status [phone]): 100 percent impaired, limited or restricted Mobility: Walking and Moving Around Goal Status [phone]): At least 1 percent but less than 20 percent impaired, limited or restricted Changing and Maintaining Body Position Current Status (Q6578): At least 80 percent but less than 100 percent impaired, limited or  restricted Changing and Maintaining Body Position Goal Status [phone]): At least 1 percent but less than 20 percent impaired, limited or restricted    Dennis Bast 01/02/2013, 11:05 AM

## 2013-01-02 NOTE — Progress Notes (Signed)
   CARE MANAGEMENT NOTE 01/02/2013  Patient:  Debbie [name], Debbie [name]   Account Number:  [id number]  Date Initiated:  01/01/2013  Documentation initiated by:  GRAVES-BIGELOW,BRENDA  Subjective/Objective Assessment:   Pt admitted with syncope. PT recommends SNF. CSW is working with pt and sister for disposition needs.     Action/Plan:   CM will continue to monitor for needs.   Anticipated DC Date:  01/02/2013   Anticipated DC Plan:  HOME W HOME HEALTH SERVICES  In-house referral  Clinical Social Worker      DC Planning Services  CM consult      [facility] Choice  HOME HEALTH  DURABLE MEDICAL EQUIPMENT   Choice offered to / List presented to:  C-5 Sibling   DME arranged  3-N-1      DME agency  [facility]     HH arranged  HH-1 RN  HH-2 PT  HH-3 OT      [facility] agency  [facility]   Status of service:  Completed, signed off Medicare Important Message given?   (If response is "NO", the following Medicare IM given date fields will be blank) Date Medicare IM given:   Date Additional Medicare IM given:    Discharge Disposition:  HOME W HOME HEALTH SERVICES  Per UR Regulation:  Reviewed for med. necessity/level of care/duration of stay  If discussed at Long Length of Stay Meetings, dates discussed:    Comments:  01/02/13- 1500- Donn Pierini RN, BSN [phone] Pt for d/c today- plan was for SNF- but due to language barriers SNF was unable to accept pt- pt now to go home with sister with [facility]- in to speak with sister regarding HH arrangements- list for [facility] reviewed with sister- per choice [facility]- explained that [facility] services were not 24/7 but there to support her - referral for HH-RN/PT/OT called to Lupita Leash with [facility]- services to begin within 24-48 hr. post discharge- DME needs reviewed with sister also- pt already has RW at home- 3n1 ordered and [facility] to bring to room prior to discharge. Any further DME needs sister will f/u with pt's  PCP. CSW to arrange for ambulance transportation home and give sister info on medical/medicaid transportation options for doctor's appointments and such.

## 2013-01-02 NOTE — Progress Notes (Deleted)
Utilization review completed.  

## 2013-01-02 NOTE — Progress Notes (Addendum)
CSW received call from [facility] SNF that they would not be able to extend a bed offer. Patient to return home with her sister.   CSW consulted for transportation needs. Patient will need non-emergency ambulance transport home. CSW confirmed home address with patient's sister, Kathlyn [place]. Sharin Mons called for transport. No other CSW needs identified - CSW signing off.   Unice Bailey, LCSW Clinical Social Worker cell #: [phone]

## 2013-01-02 NOTE — Progress Notes (Signed)
Speech Language Pathology Dysphagia Treatment Patient Details Name: Debbie [name] MRN: [id number] DOB: [birth year] Today's Date: 01/02/2013 Time: 4098-1191 SLP Time Calculation (min): 18 min  Assessment / Plan / Recommendation Clinical Impression  Dysphagia treatment with family/friend present.  Pt. initially refused stating (via family/friend) that stomach has been "upset" and if she eats/drinks she will "feel bad".  She agreed, with mod encouragment to a straw sip thin water (no solids).  No s/s aspiration although complains of globus sensation and points to pharynx (history of enlarged thyroid).   She is afebrile and lung sounds unchanged per RN documentation.  She stated she would try to eat soup, therefore SLP called nutrition services and added soup to lunch and dinner tray.  Pt. given much encouragment to attempt soup today even if she is not hungry to promote healing.  SLP will continue to follow.  Will keep diet texture Dys 3 at this time.       Diet Recommendation  Continue with Current Diet: Dysphagia 3 (mechanical soft);Thin liquid    SLP Plan Continue with current plan of care   Pertinent Vitals/Pain none   Swallowing Goals  SLP Swallowing Goals Patient will utilize recommended strategies during swallow to increase swallowing safety with: Modified independent assistance Swallow Study Goal #2 - Progress: Progressing toward goal  General Temperature Spikes Noted: No Respiratory Status: Room air Behavior/Cognition: Alert;Cooperative;Pleasant mood Oral Cavity - Dentition: Adequate natural dentition Patient Positioning: Upright in bed  Oral Cavity - Oral Hygiene Does patient have any of the following "at risk" factors?: Nutritional status - inadequate Brush patient's teeth BID with toothbrush (using toothpaste with fluoride): Yes Patient is AT RISK - Oral Care Protocol followed (see row info): Yes   Dysphagia Treatment Treatment focused on: Skilled observation of diet  tolerance Treatment Methods/Modalities: Skilled observation Patient observed directly with PO's: Yes Type of PO's observed: Thin liquids Feeding: Able to feed self;Needs assist;Needs set up Liquids provided via: Straw Pharyngeal Phase Signs & Symptoms: Complaints of globus Type of cueing: Verbal Amount of cueing: Minimal      Royce Macadamia M.Ed [employer] [phone]  01/02/2013

## 2013-01-02 NOTE — Progress Notes (Signed)
Discharge instructions reviewed with patient, sister, and interpreter with MD in attendance.  Pt. stated she comprehended the instructions through the interpreter.  Pt. able to repeat instructions on insulin administration and diet to me with minimal correction with assistance of her sister, who is also diabetic.  Pt. to receive further instruction from  Community diabetes resource.  Pt. Discharged to home via stretcher ambulance where she will receive assistance from her sister.

## 2013-01-02 NOTE — Discharge Summary (Addendum)
Physician Discharge Summary  Ravleen Ries ZOX:[id number] DOB: 09-01-1960 DOA: 12/29/2012  PCP: Standley Dakins, MD  Admit date: 12/29/2012 Discharge date: 01/02/2013  Time spent: 35 minutes  Recommendations for Outpatient Follow-up:  1. Needs maximal assistance with PT/OT and RN at home 2. Recommend out-patient follow up for chronic LBP 3. Recommend continued psychiatric work-up as out-patient 4. Have d/c Atenolol in hospital-might continue Lisinopril if prn  Discharge Diagnoses:  Principal Problem:   Syncope Active Problems:   Type II or unspecified type diabetes mellitus with unspecified complication, uncontrolled   HTN (hypertension)   Unspecified hypothyroidism   Diabetic retinopathy   PTSD (post-traumatic stress disorder)   Generalized anxiety disorder   Dizziness   Altered mental status   Discharge Condition: fair   Diet recommendation:  Diabetic heart healthy  Filed Weights   12/30/12 0307 01/01/13 0400  Weight: 101.878 kg (224 lb 9.6 oz) 101.334 kg (223 lb 6.4 oz)    History of present illness:  This 52 year old Arabic female was admitted 12/30/2012 history of sudden loss of consciousness for about 10 minutes no chest pain seizure palpitation or urinary incontinence no post syncopal confusion. She said these episodes over the past 2 months and is compliant with her medications. Workup initially was normal with blood sugars 400s otherwise without any other issue-she was admitted.  Hospital Course:  #1. Syncope  Questionable etiology. Likely secondary to volume depletion as patient with borderline blood pressure. Patient not orthostatic. MRI of the head negative for acute abnormality. EEG performed 6/24 not suggestive of any seizure-like activity. Continue aspirin. 2-D echo is within normal limits.. Neurology did not recommend any further input. Helps translate it was determined that when patient goes from laying to sitting she does feel dizzy which may be secondary to  her atenolol which was discontinued. Physical therapy recommended because of her lower back pain SNF placement however she fluid through the translator home and was discharged home with home health PT OT #2 hypothyroidism  TSH within normal limits at 2.061. CT chest with an enlarged right thyroid lobe. Ultrasound with bilateral heterogeneous nodules greater on the right. Continue current dose of Synthroid. Followup as outpatient.  #3 type 2 diabetes  CBGs 79 - 232. Decreased NPH to 20 units twice daily. D/C Amaryl. While in hospital but this was reimplemented along with metformin on discharge .Diabetes education. F/u with PCP as outpatient  #4 hypertension  Stable. Continue ice and 5 mg, will need to discontinue atenolol unless there is another strong indication #5 anxiety and adjustment She will continue her regular dose of sertraline 25 mg daily #6 chronic low back pain Patient will continue tramadol every 8 when necessary as well as Tylenol on a when necessary basis #7 Neuropathy-recommend as an outpatient consideration of alert her gabapentin  Consultants:  Neurology: Dr. Roseanne Reno 12/29/2012 Procedures:  MRI head: 12/31/12  EEG 01/01/13  2-D echo 01/01/2013  Thyroid ultrasound 12/31/2012 Antibiotics:  None   Discharge Exam: Filed Vitals:   01/01/13 1009 01/01/13 1213 01/01/13 2051 01/02/13 0455  BP: 133/78 126/74 107/73 116/69  Pulse:  63 63 69  Temp:  97.7 F (36.5 C) 98.1 F (36.7 C) 97.8 F (36.6 C)  TempSrc:   Oral Oral  Resp:  16 22 20   Height:      Weight:      SpO2:  99% 98% 97%   Alert pleasant oriented in NAD  General: alert pleasant no issue Cardiovascular: s1s 2 no m/r/g Respiratory: clear  Discharge Instructions  Future Appointments Provider Department Dept Phone   01/03/2013 3:15 PM [facility] Covering Provider [facility] Joan Flores [phone]   01/28/2013 8:30 AM Vevelyn Royals, RD [facility] [phone]       Medication List    STOP taking these medications       atenolol 50 MG tablet  Commonly known as:  TENORMIN     metFORMIN 500 MG 24 hr tablet  Commonly known as:  GLUCOPHAGE XR      TAKE these medications       acetaminophen 325 MG tablet  Commonly known as:  TYLENOL  Take 2 tablets (650 mg total) by mouth every 6 (six) hours as needed.     aspirin 81 MG chewable tablet  Chew 1 tablet (81 mg total) by mouth daily.     glimepiride 4 MG tablet  Commonly known as:  AMARYL  Take 4 mg by mouth 2 (two) times daily before a meal.     insulin NPH-regular (70-30) 100 UNIT/ML injection  Commonly known as:  NOVOLIN 70/30  Inject 20 Units into the skin 2 (two) times daily with a meal. Take 40 units every morning and 45 units at dinner. Refill x 1.     lisinopril 5 MG tablet  Commonly known as:  PRINIVIL,ZESTRIL  Take 1 tablet (5 mg total) by mouth daily.     neomycin-polymyxin-pramoxine 1 % cream  Commonly known as:  NEOSPORIN PLUS  Apply 1 application topically 2 (two) times daily.     saxagliptin HCl 5 MG Tabs tablet  Commonly known as:  ONGLYZA  Take 2 tablets (10 mg total) by mouth daily.     sertraline 25 MG tablet  Commonly known as:  ZOLOFT  Take 1 tablet (25 mg total) by mouth daily.     traMADol 50 MG tablet  Commonly known as:  ULTRAM  Take 1 tablet (50 mg total) by mouth every 8 (eight) hours as needed for pain.       Allergies  Allergen Reactions  . Penicillins Rash      The results of significant diagnostics from this hospitalization (including imaging, microbiology, ancillary and laboratory) are listed below for reference.    Significant Diagnostic Studies: Ct Head (brain) Wo Contrast  12/29/2012   *RADIOLOGY REPORT*  Clinical Data: Syncope, out for 10 minutes, preceded by dizziness; now weak on left side  CT HEAD WITHOUT CONTRAST  Technique:  Contiguous axial images were obtained from the base of the skull through the vertex  without contrast.  Comparison: 10/31/2012  Findings: Generalized atrophy. Normal ventricular morphology. No midline shift or mass effect. Minimal small vessel chronic ischemic changes of deep cerebral white matter. No intracranial hemorrhage, mass lesion or evidence of acute infarction. No extra-axial fluid collections. Bones and sinuses unremarkable.  IMPRESSION: Atrophy with minimal small vessel chronic ischemic changes of deep cerebral white matter. No acute intracranial abnormalities.  Findings called to Dr. Karma Ganja on 12/29/2012 at 2250 hours.   Original Report Authenticated By: Ulyses Southward, M.D.   Ct Angio Chest Pe W/cm &/or Wo Cm  12/30/2012   *RADIOLOGY REPORT*  Clinical Data: Syncope, dizziness, chest pain, history diabetes, hypertension  CT ANGIOGRAPHY CHEST  Technique:  Multidetector CT imaging of the chest using the standard protocol during bolus administration of intravenous contrast. Multiplanar reconstructed images including MIPs were obtained and reviewed to evaluate the vascular anatomy.  Contrast: OMNIPAQUE IOHEXOL 350 MG/ML SOLN  Comparison: 10/31/2012  Findings: Asymmetric enlargement of right thyroid lobe containing calcification and nodule. Aorta normal caliber without aneurysm or dissection. Scattered respiratory motion artifacts. Beam-hardening secondary to body habitus. No evidence of pulmonary embolism identified within limitations as above. No thoracic adenopathy. Visualized portions of liver and spleen unremarkable. Lungs grossly clear. No pleural effusion or pneumothorax. No acute osseous findings.  IMPRESSION: Limitations of exam secondary to beam hardening and respiratory motion. No evidence of pulmonary embolism identified. Enlarged right thyroid lobe containing nodule and calcification. Consider further evaluation with thyroid ultrasound.  If patient is clinically hyperthyroid, consider nuclear medicine thyroid uptake and scan.   Original Report Authenticated By: Ulyses Southward,  M.D.   Mr Brain Wo Contrast  12/31/2012   *RADIOLOGY REPORT*  Clinical Data: Hypertensive diabetic patient with syncopal episode.  MRI HEAD WITHOUT CONTRAST  Technique:  Multiplanar, multiecho pulse sequences of the brain and surrounding structures were obtained according to standard protocol without intravenous contrast.  Comparison: 12/29/2012 CT.  No comparison MR.  Findings: No acute infarct.  Right frontal lobe areas of blood breakdown products may represent changes from prior trauma or hemorrhagic ischemia.  Scattered mild nonspecific white matter type changes most likely represents result of small vessel disease in this diabetic hypertensive patient.  Global atrophy without hydrocephalus.  No intracranial mass lesion detected on this unenhanced exam.  Cervical medullary junction and pineal region unremarkable.  Minimal exophthalmos.  Mild degenerative changes C3-4 incompletely assessed on the present exam.  Major intracranial vascular structures are patent.  Partially empty sella may be an incidental finding.  This can be seen in patients with pseudotumor cerebri.  Prominent Meckel's cave may incidental finding.  IMPRESSION: No acute infarct.  Right frontal lobe areas of blood breakdown products may represent changes from prior trauma or hemorrhagic ischemia.  Scattered mild nonspecific white matter type changes most likely represents result of small vessel disease in this diabetic hypertensive patient.  Global atrophy without hydrocephalus.  Partially empty sella may be an incidental finding.  This can be seen in patients with pseudotumor cerebri.   Original Report Authenticated By: Lacy Duverney, M.D.   US Soft Tissue Head/neck  12/31/2012   *RADIOLOGY REPORT*  Clinical Data: Thyroid nodules.  THYROID ULTRASOUND  Technique: Ultrasound examination of the thyroid gland and adjacent soft tissues was performed.  Comparison:  None.  Findings:  Right thyroid lobe:  5.2 x 2.4 x 3.1 cm. Left thyroid lobe:  5.2  x 1.9 x 1.8 cm. Isthmus:  4 mm.  Focal nodules:  Multiple bilateral thyroid nodules.  The dominant nodule on the right is a solid mixed density nodule in the mid to lower pole measuring 3.6 x 2.3 x 2.2 cm.  Scattered microcalcifications.  In the upper pole on the right, there is a solid 2.7 x 2.1 x 1.6 cm.  This extends into the isthmus.  On the left, there is a 2.1 x 1.3 x 1.3 cm mid to lower pole nodule with heterogeneous echotexture.  Lymphadenopathy:  Small scattered cervical lymph nodes, none pathologically enlarged based on short axis diameter.  IMPRESSION: Bilateral solid heterogeneous nodules, the largest being in the right lower pole measuring 3.6 cm with scattered microcalcifications.  Recommend tissue sampling.   Original Report Authenticated By: Charlett Nose, M.D.   Dg Foot Complete Right  12/12/2012   *RADIOLOGY REPORT*  Clinical Data: Swelling over the right fourth toe  RIGHT FOOT COMPLETE - 3+ VIEW  Comparison: 10/26/2012  Findings: Vascular calcifications are noted.  Mild degenerative change of  the first metatarsal phalangeal joint.  Lisfranc joint is normal in appearance.  No fracture or dislocation.  No osseous destruction or sclerosis. Mild plantar calcaneal spurring is noted. No soft tissue abnormality.  IMPRESSION: No acute osseous abnormality.  No specific plain film evidence for osteomyelitis.  If there is continued concern, MRI of the foot with contrast would be more sensitive and specific for evaluation if symptoms continue.   Original Report Authenticated By: Christiana Pellant, M.D.    Microbiology: Recent Results (from the past 240 hour(s))  URINE CULTURE     Status: None   Collection Time    12/30/12  7:17 PM      Result Value Range Status   Specimen Description URINE, RANDOM   Final   Special Requests NONE   Final   Culture  Setup Time 12/31/2012 00:18   Final   Colony Count >=100,000 COLONIES/ML   Final   Culture GRAM NEGATIVE RODS   Final   Report Status PENDING    Incomplete     Labs: Basic Metabolic Panel:  Recent Labs Lab 12/29/12 2301 12/30/12 0900 01/01/13 0503  NA 131* 137 139  K 4.5 3.9 3.7  CL 96 102 104  CO2 27 27 27   GLUCOSE 432* 253* 138*  BUN 17 13 21   CREATININE 0.67 0.59 0.78  CALCIUM 9.6 8.6 8.3*   Liver Function Tests:  Recent Labs Lab 12/29/12 2301 12/30/12 0900  AST 18 20  ALT 39* 34  ALKPHOS 110 89  BILITOT 0.3 0.4  PROT 7.3 6.1  ALBUMIN 3.6 3.1*   No results found for this basename: LIPASE, AMYLASE,  in the last 168 hours No results found for this basename: AMMONIA,  in the last 168 hours CBC:  Recent Labs Lab 12/29/12 2301 12/30/12 0900 01/01/13 0503  WBC 11.1* 8.7 10.6*  NEUTROABS 5.4 3.9  --   HGB 15.1* 13.6 13.7  HCT 43.8 40.2 41.7  MCV 79.9 79.6 81.1  PLT 240 231 228   Cardiac Enzymes:  Recent Labs Lab 12/29/12 2301  TROPONINI <0.30   BNP: BNP (last 3 results)  Recent Labs  11/12/12 0545  PROBNP 113.9   CBG:  Recent Labs Lab 01/01/13 1212 01/01/13 1651 01/01/13 2054 01/02/13 0716 01/02/13 1123  GLUCAP 177* 191* 220* 104* 120*       Signed:  Rhetta Mura  [facility] 01/02/2013, 1:54 PM

## 2013-01-03 ENCOUNTER — Other Ambulatory Visit: Payer: Self-pay | Admitting: Family Medicine

## 2013-01-03 ENCOUNTER — Ambulatory Visit: Payer: Self-pay

## 2013-01-03 ENCOUNTER — Telehealth: Payer: Self-pay | Admitting: Family Medicine

## 2013-01-03 DIAGNOSIS — E1165 Type 2 diabetes mellitus with hyperglycemia: Secondary | ICD-10-CM

## 2013-01-03 DIAGNOSIS — IMO0002 Reserved for concepts with insufficient information to code with codable children: Secondary | ICD-10-CM

## 2013-01-03 NOTE — Telephone Encounter (Signed)
Ms. Currie case worker Evon Slack) came in and stated that the pt was just seen at the hospital. Ms. Logan Bores expressed that the pt. Needed a referral to the endocrinologist. Please f/u with pt. ~CC

## 2013-01-09 ENCOUNTER — Ambulatory Visit: Payer: Medicaid Other | Attending: Family Medicine | Admitting: Internal Medicine

## 2013-01-09 ENCOUNTER — Encounter: Payer: Self-pay | Admitting: Internal Medicine

## 2013-01-09 ENCOUNTER — Telehealth: Payer: Self-pay | Admitting: Family Medicine

## 2013-01-09 VITALS — BP 126/77 | HR 85 | Temp 98.3°F | Resp 18

## 2013-01-09 DIAGNOSIS — E119 Type 2 diabetes mellitus without complications: Secondary | ICD-10-CM | POA: Insufficient documentation

## 2013-01-09 DIAGNOSIS — I10 Essential (primary) hypertension: Secondary | ICD-10-CM

## 2013-01-09 MED ORDER — LOTEPREDNOL ETABONATE 0.5 % OP GEL: 1.0000 | Freq: Two times a day (BID) | OPHTHALMIC | Status: DC

## 2013-01-09 MED ORDER — LISINOPRIL 5 MG PO TABS: 5.0000 mg | ORAL_TABLET | Freq: Every day | ORAL | Status: DC

## 2013-01-09 MED ORDER — INSULIN PEN NEEDLE 32G X 4 MM MISC: 1.0000 | Freq: Three times a day (TID) | Status: DC

## 2013-01-09 MED ORDER — INSULIN ASPART 100 UNIT/ML FLEXPEN: 20.0000 [IU] | Freq: Three times a day (TID) | SUBCUTANEOUS | Status: DC

## 2013-01-09 MED ORDER — SAXAGLIPTIN HCL 5 MG PO TABS: 10.0000 mg | ORAL_TABLET | Freq: Every day | ORAL | Status: DC

## 2013-01-09 MED ORDER — SERTRALINE HCL 25 MG PO TABS: 25.0000 mg | ORAL_TABLET | Freq: Every day | ORAL | Status: DC

## 2013-01-09 MED ORDER — GLIMEPIRIDE 4 MG PO TABS: 4.0000 mg | ORAL_TABLET | Freq: Two times a day (BID) | ORAL | Status: DC

## 2013-01-09 NOTE — Patient Instructions (Addendum)

## 2013-01-09 NOTE — Progress Notes (Signed)
Patient ID: Debbie [name], female   DOB: 1960/09/07, 52 y.o.   MRN: [id number]  CC: follow up on diabetes  HPI: Pt is 52 yo female with Diabetes, insulin depended and with complications of retinopathy who presents to clinic with main concern of progressively worsening fatigue, poor vision. Patient explains she went to see eye doctor today and was sent to clinic as she has run out of the insulin and has not checked her sugar levels over 3 days. She has also not taken insulin over several days as family explains they have no money to afford insulin. She feels very tired and has poor oral intake, per family member she hasn't been eating and drinking much over the past 3 days. Patient speaks Arabic and we have interpreter and around. Patient denies fevers and chills, no specific abdominal or urinary concerns, no chest pain or shortness of breath.  Allergies  Allergen Reactions  . Penicillins Rash   Past Medical History  Diagnosis Date  . Type II or unspecified type diabetes mellitus with unspecified complication, uncontrolled     x ~ 10 years  . Hypertension     x ~ 10 years  . Hypothyroidism   . Chest pain     a. ? MI in 2010, [place]->medically managed  . Diabetic foot ulcer   . Obesity    Current Outpatient Prescriptions on File Prior to Visit  Medication Sig Dispense Refill  . acetaminophen (TYLENOL) 325 MG tablet Take 2 tablets (650 mg total) by mouth every 6 (six) hours as needed.      Marland Kitchen aspirin 81 MG chewable tablet Chew 1 tablet (81 mg total) by mouth daily.  30 tablet  0  . neomycin-polymyxin-pramoxine (NEOSPORIN PLUS) 1 % cream Apply 1 application topically 2 (two) times daily.      . traMADol (ULTRAM) 50 MG tablet Take 1 tablet (50 mg total) by mouth every 8 (eight) hours as needed for pain.  60 tablet  1   No current facility-administered medications on file prior to visit.   Family History  Problem Relation Age of Onset  . Diabetes Father     died in his 32's?  . Diabetes  Father     died in her 57's?  . Diabetes Sister     alive and well.   History   Social History  . Marital Status: Single    Spouse Name: N/A    Number of Children: N/A  . Years of Education: N/A   Occupational History  . Not on file.   Social History Main Topics  . Smoking status: Never Smoker   . Smokeless tobacco: Not on file  . Alcohol Use: No  . Drug Use: No  . Sexually Active: Not on file   Other Topics Concern  . Not on file   Social History Narrative   From [place], [place].  Moved to [place] to live with sister in April of 2014.  She does not routinely exercise.    Review of Systems  Constitutional: Negative for fever, chills, diaphoresis  HENT: Negative for ear pain, nosebleeds, congestion, facial swelling, rhinorrhea, neck pain, neck stiffness and ear discharge.   Eyes: Negative for pain, discharge, redness, itching and visual disturbance.  Respiratory: Negative for cough, choking, chest tightness, shortness of breath, wheezing and stridor.   Cardiovascular: Negative for chest pain, palpitations and leg swelling.  Gastrointestinal: Negative for abdominal distention.  Genitourinary: Negative for dysuria, urgency, frequency, hematuria, flank pain, decreased urine volume, difficulty urinating  and dyspareunia.  Musculoskeletal: Negative for back pain, joint swelling, arthralgias and gait problem.  Neurological: Negative for dizziness, tremors, seizures, syncope, facial asymmetry, speech difficulty, light-headedness, numbness and headaches.  Hematological: Negative for adenopathy. Does not bruise/bleed easily.  Psychiatric/Behavioral: Negative for hallucinations, behavioral problems, confusion, dysphoric mood, decreased concentration and agitation.    Objective:   Filed Vitals:   01/09/13 1542  BP: 126/77  Pulse: 85  Temp: 98.3 F (36.8 C)  Resp: 18    Physical Exam  Constitutional: Appears well-developed and well-nourished. No distress.  HENT: Normocephalic.  External right and left ear normal. Dry MM Eyes: Conjunctivae and EOM are normal. PERRLA, no scleral icterus.  Neck: Normal ROM. Neck supple. No JVD. No tracheal deviation. No thyromegaly.  CVS: RRR, S1/S2 +, no murmurs, no gallops, no carotid bruit.  Pulmonary: Effort and breath sounds normal, no stridor, decreased breath sounds at bases  Abdominal: Soft. BS +,  no distension, tenderness, rebound or guarding.  Musculoskeletal: Normal range of motion. No edema and no tenderness.  Lymphadenopathy: No lymphadenopathy noted, cervical, inguinal. Neuro: Alert. Normal reflexes, muscle tone coordination. No cranial nerve deficit. Skin: Skin is warm and dry. No rash noted. Not diaphoretic. No erythema. No pallor.  Psychiatric: Normal mood and affect. Behavior, judgment, thought content normal.   Lab Results  Component Value Date   WBC 10.6* 01/01/2013   HGB 13.7 01/01/2013   HCT 41.7 01/01/2013   MCV 81.1 01/01/2013   PLT 228 01/01/2013   Lab Results  Component Value Date   CREATININE 0.78 01/01/2013   BUN 21 01/01/2013   NA 139 01/01/2013   K 3.7 01/01/2013   CL 104 01/01/2013   CO2 27 01/01/2013    Lab Results  Component Value Date   HGBA1C 11.9* 11/09/2012   Lipid Panel  No results found for this basename: chol, trig, hdl, cholhdl, vldl, ldlcalc       Assessment and plan:   Patient Active Problem List   Diagnosis Date Noted  . Diabetic retinopathy - eye doctor appointment was today 01/08/2013 12/12/2012  . Type II or unspecified type diabetes mellitus with unspecified complication, uncontrolled - Will provide a sample of NovoLog from clinic, patient advised to take 20 units of insulin in the morning time and 20 units in the evening time. We have discussed checking blood sugar levels regularly, prescriptions for supplies provided, we will also check electrolyte panel today in the clinic to ensure that that is stable. I have advised patient that if the numbers on electrolyte panel are  unstable will call them and have them come back to clinic or emergency department if needed.    11/09/2012  . HTN (hypertension) - reasonable control on today's visit  11/09/2012

## 2013-01-09 NOTE — Progress Notes (Signed)
ARABIC PT COMES HERE ACCOMPANIED BY SISTER/TRANSLATOR WITH C/O BILAT PAIN EYES. PT WAS SEEN AT EYE DOCTOR TODAY AND GIVEN SAMPLE EYE DROPS AND TOLD TO COME HERE FOR POSS EYE HEMORRHAGE.

## 2013-01-09 NOTE — Telephone Encounter (Signed)
1. Home health nurse called pt needs refills.  Most of scripts were put ordered by historical Dr and pt missed f/u visit on 6/26.  Nurse was advised that pt would need to come in for f/u especially to fill certain scripts so pt is scheduled for visit on 01/09/13.      lisinopril (PRINIVIL,ZESTRIL) 5 MG tablet sertraline (ZOLOFT) 25 MG tablet glimepiride (AMARYL) 4 MG tablet atenolol (TENORMIN) 50 MG tablet neomycin-polymyxin-pramoxine (NEOSPORIN PLUS) 1 % cream  2. Need script for Acuecheck test strips, pt has Medicaid and needs this script.   3. Following meds were ordered on 01/03/13 but [facility] pharmacy says they do not have script acetaminophen (TYLENOL) 325 MG tablet insulin NPH-regular (NOVOLIN 70/30) (70-30) 100 UNIT/ML injection

## 2013-01-10 LAB — BASIC METABOLIC PANEL
Calcium: 9.8 mg/dL (ref 8.4–10.5)
Glucose, Bld: 225 mg/dL — ABNORMAL HIGH (ref 70–99)
Potassium: 5.2 mEq/L (ref 3.5–5.3)
Sodium: 139 mEq/L (ref 135–145)

## 2013-01-14 ENCOUNTER — Telehealth: Payer: Self-pay | Admitting: Internal Medicine

## 2013-01-14 NOTE — Telephone Encounter (Signed)
Pt's blood sugar levels around 200-454.  Taking insulin 3 times a day even though Pt not eating well.  Nurse would like to supplement pt's diet with glucerna to supplement diet since pt has lack of appetite. Pt has abd pain in left lower quadrant, nurse could not hear bowel sounds.  Pt still has bad headache pain and eye pain, pt does not want to move due to pain including back pain. Lung sounds clear but respirations are swallow, about 25.  Pt says she has chest pains when takes deep breaths.    Pt's ultram and zoloft, causing strong interaction.  Nurse not giving pt zoloft until hears back from Dr.

## 2013-01-16 ENCOUNTER — Encounter (HOSPITAL_COMMUNITY): Payer: Self-pay

## 2013-01-16 ENCOUNTER — Emergency Department (HOSPITAL_COMMUNITY): Payer: Medicaid Other

## 2013-01-16 ENCOUNTER — Inpatient Hospital Stay (HOSPITAL_COMMUNITY)
Admission: EM | Admit: 2013-01-16 | Discharge: 2013-01-19 | DRG: 299 | Disposition: A | Discharge status: Home / Self Care | Payer: Medicaid Other | Attending: Internal Medicine | Admitting: Internal Medicine

## 2013-01-16 DIAGNOSIS — E1159 Type 2 diabetes mellitus with other circulatory complications: Secondary | ICD-10-CM

## 2013-01-16 DIAGNOSIS — L97519 Non-pressure chronic ulcer of other part of right foot with unspecified severity: Secondary | ICD-10-CM

## 2013-01-16 DIAGNOSIS — L97509 Non-pressure chronic ulcer of other part of unspecified foot with unspecified severity: Secondary | ICD-10-CM | POA: Diagnosis present

## 2013-01-16 DIAGNOSIS — R55 Syncope and collapse: Secondary | ICD-10-CM

## 2013-01-16 DIAGNOSIS — E1142 Type 2 diabetes mellitus with diabetic polyneuropathy: Secondary | ICD-10-CM

## 2013-01-16 DIAGNOSIS — E11621 Type 2 diabetes mellitus with foot ulcer: Secondary | ICD-10-CM | POA: Diagnosis present

## 2013-01-16 DIAGNOSIS — I252 Old myocardial infarction: Secondary | ICD-10-CM

## 2013-01-16 DIAGNOSIS — M545 Low back pain, unspecified: Secondary | ICD-10-CM

## 2013-01-16 DIAGNOSIS — F4310 Post-traumatic stress disorder, unspecified: Secondary | ICD-10-CM

## 2013-01-16 DIAGNOSIS — R739 Hyperglycemia, unspecified: Secondary | ICD-10-CM

## 2013-01-16 DIAGNOSIS — K5900 Constipation, unspecified: Secondary | ICD-10-CM | POA: Diagnosis present

## 2013-01-16 DIAGNOSIS — F411 Generalized anxiety disorder: Secondary | ICD-10-CM

## 2013-01-16 DIAGNOSIS — R0682 Tachypnea, not elsewhere classified: Secondary | ICD-10-CM

## 2013-01-16 DIAGNOSIS — Z794 Long term (current) use of insulin: Secondary | ICD-10-CM

## 2013-01-16 DIAGNOSIS — G9341 Metabolic encephalopathy: Secondary | ICD-10-CM | POA: Diagnosis present

## 2013-01-16 DIAGNOSIS — R2681 Unsteadiness on feet: Secondary | ICD-10-CM

## 2013-01-16 DIAGNOSIS — L989 Disorder of the skin and subcutaneous tissue, unspecified: Secondary | ICD-10-CM

## 2013-01-16 DIAGNOSIS — E118 Type 2 diabetes mellitus with unspecified complications: Secondary | ICD-10-CM

## 2013-01-16 DIAGNOSIS — E669 Obesity, unspecified: Secondary | ICD-10-CM | POA: Diagnosis present

## 2013-01-16 DIAGNOSIS — IMO0002 Reserved for concepts with insufficient information to code with codable children: Secondary | ICD-10-CM

## 2013-01-16 DIAGNOSIS — Z833 Family history of diabetes mellitus: Secondary | ICD-10-CM

## 2013-01-16 DIAGNOSIS — R4182 Altered mental status, unspecified: Secondary | ICD-10-CM

## 2013-01-16 DIAGNOSIS — E039 Hypothyroidism, unspecified: Secondary | ICD-10-CM | POA: Diagnosis present

## 2013-01-16 DIAGNOSIS — E1151 Type 2 diabetes mellitus with diabetic peripheral angiopathy without gangrene: Secondary | ICD-10-CM | POA: Diagnosis present

## 2013-01-16 DIAGNOSIS — F4323 Adjustment disorder with mixed anxiety and depressed mood: Secondary | ICD-10-CM | POA: Diagnosis present

## 2013-01-16 DIAGNOSIS — E1165 Type 2 diabetes mellitus with hyperglycemia: Secondary | ICD-10-CM

## 2013-01-16 DIAGNOSIS — I739 Peripheral vascular disease, unspecified: Secondary | ICD-10-CM

## 2013-01-16 DIAGNOSIS — E1169 Type 2 diabetes mellitus with other specified complication: Secondary | ICD-10-CM | POA: Diagnosis present

## 2013-01-16 DIAGNOSIS — I10 Essential (primary) hypertension: Secondary | ICD-10-CM

## 2013-01-16 DIAGNOSIS — E11628 Type 2 diabetes mellitus with other skin complications: Secondary | ICD-10-CM

## 2013-01-16 DIAGNOSIS — R42 Dizziness and giddiness: Secondary | ICD-10-CM

## 2013-01-16 LAB — CBC WITH DIFFERENTIAL/PLATELET
Basophils Absolute: 0 10*3/uL (ref 0.0–0.1)
Eosinophils Absolute: 0.1 10*3/uL (ref 0.0–0.7)
Eosinophils Relative: 1 % (ref 0–5)
HCT: 43 % (ref 36.0–46.0)
Lymphocytes Relative: 38 % (ref 12–46)
MCH: 27.3 pg (ref 26.0–34.0)
MCHC: 34.4 g/dL (ref 30.0–36.0)
MCV: 79.3 fL (ref 78.0–100.0)
Monocytes Absolute: 0.4 10*3/uL (ref 0.1–1.0)
Platelets: 238 10*3/uL (ref 150–400)
RDW: 13.1 % (ref 11.5–15.5)

## 2013-01-16 LAB — URINALYSIS, ROUTINE W REFLEX MICROSCOPIC
Bilirubin Urine: NEGATIVE
Glucose, UA: >1000 mg/dL — AB
Hgb urine dipstick: NEGATIVE
Ketones, ur: NEGATIVE mg/dL
Protein, ur: NEGATIVE mg/dL
Urobilinogen, UA: 0.2 mg/dL (ref 0.0–1.0)

## 2013-01-16 LAB — BASIC METABOLIC PANEL
CO2: 23 mEq/L (ref 19–32)
Calcium: 10.2 mg/dL (ref 8.4–10.5)
Chloride: 98 mEq/L (ref 96–112)
Creatinine, Ser: 0.58 mg/dL (ref 0.50–1.10)
GFR calc Af Amer: >90 mL/min (ref >90)
Sodium: 135 mEq/L (ref 135–145)

## 2013-01-16 LAB — COMPREHENSIVE METABOLIC PANEL
AST: 29 U/L (ref 0–37)
Alkaline Phosphatase: 110 U/L (ref 39–117)
CO2: 22 mEq/L (ref 19–32)
Chloride: 98 mEq/L (ref 96–112)
Creatinine, Ser: 0.58 mg/dL (ref 0.50–1.10)
GFR calc non Af Amer: >90 mL/min (ref >90)
Potassium: 4.4 mEq/L (ref 3.5–5.1)
Total Bilirubin: 0.4 mg/dL (ref 0.3–1.2)

## 2013-01-16 LAB — GLUCOSE, CAPILLARY
Glucose-Capillary: 409 mg/dL — ABNORMAL HIGH (ref 70–99)
Glucose-Capillary: 371 mg/dL — ABNORMAL HIGH (ref 70–99)
Glucose-Capillary: 226 mg/dL — ABNORMAL HIGH (ref 70–99)

## 2013-01-16 LAB — POCT I-STAT, CHEM 8
BUN: 19 mg/dL (ref 6–23)
Calcium, Ion: 1.1 mmol/L — ABNORMAL LOW (ref 1.12–1.23)
Chloride: 103 mEq/L (ref 96–112)
Creatinine, Ser: 0.6 mg/dL (ref 0.50–1.10)
Glucose, Bld: 410 mg/dL — ABNORMAL HIGH (ref 70–99)
TCO2: 24 mmol/L (ref 0–100)

## 2013-01-16 LAB — POCT I-STAT TROPONIN I

## 2013-01-16 MED ORDER — LOTEPREDNOL ETABONATE 0.5 % OP GEL
1.0000 | Freq: Two times a day (BID) | OPHTHALMIC | Status: DC
  Filled 2013-01-16 (×4): qty 1

## 2013-01-16 MED ORDER — INSULIN ASPART 100 UNIT/ML ~~LOC~~ SOLN
0.0000 [IU] | Freq: Three times a day (TID) | SUBCUTANEOUS | Status: DC
  Administered 2013-01-17 – 2013-01-18 (×2): 7 [IU] via SUBCUTANEOUS
  Administered 2013-01-18: 3 [IU] via SUBCUTANEOUS
  Administered 2013-01-18: 4 [IU] via SUBCUTANEOUS
  Administered 2013-01-19: 7 [IU] via SUBCUTANEOUS

## 2013-01-16 MED ORDER — SODIUM CHLORIDE 0.9 % IV SOLN: Freq: Once | INTRAVENOUS | Status: DC

## 2013-01-16 MED ORDER — ACETAMINOPHEN 650 MG RE SUPP: 650.0000 mg | Freq: Four times a day (QID) | RECTAL | Status: DC | PRN

## 2013-01-16 MED ORDER — SERTRALINE HCL 25 MG PO TABS: 25.0000 mg | ORAL_TABLET | Freq: Every day | ORAL | Status: DC

## 2013-01-16 MED ORDER — INSULIN ASPART 100 UNIT/ML FLEXPEN: 20.0000 [IU] | Freq: Three times a day (TID) | SUBCUTANEOUS | Status: DC

## 2013-01-16 MED ORDER — ONDANSETRON HCL 4 MG PO TABS: 4.0000 mg | ORAL_TABLET | Freq: Four times a day (QID) | ORAL | Status: DC | PRN

## 2013-01-16 MED ORDER — INSULIN GLARGINE 100 UNIT/ML ~~LOC~~ SOLN
10.0000 [IU] | Freq: Every day | SUBCUTANEOUS | Status: DC
  Administered 2013-01-16 – 2013-01-18 (×3): 10 [IU] via SUBCUTANEOUS
  Filled 2013-01-16 (×4): qty 0.1

## 2013-01-16 MED ORDER — LISINOPRIL 5 MG PO TABS
5.0000 mg | ORAL_TABLET | Freq: Every day | ORAL | Status: DC
  Administered 2013-01-17 – 2013-01-19 (×3): 5 mg via ORAL
  Filled 2013-01-16 (×3): qty 1

## 2013-01-16 MED ORDER — CIPROFLOXACIN IN D5W 400 MG/200ML IV SOLN
400.0000 mg | Freq: Two times a day (BID) | INTRAVENOUS | Status: DC
  Administered 2013-01-16 – 2013-01-18 (×4): 400 mg via INTRAVENOUS
  Filled 2013-01-16 (×6): qty 200

## 2013-01-16 MED ORDER — INSULIN ASPART 100 UNIT/ML ~~LOC~~ SOLN
20.0000 [IU] | Freq: Three times a day (TID) | SUBCUTANEOUS | Status: DC
  Administered 2013-01-17: 20 [IU] via SUBCUTANEOUS
  Administered 2013-01-18: 10 [IU] via SUBCUTANEOUS

## 2013-01-16 MED ORDER — INSULIN ASPART 100 UNIT/ML ~~LOC~~ SOLN
0.0000 [IU] | Freq: Every day | SUBCUTANEOUS | Status: DC
  Administered 2013-01-16 – 2013-01-17 (×2): 2 [IU] via SUBCUTANEOUS

## 2013-01-16 MED ORDER — ACETAMINOPHEN 325 MG PO TABS
650.0000 mg | ORAL_TABLET | Freq: Four times a day (QID) | ORAL | Status: DC | PRN
  Administered 2013-01-16 – 2013-01-18 (×2): 650 mg via ORAL
  Filled 2013-01-16 (×3): qty 2

## 2013-01-16 MED ORDER — SODIUM CHLORIDE 0.9 % IV SOLN
INTRAVENOUS | Status: DC
  Administered 2013-01-16 – 2013-01-19 (×5): via INTRAVENOUS

## 2013-01-16 MED ORDER — INSULIN ASPART 100 UNIT/ML ~~LOC~~ SOLN
10.0000 [IU] | Freq: Once | SUBCUTANEOUS | Status: AC
  Administered 2013-01-16: 10 [IU] via INTRAVENOUS
  Filled 2013-01-16: qty 1

## 2013-01-16 MED ORDER — SODIUM CHLORIDE 0.9 % IJ SOLN: 3.0000 mL | Freq: Two times a day (BID) | INTRAMUSCULAR | Status: DC

## 2013-01-16 MED ORDER — SODIUM CHLORIDE 0.9 % IV BOLUS (SEPSIS)
500.0000 mL | Freq: Once | INTRAVENOUS | Status: AC
  Administered 2013-01-16: 500 mL via INTRAVENOUS

## 2013-01-16 MED ORDER — DEXTROSE 5 % IV SOLN
1.0000 g | Freq: Three times a day (TID) | INTRAVENOUS | Status: DC
  Administered 2013-01-16 – 2013-01-18 (×6): 1 g via INTRAVENOUS
  Filled 2013-01-16 (×8): qty 1

## 2013-01-16 MED ORDER — ENOXAPARIN SODIUM 40 MG/0.4ML ~~LOC~~ SOLN
40.0000 mg | SUBCUTANEOUS | Status: DC
  Administered 2013-01-16 – 2013-01-18 (×3): 40 mg via SUBCUTANEOUS
  Filled 2013-01-16 (×4): qty 0.4

## 2013-01-16 MED ORDER — ONDANSETRON HCL 4 MG/2ML IJ SOLN
4.0000 mg | Freq: Four times a day (QID) | INTRAMUSCULAR | Status: DC | PRN
  Administered 2013-01-17: 4 mg via INTRAVENOUS
  Filled 2013-01-16: qty 2

## 2013-01-16 NOTE — ED Notes (Addendum)
Elevated Blood sugar, cough with pain on the lt. Rib area. Headache.  Rash on her feet, lower back pain radiates into her buttocks and legs. Denies any injuries, Sister also reports periods of confusion.

## 2013-01-16 NOTE — ED Notes (Signed)
Toileting offered but pt unable to void 

## 2013-01-16 NOTE — Progress Notes (Signed)
ANTIBIOTIC CONSULT NOTE - INITIAL  Pharmacy Consult for cefepime + cipro Indication: UTI  Allergies  Allergen Reactions  . Penicillins Rash    Patient Measurements:   Adjusted Body Weight:   Vital Signs: Temp: 98 F (36.7 C) (07/09 1232) BP: 129/63 mmHg (07/09 1545) Pulse Rate: 83 (07/09 1545) Intake/Output from previous day:   Intake/Output from this shift:    Labs:  Recent Labs  01/16/13 1235 01/16/13 1411 01/16/13 1429 01/16/13 1507  WBC  --   --   --  9.8  HGB  --   --  17.3* 14.8  PLT  --   --   --  238  CREATININE 0.58 0.58 0.60  --    The CrCl is unknown because both a height and weight (above a minimum accepted value) are required for this calculation. No results found for this basename: VANCOTROUGH, Leodis Binet, VANCORANDOM, GENTTROUGH, GENTPEAK, GENTRANDOM, TOBRATROUGH, TOBRAPEAK, TOBRARND, AMIKACINPEAK, AMIKACINTROU, AMIKACIN,  in the last 72 hours   Microbiology: Recent Results (from the past 720 hour(s))  URINE CULTURE     Status: None   Collection Time    12/30/12  7:17 PM      Result Value Range Status   Specimen Description URINE, RANDOM   Final   Special Requests NONE   Final   Culture  Setup Time 12/31/2012 00:18   Final   Colony Count >=100,000 COLONIES/ML   Final   Culture     Final   Value: KLEBSIELLA PNEUMONIAE     Note: Confirmed Extended Spectrum Beta-Lactamase Producer (ESBL)     PSEUDOMONAS AERUGINOSA   Report Status 01/02/2013 FINAL   Final   Organism ID, Bacteria KLEBSIELLA PNEUMONIAE   Final   Organism ID, Bacteria PSEUDOMONAS AERUGINOSA   Final    Medical History: Past Medical History  Diagnosis Date  . Type II or unspecified type diabetes mellitus with unspecified complication, uncontrolled     x ~ 10 years  . Hypertension     x ~ 10 years  . Hypothyroidism   . Chest pain     a. ? MI in 2010, [place]->medically managed  . Diabetic foot ulcer   . Obesity     Medications:  Anti-infectives   Start     Dose/Rate Route  Frequency Ordered Stop   01/16/13 1800  ciprofloxacin (CIPRO) IVPB 400 mg     400 mg 200 mL/hr over 60 Minutes Intravenous Every 12 hours 01/16/13 1704     01/16/13 1800  ceFEPIme (MAXIPIME) 1 g in dextrose 5 % 50 mL IVPB     1 g 100 mL/hr over 30 Minutes Intravenous Every 8 hours 01/16/13 1704       Assessment: 52 yof presented to the ED with AMS and hyperglycemia. Urine culture from 6/22 was positive with kleb pneumo + pseudomonas. Doesn't appear to have been treated. Starting cefepime and cipro today. Pt has good renal function. Noted PCN allergy but has tolerated cephalosporins in the past.   Cefepime 7/9>> Cipro 7/9>>  6/22 Urine - Kleb pneumo + pseudomonas  Goal of Therapy:  Eradication of infection  Plan:  1. Cipro 400mg  IV Q12H 2. Cefepime 1gm IV Q8H 3. F/u renal fxn, C&S, clinical status   Woodrow Drab, Drake Leach 01/16/2013,5:05 PM

## 2013-01-16 NOTE — ED Notes (Signed)
Pt presents today with a c/o high blood sugar, rash on feet, and left rib pain per sister. Pt does not speak english. Per sister pt has also been having some confusion as well.

## 2013-01-16 NOTE — H&P (Signed)
[facility] History and Physical  Debbie [name] ZOX:[id number] DOB: [birth year] DOA: 01/16/2013  Referring physician: er PCP: Standley Dakins, MD  Specialists:   Chief Complaint: high BS and AMS  HPI: Debbie [name] is a 52 y.o. female  Who is brought in by her sister with high BS and confusion.  No fever, no chills, no rashes, no HA, no nucal rigidity.  Patient was in the hospital on June 21-June 25, she was worked up at that time to have syncope.  U/A was not impressive at that time but Cultures of her urine grew out pseudomonas and klebsiella > 100,000: sister admits to a foul odor from her urine.  +pain with urination The culture does not appear to have been treated.    - patient thinks she is in [place] and has been talking to her dead father- will not speak to me, rather speaks with her sister- would not use phone translator Patient has had issues with controlling BS for years   In the ER, her BS was > 400. CT scan showed chronic changes, U/A had not been done yet- I/O cath ordered  Review of Systems: unable to do full ROS due to AMS    Past Medical History  Diagnosis Date  . Type II or unspecified type diabetes mellitus with unspecified complication, uncontrolled     x ~ 10 years  . Hypertension     x ~ 10 years  . Hypothyroidism   . Chest pain     a. ? MI in 2010, [place]->medically managed  . Diabetic foot ulcer   . Obesity    Past Surgical History  Procedure Laterality Date  . Back surgery     Social History:  reports that she has never smoked. She does not have any smokeless tobacco history on file. She reports that she does not drink alcohol or use illicit drugs. Lives with sister  Allergies  Allergen Reactions  . Penicillins Rash    Family History  Problem Relation Age of Onset  . Diabetes Father     died in his 48's?  . Diabetes Father     died in her 8's?  . Diabetes Sister     alive and well.    Prior to Admission medications   Medication  Sig Start Date End Date Taking? Authorizing Provider  acetaminophen (TYLENOL) 325 MG tablet Take 2 tablets (650 mg total) by mouth every 6 (six) hours as needed. 01/02/13  Yes Rhetta Mura, MD  glimepiride (AMARYL) 4 MG tablet Take 1 tablet (4 mg total) by mouth 2 (two) times daily before a meal. 01/09/13  Yes Dorothea Ogle, MD  insulin aspart (NOVOLOG) 100 unit/ml SOLN Inject 20 Units into the skin 3 (three) times daily with meals. 01/09/13  Yes Dorothea Ogle, MD  lisinopril (PRINIVIL,ZESTRIL) 5 MG tablet Take 1 tablet (5 mg total) by mouth daily. 01/09/13  Yes Dorothea Ogle, MD  Loteprednol Etabonate 0.5 % GEL Apply 1 Container to eye 2 (two) times daily. 01/09/13  Yes Dorothea Ogle, MD  neomycin-polymyxin-pramoxine (NEOSPORIN PLUS) 1 % cream Apply 1 application topically 2 (two) times daily. 12/05/12  Yes Ripudeep Jenna Luo, MD  saxagliptin HCl (ONGLYZA) 5 MG TABS tablet Take 10 mg by mouth 2 (two) times daily.   Yes Historical Provider, MD  traMADol (ULTRAM) 50 MG tablet Take 1 tablet (50 mg total) by mouth every 8 (eight) hours as needed for pain. 12/05/12  Yes Ripudeep Jenna Luo, MD  aspirin  81 MG tablet Take 81 mg by mouth daily.    Historical Provider, MD  Insulin Pen Needle (COMFORT EZ PEN NEEDLES) 32G X 4 MM MISC 1 Container by Does not apply route 3 (three) times daily before meals. 01/09/13   Dorothea Ogle, MD  sertraline (ZOLOFT) 25 MG tablet Take 1 tablet (25 mg total) by mouth daily. 01/09/13   Dorothea Ogle, MD   Physical Exam: Filed Vitals:   01/16/13 1415 01/16/13 1430 01/16/13 1445 01/16/13 1500  BP: 139/68 138/72 142/60 132/64  Pulse:   82 77  Temp:      Resp: 41 19 45 17  SpO2:   92% 96%     General:  Laying in bed wearing dark glasses  Eyes: covered by glasses, did not want them removed  ENT: dry mucous membranes  Neck: supple  Cardiovascular: rrr  Respiratory: clear  Abdomen: +BS, firm but not tender  Skin: diabetic ulcers on B/L feet-few digits- sister says  better  Musculoskeletal: moves all 4 ext; right arm has a temor  Psychiatric: unable to assess- would not speak to me- spoke with sister  Neurologic: no focal deficit  Labs on Admission:  Basic Metabolic Panel:  Recent Labs Lab 01/16/13 1235 01/16/13 1411 01/16/13 1429  NA 135 134* 136  K 4.6 4.4 4.5  CL 98 98 103  CO2 23 22  --   GLUCOSE 422* 389* 410*  BUN 17 18 19   CREATININE 0.58 0.58 0.60  CALCIUM 10.2 10.1  --    Liver Function Tests:  Recent Labs Lab 01/16/13 1411  AST 29  ALT 45*  ALKPHOS 110  BILITOT 0.4  PROT 7.6  ALBUMIN 3.7   No results found for this basename: LIPASE, AMYLASE,  in the last 168 hours No results found for this basename: AMMONIA,  in the last 168 hours CBC:  Recent Labs Lab 01/16/13 1429 01/16/13 1507  WBC  --  9.8  NEUTROABS  --  5.6  HGB 17.3* 14.8  HCT 51.0* 43.0  MCV  --  79.3  PLT  --  238   Cardiac Enzymes: No results found for this basename: CKTOTAL, CKMB, CKMBINDEX, TROPONINI,  in the last 168 hours  BNP (last 3 results)  Recent Labs  11/12/12 0545  PROBNP 113.9   CBG:  Recent Labs Lab 01/16/13 1228 01/16/13 1355  GLUCAP 409* 371*    Radiological Exams on Admission: Ct Head Wo Contrast  01/16/2013   *RADIOLOGY REPORT*  Clinical Data: 52 year old female hyperglycemia, altered mental status, headache, eye pain.  CT HEAD WITHOUT CONTRAST  Technique:  Contiguous axial images were obtained from the base of the skull through the vertex without contrast.  Comparison: Brain MRI 12/31/2012 and earlier.  Findings: Visualized paranasal sinuses and mastoids are clear. Visualized orbits and scalp soft tissues are within normal limits. No acute osseous abnormality identified.  No midline shift, mass effect, or evidence of mass lesion.  No ventriculomegaly.  Stable cerebral volume.  Patchy cerebral white matter hypodensity appears stable from the recent MRI. No acute intracranial hemorrhage identified.  No suspicious  intracranial vascular hyperdensity. No evidence of cortically based acute infarction identified.  IMPRESSION: No acute intracranial abnormality.  Stable nonspecific white matter changes.   Original Report Authenticated By: Erskine Speed, M.D.   Dg Chest Port 1 View  01/16/2013   *RADIOLOGY REPORT*  Clinical Data: Chest pain, shortness of breath, cough, diabetes, hypertension, altered mental status  PORTABLE CHEST - 1 VIEW  Comparison: Portable exam 1443 hours compared to 11/09/2012  Findings: Upper-normal size of cardiac silhouette. Mediastinal contours and pulmonary vascularity normal. Minimal right basilar atelectasis. Lungs otherwise clear. No pleural effusion or pneumothorax. Bones unremarkable.  IMPRESSION: Minimal right basilar atelectasis.   Original Report Authenticated By: Ulyses Southward, M.D.    EKG: Independently reviewed. sinus  Assessment/Plan Active Problems:   Diabetes mellitus type 2 with peripheral artery disease   Adjustment disorder with mixed anxiety and depressed mood   Unspecified hypothyroidism   Diabetic foot ulcers   Altered mental status   1. Hyperglycemia due to DM- add lantus- hold orals, check HgbA1C, continue with meal and SSI- ? Elevated related to infection? 2. AMS prob due to UTI- last culture done in hospital showed Pseudomonas and Kleb- does not appear to be treated- add cipro and cefepime per culture sensitivites 3. Diabetic foot ulcers 4. Hypothyroid- check TSH 5. Depression- was started on zoloft by psych previous hospitalizations, ?consider increase to 50 mg- ? Underlying psych disorder- language barrier and AMS make it difficult to diagnose    Code Status: full Family Communication: patient and sister at bedside Disposition Plan: admit  Time spent: 75 min  Marlin Canary [facility] Pager [phone]  If 7PM-7AM, please contact night-coverage [email or web address] Password [email or web address] 01/16/2013, 4:50 PM

## 2013-01-16 NOTE — ED Provider Notes (Signed)
History    CSN: [id number] Arrival date & time 01/16/13  1222  None    Chief Complaint  Patient presents with  . Hyperglycemia   (Consider location/radiation/quality/duration/timing/severity/associated sxs/prior Treatment) HPI Comments: The patient is a 52 year old woman with diabetes mellitus who had had high blood sugar noted, and has had altered mental status since yesterday. Her sister says that she thinks she is in the ear on, and that her father who is dead is coming home to visit her.  Patient is a 52 y.o. female presenting with altered mental status. The history is provided by medical records (The sister). The history is limited by a language barrier and the condition of the patient. A language interpreter was used (Pt's sister gave her history.).  Altered Mental Status Presenting symptoms: behavior changes, confusion and disorientation   Severity:  Severe Most recent episode:  Yesterday Duration:  1 day Timing:  Constant Progression:  Unchanged Chronicity:  Recurrent Context: recent illness (recent hospitalization for diabetes.)   Visual change: she is wearing dark glasses because of diabetic retinal changes.   Associated symptoms comment:  Hyperglycemia.  Past Medical History  Diagnosis Date  . Type II or unspecified type diabetes mellitus with unspecified complication, uncontrolled     x ~ 10 years  . Hypertension     x ~ 10 years  . Hypothyroidism   . Chest pain     a. ? MI in 2010, [place]->medically managed  . Diabetic foot ulcer   . Obesity    Past Surgical History  Procedure Laterality Date  . Back surgery     Family History  Problem Relation Age of Onset  . Diabetes Father     died in his 65's?  . Diabetes Father     died in her 42's?  . Diabetes Sister     alive and well.   History  Substance Use Topics  . Smoking status: Never Smoker   . Smokeless tobacco: Not on file  . Alcohol Use: No   OB History   Grav Para Term Preterm Abortions TAB  SAB Ect Mult Living                 Review of Systems  Unable to perform ROS: Mental status change  Psychiatric/Behavioral: Positive for confusion and altered mental status.    Allergies  Penicillins  Home Medications   Current Outpatient Rx  Name  Route  Sig  Dispense  Refill  . acetaminophen (TYLENOL) 325 MG tablet   Oral   Take 2 tablets (650 mg total) by mouth every 6 (six) hours as needed.         Marland Kitchen glimepiride (AMARYL) 4 MG tablet   Oral   Take 1 tablet (4 mg total) by mouth 2 (two) times daily before a meal.   30 tablet   5   . insulin aspart (NOVOLOG) 100 unit/ml SOLN   Subcutaneous   Inject 20 Units into the skin 3 (three) times daily with meals.   1 pen   5   . lisinopril (PRINIVIL,ZESTRIL) 5 MG tablet   Oral   Take 1 tablet (5 mg total) by mouth daily.   30 tablet   5   . Loteprednol Etabonate 0.5 % GEL   Ophthalmic   Apply 1 Container to eye 2 (two) times daily.   1 Bottle   3   . neomycin-polymyxin-pramoxine (NEOSPORIN PLUS) 1 % cream   Topical   Apply 1 application topically 2 (  two) times daily.         . saxagliptin HCl (ONGLYZA) 5 MG TABS tablet   Oral   Take 10 mg by mouth 2 (two) times daily.         . traMADol (ULTRAM) 50 MG tablet   Oral   Take 1 tablet (50 mg total) by mouth every 8 (eight) hours as needed for pain.   60 tablet   1   . aspirin 81 MG tablet   Oral   Take 81 mg by mouth daily.         . Insulin Pen Needle (COMFORT EZ PEN NEEDLES) 32G X 4 MM MISC   Does not apply   1 Container by Does not apply route 3 (three) times daily before meals.   1 each   11   . sertraline (ZOLOFT) 25 MG tablet   Oral   Take 1 tablet (25 mg total) by mouth daily.   30 tablet   5    BP 138/72  Pulse 100  Temp(Src) 98 F (36.7 C)  Resp 19  SpO2 98% Physical Exam  Nursing note and vitals reviewed. Constitutional: She appears well-developed and well-nourished.  Wearing dark glasses, has right arm tremor which the sister  says is a chronic issue.  HENT:  Right Ear: External ear normal.  Left Ear: External ear normal.  Mouth/Throat: Oropharynx is clear and moist.  Eyes: Conjunctivae and EOM are normal. Pupils are equal, round, and reactive to light.  Neck: Normal range of motion. Neck supple.  Cardiovascular: Normal rate, regular rhythm and normal heart sounds.   Pulmonary/Chest: Effort normal and breath sounds normal.  Occasionally has tachypnea.  Abdominal: Soft. Bowel sounds are normal.  Musculoskeletal: Normal range of motion.  Has superficial ulcers on her left little toe and right second toe on the dorsum.  Neurological:  Patient does not speak, though this may be because of the language barrier. Her sensory and motor seem intact.  Skin: Skin is warm and dry.  Psychiatric:  Unable to assess.    ED Course  Procedures (including critical care time) Labs Reviewed  GLUCOSE, CAPILLARY - Abnormal; Notable for the following:    Glucose-Capillary 409 (*)    All other components within normal limits  BASIC METABOLIC PANEL - Abnormal; Notable for the following:    Glucose, Bld 422 (*)    All other components within normal limits  GLUCOSE, CAPILLARY - Abnormal; Notable for the following:    Glucose-Capillary 371 (*)    All other components within normal limits  POCT I-STAT, CHEM 8 - Abnormal; Notable for the following:    Glucose, Bld 410 (*)    Calcium, Ion 1.10 (*)    Hemoglobin 17.3 (*)    HCT 51.0 (*)    All other components within normal limits  CBC WITH DIFFERENTIAL  CBC WITH DIFFERENTIAL  COMPREHENSIVE METABOLIC PANEL  URINALYSIS, ROUTINE W REFLEX MICROSCOPIC  POCT I-STAT TROPONIN I    2:51 PM  Date: 01/16/2013  Rate: 91  Rhythm: normal sinus rhythm  QRS Axis: left  Intervals: normal QRS:  Poor R wave progression in precordial leads suggests old anterior myocardial infarction  ST/T Wave abnormalities: normal  Conduction Disutrbances:left anterior fascicular block  Narrative  Interpretation: Abnormal EKG  Old EKG Reviewed: unchanged  3:45 PM Results for orders placed during the hospital encounter of 01/16/13  GLUCOSE, CAPILLARY      Result Value Range   Glucose-Capillary 409 (*) 70 - 99 mg/dL  BASIC METABOLIC PANEL      Result Value Range   Sodium 135  135 - 145 mEq/L   Potassium 4.6  3.5 - 5.1 mEq/L   Chloride 98  96 - 112 mEq/L   CO2 23  19 - 32 mEq/L   Glucose, Bld 422 (*) 70 - 99 mg/dL   BUN 17  6 - 23 mg/dL   Creatinine, Ser 3.24  0.50 - 1.10 mg/dL   Calcium 40.1  8.4 - 02.7 mg/dL   GFR calc non Af Amer >90  >90 mL/min   GFR calc Af Amer >90  >90 mL/min  COMPREHENSIVE METABOLIC PANEL      Result Value Range   Sodium 134 (*) 135 - 145 mEq/L   Potassium 4.4  3.5 - 5.1 mEq/L   Chloride 98  96 - 112 mEq/L   CO2 22  19 - 32 mEq/L   Glucose, Bld 389 (*) 70 - 99 mg/dL   BUN 18  6 - 23 mg/dL   Creatinine, Ser 2.53  0.50 - 1.10 mg/dL   Calcium 66.4  8.4 - 40.3 mg/dL   Total Protein 7.6  6.0 - 8.3 g/dL   Albumin 3.7  3.5 - 5.2 g/dL   AST 29  0 - 37 U/L   ALT 45 (*) 0 - 35 U/L   Alkaline Phosphatase 110  39 - 117 U/L   Total Bilirubin 0.4  0.3 - 1.2 mg/dL   GFR calc non Af Amer >90  >90 mL/min   GFR calc Af Amer >90  >90 mL/min  GLUCOSE, CAPILLARY      Result Value Range   Glucose-Capillary 371 (*) 70 - 99 mg/dL  POCT I-STAT TROPONIN I      Result Value Range   Troponin i, poc 0.00  0.00 - 0.08 ng/mL   Comment 3           POCT I-STAT, CHEM 8      Result Value Range   Sodium 136  135 - 145 mEq/L   Potassium 4.5  3.5 - 5.1 mEq/L   Chloride 103  96 - 112 mEq/L   BUN 19  6 - 23 mg/dL   Creatinine, Ser 4.74  0.50 - 1.10 mg/dL   Glucose, Bld 259 (*) 70 - 99 mg/dL   Calcium, Ion 5.63 (*) 1.12 - 1.23 mmol/L   TCO2 24  0 - 100 mmol/L   Hemoglobin 17.3 (*) 12.0 - 15.0 g/dL   HCT 87.5 (*) 64.3 - 32.9 %     Dg Chest Port 1 View  01/16/2013   *RADIOLOGY REPORT*  Clinical Data: Chest pain, shortness of breath, cough, diabetes, hypertension, altered  mental status  PORTABLE CHEST - 1 VIEW  Comparison: Portable exam 1443 hours compared to 11/09/2012  Findings: Upper-normal size of cardiac silhouette. Mediastinal contours and pulmonary vascularity normal. Minimal right basilar atelectasis. Lungs otherwise clear. No pleural effusion or pneumothorax. Bones unremarkable.  IMPRESSION: Minimal right basilar atelectasis.   Original Report Authenticated By: Ulyses Southward, M.D.   Course in the ED:  Patient was seen and had physical examination. Laboratory tests were ordered. IV fluids were ordered. Blood sugar was elevated around 410, but the CO2 was normal, so she was not in diabetic ketoacidosis. IV fluids were increased, and IV insulin was ordered. [facility] will be called to admit her for altered mental status and hyperglycemia.  Case discussed with Dr. Benjamine Mola, [facility], who will see pt.  She will decide what  bed status to place pt in based on her evaluation.  4:54 PM CT of head shows no acute abnormality.  1. Altered mental status   2. Hyperglycemia          Carleene Cooper III, MD 01/16/13 1655

## 2013-01-16 NOTE — Progress Notes (Signed)
Patient transferred from ED via stretcher to room 4736. Vital signs are stable. Sister of patient is at bedside and is able to translate information to patient in her language. Family and patient oriented to room and call bell system. Report given to oncoming nurse. Sister not able to find Eye drops/ gel at home, therefore notified pharmacy to order eye drops/gel for the patient. Nurse signing off at this time

## 2013-01-17 ENCOUNTER — Inpatient Hospital Stay (HOSPITAL_COMMUNITY): Payer: Medicaid Other

## 2013-01-17 ENCOUNTER — Encounter (HOSPITAL_COMMUNITY): Payer: Self-pay | Admitting: Radiology

## 2013-01-17 DIAGNOSIS — R42 Dizziness and giddiness: Secondary | ICD-10-CM

## 2013-01-17 DIAGNOSIS — I10 Essential (primary) hypertension: Secondary | ICD-10-CM

## 2013-01-17 DIAGNOSIS — R7309 Other abnormal glucose: Secondary | ICD-10-CM

## 2013-01-17 LAB — HEMOGLOBIN A1C
Hgb A1c MFr Bld: 11.5 % — ABNORMAL HIGH (ref <5.7)
Mean Plasma Glucose: 283 mg/dL — ABNORMAL HIGH (ref <117)

## 2013-01-17 LAB — GLUCOSE, CAPILLARY
Glucose-Capillary: 236 mg/dL — ABNORMAL HIGH (ref 70–99)
Glucose-Capillary: 116 mg/dL — ABNORMAL HIGH (ref 70–99)
Glucose-Capillary: 227 mg/dL — ABNORMAL HIGH (ref 70–99)

## 2013-01-17 LAB — TROPONIN I: Troponin I: <0.3 ng/mL (ref <0.30)

## 2013-01-17 LAB — URINE CULTURE: Colony Count: NO GROWTH

## 2013-01-17 LAB — TSH: TSH: 3.912 u[IU]/mL (ref 0.350–4.500)

## 2013-01-17 MED ORDER — IOHEXOL 300 MG/ML  SOLN
25.0000 mL | INTRAMUSCULAR | Status: AC
  Administered 2013-01-17: 25 mL via ORAL

## 2013-01-17 MED ORDER — IOHEXOL 300 MG/ML  SOLN
100.0000 mL | Freq: Once | INTRAMUSCULAR | Status: AC | PRN
  Administered 2013-01-17: 100 mL via INTRAVENOUS

## 2013-01-17 MED ORDER — LOTEPREDNOL ETABONATE 0.5 % OP SUSP
1.0000 [drp] | Freq: Two times a day (BID) | OPHTHALMIC | Status: DC
  Administered 2013-01-17 – 2013-01-19 (×4): 1 [drp] via OPHTHALMIC
  Filled 2013-01-17: qty 5

## 2013-01-17 MED ORDER — SERTRALINE HCL 50 MG PO TABS
50.0000 mg | ORAL_TABLET | Freq: Every day | ORAL | Status: DC
  Administered 2013-01-17 – 2013-01-19 (×3): 50 mg via ORAL
  Filled 2013-01-17 (×4): qty 1

## 2013-01-17 NOTE — Progress Notes (Signed)
UR Completed.  Debbie [name] Jane [phone] 01/17/2013  

## 2013-01-17 NOTE — Progress Notes (Signed)
The interpreter phones were used with the patient this morning.  The patient was not even oriented to person and kept stating her sister's name.  Other than orientation, there were not any acute changes with the patient overnight.

## 2013-01-17 NOTE — Progress Notes (Signed)
[facility]  Patient Status: Active (receiving services up to time of hospitalization)  [facility] is providing the following services: RN  If patient discharges after hours, please call [phone].   Debbie [name] 01/17/2013, 10:00 AM

## 2013-01-17 NOTE — Progress Notes (Signed)
MD made aware that client is not eating and blood sugar is 112.  Instructed to hold 20 units Insulin that is ordered at this time.  Refuses to drink  Medium for CT scan.  CT notified.

## 2013-01-17 NOTE — Progress Notes (Addendum)
[facility] PROGRESS NOTE  Debbie [name] ZOX:[id number] DOB: [birth year] DOA: 01/16/2013 PCP: Standley Dakins, MD  Assessment/Plan: Hyperglycemia Uncontrolled diabetes no Diabetes mellitus type 2 with peripheral artery disease  -Better blood glucose control today, but her by mouth intake is minimal -Continue Lantus for now follow and add on oral meds, her hemoglobin A1c is markedly elevated at 11.5, she'll need to stay on the Lantus on d/c Adjustment disorder with mixed anxiety and depressed mood  -Will resume Zoloft, increase dose to 50 mg and follow Unspecified hypothyroidism  -On no meds, TSH within normal Altered mental status/acute metabolic encephalopathy -Possibly secondary to#1 -much Clinically improved per family ? UTI -Urinalysis is not consistent with a UTI, will continue current antibiotics pending urine culture LLQ abd pain/tenderness -Will obtain CT scan of abdomen and pelvis to further evaluate and follow  Code Status: full Family Communication: Her sister at bedside and congregational nurse who follows her outpt Disposition Plan: to home when medically stable   Consultants:  none  Procedures:  none  Antibiotics:  Cefepime and cipro  HPI/Subjective: Patient is non-English-speaking, her sister at bedside is interpreting. She is alert and oriented x3, complaining of left lower abdominal pain. Denies nausea/vomiting and no diarrhea.  Objective: Filed Vitals:   01/17/13 0007 01/17/13 0515 01/17/13 0700 01/17/13 1048  BP: 122/78 122/75  122/54  Pulse: 84 88    Temp:  97.9 F (36.6 C)    TempSrc:  Oral    Resp: 18 16    Height:      Weight:  99.61 kg (219 lb 9.6 oz) 98.612 kg (217 lb 6.4 oz)   SpO2: 99% 98%      Intake/Output Summary (Last 24 hours) at 01/17/13 1117 Last data filed at 01/17/13 1100  Gross per 24 hour  Intake    360 ml  Output    125 ml  Net    235 ml   Filed Weights   01/16/13 1842 01/17/13 0515 01/17/13 0700  Weight: 98.521  kg (217 lb 3.2 oz) 99.61 kg (219 lb 9.6 oz) 98.612 kg (217 lb 6.4 oz)    Exam:  General: alert & oriented x 3, In NAD Cardiovascular: RRR, nl S1 s2 Respiratory: CTAB Abdomen: soft +BS , left lower quadrant tenderness, no rebound, no masses palpable, nondistended. Extremities: No cyanosis and no edema    Data Reviewed: Basic Metabolic Panel:  Recent Labs Lab 01/16/13 1235 01/16/13 1411 01/16/13 1429  NA 135 134* 136  K 4.6 4.4 4.5  CL 98 98 103  CO2 23 22  --   GLUCOSE 422* 389* 410*  BUN 17 18 19   CREATININE 0.58 0.58 0.60  CALCIUM 10.2 10.1  --    Liver Function Tests:  Recent Labs Lab 01/16/13 1411  AST 29  ALT 45*  ALKPHOS 110  BILITOT 0.4  PROT 7.6  ALBUMIN 3.7   No results found for this basename: LIPASE, AMYLASE,  in the last 168 hours No results found for this basename: AMMONIA,  in the last 168 hours CBC:  Recent Labs Lab 01/16/13 1429 01/16/13 1507  WBC  --  9.8  NEUTROABS  --  5.6  HGB 17.3* 14.8  HCT 51.0* 43.0  MCV  --  79.3  PLT  --  238   Cardiac Enzymes:  Recent Labs Lab 01/16/13 2345 01/17/13 0635  TROPONINI <0.30 <0.30   BNP (last 3 results)  Recent Labs  11/12/12 0545  PROBNP 113.9   CBG:  Recent Labs Lab  01/16/13 1228 01/16/13 1355 01/16/13 1704 01/16/13 2059 01/17/13 0616  GLUCAP 409* 371* 226* 202* 236*    No results found for this or any previous visit (from the past 240 hour(s)).   Studies: Ct Head Wo Contrast  01/16/2013   *RADIOLOGY REPORT*  Clinical Data: 52 year old female hyperglycemia, altered mental status, headache, eye pain.  CT HEAD WITHOUT CONTRAST  Technique:  Contiguous axial images were obtained from the base of the skull through the vertex without contrast.  Comparison: Brain MRI 12/31/2012 and earlier.  Findings: Visualized paranasal sinuses and mastoids are clear. Visualized orbits and scalp soft tissues are within normal limits. No acute osseous abnormality identified.  No midline shift,  mass effect, or evidence of mass lesion.  No ventriculomegaly.  Stable cerebral volume.  Patchy cerebral white matter hypodensity appears stable from the recent MRI. No acute intracranial hemorrhage identified.  No suspicious intracranial vascular hyperdensity. No evidence of cortically based acute infarction identified.  IMPRESSION: No acute intracranial abnormality.  Stable nonspecific white matter changes.   Original Report Authenticated By: Erskine Speed, M.D.   Dg Chest Port 1 View  01/16/2013   *RADIOLOGY REPORT*  Clinical Data: Chest pain, shortness of breath, cough, diabetes, hypertension, altered mental status  PORTABLE CHEST - 1 VIEW  Comparison: Portable exam 1443 hours compared to 11/09/2012  Findings: Upper-normal size of cardiac silhouette. Mediastinal contours and pulmonary vascularity normal. Minimal right basilar atelectasis. Lungs otherwise clear. No pleural effusion or pneumothorax. Bones unremarkable.  IMPRESSION: Minimal right basilar atelectasis.   Original Report Authenticated By: Ulyses Southward, M.D.    Scheduled Meds: . ceFEPime (MAXIPIME) IV  1 g Intravenous Q8H  . ciprofloxacin  400 mg Intravenous Q12H  . enoxaparin (LOVENOX) injection  40 mg Subcutaneous Q24H  . insulin aspart  0-20 Units Subcutaneous TID WC  . insulin aspart  0-5 Units Subcutaneous QHS  . insulin aspart  20 Units Subcutaneous TID WC  . insulin glargine  10 Units Subcutaneous QHS  . lisinopril  5 mg Oral Daily  . Loteprednol Etabonate  1 Container Ophthalmic BID  . sodium chloride  3 mL Intravenous Q12H   Continuous Infusions: . sodium chloride 125 mL/hr at 01/16/13 1844    Active Problems:   Diabetes mellitus type 2 with peripheral artery disease   Adjustment disorder with mixed anxiety and depressed mood   Unspecified hypothyroidism   Diabetic foot ulcers   Altered mental status    Time spent: >35    [name]  [facility] Pager [phone]. If 7PM-7AM, please contact  night-coverage at [email or web address], password [facility] 01/17/2013, 11:17 AM  LOS: 1 day

## 2013-01-18 LAB — BASIC METABOLIC PANEL
BUN: 12 mg/dL (ref 6–23)
CO2: 24 mEq/L (ref 19–32)
Chloride: 106 mEq/L (ref 96–112)
Glucose, Bld: 201 mg/dL — ABNORMAL HIGH (ref 70–99)
Potassium: 3.7 mEq/L (ref 3.5–5.1)

## 2013-01-18 LAB — GLUCOSE, CAPILLARY: Glucose-Capillary: 191 mg/dL — ABNORMAL HIGH (ref 70–99)

## 2013-01-18 NOTE — Evaluation (Signed)
Occupational Therapy Evaluation Patient Details Name: Debbie [name] MRN: [id number] DOB: Nov 21, 1960 Today's Date: 01/18/2013 Time: 1191-4782 OT Time Calculation (min): 22 min  OT Assessment / Plan / Recommendation History of present illness Debbie [name] is an 52 y.o. female with hx of HTN, DM, hypothyroidism, Obesity, presents to the ER with syncope.  She was at a friend's party, and not yet had any meals, had sudden loss of consicousness for about 10 minutes, with no seizure activity, palpitation, chest pain or SOB   Clinical Impression   Pt admitted with above. Pt currently with functional limitations due to the deficits listed below (see OT Problem List).  Pt will benefit from skilled OT to increase their safety and independence with functional mobility for ADL and for family education to facilitate discharge to venue listed below.       OT Assessment  Patient needs continued OT Services    Follow Up Recommendations  Home health OT (family wants to take her home)             Frequency  Min 2X/week    Precautions / Restrictions Precautions Precautions: Fall Precaution Comments: Pt very high fall risk.  has fallen in past. Restrictions Weight Bearing Restrictions: No   Pertinent Vitals/Pain Left hip with rolling to the right; left knee or hip with I tried to help her bend her knee while she was seated EOB. Repositioned her back in bed supine.    ADL  ADL Comments: Pt as of today is total A for all BADLs (per sister in room who is one of her primary caregivers they do most of her ADLs at home). Spoke to nursing and nurse tech about hoyering pt OOB since we could not safely get her up with +2 A due to pain and pt going into extensor posture and resisting movements. Both nurse and nurse tech report that earlier this morning they got the pt up to the [facility] and pt did not c/o pain, resist, or go into an extensor pattern. Not sure as to why the great discrepancy.    OT Diagnosis:  Generalized weakness;Cognitive deficits;Disturbance of vision;Acute pain  OT Problem List: Decreased strength;Decreased range of motion;Decreased activity tolerance;Impaired balance (sitting and/or standing);Pain;Decreased safety awareness;Decreased knowledge of use of DME or AE;Decreased cognition;Impaired vision/perception OT Treatment Interventions: Self-care/ADL training;Therapeutic activities;DME and/or AE instruction;Patient/family education;Balance training   OT Goals(Current goals can be found in the care plan section) Acute Rehab OT Goals Patient Stated Goal: did not set OT Goal Formulation: With patient/family Time For Goal Achievement: 02/01/13 Potential to Achieve Goals: Fair ADL Goals Pt Will Perform Grooming: with mod assist;sitting (supported, one task) Pt Will Transfer to Toilet: with +2 assist;squat pivot transfer;stand pivot transfer;bedside commode (pt=50%) Additional ADL Goal #1: Pt will roll right and left to A with BADls with mod A, HOB flat, and use of rail Additional ADL Goal #2: Pt will come up to sit with Mod A in prep for BADLs and/or transfers  Visit Information  Last OT Received On: 01/18/13 Assistance Needed: +2 History of Present Illness: Debbie [name] is an 52 y.o. female with hx of HTN, DM, hypothyroidism, Obesity, presents to the ER with syncope.  She was at a friend's party, and not yet had any meals, had sudden loss of consicousness for about 10 minutes, with no seizure activity, palpitation, chest pain or SOB       Prior Functioning     Home Living Family/patient expects to be discharged to:: Private residence Living Arrangements:  Other relatives Available Help at Discharge: Available 24 hours/day;Family Type of Home: Apartment Home Access: Level entry Home Layout: Two level Home Equipment: Walker - 2 wheels;Bedside commode;Wheelchair - manual  Lives With: Family Prior Function Level of Independence: Needs assistance Gait / Transfers Assistance  Needed: (A) needed at times with small distance with use of RW.  Two person assistance on pt's worse days of fatigue ADL's / Homemaking Assistance Needed: (A) to complete all ADLs Communication Communication: Prefers language other than English Dominant Hand: Right         Vision/Perception Vision - History Visual History: Retinopathy;Other (comment) Patient Visual Report: Other (comment)   Cognition  Cognition Arousal/Alertness: Awake/alert Behavior During Therapy: Flat affect Overall Cognitive Status: Difficult to assess (language barrier however sister reports improved orientation) General Comments: Pt with min verbalization to sister during session.  Pt sister translated during session. Difficult to assess due to: Non-English speaking    Extremity/Trunk Assessment Upper Extremity Assessment Upper Extremity Assessment: Difficult to assess due to impaired cognition (tremors noted BUEs) Lower Extremity Assessment Lower Extremity Assessment: RLE deficits/detail;LLE deficits/detail RLE Deficits / Details: Pt's would keep bilateral LE into extension sitting EOB and unable to assess due to pt resisting movement and unable to bend knees RLE: Unable to fully assess due to pain RLE Sensation: history of peripheral neuropathy LLE Deficits / Details: Pt's would keep bilateral LE into extension sitting EOB and unable to assess due to pt resisting movement and unable to bend knees LLE: Unable to fully assess due to pain LLE Sensation: history of peripheral neuropathy     Mobility Bed Mobility Bed Mobility: Rolling Right;Right Sidelying to Sit;Sit to Supine Rolling Right: 1: +2 Total assist Rolling Right: Patient Percentage: 0% Right Sidelying to Sit: 1: +2 Total assist Right Sidelying to Sit: Patient Percentage: 0% Sit to Supine: 1: +2 Total assist Sit to Supine: Patient Percentage: 0% Details for Bed Mobility Assistance: Pt resisting movement at times and continued to keep knees  into extension and when attempting to flex knees pt's trunk would go into extension.          Balance Balance Balance Assessed: Yes Static Sitting Balance Static Sitting - Balance Support: Feet unsupported;Left upper extremity supported Static Sitting - Level of Assistance: 1: +1 Total assist Static Sitting - Comment/# of Minutes: ~2 mintues prior to pt going into trunk extension and unsafe in upright posture.  Pt resisting movement at times.  When attempting flex bilateral knees to assist with sitting balance pt would go into extension making it unsafe to sit EOB.  Pt heavy posterior and left sided lean.   End of Session OT - End of Session Activity Tolerance: Patient limited by pain Patient left: in bed;with call bell/phone within reach;with family/visitor present Nurse Communication: Need for lift equipment       Evette Georges [phone]  01/18/2013, 11:50 AM

## 2013-01-18 NOTE — Consult Note (Signed)
WOC consult Note Reason for Consult: evaluation of toe ulcer.  Pt with unstable DM, from [place].  Caregiver at the bedside interprets while I am doing my assessment. Pt with significant neuropathy per the caregiver.  When I touch the legs or toes she jumps and jerks away, but does not speak. Only grunts. Pt with palpable PT pulses, harder to assess the DP's, feet are warm to the touch. No color changes of the LE, except the 4th toe on the right foot is a little darker. Wound type: Neuropathic ulcer 5th toe of the left toe, darkness of the 4th toe on the right. All of the foot and toes are tender to the touch. Wound bed: left 5th toe, stable small eschar Drainage (amount, consistency, odor) none Periwound:intact Dressing procedure/placement/frequency: best practice since stable and non draining would be to leave these areas open to air. No topical care needed at this time.  If wounds change or progress would suggest ABI's to determine vascular status.   Re consult if needed, will not follow at this time. Thanks  Bijan Ridgley [employer], CWOCN [phone])

## 2013-01-18 NOTE — Evaluation (Signed)
Physical Therapy Evaluation Patient Details Name: Debbie [name] MRN: [id number] DOB: 27-May-1961 Today's Date: 01/18/2013 Time: 0936-1000 PT Time Calculation (min): 24 min  PT Assessment / Plan / Recommendation History of Present Illness  Debbie [name] is an 52 y.o. female with hx of HTN, DM, hypothyroidism, Obesity, presents to the ER with syncope.  She was at a friend's party, and not yet had any meals, had sudden loss of consicousness for about 10 minutes, with no seizure activity, palpitation, chest pain or SOB  Clinical Impression  Limited evaluation due to pain in LEs per sister.  Difficult to assess cognition due to language barrier.  Prior to PT/OT evaluation RN staff reported pt getting to 3n1 and back (2 person assist from bed > 3n1 and one person assist from 3n1 > bed.)  Pt also able to take side steps to [facility] with RN staff however no family present.  With family present during PT/OT evaluation,  pt very limited due to pain and unable to sit EOB safely.  RN aware of new onset of pain and inability to transfer pt at this time.  Will continue to assess pt's overall mobility and prepare pt for safe d/c home.      PT Assessment  Patient needs continued PT services    Follow Up Recommendations  Home health PT;Supervision/Assistance - 24 hour    Does the patient have the potential to tolerate intense rehabilitation      Barriers to Discharge        Equipment Recommendations  None recommended by PT    Recommendations for Other Services     Frequency Min 3X/week    Precautions / Restrictions Precautions Precautions: Fall Precaution Comments: Pt very high fall risk.  has fallen in past. Restrictions Weight Bearing Restrictions: No   Pertinent Vitals/Pain Pt moaned with mobility due to back and bilateral LE pain per sister.  Pt HR increased to 140s during mobility but quickly returned to 90s sitting EOB.      Mobility  Bed Mobility Bed Mobility: Rolling Right;Right Sidelying to  Sit;Sit to Supine Rolling Right: 1: +2 Total assist Rolling Right: Patient Percentage: 0% Right Sidelying to Sit: 1: +2 Total assist Right Sidelying to Sit: Patient Percentage: 0% Sit to Supine: 1: +2 Total assist Sit to Supine: Patient Percentage: 0% Details for Bed Mobility Assistance: Pt resisting movement at times and continued to keep knees into extension and when attempting to flex knees pt's trunk would go into extension.   Transfers Transfers: Not assessed Ambulation/Gait Ambulation/Gait Assistance: Not tested (comment)    Exercises     PT Diagnosis: Difficulty walking;Generalized weakness;Acute pain  PT Problem List: Decreased strength;Decreased activity tolerance;Decreased mobility;Decreased safety awareness PT Treatment Interventions: DME instruction;Gait training;Functional mobility training;Therapeutic activities;Therapeutic exercise;Patient/family education     PT Goals(Current goals can be found in the care plan section) Acute Rehab PT Goals Patient Stated Goal: did not set PT Goal Formulation: With patient/family Time For Goal Achievement: 01/25/13 Potential to Achieve Goals: Good  Visit Information  Assistance Needed: +2 PT/OT Co-Evaluation/Treatment: Yes History of Present Illness: Debbie [name] is an 52 y.o. female with hx of HTN, DM, hypothyroidism, Obesity, presents to the ER with syncope.  She was at a friend's party, and not yet had any meals, had sudden loss of consicousness for about 10 minutes, with no seizure activity, palpitation, chest pain or SOB       Prior Functioning  Home Living Family/patient expects to be discharged to:: Private residence Living Arrangements: Other  relatives Available Help at Discharge: Available 24 hours/day;Family Type of Home: Apartment Home Access: Level entry Home Layout: Two level Home Equipment: Walker - 2 wheels;Bedside commode;Wheelchair - manual  Lives With: Family Prior Function Level of Independence: Needs  assistance Gait / Transfers Assistance Needed: (A) needed at times with small distance with use of RW.  Two person assistance on pt's worse days of fatigue ADL's / Homemaking Assistance Needed: (A) to complete all ADLs Communication Communication: Prefers language other than English Dominant Hand: Right    Cognition  Cognition Arousal/Alertness: Awake/alert Overall Cognitive Status: Difficult to assess (language barrier however sister reports improved orientation) General Comments: Pt with min verbalization to sister during session.  Pt sister translated during session. Difficult to assess due to: Non-English speaking    Extremity/Trunk Assessment Lower Extremity Assessment Lower Extremity Assessment: RLE deficits/detail;LLE deficits/detail RLE Deficits / Details: Pt's would keep bilateral LE into extension sitting EOB and unable to assess due to pt resisting movement and unable to bend knees RLE: Unable to fully assess due to pain RLE Sensation: history of peripheral neuropathy LLE Deficits / Details: Pt's would keep bilateral LE into extension sitting EOB and unable to assess due to pt resisting movement and unable to bend knees LLE: Unable to fully assess due to pain LLE Sensation: history of peripheral neuropathy   Balance Balance Balance Assessed: Yes Static Sitting Balance Static Sitting - Balance Support: Feet unsupported;Left upper extremity supported Static Sitting - Level of Assistance: 1: +1 Total assist Static Sitting - Comment/# of Minutes: ~2 mintues prior to pt going into trunk extension and unsafe in upright posture.  Pt resisting movement at times.  When attempting flex bilateral knees to assist with sitting balance pt would go into extension making it unsafe to sit EOB.  Pt heavy posterior and left sided lean.  End of Session PT - End of Session Activity Tolerance: Patient limited by pain Patient left: in bed;with family/visitor present;with call bell/phone within  reach Nurse Communication: Mobility status;Need for lift equipment  GP     Debbie [name] 01/18/2013, 10:30 AM Jake Shark, PT DPT [phone]

## 2013-01-18 NOTE — Progress Notes (Signed)
Inpatient Diabetes Program Recommendations  AACE/ADA: New Consensus Statement on Inpatient Glycemic Control (2013)  Target Ranges:  Prepandial:   less than 140 mg/dL      Peak postprandial:   less than 180 mg/dL (1-2 hours)      Critically ill patients:  140 - 180 mg/dL    Results for MANIAH, NADING (MRN [id number]) as of 01/18/2013 14:05  Ref. Range 01/17/2013 06:16 01/17/2013 11:16 01/17/2013 17:20 01/17/2013 21:31 01/18/2013 06:28 01/18/2013 11:37  Glucose-Capillary Latest Range: 70-99 mg/dL 782 (H) 956 (H) 213 (H) 227 (H) 191 (H) 264 (H)    Inpatient Diabetes Program Recommendations Insulin - Basal: Please consider increasing Lantus to 15 units QHS.  Note: Blood glucose over the past 24 hours has ranged from 112-264 mg/dl.  Currently patient is ordered Lantus 10 units QHS, Novolog 0-20 units AC, Novolog 0-5 units HS, and Novolog 20 units TID with meals for inpatient glycemic control.  According to the chart, patient continue to have poor PO intake and has been refusing her meal tray at times.  NURSING:  Please be mindful not to give Novolog meal coverage if patient is not eating at least 50% of meal tray.  In order to further improve glycemic control, please consider increasing Lantus to 15 units QHS.  Will continue to follow.  Thanks, Orlando Penner, RN, MSN, CCRN Diabetes Coordinator Inpatient Diabetes Program [phone]

## 2013-01-18 NOTE — Progress Notes (Signed)
The interpreter phone was used to talk with the patient during the night.  She was alert and oriented without any signs of the confusion that she had the previous night.  She stated that she had difficulty swallowing food that was not chopped up due to an enlarged thyroid.  She has not coughed or had any difficulty drinking water or swallowing pills.  The [facility] mid-level was notified and the patient was placed on a dysphagia 3 diet.

## 2013-01-19 DIAGNOSIS — M545 Low back pain, unspecified: Secondary | ICD-10-CM

## 2013-01-19 DIAGNOSIS — E1142 Type 2 diabetes mellitus with diabetic polyneuropathy: Secondary | ICD-10-CM

## 2013-01-19 DIAGNOSIS — L97509 Non-pressure chronic ulcer of other part of unspecified foot with unspecified severity: Secondary | ICD-10-CM

## 2013-01-19 DIAGNOSIS — G8929 Other chronic pain: Secondary | ICD-10-CM

## 2013-01-19 DIAGNOSIS — E1169 Type 2 diabetes mellitus with other specified complication: Secondary | ICD-10-CM

## 2013-01-19 DIAGNOSIS — F4310 Post-traumatic stress disorder, unspecified: Secondary | ICD-10-CM

## 2013-01-19 LAB — BASIC METABOLIC PANEL
CO2: 26 mEq/L (ref 19–32)
Chloride: 107 mEq/L (ref 96–112)
GFR calc Af Amer: >90 mL/min (ref >90)
Sodium: 141 mEq/L (ref 135–145)

## 2013-01-19 LAB — GLUCOSE, CAPILLARY: Glucose-Capillary: 226 mg/dL — ABNORMAL HIGH (ref 70–99)

## 2013-01-19 MED ORDER — SENNA 8.6 MG PO TABS: 2.0000 | ORAL_TABLET | Freq: Every day | ORAL | Status: DC

## 2013-01-19 MED ORDER — TRAMADOL HCL 50 MG PO TABS: 50.0000 mg | ORAL_TABLET | Freq: Three times a day (TID) | ORAL | Status: DC | PRN

## 2013-01-19 MED ORDER — BISACODYL 10 MG RE SUPP: 10.0000 mg | Freq: Every day | RECTAL | Status: DC | PRN

## 2013-01-19 MED ORDER — INSULIN GLARGINE 100 UNIT/ML ~~LOC~~ SOLN: SUBCUTANEOUS | Status: DC

## 2013-01-19 MED ORDER — SENNA 8.6 MG PO TABS
2.0000 | ORAL_TABLET | Freq: Every day | ORAL | Status: DC
  Administered 2013-01-19: 17.2 mg via ORAL
  Filled 2013-01-19 (×2): qty 2

## 2013-01-19 MED ORDER — SENNA 8.6 MG PO TABS: 2.0000 | ORAL_TABLET | Freq: Every evening | ORAL | Status: DC | PRN

## 2013-01-19 MED ORDER — SERTRALINE HCL 25 MG PO TABS: 50.0000 mg | ORAL_TABLET | Freq: Every day | ORAL | Status: DC

## 2013-01-19 NOTE — Progress Notes (Signed)
Pt very upset this morning, called the interpretor to better understand the patients, but unable to console patient after speaking with her and interpretor, next I called the sister and her phone went to voice mail. Left message for sister to call back. Pt continues to be upset and due to language barrier, I am unable to console at this time.

## 2013-01-19 NOTE — Progress Notes (Addendum)
[facility] PROGRESS NOTE  Debbie [name] GNF:[id number] DOB: 1961/03/26 DOA: 01/16/2013 PCP: Debbie Dakins, MD  LATE ENTRY FOR 7/11: PT SEEN AT 1:30PM ON 7/11   Assessment/Plan: Hyperglycemia Uncontrolled diabetes no Diabetes mellitus type 2 with peripheral artery disease  -Better blood glucose control today, but her by mouth intake is minimal -Continue current Lantus for now follow with ssi, her hemoglobin A1c is markedly elevated at 11.5, she'll need to stay on the Lantus on d/c -will not add oral hypoglycemics for now her po intake is not reliable- she ate some last pm but not this and family present state that even at home she does not eat predictably -will monitor today and plan d/c in am if BG  Adjustment disorder with mixed anxiety and depressed mood  -continue Zoloft 50mg  Unspecified hypothyroidism  -On no meds, TSH within normal Altered mental status/acute metabolic encephalopathy -Possibly secondary to#1 -much Clinically improved per family- at baseline ? UTI -Urinalysis is not consistent with a UTI, urine culture is neg so far- will d/c abx -monitor overnight for any fever LLQ abd pain/tenderness - CT scan of abdomen and pelvis -neg -add laxative/enema for possible constipation  Code Status: full Family Communication: Her sister at bedside and congregational nurse who follows her outpt Disposition Plan: to home when medically stable   Consultants:  none  Procedures:  none  Antibiotics:  Cefepime and cipro 7/9>>7/11  HPI/Subjective: Patient c/o constipation, states still with lower abd pain but less. Ate last pm but not this am, per sister at bedside interpreting.   Objective: Filed Vitals:   01/17/13 2100 01/18/13 0558 01/18/13 1520 01/18/13 2042  BP: 129/81 135/70 122/69 109/70  Pulse: 83 80 75 81  Temp: 98 F (36.7 C) 98.2 F (36.8 C) 97.3 F (36.3 C) 97.6 F (36.4 C)  TempSrc: Oral Oral Oral Oral  Resp: 18 17 17 16   Height:      Weight:   98.884 kg (218 lb)    SpO2: 95% 94% 98% 97%    Intake/Output Summary (Last 24 hours) at 01/19/13 0124 Last data filed at 01/18/13 1909  Gross per 24 hour  Intake 1866.25 ml  Output   2850 ml  Net -983.75 ml   Filed Weights   01/17/13 0515 01/17/13 0700 01/18/13 0558  Weight: 99.61 kg (219 lb 9.6 oz) 98.612 kg (217 lb 6.4 oz) 98.884 kg (218 lb)    Exam:  General: alert & oriented x 3, In NAD Cardiovascular: RRR, nl S1 s2 Respiratory: CTAB Abdomen: soft +BS , decreasedleft lower quadrant tenderness, no rebound, no masses palpable, nondistended. Extremities: No cyanosis and no edema    Data Reviewed: Basic Metabolic Panel:  Recent Labs Lab 01/16/13 1235 01/16/13 1411 01/16/13 1429 01/18/13 0455  NA 135 134* 136 139  K 4.6 4.4 4.5 3.7  CL 98 98 103 106  CO2 23 22  --  24  GLUCOSE 422* 389* 410* 201*  BUN 17 18 19 12   CREATININE 0.58 0.58 0.60 0.63  CALCIUM 10.2 10.1  --  8.7   Liver Function Tests:  Recent Labs Lab 01/16/13 1411  AST 29  ALT 45*  ALKPHOS 110  BILITOT 0.4  PROT 7.6  ALBUMIN 3.7   No results found for this basename: LIPASE, AMYLASE,  in the last 168 hours No results found for this basename: AMMONIA,  in the last 168 hours CBC:  Recent Labs Lab 01/16/13 1429 01/16/13 1507  WBC  --  9.8  NEUTROABS  --  5.6  HGB 17.3* 14.8  HCT 51.0* 43.0  MCV  --  79.3  PLT  --  238   Cardiac Enzymes:  Recent Labs Lab 01/16/13 2345 01/17/13 0635 01/17/13 1038  TROPONINI <0.30 <0.30 <0.30   BNP (last 3 results)  Recent Labs  11/12/12 0545  PROBNP 113.9   CBG:  Recent Labs Lab 01/17/13 2131 01/18/13 0628 01/18/13 1137 01/18/13 1638 01/18/13 2127  GLUCAP 227* 191* 264* 135* 164*    Recent Results (from the past 240 hour(s))  URINE CULTURE     Status: None   Collection Time    01/16/13  4:56 PM      Result Value Range Status   Specimen Description URINE, RANDOM   Final   Special Requests NONE   Final   Culture  Setup Time  01/16/2013 23:23   Final   Colony Count NO GROWTH   Final   Culture NO GROWTH   Final   Report Status 01/17/2013 FINAL   Final     Studies: Ct Abdomen Pelvis W Contrast  01/17/2013   *RADIOLOGY REPORT*  Clinical Data: Left lower quadrant pain.  Nausea and vomiting.  CT ABDOMEN AND PELVIS WITH CONTRAST  Technique:  Multidetector CT imaging of the abdomen and pelvis was performed following the standard protocol during bolus administration of intravenous contrast.  Contrast: OMNIPAQUE IOHEXOL 300 MG/ML  SOLN  Comparison: Noncontrast CT on 11/09/2012  Findings: Mild dependent atelectasis is seen in both lung bases. The liver, gallbladder, pancreas, spleen, adrenal glands, and kidneys are normal in appearance.  No evidence of hydronephrosis.  No soft tissue masses or lymphadenopathy identified within the abdomen or pelvis.  Uterus and adnexal regions are unremarkable. Normal appendix is visualized.  No evidence of inflammatory process or abnormal fluid collections.  No evidence of bowel wall thickening, dilatation, or hernia.  IMPRESSION:  1.  No acute findings or other significant abnormality within the abdomen or pelvis. 2.  Mild dependent bibasilar atelectasis.   Original Report Authenticated By: Myles Rosenthal, M.D.    Scheduled Meds: . enoxaparin (LOVENOX) injection  40 mg Subcutaneous Q24H  . insulin aspart  0-20 Units Subcutaneous TID WC  . insulin aspart  0-5 Units Subcutaneous QHS  . insulin aspart  20 Units Subcutaneous TID WC  . insulin glargine  10 Units Subcutaneous QHS  . lisinopril  5 mg Oral Daily  . loteprednol  1 drop Both Eyes BID  . sertraline  50 mg Oral Daily  . sodium chloride  3 mL Intravenous Q12H   Continuous Infusions: . sodium chloride 125 mL/hr at 01/18/13 1223    Active Problems:   Diabetes mellitus type 2 with peripheral artery disease   Adjustment disorder with mixed anxiety and depressed mood   Unspecified hypothyroidism   Diabetic foot ulcers   Altered  mental status    Time spent: >35    [name]  [facility] Pager [phone]. If 7PM-7AM, please contact night-coverage at [email or web address], password [facility] 01/19/2013, 1:24 AM  LOS: 3 days

## 2013-01-19 NOTE — Progress Notes (Signed)
?????    CARE MANAGEMENT NOTE 01/19/2013  Patient:  Debbie [name], Debbie [name]   Account Number:  [id number]  Date Initiated:  01/19/2013  Documentation initiated by:  [facility]  Subjective/Objective Assessment:     Action/Plan:   lives at home with sister.   Anticipated DC Date:  01/19/2013   Anticipated DC Plan:  HOME W HOME HEALTH SERVICES      DC Planning Services  CM consult      [facility] Choice  HOME HEALTH   Choice offered to / List presented to:  C-5 Sibling   DME arranged  HOSPITAL BED  WHEELCHAIR - MANUAL      DME agency  [facility]     HH arranged  HH-2 PT  HH-3 OT  HH-1 RN      [facility] agency  [facility]   Status of service:  Completed, signed off Medicare Important Message given?   (If response is "NO", the following Medicare IM given date fields will be blank) Date Medicare IM given:   Date Additional Medicare IM given:    Discharge Disposition:  HOME W HOME HEALTH SERVICES  Per UR Regulation:    If discussed at Long Length of Stay Meetings, dates discussed:    Comments:  01/19/2013 1230 NCM spoke to pt's sister, Aniceto Boss. Pt lives at home with sister. She is legally blind. Offered choice for [facility]. Requested agency that would accept insurance. Contacted [facility] for DME and faxed referral to [facility] for [facility] for scheduled dc home today. [facility] added to dc instructions. Isidoro Donning RN CCM Case Mgmt phone [phone]

## 2013-01-19 NOTE — Discharge Summary (Signed)
Physician Discharge Summary  Debbie [name] ZOX:[id number] DOB: 1961/06/26 DOA: 01/16/2013  PCP: Standley Dakins, MD  Admit date: 01/16/2013 Discharge date: 01/19/2013  Time spent: >30 minutes  Recommendations for Outpatient Follow-up:      Follow-up Information   Follow up with [facility]. (Home Health Physical Therapy, RN and Occupational Therapy)    Contact information:   [phone]      Follow up with Standley Dakins, MD. (in 1week, call for appt upon disharge)    Contact information:   [address] [place] [address] [phone]     Recommend referral per PCP to psychiatry outpatient for further management of PTSD/anxiety/depression with depressed mood/psych  Followup with Dr. Gregary Signs [place]-endocrinologist on 7/21 as scheduled  Discharge Diagnoses:  Active Problems:   Diabetes mellitus type 2 with peripheral artery disease   Adjustment disorder with mixed anxiety and depressed mood   Unspecified hypothyroidism   Diabetic foot ulcers   Altered mental status   Discharge Condition: Improved/stable  Diet recommendation: Modified carb  Filed Weights   01/17/13 0700 01/18/13 0558 01/19/13 0609  Weight: 98.612 kg (217 lb 6.4 oz) 98.884 kg (218 lb) 97.569 kg (215 lb 1.6 oz)    History of present illness:    Hospital Course:   Hyperglycemia Uncontrolled diabetes no Diabetes mellitus type 2 with peripheral artery disease  -Better blood glucose control today, but her by mouth intake is minimal  -Continue current Lantus for now follow with ssi, her hemoglobin A1c is markedly elevated at 11.5, she'll need to stay on the Lantus on d/c  -Her oral hypoglycemics were discontinued to simplify her regimen and to improve her blood glucose control as her A1c is markedly elevated as above on her preadmission regimen. -Patient and family have been instructed on the above changes and she was already on on Norvasc prior to admission and so they're comfortable with the  injections. -Her blood glucose control is improved at this time, home health RN to follow outpatient, she also has a Engineer, water that follows up with her. Adjustment disorder with mixed anxiety and depressed mood  -Patient did not have any suicidal or homicidal ideations -Her Zoloft was increased from 25 to50 mg during this hospital stay -Recommend referral to psychiatry outpatient further recommendations her PCP. Unspecified hypothyroidism  -On no meds, TSH within normal  Altered mental status/acute metabolic encephalopathy  -Possibly secondary to#1  -much Clinically improved per family Urine studies not consistent with urinary tract infection -Pt had been empirically started on antibiotics for UTI , but the Urinalysis obtained on admission was not c/w with UTI, and urine cultures to date have showed no growth. Antibiotics were discontinued and patient has remained afebrile hemodynamically with no leukocytosis LLQ abd pain/tenderness  - CT scan of abdomen and pelvis -neg  - laxative/enema was added for possible constipation  -On followup today her pain has resolved.  patient was seen by PT OT while in the hospital and home health services recommended, family also requested a wheelchair and hospital bed and have been set up per case management   Procedures:  none  Consultations:  none  Discharge Exam: Filed Vitals:   01/18/13 2042 01/19/13 0526 01/19/13 0609 01/19/13 1012  BP: 109/70 110/52  104/72  Pulse: 81 90    Temp: 97.6 F (36.4 C) 98 F (36.7 C)    TempSrc: Oral Oral    Resp: 16 18    Height:      Weight:   97.569 kg (215  lb 1.6 oz)   SpO2: 97% 95%     Exam:  General: alert & oriented x 3, In NAD  Cardiovascular: RRR, nl S1 s2  Respiratory: CTAB  Abdomen: soft +BS , decreasedleft lower quadrant tenderness, no rebound, no masses palpable, nondistended.  Extremities: No cyanosis and no edema   Discharge Instructions  Discharge Orders   Future  Appointments Provider Department Dept Phone   01/28/2013 8:30 AM Sylvester Harder [place], [place] [facility] [phone]   01/28/2013 1:30 PM Romero Belling, MD [facility] [phone]   Future Orders Complete By Expires     Diet Carb Modified  As directed     Increase activity slowly  As directed         Medication List    STOP taking these medications       glimepiride 4 MG tablet  Commonly known as:  AMARYL     ONGLYZA 5 MG Tabs tablet  Generic drug:  saxagliptin HCl      TAKE these medications       acetaminophen 325 MG tablet  Commonly known as:  TYLENOL  Take 2 tablets (650 mg total) by mouth every 6 (six) hours as needed.     aspirin 81 MG tablet  Take 81 mg by mouth daily.     insulin aspart 100 unit/ml Soln  Commonly known as:  novoLOG  Inject 20 Units into the skin 3 (three) times daily with meals.     insulin glargine 100 UNIT/ML injection  Commonly known as:  LANTUS  Lantus insulin pen 13 units subcutaneous at bedtime     Insulin Pen Needle 32G X 4 MM Misc  Commonly known as:  COMFORT EZ PEN NEEDLES  1 Container by Does not apply route 3 (three) times daily before meals.     lisinopril 5 MG tablet  Commonly known as:  PRINIVIL,ZESTRIL  Take 1 tablet (5 mg total) by mouth daily.     Loteprednol Etabonate 0.5 % Gel  Apply 1 Container to eye 2 (two) times daily.     neomycin-polymyxin-pramoxine 1 % cream  Commonly known as:  NEOSPORIN PLUS  Apply 1 application topically 2 (two) times daily.     senna 8.6 MG Tabs  Commonly known as:  SENOKOT  Take 2 tablets (17.2 mg total) by mouth at bedtime as needed (for constipation).     sertraline 25 MG tablet  Commonly known as:  ZOLOFT  Take 2 tablets (50 mg total) by mouth daily.     traMADol 50 MG tablet  Commonly known as:  ULTRAM  Take 1 tablet (50 mg total) by mouth every 8 (eight) hours as needed for pain.       Allergies  Allergen Reactions  .  Penicillins Rash       Follow-up Information   Follow up with [facility]. (Home Health Physical Therapy, RN and Occupational Therapy)    Contact information:   [phone]      Follow up with Standley Dakins, MD. (in 1week, call for appt upon disharge)    Contact information:   [address] [place] [address] [phone]        The results of significant diagnostics from this hospitalization (including imaging, microbiology, ancillary and laboratory) are listed below for reference.    Significant Diagnostic Studies: Ct Head Wo Contrast  01/16/2013   *RADIOLOGY REPORT*  Clinical Data: 52 year old female hyperglycemia, altered mental status, headache, eye pain.  CT HEAD WITHOUT CONTRAST  Technique:  Contiguous axial images were obtained from the base of the skull through the vertex without contrast.  Comparison: Brain MRI 12/31/2012 and earlier.  Findings: Visualized paranasal sinuses and mastoids are clear. Visualized orbits and scalp soft tissues are within normal limits. No acute osseous abnormality identified.  No midline shift, mass effect, or evidence of mass lesion.  No ventriculomegaly.  Stable cerebral volume.  Patchy cerebral white matter hypodensity appears stable from the recent MRI. No acute intracranial hemorrhage identified.  No suspicious intracranial vascular hyperdensity. No evidence of cortically based acute infarction identified.  IMPRESSION: No acute intracranial abnormality.  Stable nonspecific white matter changes.   Original Report Authenticated By: Erskine Speed, M.D.   Ct Head (brain) Wo Contrast  12/29/2012   *RADIOLOGY REPORT*  Clinical Data: Syncope, out for 10 minutes, preceded by dizziness; now weak on left side  CT HEAD WITHOUT CONTRAST  Technique:  Contiguous axial images were obtained from the base of the skull through the vertex without contrast.  Comparison: 10/31/2012  Findings: Generalized atrophy. Normal ventricular morphology. No  midline shift or mass effect. Minimal small vessel chronic ischemic changes of deep cerebral white matter. No intracranial hemorrhage, mass lesion or evidence of acute infarction. No extra-axial fluid collections. Bones and sinuses unremarkable.  IMPRESSION: Atrophy with minimal small vessel chronic ischemic changes of deep cerebral white matter. No acute intracranial abnormalities.  Findings called to Dr. Karma Ganja on 12/29/2012 at 2250 hours.   Original Report Authenticated By: Ulyses Southward, M.D.   Ct Angio Chest Pe W/cm &/or Wo Cm  12/30/2012   *RADIOLOGY REPORT*  Clinical Data: Syncope, dizziness, chest pain, history diabetes, hypertension  CT ANGIOGRAPHY CHEST  Technique:  Multidetector CT imaging of the chest using the standard protocol during bolus administration of intravenous contrast. Multiplanar reconstructed images including MIPs were obtained and reviewed to evaluate the vascular anatomy.  Contrast: OMNIPAQUE IOHEXOL 350 MG/ML SOLN  Comparison: 10/31/2012  Findings: Asymmetric enlargement of right thyroid lobe containing calcification and nodule. Aorta normal caliber without aneurysm or dissection. Scattered respiratory motion artifacts. Beam-hardening secondary to body habitus. No evidence of pulmonary embolism identified within limitations as above. No thoracic adenopathy. Visualized portions of liver and spleen unremarkable. Lungs grossly clear. No pleural effusion or pneumothorax. No acute osseous findings.  IMPRESSION: Limitations of exam secondary to beam hardening and respiratory motion. No evidence of pulmonary embolism identified. Enlarged right thyroid lobe containing nodule and calcification. Consider further evaluation with thyroid ultrasound.  If patient is clinically hyperthyroid, consider nuclear medicine thyroid uptake and scan.   Original Report Authenticated By: Ulyses Southward, M.D.   Mr Brain Wo Contrast  12/31/2012   *RADIOLOGY REPORT*  Clinical Data: Hypertensive diabetic patient  with syncopal episode.  MRI HEAD WITHOUT CONTRAST  Technique:  Multiplanar, multiecho pulse sequences of the brain and surrounding structures were obtained according to standard protocol without intravenous contrast.  Comparison: 12/29/2012 CT.  No comparison MR.  Findings: No acute infarct.  Right frontal lobe areas of blood breakdown products may represent changes from prior trauma or hemorrhagic ischemia.  Scattered mild nonspecific white matter type changes most likely represents result of small vessel disease in this diabetic hypertensive patient.  Global atrophy without hydrocephalus.  No intracranial mass lesion detected on this unenhanced exam.  Cervical medullary junction and pineal region unremarkable.  Minimal exophthalmos.  Mild degenerative changes C3-4 incompletely assessed on the present exam.  Major intracranial vascular structures are patent.  Partially empty sella  may be an incidental finding.  This can be seen in patients with pseudotumor cerebri.  Prominent Meckel's cave may incidental finding.  IMPRESSION: No acute infarct.  Right frontal lobe areas of blood breakdown products may represent changes from prior trauma or hemorrhagic ischemia.  Scattered mild nonspecific white matter type changes most likely represents result of small vessel disease in this diabetic hypertensive patient.  Global atrophy without hydrocephalus.  Partially empty sella may be an incidental finding.  This can be seen in patients with pseudotumor cerebri.   Original Report Authenticated By: Lacy Duverney, M.D.   US Soft Tissue Head/neck  12/31/2012   *RADIOLOGY REPORT*  Clinical Data: Thyroid nodules.  THYROID ULTRASOUND  Technique: Ultrasound examination of the thyroid gland and adjacent soft tissues was performed.  Comparison:  None.  Findings:  Right thyroid lobe:  5.2 x 2.4 x 3.1 cm. Left thyroid lobe:  5.2 x 1.9 x 1.8 cm. Isthmus:  4 mm.  Focal nodules:  Multiple bilateral thyroid nodules.  The dominant nodule on  the right is a solid mixed density nodule in the mid to lower pole measuring 3.6 x 2.3 x 2.2 cm.  Scattered microcalcifications.  In the upper pole on the right, there is a solid 2.7 x 2.1 x 1.6 cm.  This extends into the isthmus.  On the left, there is a 2.1 x 1.3 x 1.3 cm mid to lower pole nodule with heterogeneous echotexture.  Lymphadenopathy:  Small scattered cervical lymph nodes, none pathologically enlarged based on short axis diameter.  IMPRESSION: Bilateral solid heterogeneous nodules, the largest being in the right lower pole measuring 3.6 cm with scattered microcalcifications.  Recommend tissue sampling.   Original Report Authenticated By: Charlett Nose, M.D.   Ct Abdomen Pelvis W Contrast  01/17/2013   *RADIOLOGY REPORT*  Clinical Data: Left lower quadrant pain.  Nausea and vomiting.  CT ABDOMEN AND PELVIS WITH CONTRAST  Technique:  Multidetector CT imaging of the abdomen and pelvis was performed following the standard protocol during bolus administration of intravenous contrast.  Contrast: OMNIPAQUE IOHEXOL 300 MG/ML  SOLN  Comparison: Noncontrast CT on 11/09/2012  Findings: Mild dependent atelectasis is seen in both lung bases. The liver, gallbladder, pancreas, spleen, adrenal glands, and kidneys are normal in appearance.  No evidence of hydronephrosis.  No soft tissue masses or lymphadenopathy identified within the abdomen or pelvis.  Uterus and adnexal regions are unremarkable. Normal appendix is visualized.  No evidence of inflammatory process or abnormal fluid collections.  No evidence of bowel wall thickening, dilatation, or hernia.  IMPRESSION:  1.  No acute findings or other significant abnormality within the abdomen or pelvis. 2.  Mild dependent bibasilar atelectasis.   Original Report Authenticated By: Myles Rosenthal, M.D.   Dg Chest Port 1 View  01/16/2013   *RADIOLOGY REPORT*  Clinical Data: Chest pain, shortness of breath, cough, diabetes, hypertension, altered mental status  PORTABLE  CHEST - 1 VIEW  Comparison: Portable exam 1443 hours compared to 11/09/2012  Findings: Upper-normal size of cardiac silhouette. Mediastinal contours and pulmonary vascularity normal. Minimal right basilar atelectasis. Lungs otherwise clear. No pleural effusion or pneumothorax. Bones unremarkable.  IMPRESSION: Minimal right basilar atelectasis.   Original Report Authenticated By: Ulyses Southward, M.D.    Microbiology: Recent Results (from the past 240 hour(s))  URINE CULTURE     Status: None   Collection Time    01/16/13  4:56 PM      Result Value Range Status   Specimen Description URINE,  RANDOM   Final   Special Requests NONE   Final   Culture  Setup Time 01/16/2013 23:23   Final   Colony Count NO GROWTH   Final   Culture NO GROWTH   Final   Report Status 01/17/2013 FINAL   Final     Labs: Basic Metabolic Panel:  Recent Labs Lab 01/16/13 1235 01/16/13 1411 01/16/13 1429 01/18/13 0455 01/19/13 0415  NA 135 134* 136 139 141  K 4.6 4.4 4.5 3.7 4.0  CL 98 98 103 106 107  CO2 23 22  --  24 26  GLUCOSE 422* 389* 410* 201* 211*  BUN 17 18 19 12 10   CREATININE 0.58 0.58 0.60 0.63 0.65  CALCIUM 10.2 10.1  --  8.7 8.9   Liver Function Tests:  Recent Labs Lab 01/16/13 1411  AST 29  ALT 45*  ALKPHOS 110  BILITOT 0.4  PROT 7.6  ALBUMIN 3.7   No results found for this basename: LIPASE, AMYLASE,  in the last 168 hours No results found for this basename: AMMONIA,  in the last 168 hours CBC:  Recent Labs Lab 01/16/13 1429 01/16/13 1507  WBC  --  9.8  NEUTROABS  --  5.6  HGB 17.3* 14.8  HCT 51.0* 43.0  MCV  --  79.3  PLT  --  238   Cardiac Enzymes:  Recent Labs Lab 01/16/13 2345 01/17/13 0635 01/17/13 1038  TROPONINI <0.30 <0.30 <0.30   BNP: BNP (last 3 results)  Recent Labs  11/12/12 0545  PROBNP 113.9   CBG:  Recent Labs Lab 01/18/13 0628 01/18/13 1137 01/18/13 1638 01/18/13 2127 01/19/13 0602  GLUCAP 191* 264* 135* 164* 226*        Signed:  Arvilla Salada C  [facility] 01/19/2013, 1:36 PM

## 2013-01-19 NOTE — Progress Notes (Signed)
PT, FAMILY AND FRIEND GIVEN DISCHARGE INSTRUCTIONS, EVERYONE STATED THEY UNDERSTOOD D/C INSTRUCTIONS, [facility] CALLED AND STATED THEY WILL DELIVER DME TO HOME TODAY, ALL QUESTIONS ANSWERED, DISCHARGED BY WHEELCHAIR BY Karris Deangelo NT, NAD NOTED, PT SEEMED TO BE HAPPY ABOUT GOING HOME.

## 2013-01-21 LAB — GLUCOSE, CAPILLARY: Glucose-Capillary: 179 mg/dL — ABNORMAL HIGH (ref 70–99)

## 2013-01-22 ENCOUNTER — Telehealth: Payer: Self-pay | Admitting: Family Medicine

## 2013-01-22 NOTE — Telephone Encounter (Signed)
Pt lotmax eyedrops (5mg ) needs a script; she is also on two contradicting scripts (tramadol & zoloft) and need to know if Doctor wants to change

## 2013-01-28 ENCOUNTER — Other Ambulatory Visit: Payer: Self-pay | Admitting: Endocrinology

## 2013-01-28 ENCOUNTER — Encounter: Payer: Self-pay | Admitting: Endocrinology

## 2013-01-28 ENCOUNTER — Ambulatory Visit (INDEPENDENT_AMBULATORY_CARE_PROVIDER_SITE_OTHER): Payer: Medicaid Other | Admitting: Endocrinology

## 2013-01-28 ENCOUNTER — Ambulatory Visit: Payer: Self-pay | Admitting: *Deleted

## 2013-01-28 VITALS — BP 132/74 | HR 77

## 2013-01-28 DIAGNOSIS — E1151 Type 2 diabetes mellitus with diabetic peripheral angiopathy without gangrene: Secondary | ICD-10-CM

## 2013-01-28 DIAGNOSIS — I739 Peripheral vascular disease, unspecified: Secondary | ICD-10-CM

## 2013-01-28 DIAGNOSIS — E042 Nontoxic multinodular goiter: Secondary | ICD-10-CM

## 2013-01-28 DIAGNOSIS — E1159 Type 2 diabetes mellitus with other circulatory complications: Secondary | ICD-10-CM

## 2013-01-28 NOTE — Progress Notes (Signed)
Subjective:    Patient ID: Debbie [name], female    DOB: 10/19/1960, 52 y.o.   MRN: [id number]  HPI pt states 11 years h/o dm.  She has multiple chronic complications.  he has been on insulin x 10 years.  pt says his diet is very good, but exercise is severely limited by health probs.  Pt's sister says cbg's are in the 200's in am, which is the only time of day she checks.   Pt has few years of moderate pain at the lower extremities, and assoc numbness.   She lives with 2 sisters, which check cbg's and give insulin.  Pt is never at home alone.   Past Medical History  Diagnosis Date  . Type II or unspecified type diabetes mellitus with unspecified complication, uncontrolled     x ~ 10 years  . Hypertension     x ~ 10 years  . Hypothyroidism   . Chest pain     a. ? MI in 2010, [place]->medically managed  . Diabetic foot ulcer   . Obesity     Past Surgical History  Procedure Laterality Date  . Back surgery    . Tubal ligation      History   Social History  . Marital Status: Single    Spouse Name: N/A    Number of Children: N/A  . Years of Education: N/A   Occupational History  . Not on file.   Social History Main Topics  . Smoking status: Never Smoker   . Smokeless tobacco: Not on file  . Alcohol Use: No  . Drug Use: No  . Sexually Active: No   Other Topics Concern  . Not on file   Social History Narrative   From [place], [place].  Moved to [place] to live with sister in April of 2014.  She does not routinely exercise.    Current Outpatient Prescriptions on File Prior to Visit  Medication Sig Dispense Refill  . acetaminophen (TYLENOL) 325 MG tablet Take 2 tablets (650 mg total) by mouth every 6 (six) hours as needed.      Marland Kitchen aspirin 81 MG tablet Take 81 mg by mouth daily.      . insulin aspart (NOVOLOG) 100 unit/ml SOLN Inject 20 Units into the skin 3 (three) times daily with meals.  1 pen  5  . insulin glargine (LANTUS) 100 UNIT/ML injection Lantus insulin pen 13 units  subcutaneous at bedtime  10 mL  12  . Insulin Pen Needle (COMFORT EZ PEN NEEDLES) 32G X 4 MM MISC 1 Container by Does not apply route 3 (three) times daily before meals.  1 each  11  . lisinopril (PRINIVIL,ZESTRIL) 5 MG tablet Take 1 tablet (5 mg total) by mouth daily.  30 tablet  5  . Loteprednol Etabonate 0.5 % GEL Apply 1 Container to eye 2 (two) times daily.  1 Bottle  3  . neomycin-polymyxin-pramoxine (NEOSPORIN PLUS) 1 % cream Apply 1 application topically 2 (two) times daily.      Marland Kitchen senna (SENOKOT) 8.6 MG TABS Take 2 tablets (17.2 mg total) by mouth at bedtime as needed (for constipation).  30 each  0  . sertraline (ZOLOFT) 25 MG tablet Take 2 tablets (50 mg total) by mouth daily.  60 tablet  5  . traMADol (ULTRAM) 50 MG tablet Take 1 tablet (50 mg total) by mouth every 8 (eight) hours as needed for pain.  30 tablet  1   No current facility-administered medications on  file prior to visit.    Allergies  Allergen Reactions  . Penicillins Rash    Family History  Problem Relation Age of Onset  . Diabetes Father     died in his 53's?  . Diabetes Father     died in her 80's?  . Diabetes Sister     alive and well.    BP 132/74  Pulse 77  SpO2 98%  LMP 12/13/2012  Review of Systems denies chest pain, n/v, urinary frequency, hypoglycemia, menopausal sxs, and memory loss.  She has chronic visual loss, sob, excessive diaphoresis, depression, rhinorrhea, easy bruising, headache, muscle cramps, and weight loss.    Objective:   Physical Exam VS: see vs page GEN: no distress.  In wheelchair.  HEAD: head: no deformity eyes: no periorbital swelling, no proptosis external nose and ears are normal mouth: no lesion seen NECK: there is a 2 cm right thyroid nodule CHEST WALL: no deformity LUNGS:  Clear to auscultation CV: reg rate and rhythm, no murmur ABD: abdomen is soft, nontender.  no hepatosplenomegaly.  not distended.  no hernia MUSCULOSKELETAL: muscle bulk and strength are  grossly normal.  no obvious joint swelling.  gait is normal and steady.  PULSES:  no carotid bruit NEURO:  cn 2-12 grossly intact.   readily moves all 4's.  Slow tremor of the RUE. SKIN:  Normal texture and temperature.  No rash or suspicious lesion is visible.   NODES:  None palpable at the neck PSYCH: alert, oriented x3.  Does not appear anxious nor depressed.    Lab Results  Component Value Date   TSH 3.912 01/16/2013   Lab Results  Component Value Date   HGBA1C 11.5* 01/16/2013  (i reviewed [place] report)    Assessment & Plan:  DM: she needs increased rx.  This insulin regimen was chosen from multiple options, for its simplicity.  The benefits of glycemic control must be weighed against the risks of hypoglycemia.   Multinodular goiter, uncertain etiology.   Anxiety/depression: she has limited functional capacity.  This limits the rx of her dm. Weight loss, prob due to severe hyperglycemia.

## 2013-01-28 NOTE — Patient Instructions (Addendum)
good diet and exercise habits significanly improve the control of your diabetes.  please let me know if you wish to be referred to a dietician.  high blood sugar is very risky to your health.  you should see an eye doctor every year.  You are at higher than average risk for pneumonia and hepatitis-B.  You should be vaccinated against both.   controlling your blood pressure and cholesterol drastically reduces the damage diabetes does to your body.  this also applies to quitting smoking.  please discuss these with your doctor.  check your blood sugar twice a day.  vary the time of day when you check, between before the 3 meals, and at bedtime.  also check if you have symptoms of your blood sugar being too high or too low.  please keep a record of the readings and bring it to your next appointment here.  please call us sooner if your blood sugar goes below 70, or if you have a lot of readings over 200.   Let's check a biopsy of the thyroid.  you will receive a phone call, about a day and time for an appointment.   we will need to take this complex situation in stages For now, please increase the lantus to 20 units at bedtime.  Please come back for a follow-up appointment in 1 month.

## 2013-01-30 ENCOUNTER — Other Ambulatory Visit (HOSPITAL_COMMUNITY)
Admission: RE | Admit: 2013-01-30 | Discharge: 2013-01-30 | Disposition: A | Discharge status: Home / Self Care | Payer: Medicaid Other | Source: Ambulatory Visit | Attending: Interventional Radiology | Admitting: Interventional Radiology

## 2013-01-30 ENCOUNTER — Ambulatory Visit
Admission: RE | Admit: 2013-01-30 | Discharge: 2013-01-30 | Disposition: A | Discharge status: Home / Self Care | Payer: Medicaid Other | Source: Ambulatory Visit | Attending: Endocrinology | Admitting: Endocrinology

## 2013-01-30 ENCOUNTER — Telehealth: Payer: Self-pay

## 2013-01-30 DIAGNOSIS — E042 Nontoxic multinodular goiter: Secondary | ICD-10-CM

## 2013-01-30 DIAGNOSIS — E049 Nontoxic goiter, unspecified: Secondary | ICD-10-CM | POA: Insufficient documentation

## 2013-01-30 NOTE — Telephone Encounter (Signed)
Please call and inform home health nurse: I don't believe that a pain patch would be appropriate for the patient at this time.  The patient missed last office visit scheduled.  Continue current meds as prescribed in hospital until patient can follow up.  Pt has been referred to psychiatry (behavioral health) for further eval and mgmt of depression, PTSD, and complex psychiatric issues.    Rodney Langton, MD, CDE, FAAFP [facility] [place], [place]

## 2013-01-30 NOTE — Telephone Encounter (Signed)
[facility]bie [name] needs clarification That this patient can take zoloft with tramadol Also feels her pain in not managed well  Can we think about prescribing a pain patch Her number is [phone]

## 2013-01-31 ENCOUNTER — Telehealth: Payer: Self-pay | Admitting: *Deleted

## 2013-01-31 NOTE — Telephone Encounter (Signed)
Spoke with heather at [facility] Continue with prescribed medication  Does not recommend pain patch Follow up with behavior health Per Dr Laural Benes

## 2013-01-31 NOTE — Telephone Encounter (Signed)
01/31/13 Patient made aware that pain patch would not be appropriate. Instructed to continue current medication has a referral  To psychiatry for further eval and management depression.

## 2013-01-31 NOTE — Telephone Encounter (Signed)
.  .  .        xx

## 2013-02-03 ENCOUNTER — Encounter (HOSPITAL_COMMUNITY): Payer: Self-pay | Admitting: Emergency Medicine

## 2013-02-03 ENCOUNTER — Emergency Department (HOSPITAL_COMMUNITY): Payer: Medicaid Other

## 2013-02-03 ENCOUNTER — Emergency Department (HOSPITAL_COMMUNITY)
Admission: EM | Admit: 2013-02-03 | Discharge: 2013-02-04 | Disposition: A | Discharge status: Home / Self Care | Payer: Medicaid Other | Attending: Emergency Medicine | Admitting: Emergency Medicine

## 2013-02-03 DIAGNOSIS — Z79899 Other long term (current) drug therapy: Secondary | ICD-10-CM | POA: Insufficient documentation

## 2013-02-03 DIAGNOSIS — H269 Unspecified cataract: Secondary | ICD-10-CM | POA: Insufficient documentation

## 2013-02-03 DIAGNOSIS — E669 Obesity, unspecified: Secondary | ICD-10-CM | POA: Insufficient documentation

## 2013-02-03 DIAGNOSIS — F419 Anxiety disorder, unspecified: Secondary | ICD-10-CM

## 2013-02-03 DIAGNOSIS — R51 Headache: Secondary | ICD-10-CM | POA: Insufficient documentation

## 2013-02-03 DIAGNOSIS — R0602 Shortness of breath: Secondary | ICD-10-CM | POA: Insufficient documentation

## 2013-02-03 DIAGNOSIS — IMO0001 Reserved for inherently not codable concepts without codable children: Secondary | ICD-10-CM | POA: Insufficient documentation

## 2013-02-03 DIAGNOSIS — R739 Hyperglycemia, unspecified: Secondary | ICD-10-CM

## 2013-02-03 DIAGNOSIS — R197 Diarrhea, unspecified: Secondary | ICD-10-CM | POA: Insufficient documentation

## 2013-02-03 DIAGNOSIS — F411 Generalized anxiety disorder: Secondary | ICD-10-CM | POA: Insufficient documentation

## 2013-02-03 DIAGNOSIS — E039 Hypothyroidism, unspecified: Secondary | ICD-10-CM | POA: Insufficient documentation

## 2013-02-03 DIAGNOSIS — I10 Essential (primary) hypertension: Secondary | ICD-10-CM | POA: Insufficient documentation

## 2013-02-03 DIAGNOSIS — Z880 Allergy status to penicillin: Secondary | ICD-10-CM | POA: Insufficient documentation

## 2013-02-03 DIAGNOSIS — Z9851 Tubal ligation status: Secondary | ICD-10-CM | POA: Insufficient documentation

## 2013-02-03 DIAGNOSIS — Z794 Long term (current) use of insulin: Secondary | ICD-10-CM | POA: Insufficient documentation

## 2013-02-03 DIAGNOSIS — R112 Nausea with vomiting, unspecified: Secondary | ICD-10-CM | POA: Insufficient documentation

## 2013-02-03 HISTORY — DX: Unspecified cataract: H26.9

## 2013-02-03 LAB — POCT I-STAT 3, VENOUS BLOOD GAS (G3P V)
Acid-Base Excess: 3 mmol/L — ABNORMAL HIGH (ref 0.0–2.0)
Bicarbonate: 26 mEq/L — ABNORMAL HIGH (ref 20.0–24.0)
O2 Saturation: 35 %
Patient temperature: 98.1
TCO2: 27 mmol/L (ref 0–100)
pCO2, Ven: 33.9 mmHg — ABNORMAL LOW (ref 45.0–50.0)
pH, Ven: 7.492 — ABNORMAL HIGH (ref 7.250–7.300)
pO2, Ven: 19 mmHg — CL (ref 30.0–45.0)

## 2013-02-03 LAB — GLUCOSE, CAPILLARY
Glucose-Capillary: 298 mg/dL — ABNORMAL HIGH (ref 70–99)
Glucose-Capillary: 328 mg/dL — ABNORMAL HIGH (ref 70–99)

## 2013-02-03 LAB — URINE MICROSCOPIC-ADD ON

## 2013-02-03 LAB — COMPREHENSIVE METABOLIC PANEL
AST: 18 U/L (ref 0–37)
Albumin: 3.5 g/dL (ref 3.5–5.2)
Alkaline Phosphatase: 110 U/L (ref 39–117)
BUN: 21 mg/dL (ref 6–23)
Potassium: 3.7 mEq/L (ref 3.5–5.1)
Sodium: 134 mEq/L — ABNORMAL LOW (ref 135–145)
Total Protein: 7.2 g/dL (ref 6.0–8.3)

## 2013-02-03 LAB — CBC WITH DIFFERENTIAL/PLATELET
Basophils Absolute: 0.1 10*3/uL (ref 0.0–0.1)
Basophils Relative: 1 % (ref 0–1)
Eosinophils Absolute: 0.1 10*3/uL (ref 0.0–0.7)
MCH: 27 pg (ref 26.0–34.0)
MCHC: 34.5 g/dL (ref 30.0–36.0)
Monocytes Relative: 4 % (ref 3–12)
Neutro Abs: 3.7 10*3/uL (ref 1.7–7.7)
Neutrophils Relative %: 40 % — ABNORMAL LOW (ref 43–77)
Platelets: 246 10*3/uL (ref 150–400)
RDW: 13.1 % (ref 11.5–15.5)

## 2013-02-03 LAB — URINALYSIS, ROUTINE W REFLEX MICROSCOPIC
Bilirubin Urine: NEGATIVE
Glucose, UA: >1000 mg/dL — AB
Leukocytes, UA: NEGATIVE
Nitrite: NEGATIVE
Specific Gravity, Urine: 1.028 (ref 1.005–1.030)
pH: 6 (ref 5.0–8.0)

## 2013-02-03 LAB — LIPASE, BLOOD: Lipase: 34 U/L (ref 11–59)

## 2013-02-03 MED ORDER — LORAZEPAM 2 MG/ML IJ SOLN
1.0000 mg | Freq: Once | INTRAMUSCULAR | Status: AC
  Administered 2013-02-03: 1 mg via INTRAVENOUS
  Filled 2013-02-03: qty 1

## 2013-02-03 MED ORDER — SODIUM CHLORIDE 0.9 % IV BOLUS (SEPSIS)
1000.0000 mL | Freq: Once | INTRAVENOUS | Status: AC
  Administered 2013-02-03: 1000 mL via INTRAVENOUS

## 2013-02-03 MED ORDER — ONDANSETRON 4 MG PO TBDP
8.0000 mg | ORAL_TABLET | Freq: Once | ORAL | Status: AC
  Administered 2013-02-03: 8 mg via ORAL
  Filled 2013-02-03: qty 2

## 2013-02-03 MED ORDER — INSULIN ASPART 100 UNIT/ML ~~LOC~~ SOLN
5.0000 [IU] | Freq: Once | SUBCUTANEOUS | Status: AC
  Administered 2013-02-03: 5 [IU] via INTRAVENOUS

## 2013-02-03 MED ORDER — INSULIN ASPART 100 UNIT/ML ~~LOC~~ SOLN
5.0000 [IU] | Freq: Once | SUBCUTANEOUS | Status: DC
  Filled 2013-02-03: qty 1

## 2013-02-03 MED ORDER — INSULIN ASPART 100 UNIT/ML ~~LOC~~ SOLN
4.0000 [IU] | Freq: Once | SUBCUTANEOUS | Status: AC
  Administered 2013-02-03: 4 [IU] via SUBCUTANEOUS
  Filled 2013-02-03: qty 1

## 2013-02-03 NOTE — ED Provider Notes (Signed)
CSN: [id number]     Arrival date & time 02/03/13  1923 History     First MD Initiated Contact with Patient 02/03/13 2016     Chief Complaint  Patient presents with  . Emesis   (Consider location/radiation/quality/duration/timing/severity/associated sxs/prior Treatment) HPI Debbie [name] 52 y.o. who is poorly controlled diabetic presents with nausea, vomiting, shortness of breath. Symptoms started early this afternoon. She's had intractable vomiting. Moderate in severity. Nonbilious nonbloody. No known exacerbating or relieving factors. Vomiting started in her home right after dinner. She's also been increasingly more anxious. Shortness of breath began worsening after the nausea and vomiting. No known exacerbating or relieving factors the shortness of breath. Denies fevers, chills, chest pain.  Past Medical History  Diagnosis Date  . Type II or unspecified type diabetes mellitus with unspecified complication, uncontrolled     x ~ 10 years  . Hypertension     x ~ 10 years  . Hypothyroidism   . Chest pain     a. ? MI in 2010, [place]->medically managed  . Diabetic foot ulcer   . Obesity   . Cataract    Past Surgical History  Procedure Laterality Date  . Back surgery    . Tubal ligation     Family History  Problem Relation Age of Onset  . Diabetes Father     died in his 3's?  . Diabetes Father     died in her 16's?  . Diabetes Sister     alive and well.   History  Substance Use Topics  . Smoking status: Never Smoker   . Smokeless tobacco: Not on file  . Alcohol Use: No   OB History   Grav Para Term Preterm Abortions TAB SAB Ect Mult Living                 Review of Systems  Constitutional: Negative for fever, chills, diaphoresis, activity change and appetite change.  HENT: Negative for sore throat, rhinorrhea, sneezing, drooling and trouble swallowing.   Eyes: Negative for discharge and redness.  Respiratory: Positive for shortness of breath. Negative for cough,  chest tightness, wheezing and stridor.   Cardiovascular: Negative for chest pain and leg swelling.  Gastrointestinal: Positive for nausea and vomiting. Negative for abdominal pain, diarrhea, constipation and blood in stool.  Genitourinary: Negative for difficulty urinating.  Musculoskeletal: Negative for myalgias and arthralgias.  Skin: Negative for pallor.  Neurological: Positive for headaches. Negative for dizziness, syncope, speech difficulty, weakness and light-headedness.  Hematological: Negative for adenopathy. Does not bruise/bleed easily.  Psychiatric/Behavioral: Negative for confusion and agitation. The patient is nervous/anxious.     Allergies  Penicillins  Home Medications   Current Outpatient Rx  Name  Route  Sig  Dispense  Refill  . acetaminophen (TYLENOL) 325 MG tablet   Oral   Take 2 tablets (650 mg total) by mouth every 6 (six) hours as needed.         . insulin aspart (NOVOLOG) 100 unit/ml SOLN   Subcutaneous   Inject 20 Units into the skin 3 (three) times daily with meals.   1 pen   5   . insulin glargine (LANTUS) 100 UNIT/ML injection   Subcutaneous   Inject 20 Units into the skin at bedtime.         . Insulin Pen Needle (COMFORT EZ PEN NEEDLES) 32G X 4 MM MISC   Does not apply   1 Container by Does not apply route 3 (three) times daily before meals.  1 each   11   . lisinopril (PRINIVIL,ZESTRIL) 5 MG tablet   Oral   Take 1 tablet (5 mg total) by mouth daily.   30 tablet   5   . Loteprednol Etabonate 0.5 % GEL   Ophthalmic   Apply 1 Container to eye 2 (two) times daily.   1 Bottle   3   . neomycin-polymyxin-pramoxine (NEOSPORIN PLUS) 1 % cream   Topical   Apply 1 application topically 2 (two) times daily.         Marland Kitchen senna (SENOKOT) 8.6 MG TABS   Oral   Take 2 tablets (17.2 mg total) by mouth at bedtime as needed (for constipation).   30 each   0   . sertraline (ZOLOFT) 25 MG tablet   Oral   Take 2 tablets (50 mg total) by mouth  daily.   60 tablet   5   . diazepam (VALIUM) 2 MG tablet   Oral   Take 1 tablet (2 mg total) by mouth every 8 (eight) hours as needed for anxiety.   12 tablet   0    BP 159/87  Pulse 79  Temp(Src) 98.1 F (36.7 C) (Oral)  Resp 20  SpO2 99%  LMP 12/13/2012 Physical Exam  Constitutional: She is oriented to person, place, and time. She appears well-developed and well-nourished. She appears distressed.  HENT:  Head: Normocephalic and atraumatic.  Right Ear: External ear normal.  Left Ear: External ear normal.  Eyes: Conjunctivae and EOM are normal. Right eye exhibits no discharge. Left eye exhibits no discharge.  Neck: Normal range of motion. Neck supple. No JVD present.  Cardiovascular: Normal rate, regular rhythm and normal heart sounds.  Exam reveals no gallop and no friction rub.   No murmur heard. Pulmonary/Chest: Effort normal and breath sounds normal. No stridor. No respiratory distress. She has no wheezes. She has no rales. She exhibits no tenderness.  Abdominal: Soft. Bowel sounds are normal. She exhibits no distension. There is no tenderness. There is no rebound and no guarding.  Musculoskeletal: Normal range of motion. She exhibits no edema.  Neurological: She is alert and oriented to person, place, and time.  Skin: Skin is warm. No rash noted. She is not diaphoretic.  Psychiatric: Her behavior is normal. Her mood appears anxious.    ED Course   Procedures (including critical care time)  Labs Reviewed  GLUCOSE, CAPILLARY - Abnormal; Notable for the following:    Glucose-Capillary 298 (*)    All other components within normal limits  CBC WITH DIFFERENTIAL - Abnormal; Notable for the following:    RBC 5.49 (*)    Neutrophils Relative % 40 (*)    Lymphocytes Relative 54 (*)    Lymphs Abs 4.9 (*)    All other components within normal limits  COMPREHENSIVE METABOLIC PANEL - Abnormal; Notable for the following:    Sodium 134 (*)    Glucose, Bld 344 (*)    All  other components within normal limits  URINALYSIS, ROUTINE W REFLEX MICROSCOPIC - Abnormal; Notable for the following:    Color, Urine STRAW (*)    APPearance CLOUDY (*)    Glucose, UA >1000 (*)    Ketones, ur 15 (*)    All other components within normal limits  GLUCOSE, CAPILLARY - Abnormal; Notable for the following:    Glucose-Capillary 328 (*)    All other components within normal limits  URINE MICROSCOPIC-ADD ON - Abnormal; Notable for the following:  Squamous Epithelial / LPF MANY (*)    Bacteria, UA MANY (*)    All other components within normal limits  GLUCOSE, CAPILLARY - Abnormal; Notable for the following:    Glucose-Capillary 233 (*)    All other components within normal limits  POCT I-STAT 3, BLOOD GAS (G3P V) - Abnormal; Notable for the following:    pH, Ven 7.492 (*)    pCO2, Ven 33.9 (*)    pO2, Ven 19.0 (*)    Bicarbonate 26.0 (*)    Acid-Base Excess 3.0 (*)    All other components within normal limits  LIPASE, BLOOD  BLOOD GAS, VENOUS   Results for orders placed during the hospital encounter of 02/03/13  GLUCOSE, CAPILLARY      Result Value Range   Glucose-Capillary 298 (*) 70 - 99 mg/dL   Comment 1 Notify RN     Comment 2 Documented in Chart    CBC WITH DIFFERENTIAL      Result Value Range   WBC 9.1  4.0 - 10.5 K/uL   RBC 5.49 (*) 3.87 - 5.11 MIL/uL   Hemoglobin 14.8  12.0 - 15.0 g/dL   HCT 78.2  95.6 - 21.3 %   MCV 78.1  78.0 - 100.0 fL   MCH 27.0  26.0 - 34.0 pg   MCHC 34.5  30.0 - 36.0 g/dL   RDW 08.6  57.8 - 46.9 %   Platelets 246  150 - 400 K/uL   Neutrophils Relative % 40 (*) 43 - 77 %   Neutro Abs 3.7  1.7 - 7.7 K/uL   Lymphocytes Relative 54 (*) 12 - 46 %   Lymphs Abs 4.9 (*) 0.7 - 4.0 K/uL   Monocytes Relative 4  3 - 12 %   Monocytes Absolute 0.4  0.1 - 1.0 K/uL   Eosinophils Relative 1  0 - 5 %   Eosinophils Absolute 0.1  0.0 - 0.7 K/uL   Basophils Relative 1  0 - 1 %   Basophils Absolute 0.1  0.0 - 0.1 K/uL  COMPREHENSIVE METABOLIC  PANEL      Result Value Range   Sodium 134 (*) 135 - 145 mEq/L   Potassium 3.7  3.5 - 5.1 mEq/L   Chloride 98  96 - 112 mEq/L   CO2 26  19 - 32 mEq/L   Glucose, Bld 344 (*) 70 - 99 mg/dL   BUN 21  6 - 23 mg/dL   Creatinine, Ser 6.29  0.50 - 1.10 mg/dL   Calcium 9.7  8.4 - 52.8 mg/dL   Total Protein 7.2  6.0 - 8.3 g/dL   Albumin 3.5  3.5 - 5.2 g/dL   AST 18  0 - 37 U/L   ALT 34  0 - 35 U/L   Alkaline Phosphatase 110  39 - 117 U/L   Total Bilirubin 0.4  0.3 - 1.2 mg/dL   GFR calc non Af Amer >90  >90 mL/min   GFR calc Af Amer >90  >90 mL/min  LIPASE, BLOOD      Result Value Range   Lipase 34  11 - 59 U/L  URINALYSIS, ROUTINE W REFLEX MICROSCOPIC      Result Value Range   Color, Urine STRAW (*) YELLOW   APPearance CLOUDY (*) CLEAR   Specific Gravity, Urine 1.028  1.005 - 1.030   pH 6.0  5.0 - 8.0   Glucose, UA >1000 (*) NEGATIVE mg/dL   Hgb urine dipstick NEGATIVE  NEGATIVE  Bilirubin Urine NEGATIVE  NEGATIVE   Ketones, ur 15 (*) NEGATIVE mg/dL   Protein, ur NEGATIVE  NEGATIVE mg/dL   Urobilinogen, UA 0.2  0.0 - 1.0 mg/dL   Nitrite NEGATIVE  NEGATIVE   Leukocytes, UA NEGATIVE  NEGATIVE  GLUCOSE, CAPILLARY      Result Value Range   Glucose-Capillary 328 (*) 70 - 99 mg/dL  URINE MICROSCOPIC-ADD ON      Result Value Range   Squamous Epithelial / LPF MANY (*) RARE   WBC, UA 0-2  <3 WBC/hpf   RBC / HPF 0-2  <3 RBC/hpf   Bacteria, UA MANY (*) RARE   Urine-Other MANY YEAST    GLUCOSE, CAPILLARY      Result Value Range   Glucose-Capillary 233 (*) 70 - 99 mg/dL  POCT I-STAT 3, BLOOD GAS (G3P V)      Result Value Range   pH, Ven 7.492 (*) 7.250 - 7.300   pCO2, Ven 33.9 (*) 45.0 - 50.0 mmHg   pO2, Ven 19.0 (*) 30.0 - 45.0 mmHg   Bicarbonate 26.0 (*) 20.0 - 24.0 mEq/L   TCO2 27  0 - 100 mmol/L   O2 Saturation 35.0     Acid-Base Excess 3.0 (*) 0.0 - 2.0 mmol/L   Patient temperature 98.1 F     Sample type VENOUS     Comment NOTIFIED PHYSICIAN      Dg Chest 2  View  02/03/2013   *RADIOLOGY REPORT*  Clinical Data: Emesis.  Short of breath.  Chest pain.  CHEST - 2 VIEW  Comparison: 01/16/2013.  Findings: Low volume chest.  Cardiopericardial silhouette and mediastinal contours appear within normal limits.  Basilar atelectasis.  No gross consolidation. Lateral view degraded by respiratory motion artifact.  IMPRESSION: Low volume chest.   Original Report Authenticated By: Andreas Newport, M.D.   1. Anxiety   2. Nausea & vomiting   3. Hyperglycemia     MDM  Yumalay Kirksey 52 y.o. presents for nausea, vomiting and diarrhea with associated shortness of breath. Patient has been seen multiple times here for similar symptoms, which is only yielded poorly controlled diabetes and no other etiology of her symptoms. She's had multiple CT PE studies, myocardial perfusion studies and other imaging there has not been revealing an etiology of her symptoms. She is afebrile. Vital signs are stable. He mechanically stable. She appears very anxious. Mood appropriate air bilaterally without any wheezes, or crackles. No accessory muscle usage. No signs or symptoms of peritonitis. Chest x-ray negative for pneumothorax or pneumonia. Venous blood gas shows a pH of 7.4 and PCO2 of 34. Doubt DKA. Hemoglobin stable. White blood cell count within normal limits. Blood sugar elevated, 328. Patient given insulin IV and subcutaneous for hyperglycemia. She is also given Ativan for anxiety. Will reevaluate.  On reevaluation patient is very comfortable. No respiratory distress. No Longer appears anxious. Denies nausea. Reports being asymptomatic at this time. Blood sugar now 230. Symptoms likely related to anxiety. Doubt PE at this time, as patient has had multiple CT Ace of her chest which were negative, CT mechanically stable, vital signs are within normal limits, and symptoms have now resolved. She was instructed to followup with her doctor in the next 2 days for further management of her symptoms.  She was discharged home with a short course of anxiolytics. She was given strong return precautions.  Labs and imaging reviewed. I discussed this patient's care with my attending.    Sena Hitch, MD 02/04/13 [phone]

## 2013-02-03 NOTE — ED Notes (Signed)
PT. REPORTS NAUSEA , VOMITTING AND DIARRHEA WITH SOB ONSET THIS TODAY , DENIES FEVER OR CHILLS , BILATERAL LEG PAIN . FRIENDS TRANSLATING  ( ARABIC) FOR PT.

## 2013-02-03 NOTE — ED Notes (Signed)
Pt hyperventilating. placed on 2 L o2 for comfort.

## 2013-02-04 LAB — GLUCOSE, CAPILLARY: Glucose-Capillary: 233 mg/dL — ABNORMAL HIGH (ref 70–99)

## 2013-02-04 MED ORDER — DIAZEPAM 2 MG PO TABS: 2.0000 mg | ORAL_TABLET | Freq: Three times a day (TID) | ORAL | Status: DC | PRN

## 2013-02-04 NOTE — ED Provider Notes (Signed)
I saw and evaluated the patient, reviewed the resident's note and I agree with the findings and plan.  Pt with numerous visits to the ED, several admissions since coming to [facility] in April.  Pt with similar present ion with anxiety, SOB, nausea and vomiting.  Abd soft, tachypnea, but no wheezing, stridor.  No evidence of DKA, pH is normal.  Will give BZN for likely anxiety causing SOB.  No vomiting seen in the ED here.  Pt is starting to establish with primary care now, has both a PCP and has been seeing an endocrinologist as well.  Family is ok taking pt home.  Encouraged close follow up.      Gavin Pound. Oletta Lamas, MD 02/04/13 [phone]

## 2013-02-05 ENCOUNTER — Telehealth: Payer: Self-pay

## 2013-02-05 NOTE — Telephone Encounter (Signed)
02/05/13 Message left  To Heather via telephone that per Dr. Laural Benes wait to see pt. In clinic on Thursday before consider adjusting medication. P.Darrian Grzelak,RN BSN M[facility]

## 2013-02-05 NOTE — Telephone Encounter (Signed)
Please let Herbert Seta know that we appreciate her concern but we would rather wait to see pt in the office on Thursday before we would consider adjusting her blood pressure medications and it would be unlikely that we would adjust medications like that over the telephone.

## 2013-02-05 NOTE — Telephone Encounter (Signed)
[facility] calling With concerns of patients blood pressure Has been running 140/92 Has an appt. Thursday to be seen Did you want to adjust her b/p medication? Heather #  [phone]

## 2013-02-07 ENCOUNTER — Ambulatory Visit: Payer: Medicaid Other | Attending: Family Medicine | Admitting: Family Medicine

## 2013-02-07 VITALS — BP 144/61 | HR 78 | Temp 98.0°F | Resp 18

## 2013-02-07 DIAGNOSIS — E1159 Type 2 diabetes mellitus with other circulatory complications: Secondary | ICD-10-CM

## 2013-02-07 DIAGNOSIS — F411 Generalized anxiety disorder: Secondary | ICD-10-CM

## 2013-02-07 DIAGNOSIS — E11319 Type 2 diabetes mellitus with unspecified diabetic retinopathy without macular edema: Secondary | ICD-10-CM

## 2013-02-07 DIAGNOSIS — E1339 Other specified diabetes mellitus with other diabetic ophthalmic complication: Secondary | ICD-10-CM

## 2013-02-07 DIAGNOSIS — R2681 Unsteadiness on feet: Secondary | ICD-10-CM

## 2013-02-07 DIAGNOSIS — I739 Peripheral vascular disease, unspecified: Secondary | ICD-10-CM

## 2013-02-07 DIAGNOSIS — R269 Unspecified abnormalities of gait and mobility: Secondary | ICD-10-CM

## 2013-02-07 DIAGNOSIS — E039 Hypothyroidism, unspecified: Secondary | ICD-10-CM

## 2013-02-07 DIAGNOSIS — E083499 Diabetes mellitus due to underlying condition with severe nonproliferative diabetic retinopathy without macular edema, unspecified eye: Secondary | ICD-10-CM

## 2013-02-07 DIAGNOSIS — R4182 Altered mental status, unspecified: Secondary | ICD-10-CM

## 2013-02-07 DIAGNOSIS — F4310 Post-traumatic stress disorder, unspecified: Secondary | ICD-10-CM

## 2013-02-07 DIAGNOSIS — E1151 Type 2 diabetes mellitus with diabetic peripheral angiopathy without gangrene: Secondary | ICD-10-CM

## 2013-02-07 DIAGNOSIS — I10 Essential (primary) hypertension: Secondary | ICD-10-CM

## 2013-02-07 NOTE — Progress Notes (Signed)
Patient ID: Debbie [name], female   DOB: [birth year], 52 y.o.   MRN: [id number]  CC:  Follow up   HPI: Pt rreporting that she is having BS readings in the 200 range to 300 range.  She is taking 20 units of novolog insulin TIDAC and 20 units of lantus at night.  Pt was taken off zoloft and had a panic event but now taking zoloft again.   Allergies  Allergen Reactions  . Penicillins Rash   Past Medical History  Diagnosis Date  . Type II or unspecified type diabetes mellitus with unspecified complication, uncontrolled     x ~ 10 years  . Hypertension     x ~ 10 years  . Hypothyroidism   . Chest pain     a. ? MI in 2010, [place]->medically managed  . Diabetic foot ulcer   . Obesity   . Cataract    Current Outpatient Prescriptions on File Prior to Visit  Medication Sig Dispense Refill  . acetaminophen (TYLENOL) 325 MG tablet Take 2 tablets (650 mg total) by mouth every 6 (six) hours as needed.      . diazepam (VALIUM) 2 MG tablet Take 1 tablet (2 mg total) by mouth every 8 (eight) hours as needed for anxiety.  12 tablet  0  . insulin aspart (NOVOLOG) 100 unit/ml SOLN Inject 20 Units into the skin 3 (three) times daily with meals.  1 pen  5  . insulin glargine (LANTUS) 100 UNIT/ML injection Inject 20 Units into the skin at bedtime.      . Insulin Pen Needle (COMFORT EZ PEN NEEDLES) 32G X 4 MM MISC 1 Container by Does not apply route 3 (three) times daily before meals.  1 each  11  . lisinopril (PRINIVIL,ZESTRIL) 5 MG tablet Take 1 tablet (5 mg total) by mouth daily.  30 tablet  5  . Loteprednol Etabonate 0.5 % GEL Apply 1 Container to eye 2 (two) times daily.  1 Bottle  3  . neomycin-polymyxin-pramoxine (NEOSPORIN PLUS) 1 % cream Apply 1 application topically 2 (two) times daily.      Marland Kitchen senna (SENOKOT) 8.6 MG TABS Take 2 tablets (17.2 mg total) by mouth at bedtime as needed (for constipation).  30 each  0  . sertraline (ZOLOFT) 25 MG tablet Take 2 tablets (50 mg total) by mouth daily.  60  tablet  5   No current facility-administered medications on file prior to visit.   Family History  Problem Relation Age of Onset  . Diabetes Father     died in his 64's?  . Diabetes Father     died in her 39's?  . Diabetes Sister     alive and well.   History   Social History  . Marital Status: Single    Spouse Name: N/A    Number of Children: N/A  . Years of Education: N/A   Occupational History  . Not on file.   Social History Main Topics  . Smoking status: Never Smoker   . Smokeless tobacco: Not on file  . Alcohol Use: No  . Drug Use: No  . Sexually Active: No   Other Topics Concern  . Not on file   Social History Narrative   From [place], [place].  Moved to [place] to live with sister in April of 2014.  She does not routinely exercise.    Review of Systems  Constitutional: Negative for fever, chills, diaphoresis, activity change, appetite change and fatigue.  HENT: Negative  for ear pain, nosebleeds, congestion, facial swelling, rhinorrhea, neck pain, neck stiffness and ear discharge.   Eyes: Negative for pain, discharge, redness, itching and visual disturbance.  Respiratory: Negative for cough, choking, chest tightness, shortness of breath, wheezing and stridor.   Cardiovascular: Negative for chest pain, palpitations and leg swelling.  Gastrointestinal: Negative for abdominal distention.  Genitourinary: Negative for dysuria, urgency, frequency, hematuria, flank pain, decreased urine volume, difficulty urinating and dyspareunia.  Musculoskeletal: Negative for back pain, joint swelling, arthralgias and gait problem.  Neurological: Negative for dizziness, tremors, seizures, syncope, facial asymmetry, speech difficulty, weakness, light-headedness, numbness and headaches.  Hematological: Negative for adenopathy. Does not bruise/bleed easily.  Psychiatric/Behavioral: Negative for hallucinations, behavioral problems, confusion, dysphoric mood, decreased concentration and  agitation.    Objective:   Filed Vitals:   02/07/13 1528  BP: 144/61  Pulse: 78  Temp: 98 F (36.7 C)  Resp: 18    Physical Exam  Constitutional: Appears well-developed and well-nourished. No distress.  HENT: Normocephalic. External right and left ear normal. Oropharynx is clear and moist.  Eyes: Conjunctivae and EOM are normal. PERRLA, no scleral icterus.  Neck: Normal ROM. Neck supple. No JVD. No tracheal deviation. No thyromegaly.  CVS: RRR, S1/S2 +, no murmurs, no gallops, no carotid bruit.  Pulmonary: Effort and breath sounds normal, no stridor, rhonchi, wheezes, rales.  Abdominal: Soft. BS +,  no distension, tenderness, rebound or guarding.  Musculoskeletal: Normal range of motion. No edema and no tenderness.  Lymphadenopathy: No lymphadenopathy noted, cervical, inguinal. Neuro: Alert. Normal reflexes, muscle tone coordination. No cranial nerve deficit. Skin: Skin is warm and dry. No rash noted. Not diaphoretic. No erythema. No pallor.  Psychiatric: Normal mood and affect. Behavior, judgment, thought content normal.   Lab Results  Component Value Date   WBC 9.1 02/03/2013   HGB 14.8 02/03/2013   HCT 42.9 02/03/2013   MCV 78.1 02/03/2013   PLT 246 02/03/2013   Lab Results  Component Value Date   CREATININE 0.54 02/03/2013   BUN 21 02/03/2013   NA 134* 02/03/2013   K 3.7 02/03/2013   CL 98 02/03/2013   CO2 26 02/03/2013    Lab Results  Component Value Date   HGBA1C 11.5* 01/16/2013   Lipid Panel  No results found for this basename: chol, trig, hdl, cholhdl, vldl, ldlcalc       Assessment and plan:   Patient Active Problem List   Diagnosis Date Noted  . Anxiety 02/04/2013  . Nausea & vomiting 02/04/2013  . Multinodular goiter 01/28/2013  . Syncope 12/29/2012  . Dizziness 12/29/2012  . Altered mental status 12/29/2012  . PTSD (post-traumatic stress disorder) 12/13/2012  . Generalized anxiety disorder 12/13/2012  . Foot ulcer, right 12/13/2012  . Foot lesion  12/13/2012  . Polyneuropathy in diabetes(357.2) 12/13/2012  . Diabetic retinopathy 12/12/2012  . Diabetic foot ulcers 12/05/2012  . Diabetic foot infection 11/30/2012  . Unspecified hypothyroidism 11/30/2012  . Gait instability 11/30/2012  . Chronic low back pain 11/30/2012  . Adjustment disorder with mixed anxiety and depressed mood 11/10/2012  . Diabetes mellitus type 2 with peripheral artery disease 11/09/2012  . Type II or unspecified type diabetes mellitus with unspecified complication, uncontrolled 11/09/2012  . HTN (hypertension) 11/09/2012  . Tachypnea 11/09/2012   Increase lantus to 40 units, continue novolog 20 units TIDAC Monitor BS 4x per day Follow up with endocrinology Follow up with opthalmology for laser eye surgery  RTC in 1 month  The patient was given clear instructions to  go to ER or return to medical center if symptoms don't improve, worsen or new problems develop.  The patient verbalized understanding.  The patient was told to call to get any lab results if not heard anything in the next week.    Rodney Langton, MD, CDE, FAAFP [facility] [place], [place]

## 2013-02-07 NOTE — Patient Instructions (Addendum)
Hypoglycemia (Low Blood Sugar) Hypoglycemia is when the glucose (sugar) in your blood is too low. Hypoglycemia can happen for many reasons. It can happen to people with or without diabetes. Hypoglycemia can develop quickly and can be a medical emergency.  CAUSES  Having hypoglycemia does not mean that you will develop diabetes. Different causes include:  Missed or delayed meals or not enough carbohydrates eaten.  Medication overdose. This could be by accident or deliberate. If by accident, your medication may need to be adjusted or changed.  Exercise or increased activity without adjustments in carbohydrates or medications.  A nerve disorder that affects body functions like your heart rate, blood pressure and digestion (autonomic neuropathy).  A condition where the stomach muscles do not function properly (gastroparesis). Therefore, medications may not absorb properly.  The inability to recognize the signs of hypoglycemia (hypoglycemic unawareness).  Absorption of insulin  may be altered.  Alcohol consumption.  Pregnancy/menstrual cycles/postpartum. This may be due to hormones.  Certain kinds of tumors. This is very rare. SYMPTOMS   Sweating.  Hunger.  Dizziness.  Blurred vision.  Drowsiness.  Weakness.  Headache.  Rapid heart beat.  Shakiness.  Nervousness. DIAGNOSIS  Diagnosis is made by monitoring blood glucose in one or all of the following ways:  Fingerstick blood glucose monitoring.  Laboratory results. TREATMENT  If you think your blood glucose is low:  Check your blood glucose, if possible. If it is less than 70 mg/dl, take one of the following:  3-4 glucose tablets.   cup juice (prefer clear like apple).   cup "regular" soda pop.  1 cup milk.  -1 tube of glucose gel.  5-6 hard candies.  Do not over treat because your blood glucose (sugar) will only go too high.  Wait 15 minutes and recheck your blood glucose. If it is still less than  70 mg/dl (or below your target range), repeat treatment.  Eat a snack if it is more than one hour until your next meal. Sometimes, your blood glucose may go so low that you are unable to treat yourself. You may need someone to help you. You may even pass out or be unable to swallow. This may require you to get an injection of glucagon, which raises the blood glucose. HOME CARE INSTRUCTIONS  Check blood glucose as recommended by your caregiver.  Take medication as prescribed by your caregiver.  Follow your meal plan. Do not skip meals. Eat on time.  If you are going to drink alcohol, drink it only with meals.  Check your blood glucose before driving.  Check your blood glucose before and after exercise. If you exercise longer or different than usual, be sure to check blood glucose more frequently.  Always carry treatment with you. Glucose tablets are the easiest to carry.  Always wear medical alert jewelry or carry some form of identification that states that you have diabetes. This will alert people that you have diabetes. If you have hypoglycemia, they will have a better idea on what to do. SEEK MEDICAL CARE IF:   You are having problems keeping your blood sugar at target range.  You are having frequent episodes of hypoglycemia.  You feel you might be having side effects from your medicines.  You have symptoms of an illness that is not improving after 3-4 days.  You notice a change in vision or a new problem with your vision. SEEK IMMEDIATE MEDICAL CARE IF:   You are a family member or friend of a   person whose blood glucose goes below 70 mg/dl and is accompanied by:  Confusion.  A change in mental status.  The inability to swallow.  Passing out. Document Released: 06/27/2005 Document Revised: 09/19/2011 Document Reviewed: 10/24/2011 Exit[employer] Patient Information 2014 [employer]. Blood Sugar Monitoring, Adult GLUCOSE METERS FOR SELF-MONITORING OF BLOOD GLUCOSE  It is  important to be able to correctly measure your blood sugar (glucose). You can use a blood glucose monitor (a small battery-operated device) to check your glucose level at any time. This allows you and your caregiver to monitor your diabetes and to determine how well your treatment plan is working. The process of monitoring your blood glucose with a glucose meter is called self-monitoring of blood glucose (SMBG). When people with diabetes control their blood sugar, they have better health. To test for glucose with a typical glucose meter, place the disposable strip in the meter. Then place a small sample of blood on the "test strip." The test strip is coated with chemicals that combine with glucose in blood. The meter measures how much glucose is present. The meter displays the glucose level as a number. Several new models can record and store a number of test results. Some models can connect to personal computers to store test results or print them out.  Newer meters are often easier to use than older models. Some meters allow you to get blood from places other than your fingertip. Some new models have automatic timing, error codes, signals, or barcode readers to help with proper adjustment (calibration). Some meters have a large display screen or spoken instructions for people with visual impairments.  INSTRUCTIONS FOR USING GLUCOSE METERS  Wash your hands with soap and warm water, or clean the area with alcohol. Dry your hands completely.  Prick the side of your fingertip with a lancet (a sharp-pointed tool used by hand).  Hold the hand down and gently milk the finger until a small drop of blood appears. Catch the blood with the test strip.  Follow the instructions for inserting the test strip and using the SMBG meter. Most meters require the meter to be turned on and the test strip to be inserted before applying the blood sample.  Record the test result.  Read the instructions carefully for both  the meter and the test strips that go with it. Meter instructions are found in the user manual. Keep this manual to help you solve any problems that may arise. Many meters use "error codes" when there is a problem with the meter, the test strip, or the blood sample on the strip. You will need the manual to understand these error codes and fix the problem.  New devices are available such as laser lancets and meters that can test blood taken from "alternative sites" of the body, other than fingertips. However, you should use standard fingertip testing if your glucose changes rapidly. Also, use standard testing if:  You have eaten, exercised, or taken insulin in the past 2 hours.  You think your glucose is low.  You tend to not feel symptoms of low blood glucose (hypoglycemia).  You are ill or under stress.  Clean the meter as directed by the manufacturer.  Test the meter for accuracy as directed by the manufacturer.  Take your meter with you to your caregiver's office. This way, you can test your glucose in front of your caregiver to make sure you are using the meter correctly. Your caregiver can also take a sample of blood   to test using a routine lab method. If values on the glucose meter are close to the lab results, you and your caregiver will see that your meter is working well and you are using good technique. Your caregiver will advise you about what to do if the results do not match. FREQUENCY OF TESTING  Your caregiver will tell you how often you should check your blood glucose. This will depend on your type of diabetes, your current level of diabetes control, and your types of medicines. The following are general guidelines, but your care plan may be different. Record all your readings and the time of day you took them for review with your caregiver.   Diabetes type 1.  When you are using insulin with good diabetic control (either multiple daily injections or via a pump), you should  check your glucose 4 times a day.  If your diabetes is not well controlled, you may need to monitor more frequently, including before meals and 2 hours after meals, at bedtime, and occasionally between 2 a.m. and 3 a.m.  You should always check your glucose before a dose of insulin or before changing the rate on your insulin pump.  Diabetes type 2.  Guidelines for SMBG in diabetes type 2 are not as well defined.  If you are on insulin, follow the guidelines above.  If you are on medicines, but not insulin, and your glucose is not well controlled, you should test at least twice daily.  If you are not on insulin, and your diabetes is controlled with medicines or diet alone, you should test at least once daily, usually before breakfast.  A weekly profile will help your caregiver advise you on your care plan. The week before your visit, check your glucose before a meal and 2 hours after a meal at least daily. You may want to test before and after a different meal each day so you and your caregiver can tell how well controlled your blood sugars are throughout the course of a 24 hour period.  Gestational diabetes (diabetes during pregnancy).  Frequent testing is often necessary. Accurate timing is important.  If you are not on insulin, check your glucose 4 times a day. Check it before breakfast and 1 hour after the start of each meal.  If you are on insulin, check your glucose 6 times a day. Check it before each meal and 1 hour after the first bite of each meal.  General guidelines.  More frequent testing is required at the start of insulin treatment. Your caregiver will instruct you.  Test your glucose any time you suspect you have low blood sugar (hypoglycemia).  You should test more often when you change medicines, when you have unusual stress or illness, or in other unusual circumstances. OTHER THINGS TO KNOW ABOUT GLUCOSE METERS  Measurement Range. Most glucose meters are able to  read glucose levels over a broad range of values from as low as 0 to as high as 600 mg/dL. If you get an extremely high or low reading from your meter, you should first confirm it with another reading. Report very high or very low readings to your caregiver.  Whole Blood Glucose versus Plasma Glucose. Some older home glucose meters measure glucose in your whole blood. In a lab or when using some newer home glucose meters, the glucose is measured in your plasma (one component of blood). The difference can be important. It is important for you and your caregiver to know whether   your meter gives its results as "whole blood equivalent" or "plasma equivalent."  Display of High and Low Glucose Values. Part of learning how to operate a meter is understanding what the meter results mean. Know how high and low glucose concentrations are displayed on your meter.  Factors that Affect Glucose Meter Performance. The accuracy of your test results depends on many factors and varies depending on the brand and type of meter. These factors include:  Low red blood cell count (anemia).  Substances in your blood (such as uric acid, vitamin C, and others).  Environmental factors (temperature, humidity, altitude).  Name-brand versus generic test strips.  Calibration. Make sure your meter is set up properly. It is a good idea to do a calibration test with a control solution recommended by the manufacturer of your meter whenever you begin using a fresh bottle of test strips. This will help verify the accuracy of your meter.  Improperly stored, expired, or defective test strips. Keep your strips in a dry place with the lid on.  Soiled meter.  Inadequate blood sample. NEW TECHNOLOGIES FOR GLUCOSE TESTING Alternative site testing Some glucose meters allow testing blood from alternative sites. These include the:  Upper arm.  Forearm.  Base of the thumb.  Thigh. Sampling blood from alternative sites may be  desirable. However, it may have some limitations. Blood in the fingertips show changes in glucose levels more quickly than blood in other parts of the body. This means that alternative site test results may be different from fingertip test results, not because of the meter's ability to test accurately, but because the actual glucose concentration can be different.  Continuous Glucose Monitoring Devices to measure your blood glucose continuously are available, and others are in development. These methods can be more expensive than self-monitoring with a glucose meter. However, it is uncertain how effective and reliable these devices are. Your caregiver will advise you if this approach makes sense for you. IF BLOOD SUGARS ARE CONTROLLED, PEOPLE WITH DIABETES REMAIN HEALTHIER.  SMBG is an important part of the treatment plan of patients with diabetes mellitus. Below are reasons for using SMBG:   It confirms that your glucose is at a specific, healthy level.  It detects hypoglycemia and severe hyperglycemia.  It allows you and your caregiver to make adjustments in response to changes in lifestyle for individuals requiring medicine.  It determines the need for starting insulin therapy in temporary diabetes that happens during pregnancy (gestational diabetes). Document Released: 06/30/2003 Document Revised: 09/19/2011 Document Reviewed: 10/21/2010 Exit[employer] Patient Information 2014 [employer].  

## 2013-02-07 NOTE — Progress Notes (Signed)
Patient was seen in the ED last week Suffered from a panic attack Was having SOB headache Vomited States when she coughs she has some pain In her chest

## 2013-02-14 ENCOUNTER — Encounter (HOSPITAL_COMMUNITY): Payer: Self-pay | Admitting: Emergency Medicine

## 2013-02-14 ENCOUNTER — Telehealth: Payer: Self-pay | Admitting: Family Medicine

## 2013-02-14 ENCOUNTER — Emergency Department (HOSPITAL_COMMUNITY)
Admission: EM | Admit: 2013-02-14 | Discharge: 2013-02-15 | Disposition: A | Discharge status: Home / Self Care | Payer: Medicaid Other | Attending: Emergency Medicine | Admitting: Emergency Medicine

## 2013-02-14 ENCOUNTER — Emergency Department (HOSPITAL_COMMUNITY): Payer: Medicaid Other

## 2013-02-14 DIAGNOSIS — E119 Type 2 diabetes mellitus without complications: Secondary | ICD-10-CM

## 2013-02-14 DIAGNOSIS — F419 Anxiety disorder, unspecified: Secondary | ICD-10-CM

## 2013-02-14 DIAGNOSIS — Z8639 Personal history of other endocrine, nutritional and metabolic disease: Secondary | ICD-10-CM | POA: Insufficient documentation

## 2013-02-14 DIAGNOSIS — R51 Headache: Secondary | ICD-10-CM | POA: Insufficient documentation

## 2013-02-14 DIAGNOSIS — Z862 Personal history of diseases of the blood and blood-forming organs and certain disorders involving the immune mechanism: Secondary | ICD-10-CM | POA: Insufficient documentation

## 2013-02-14 DIAGNOSIS — E669 Obesity, unspecified: Secondary | ICD-10-CM | POA: Insufficient documentation

## 2013-02-14 DIAGNOSIS — L97509 Non-pressure chronic ulcer of other part of unspecified foot with unspecified severity: Secondary | ICD-10-CM | POA: Insufficient documentation

## 2013-02-14 DIAGNOSIS — I10 Essential (primary) hypertension: Secondary | ICD-10-CM | POA: Insufficient documentation

## 2013-02-14 DIAGNOSIS — M549 Dorsalgia, unspecified: Secondary | ICD-10-CM | POA: Insufficient documentation

## 2013-02-14 DIAGNOSIS — Z794 Long term (current) use of insulin: Secondary | ICD-10-CM | POA: Insufficient documentation

## 2013-02-14 DIAGNOSIS — R450 Nervousness: Secondary | ICD-10-CM | POA: Insufficient documentation

## 2013-02-14 DIAGNOSIS — E1169 Type 2 diabetes mellitus with other specified complication: Secondary | ICD-10-CM | POA: Insufficient documentation

## 2013-02-14 DIAGNOSIS — F411 Generalized anxiety disorder: Secondary | ICD-10-CM | POA: Insufficient documentation

## 2013-02-14 DIAGNOSIS — H269 Unspecified cataract: Secondary | ICD-10-CM | POA: Insufficient documentation

## 2013-02-14 DIAGNOSIS — Z880 Allergy status to penicillin: Secondary | ICD-10-CM | POA: Insufficient documentation

## 2013-02-14 DIAGNOSIS — R110 Nausea: Secondary | ICD-10-CM | POA: Insufficient documentation

## 2013-02-14 DIAGNOSIS — R630 Anorexia: Secondary | ICD-10-CM | POA: Insufficient documentation

## 2013-02-14 DIAGNOSIS — Z79899 Other long term (current) drug therapy: Secondary | ICD-10-CM | POA: Insufficient documentation

## 2013-02-14 DIAGNOSIS — R739 Hyperglycemia, unspecified: Secondary | ICD-10-CM

## 2013-02-14 LAB — COMPREHENSIVE METABOLIC PANEL
AST: 17 U/L (ref 0–37)
Albumin: 3.2 g/dL — ABNORMAL LOW (ref 3.5–5.2)
Calcium: 9.3 mg/dL (ref 8.4–10.5)
Chloride: 102 mEq/L (ref 96–112)
Creatinine, Ser: 0.57 mg/dL (ref 0.50–1.10)

## 2013-02-14 LAB — CBC WITH DIFFERENTIAL/PLATELET
Basophils Absolute: 0.1 10*3/uL (ref 0.0–0.1)
Basophils Relative: 1 % (ref 0–1)
Eosinophils Relative: 1 % (ref 0–5)
HCT: 42.3 % (ref 36.0–46.0)
MCHC: 35 g/dL (ref 30.0–36.0)
Monocytes Absolute: 0.1 10*3/uL (ref 0.1–1.0)
Neutro Abs: 3.9 10*3/uL (ref 1.7–7.7)
RDW: 13.3 % (ref 11.5–15.5)

## 2013-02-14 LAB — GLUCOSE, CAPILLARY: Glucose-Capillary: 393 mg/dL — ABNORMAL HIGH (ref 70–99)

## 2013-02-14 MED ORDER — SODIUM CHLORIDE 0.9 % IV BOLUS (SEPSIS)
1000.0000 mL | Freq: Once | INTRAVENOUS | Status: AC
  Administered 2013-02-14: 1000 mL via INTRAVENOUS

## 2013-02-14 MED ORDER — DIPHENHYDRAMINE HCL 50 MG/ML IJ SOLN
25.0000 mg | Freq: Once | INTRAMUSCULAR | Status: AC
  Administered 2013-02-14: 25 mg via INTRAVENOUS
  Filled 2013-02-14 (×2): qty 1

## 2013-02-14 MED ORDER — METOCLOPRAMIDE HCL 5 MG/ML IJ SOLN
10.0000 mg | Freq: Once | INTRAMUSCULAR | Status: AC
  Administered 2013-02-14: 10 mg via INTRAVENOUS
  Filled 2013-02-14: qty 2

## 2013-02-14 NOTE — ED Provider Notes (Signed)
CSN: [id number]     Arrival date & time 02/14/13  2120 History     First MD Initiated Contact with Patient 02/14/13 2129     Chief Complaint  Patient presents with  . Hyperglycemia   (Consider location/radiation/quality/duration/timing/severity/associated sxs/prior Treatment) Patient is a 52 y.o. female presenting with hyperglycemia. The history is provided by a relative and a caregiver.  Hyperglycemia Blood sugar level PTA:  500 Severity:  Moderate Onset quality:  Gradual Duration:  2 days Timing:  Constant Progression:  Unchanged Chronicity:  Recurrent Diabetes status:  Controlled with insulin Context: noncompliance   Relieved by:  None tried Ineffective treatments:  None tried Associated symptoms: fatigue and nausea   Associated symptoms: no abdominal pain, no chest pain, no diaphoresis, no dizziness, no fever, no shortness of breath and no vomiting     Past Medical History  Diagnosis Date  . Type II or unspecified type diabetes mellitus with unspecified complication, uncontrolled     x ~ 10 years  . Hypertension     x ~ 10 years  . Hypothyroidism   . Chest pain     a. ? MI in 2010, [place]->medically managed  . Diabetic foot ulcer   . Obesity   . Cataract    Past Surgical History  Procedure Laterality Date  . Back surgery    . Tubal ligation     Family History  Problem Relation Age of Onset  . Diabetes Father     died in his 56's?  . Diabetes Father     died in her 31's?  . Diabetes Sister     alive and well.   History  Substance Use Topics  . Smoking status: Never Smoker   . Smokeless tobacco: Not on file  . Alcohol Use: No   OB History   Grav Para Term Preterm Abortions TAB SAB Ect Mult Living                 Review of Systems  Constitutional: Positive for appetite change and fatigue. Negative for fever, chills and diaphoresis.  HENT: Negative for ear pain, congestion, sore throat, facial swelling, mouth sores, trouble swallowing, neck pain and  neck stiffness.   Eyes: Negative.   Respiratory: Negative for apnea, cough, chest tightness, shortness of breath and wheezing.   Cardiovascular: Negative for chest pain, palpitations and leg swelling.  Gastrointestinal: Positive for nausea. Negative for vomiting, abdominal pain, diarrhea and abdominal distention.  Genitourinary: Negative for hematuria, flank pain, vaginal discharge, difficulty urinating and menstrual problem.  Musculoskeletal: Positive for back pain. Negative for gait problem.  Skin: Negative for rash and wound.  Neurological: Positive for headaches. Negative for dizziness, tremors, seizures, syncope, facial asymmetry and numbness.  Psychiatric/Behavioral: Positive for dysphoric mood. The patient is nervous/anxious.   All other systems reviewed and are negative.    Allergies  Penicillins  Home Medications   Current Outpatient Rx  Name  Route  Sig  Dispense  Refill  . acetaminophen (TYLENOL) 325 MG tablet   Oral   Take 2 tablets (650 mg total) by mouth every 6 (six) hours as needed.         . diazepam (VALIUM) 2 MG tablet   Oral   Take 1 tablet (2 mg total) by mouth every 8 (eight) hours as needed for anxiety.   12 tablet   0   . insulin aspart (NOVOLOG) 100 unit/ml SOLN   Subcutaneous   Inject 20 Units into the skin 3 (three) times  daily with meals.   1 pen   5   . insulin glargine (LANTUS) 100 UNIT/ML injection   Subcutaneous   Inject 40 Units into the skin at bedtime.         Marland Kitchen lisinopril (PRINIVIL,ZESTRIL) 5 MG tablet   Oral   Take 1 tablet (5 mg total) by mouth daily.   30 tablet   5   . loteprednol (LOTEMAX) 0.5 % ophthalmic suspension   Both Eyes   Place 1 drop into both eyes 2 (two) times daily.         Marland Kitchen neomycin-polymyxin-pramoxine (NEOSPORIN PLUS) 1 % cream   Topical   Apply 1 application topically 2 (two) times daily.         . saxagliptin HCl (ONGLYZA) 2.5 MG TABS tablet   Oral   Take 2.5 mg by mouth 2 (two) times daily.          Marland Kitchen senna (SENOKOT) 8.6 MG TABS   Oral   Take 2 tablets (17.2 mg total) by mouth at bedtime as needed (for constipation).   30 each   0   . sertraline (ZOLOFT) 25 MG tablet   Oral   Take 2 tablets (50 mg total) by mouth daily.   60 tablet   5    BP 142/95  Temp(Src) 99.1 F (37.3 C) (Oral)  Resp 18  SpO2 100%  LMP 01/31/2013 Physical Exam  Nursing note and vitals reviewed. Constitutional: She appears well-developed and well-nourished. No distress.  HENT:  Head: Normocephalic and atraumatic.  Right Ear: External ear normal.  Left Ear: External ear normal.  Nose: Nose normal.  Mouth/Throat: Oropharynx is clear and moist. No oropharyngeal exudate.  Eyes: Conjunctivae and EOM are normal. Pupils are equal, round, and reactive to light. Right eye exhibits no discharge. Left eye exhibits no discharge.  Neck: Normal range of motion. Neck supple. No JVD present. No tracheal deviation present. No thyromegaly present.  Cardiovascular: Normal rate, regular rhythm, normal heart sounds and intact distal pulses.  Exam reveals no gallop and no friction rub.   No murmur heard. Pulmonary/Chest: Effort normal and breath sounds normal. No respiratory distress. She has no wheezes. She has no rales. She exhibits no tenderness.  Abdominal: Soft. Bowel sounds are normal. She exhibits no distension. There is no tenderness. There is no rebound and no guarding.  Musculoskeletal: Normal range of motion.  Lymphadenopathy:    She has no cervical adenopathy.  Neurological: She is alert. No cranial nerve deficit. Coordination normal.  Patient shaking right hand and screaming   Skin: Skin is warm. No rash noted. She is not diaphoretic.  Psychiatric: She is agitated.  Screaming and saying she is anxious.    ED Course   Procedures (including critical care time)  Labs Reviewed  GLUCOSE, CAPILLARY - Abnormal; Notable for the following:    Glucose-Capillary 393 (*)    All other components within  normal limits  CBC WITH DIFFERENTIAL - Abnormal; Notable for the following:    RBC 5.33 (*)    Lymphocytes Relative 50 (*)    Lymphs Abs 4.1 (*)    Monocytes Relative 1 (*)    All other components within normal limits  COMPREHENSIVE METABOLIC PANEL - Abnormal; Notable for the following:    Glucose, Bld 392 (*)    Albumin 3.2 (*)    All other components within normal limits  BLOOD GAS, VENOUS  URINALYSIS, ROUTINE W REFLEX MICROSCOPIC   Dg Chest Port 1 View  02/14/2013   *  RADIOLOGY REPORT*  Clinical Data: Hyperglycemia, altered mental status, chest pain  CHEST - 1 VIEW  Comparison:  02/03/2013  Findings: The heart size and mediastinal contours are within normal limits.  Both lungs are clear.  Slightly low lung volumes.  IMPRESSION: No active disease.   Original Report Authenticated By: Judie Petit. Shick, M.D.   1. Anxiety   2. Diabetes   3. Hyperglycemia     MDM  Pt w/ PMH of anxiety, PTSD, HTN, DM who often has anxiety attacks where she becomes non-communicative here screaming in pain and difficult to make communicate. She has been refusing to take insulin and other home medications. She does not leave the bed and defecates on herself. Family says she has not had fevers and has chronic pain in the back and recurring headaches but is refusing all medicines. Will check blood glucose and anion gap. Will help treat HA.   Migraine medicine took away and anxiety and headache according to the patient. She is now talking and saying she feels well. I asked if the family needed help from a social work stand point since she has been having trouble with anxiety and with taking her medicines. The patient does not endorse thoughts or plans of hurting herself or others and wants to go home. Family is okay with this plan. Patient with no anion gap and decreasing blood sugar. Family says patient is back to baseline. Will recommend that the patient follow up with outpatient psychiatric treatment.  Case discussed with  Dr. Elvin So, MD 02/14/13 [phone]

## 2013-02-14 NOTE — ED Notes (Addendum)
PT to ED via [facility] with c/o hyperglycemia.  EMS st's pt has not had any insulin or eaten in past 4 days.  Sister st's pt refuses to take meds or eat.  Pt constantly making loud sounds and hyperventilating.  Pt has hx of HTN but will not take meds.

## 2013-02-14 NOTE — Telephone Encounter (Signed)
02/14/13  Unable to speak with patient  Spoke with Debbie [name] friend of patient who stated that had been refusing Medication x 4 days and not eating. Instructed to take patient to Emergency Room for evaluation. P.Kevin Space,RN BSN [facility]

## 2013-02-14 NOTE — Telephone Encounter (Signed)
Please have patient come in tomorrow to see a provider to find out why she's not taking medications and to get some new insulin orders.

## 2013-02-14 NOTE — Telephone Encounter (Signed)
Heather form [facility] called to ask if their Social worker can be authorized to go to pt home for visit -[facility] Her contact number is [phone]

## 2013-02-14 NOTE — Telephone Encounter (Signed)
Debbie [name] ([facility]) calling on pt behalf regarding her health; pt refusing medication and sugar checks; needs orders for insulin when pt is not eating; sugar was 370; BP 170/105

## 2013-02-15 ENCOUNTER — Other Ambulatory Visit: Payer: Self-pay | Admitting: Family Medicine

## 2013-02-19 NOTE — ED Provider Notes (Signed)
I performed a history and physical examination of  Debbie [name] and discussed her management with Dr. Lew Dawes. I agree with the history, physical, assessment, and plan of care, with the following exceptions: None I was present for the following procedures: None  Time Spent in Critical Care of the patient: None  Time spent in discussions with the patient and family: 15 minutes  [name]  Pt with hx of anxiety, other psych problems, refuge from [place], increased stress in life currently due to some financial reasons, and so she became non verbal and stopped eating / drinking. Hx of same in the past. When i saw her she was moaning, not responding to any questions - and the plan was to admit, however, she turned around on her own and started requesting discharge. No ketosis with the hyperglycemia, stable for dc. Family and friend agree with the plan.  Derwood Kaplan, MD 02/19/13 [phone]

## 2013-03-04 ENCOUNTER — Telehealth: Payer: Self-pay | Admitting: Emergency Medicine

## 2013-03-08 ENCOUNTER — Telehealth: Payer: Self-pay | Admitting: Emergency Medicine

## 2013-03-08 NOTE — Telephone Encounter (Signed)
Home care nurse called to reschedule pt appt due to medicaid transportation not available. New appt next Friday @ 1145 am

## 2013-03-12 ENCOUNTER — Ambulatory Visit: Payer: Self-pay

## 2013-03-15 ENCOUNTER — Encounter: Payer: Self-pay | Admitting: Internal Medicine

## 2013-03-15 ENCOUNTER — Ambulatory Visit: Payer: Medicaid Other | Attending: Internal Medicine | Admitting: Internal Medicine

## 2013-03-15 VITALS — BP 167/97 | HR 95 | Temp 98.1°F | Resp 20

## 2013-03-15 DIAGNOSIS — E039 Hypothyroidism, unspecified: Secondary | ICD-10-CM | POA: Insufficient documentation

## 2013-03-15 DIAGNOSIS — F411 Generalized anxiety disorder: Secondary | ICD-10-CM | POA: Insufficient documentation

## 2013-03-15 DIAGNOSIS — Z993 Dependence on wheelchair: Secondary | ICD-10-CM | POA: Insufficient documentation

## 2013-03-15 DIAGNOSIS — F4323 Adjustment disorder with mixed anxiety and depressed mood: Secondary | ICD-10-CM

## 2013-03-15 DIAGNOSIS — E1165 Type 2 diabetes mellitus with hyperglycemia: Secondary | ICD-10-CM

## 2013-03-15 DIAGNOSIS — I10 Essential (primary) hypertension: Secondary | ICD-10-CM | POA: Insufficient documentation

## 2013-03-15 DIAGNOSIS — F3289 Other specified depressive episodes: Secondary | ICD-10-CM | POA: Insufficient documentation

## 2013-03-15 DIAGNOSIS — Z09 Encounter for follow-up examination after completed treatment for conditions other than malignant neoplasm: Secondary | ICD-10-CM | POA: Insufficient documentation

## 2013-03-15 DIAGNOSIS — Z794 Long term (current) use of insulin: Secondary | ICD-10-CM | POA: Insufficient documentation

## 2013-03-15 DIAGNOSIS — IMO0002 Reserved for concepts with insufficient information to code with codable children: Secondary | ICD-10-CM | POA: Insufficient documentation

## 2013-03-15 DIAGNOSIS — F329 Major depressive disorder, single episode, unspecified: Secondary | ICD-10-CM | POA: Insufficient documentation

## 2013-03-15 MED ORDER — LISINOPRIL-HYDROCHLOROTHIAZIDE 20-12.5 MG PO TABS: 1.0000 | ORAL_TABLET | Freq: Every day | ORAL | Status: DC

## 2013-03-15 MED ORDER — INSULIN GLARGINE 100 UNIT/ML ~~LOC~~ SOLN: 55.0000 [IU] | Freq: Every day | SUBCUTANEOUS | Status: DC

## 2013-03-15 MED ORDER — INSULIN ASPART 100 UNIT/ML ~~LOC~~ SOLN: 25.0000 [IU] | Freq: Three times a day (TID) | SUBCUTANEOUS | Status: DC

## 2013-03-15 NOTE — Addendum Note (Signed)
Addended by: Eddie North on: 03/15/2013 03:59 PM   Modules accepted: Orders

## 2013-03-15 NOTE — Progress Notes (Signed)
Patient ID: Debbie [name], female   DOB: [birth year], 52 y.o.   MRN: [id number] PCP:  Jeanann Lewandowsky, MD   DOA:  No admission date for patient encounter.  Chief Complaint:  Follow up  HPI: 52 year old female with history of uncontrolled type 2 diabetes, hypertension, hypothyroidism, obesity, cataract, ? Anxiety and depression, right-sided weakness for several years and wheelchair bound brought in by her sister for uncontrolled blood sugar. Reports that her blood was ranges from 280 to 350s. Patient is on Lantus and pre-meal NovoLog along with onglyza. Reports polyuria and polydipsia. She also has increased anxiety symptoms. Patient also feels excessively hot and sweaty. Has not had menstruation for 2 months. No fevers chills, headache, dizziness, palpitations, shortness of breath, chest pain, abdominal pain, bowel symptoms  Allergies: Allergies  Allergen Reactions  . Penicillins Rash    Prior to Admission medications   Medication Sig Start Date End Date Taking? Authorizing Provider  diazepam (VALIUM) 2 MG tablet Take 1 tablet (2 mg total) by mouth every 8 (eight) hours as needed for anxiety. 02/04/13  Yes Sena Hitch, MD  insulin aspart (NOVOLOG) 100 unit/ml SOLN Inject 20 Units into the skin 3 (three) times daily with meals. 01/09/13  Yes Dorothea Ogle, MD  insulin glargine (LANTUS) 100 UNIT/ML injection Inject 40 Units into the skin at bedtime.   Yes Historical Provider, MD  lisinopril (PRINIVIL,ZESTRIL) 5 MG tablet Take 1 tablet (5 mg total) by mouth daily. 01/09/13  Yes Dorothea Ogle, MD  loteprednol (LOTEMAX) 0.5 % ophthalmic suspension Place 1 drop into both eyes 2 (two) times daily.   Yes Historical Provider, MD  neomycin-polymyxin-pramoxine (NEOSPORIN PLUS) 1 % cream Apply 1 application topically 2 (two) times daily. 12/05/12  Yes Ripudeep Jenna Luo, MD  saxagliptin HCl (ONGLYZA) 2.5 MG TABS tablet Take 2.5 mg by mouth 2 (two) times daily.   Yes Historical Provider, MD  senna (SENOKOT) 8.6  MG TABS Take 2 tablets (17.2 mg total) by mouth at bedtime as needed (for constipation). 01/19/13  Yes Adeline Joselyn Glassman, MD  sertraline (ZOLOFT) 25 MG tablet Take 2 tablets (50 mg total) by mouth daily. 01/19/13  Yes Adeline Joselyn Glassman, MD  acetaminophen (TYLENOL) 325 MG tablet Take 2 tablets (650 mg total) by mouth every 6 (six) hours as needed. 01/02/13   Rhetta Mura, MD    Past Medical History  Diagnosis Date  . Type II or unspecified type diabetes mellitus with unspecified complication, uncontrolled     x ~ 10 years  . Hypertension     x ~ 10 years  . Hypothyroidism   . Chest pain     a. ? MI in 2010, [place]->medically managed  . Diabetic foot ulcer   . Obesity   . Cataract     Past Surgical History  Procedure Laterality Date  . Back surgery    . Tubal ligation      Social History:  reports that she has never smoked. She does not have any smokeless tobacco history on file. She reports that she does not drink alcohol or use illicit drugs.  Family History  Problem Relation Age of Onset  . Diabetes Father     died in his 57's?  . Diabetes Father     died in her 6's?  . Diabetes Sister     alive and well.    Review of Systems:     Physical Exam:  Filed Vitals:   03/15/13 1321  BP: 167/97  Pulse:  95  Temp: 98.1 F (36.7 C)  TempSrc: Oral  Resp: 20  SpO2: 99%   mutilated obese female in no acute distress HEENT: No pallor, moist oral mucosa Chest: Clear to auscultation bilaterally, no added sounds CVS: Normal S1 and S2, no murmurs or gallop Abdomen: Soft, nontender, nondistended Extremities: Fine resting tremors over her right hand CNS: Awake and alert  Labs on Admission:  No results found for this or any previous visit (from the past 48 hour(s)).  Radiological Exams on Admission: Dg Chest Port 1 View  02/14/2013   *RADIOLOGY REPORT*  Clinical Data: Hyperglycemia, altered mental status, chest pain  CHEST - 1 VIEW  Comparison:  02/03/2013  Findings:  The heart size and mediastinal contours are within normal limits.  Both lungs are clear.  Slightly low lung volumes.  IMPRESSION: No active disease.   Original Report Authenticated By: Judie Petit. Miles Costain, M.D.    Assessment/Plan Uncontrolled diabetes mellitus Fingersticks ranges from 280- 350s daily. I will increase her Lantus to 55 units daily at bedtime and pre-meal NovoLog  To 25 units 3 times daily with meals. To her hospital keep a log of the blood glucose monitor and bring it getting next visit.  Hypothyroidism C/o excessive sweating and fatigue. Will repeat TSH and free t4. not any medications.  Hypertension Blood pressure is elevated. I would place her on HCTZ-lisinopril 12.5-20 mg daily.  Anxiety and depression Refer to  outpatient psychiatry Continue Zoloft for now  [facility], Coreon Simkins 03/15/2013, 1:43 PM

## 2013-03-15 NOTE — Progress Notes (Signed)
Pt here with daughter. ARABIC interpretor present C/o uncontrolled blood sugars high 300's Need medication refills Paperwork for disability

## 2013-03-16 LAB — T4, FREE: Free T4: 1.34 ng/dL (ref 0.80–1.80)

## 2013-03-29 ENCOUNTER — Ambulatory Visit: Payer: Medicaid Other | Attending: Family Medicine | Admitting: Internal Medicine

## 2013-03-29 ENCOUNTER — Ambulatory Visit: Payer: Self-pay | Admitting: Internal Medicine

## 2013-03-29 NOTE — Progress Notes (Unsigned)
PT HERE WITH ARABIC TRANSLATOR/ SISTER. REFUSES VITAL SIGNS. SISTER STATES SHE IS VERY UPSET NEED PSYCHIATRIST REFERRAL  MEDICATION CHANGE

## 2013-03-29 NOTE — Progress Notes (Unsigned)
Patient ID: Debbie [name], female   DOB: 1961-06-08, 52 y.o.   MRN: [id number] Patient was here for a followup. I was told by the nurse that patient was upset for reasons unclear and left before I could see her.

## 2013-04-05 ENCOUNTER — Telehealth: Payer: Self-pay | Admitting: Emergency Medicine

## 2013-04-05 NOTE — Telephone Encounter (Signed)
Spoke with nurse from [facility] Tandy Gaw regarding pt diabetes control. Informed her Dr. Gonzella Lex increased pt Lantus/Novolog. Unsure of miscommunication from pt family.

## 2013-04-15 ENCOUNTER — Ambulatory Visit: Payer: Self-pay | Admitting: Internal Medicine

## 2013-04-18 ENCOUNTER — Ambulatory Visit: Payer: Medicaid Other | Attending: Internal Medicine | Admitting: Internal Medicine

## 2013-04-18 ENCOUNTER — Encounter: Payer: Self-pay | Admitting: Internal Medicine

## 2013-04-18 VITALS — BP 124/80 | HR 89 | Temp 98.4°F | Resp 16

## 2013-04-18 DIAGNOSIS — IMO0002 Reserved for concepts with insufficient information to code with codable children: Secondary | ICD-10-CM

## 2013-04-18 DIAGNOSIS — E119 Type 2 diabetes mellitus without complications: Secondary | ICD-10-CM

## 2013-04-18 DIAGNOSIS — E1165 Type 2 diabetes mellitus with hyperglycemia: Secondary | ICD-10-CM

## 2013-04-18 DIAGNOSIS — I10 Essential (primary) hypertension: Secondary | ICD-10-CM

## 2013-04-18 LAB — POCT GLYCOSYLATED HEMOGLOBIN (HGB A1C): Hemoglobin A1C: 14

## 2013-04-18 MED ORDER — BLOOD GLUCOSE TEST VI STRP: ORAL_STRIP | Status: DC

## 2013-04-18 MED ORDER — LISINOPRIL-HYDROCHLOROTHIAZIDE 20-12.5 MG PO TABS: 1.0000 | ORAL_TABLET | Freq: Every day | ORAL | Status: DC

## 2013-04-18 MED ORDER — INSULIN ASPART 100 UNIT/ML ~~LOC~~ SOLN: 25.0000 [IU] | Freq: Three times a day (TID) | SUBCUTANEOUS | Status: DC

## 2013-04-18 MED ORDER — INSULIN GLARGINE 100 UNIT/ML ~~LOC~~ SOLN: 55.0000 [IU] | Freq: Every day | SUBCUTANEOUS | Status: DC

## 2013-04-18 NOTE — Progress Notes (Signed)
Patient ID: Debbie [name], female   DOB: 06-Jul-1961, 52 y.o.   MRN: [id number] Patient Demographics  Debbie [name], is a 52 y.o. female  IEP:[id number]  [id number]:[id number]  DOB - 1960/12/12  Chief Complaint  Patient presents with  . Follow-up  . Hypertension  . Diabetes  . Depression        Subjective:   Debbie [name] is a 52 y.o. female here today for a follow up visit. She has history of uncontrolled type 2 diabetes, hypertension, hypothyroidism, obesity, cataract, Anxiety and depression, right-sided weakness for several years and wheelchair bound. She was brought in today by her sister and caregiver for followup of uncontrolled blood sugar. Her blood sugar this morning was 300, generally ranges between 250 and 350. Patient had an appointment with ophthalmology today for diabetic retinopathy screening but she did not allowed the exam to be done. She has no complaint today, she remains wheelchair bound, with right hand tremor. She is reported to have poor appetite. Patient has No headache, No chest pain, No abdominal pain - No Nausea, No new weakness tingling or numbness, No Cough - SOB.  ALLERGIES: Allergies  Allergen Reactions  . Penicillins Rash    PAST MEDICAL HISTORY: Past Medical History  Diagnosis Date  . Type II or unspecified type diabetes mellitus with unspecified complication, uncontrolled     x ~ 10 years  . Hypertension     x ~ 10 years  . Hypothyroidism   . Chest pain     a. ? MI in 2010, [place]->medically managed  . Diabetic foot ulcer   . Obesity   . Cataract     MEDICATIONS AT HOME: Prior to Admission medications   Medication Sig Start Date End Date Taking? Authorizing Provider  diazepam (VALIUM) 2 MG tablet Take 1 tablet (2 mg total) by mouth every 8 (eight) hours as needed for anxiety. 02/04/13  Yes Sena Hitch, MD  insulin aspart (NOVOLOG) 100 UNIT/ML injection Inject 25 Units into the skin 3 (three) times daily before meals. 04/18/13  Yes Jeanann Lewandowsky, MD  insulin glargine (LANTUS) 100 UNIT/ML injection Inject 0.55 mLs (55 Units total) into the skin at bedtime. 04/18/13  Yes Jeanann Lewandowsky, MD  lisinopril-hydrochlorothiazide (ZESTORETIC) 20-12.5 MG per tablet Take 1 tablet by mouth daily. 04/18/13  Yes Jeanann Lewandowsky, MD  saxagliptin HCl (ONGLYZA) 2.5 MG TABS tablet Take 2.5 mg by mouth 2 (two) times daily.   Yes Historical Provider, MD  senna (SENOKOT) 8.6 MG TABS Take 2 tablets (17.2 mg total) by mouth at bedtime as needed (for constipation). 01/19/13  Yes Adeline Joselyn Glassman, MD  acetaminophen (TYLENOL) 325 MG tablet Take 2 tablets (650 mg total) by mouth every 6 (six) hours as needed. 01/02/13   Rhetta Mura, MD  Glucose Blood (BLOOD GLUCOSE TEST STRIPS) STRP Use as directed 04/18/13   Jeanann Lewandowsky, MD  loteprednol (LOTEMAX) 0.5 % ophthalmic suspension Place 1 drop into both eyes 2 (two) times daily.    Historical Provider, MD  neomycin-polymyxin-pramoxine (NEOSPORIN PLUS) 1 % cream Apply 1 application topically 2 (two) times daily. 12/05/12   Ripudeep Jenna Luo, MD  sertraline (ZOLOFT) 25 MG tablet Take 2 tablets (50 mg total) by mouth daily. 01/19/13   [name], MD     Objective:   Filed Vitals:   04/18/13 1136  BP: 124/80  Pulse: 89  Temp: 98.4 F (36.9 C)  TempSrc: Oral  Resp: 16  SpO2: 97%    Exam General appearance : Awake, alert,  not in any distress. Non-verbal. Not toxic looking HEENT: Atraumatic and Normocephalic Neck: supple, no JVD. No cervical lymphadenopathy.  Chest:Good air entry bilaterally, no added sounds  CVS: S1 S2 regular, no murmurs.  Abdomen: Bowel sounds present, Non tender and not distended with no gaurding, rigidity or rebound. Extremities: Right hand tremor at rest, B/L Lower Ext shows no edema, both legs are warm to touch Neurology: On wheelchair  Wounds:N/A   Data Review   CBC No results found for this basename: WBC, HGB, HCT, PLT, MCV, MCH, MCHC, RDW, NEUTRABS,  LYMPHSABS, MONOABS, EOSABS, BASOSABS, BANDABS, BANDSABD,  in the last 168 hours  Chemistries   No results found for this basename: NA, K, CL, CO2, GLUCOSE, BUN, CREATININE, GFRCGP, CALCIUM, MG, AST, ALT, ALKPHOS, BILITOT,  in the last 168 hours ------------------------------------------------------------------------------------------------------------------  Recent Labs  04/18/13 1216  HGBA1C 14.0   ------------------------------------------------------------------------------------------------------------------ No results found for this basename: CHOL, HDL, LDLCALC, TRIG, CHOLHDL, LDLDIRECT,  in the last 72 hours ------------------------------------------------------------------------------------------------------------------ No results found for this basename: TSH, T4TOTAL, FREET3, T3FREE, THYROIDAB,  in the last 72 hours ------------------------------------------------------------------------------------------------------------------ No results found for this basename: VITAMINB12, FOLATE, FERRITIN, TIBC, IRON, RETICCTPCT,  in the last 72 hours  Coagulation profile  No results found for this basename: INR, PROTIME,  in the last 168 hours    Assessment & Plan   Patient Active Problem List   Diagnosis Date Noted  . Diabetes 04/18/2013  . Anxiety 02/04/2013  . Nausea & vomiting 02/04/2013  . Multinodular goiter 01/28/2013  . Syncope 12/29/2012  . Dizziness 12/29/2012  . Altered mental status 12/29/2012  . PTSD (post-traumatic stress disorder) 12/13/2012  . Generalized anxiety disorder 12/13/2012  . Foot lesion 12/13/2012  . Polyneuropathy in diabetes(357.2) 12/13/2012  . Diabetic retinopathy 12/12/2012  . Diabetic foot ulcers 12/05/2012  . Diabetic foot infection 11/30/2012  . Unspecified hypothyroidism 11/30/2012  . Gait instability 11/30/2012  . Chronic low back pain 11/30/2012  . Adjustment disorder with mixed anxiety and depressed mood 11/10/2012  . Diabetes mellitus  type 2 with peripheral artery disease 11/09/2012  . Type II or unspecified type diabetes mellitus with unspecified complication, uncontrolled 11/09/2012  . HTN (hypertension) 11/09/2012  . Tachypnea 11/09/2012     Plan:  Uncontrolled diabetes mellitus  Fingersticks ranges from 250- 350s daily. Hemoglobin A1c is about 14, will increase her Lantus from 40 units to 55 units daily at bedtime and pre-meal NovoLog To 25 units 3 times daily with meals from 20 units 3 times a day.  She has been encouraged to her sister and caregiver to maintain a log of blood sugar and bring to the next appointment  Hypothyroidism: Stable  Hypertension  Blood pressure is controlled today, we'll continue the same medication  Anxiety and depression  Continue Zoloft  Follow up in 4 weeks or when necessary  The patient was given clear instructions to go to ER or return to medical center if symptoms don't improve, worsen or new problems develop. The patient verbalized understanding. The patient was told to call to get lab results if they haven't heard anything in the next week.    Jeanann Lewandowsky, MD, MHA, FACP, FAAP [facility] [place], [place] [phone]   04/18/2013, 3:31 PM

## 2013-04-18 NOTE — Patient Instructions (Signed)

## 2013-04-18 NOTE — Progress Notes (Signed)
Pt here f/u Diabetes,htn Pt went to retina specialist today but was noncompliant Blood sugars in 300's with coverage

## 2013-04-23 ENCOUNTER — Telehealth: Payer: Self-pay | Admitting: Emergency Medicine

## 2013-04-23 DIAGNOSIS — E1165 Type 2 diabetes mellitus with hyperglycemia: Secondary | ICD-10-CM

## 2013-04-23 DIAGNOSIS — IMO0002 Reserved for concepts with insufficient information to code with codable children: Secondary | ICD-10-CM

## 2013-04-23 MED ORDER — INSULIN ASPART 100 UNIT/ML ~~LOC~~ SOLN: 30.0000 [IU] | Freq: Three times a day (TID) | SUBCUTANEOUS | Status: DC

## 2013-04-23 MED ORDER — FLUCONAZOLE 150 MG PO TABS: 150.0000 mg | ORAL_TABLET | Freq: Once | ORAL | Status: DC

## 2013-04-23 MED ORDER — NYSTATIN-TRIAMCINOLONE 100000-0.1 UNIT/GM-% EX OINT: TOPICAL_OINTMENT | Freq: Four times a day (QID) | CUTANEOUS | Status: DC | PRN

## 2013-04-23 MED ORDER — INSULIN GLARGINE 100 UNIT/ML ~~LOC~~ SOLN: 65.0000 [IU] | Freq: Every day | SUBCUTANEOUS | Status: DC

## 2013-04-23 NOTE — Addendum Note (Signed)
Addended by: Nonnie Done D on: 04/23/2013 11:47 AM   Modules accepted: Orders, Medications

## 2013-04-23 NOTE — Progress Notes (Signed)
Re-prescribed.

## 2013-04-23 NOTE — Telephone Encounter (Signed)
Heather- [facility] from [facility] called regarding pt uncontrolled CBG's for the past 4 dys with insulin coverage. CBG's have been running in 350-400's Pt also having urinary sx's peri/labia area irritation with burning,brown d/c. Requesting medication for possible yeast infection

## 2013-04-23 NOTE — Telephone Encounter (Signed)
Prescribe:  Diflucan 150 mg tablet by mouth once Nystatin cream local application 4 times a day  Increase Lantus insulin to 65 units at bedtime, and increase regular insulin to 30 units 3 times a day with meal His blood sugars to remain uncontrolled patient should come in for clinic visit

## 2013-04-23 NOTE — Telephone Encounter (Signed)
Message for new orders and scripts given to family friend Selena Batten. Will also call nurse Heather from [facility] with update orders per Dr. Hyman Hopes

## 2013-04-30 ENCOUNTER — Telehealth: Payer: Self-pay | Admitting: Emergency Medicine

## 2013-04-30 NOTE — Telephone Encounter (Signed)
Spoke with pt home care nurse [facility]ch [facility]. States she  needs verbal order for home care re certification. Verbal orders given to continue home needs. There also requesting verbal order to bring in social worker for pt. I informed Vernona Rieger we have those services here. Nurse also reports uncontrolled blood sugars.

## 2013-05-02 ENCOUNTER — Other Ambulatory Visit: Payer: Self-pay | Admitting: Emergency Medicine

## 2013-05-02 DIAGNOSIS — I10 Essential (primary) hypertension: Secondary | ICD-10-CM

## 2013-05-02 MED ORDER — LISINOPRIL-HYDROCHLOROTHIAZIDE 10-12.5 MG PO TABS: 1.0000 | ORAL_TABLET | Freq: Every day | ORAL | Status: DC

## 2013-05-02 MED ORDER — NYSTATIN-TRIAMCINOLONE 100000-0.1 UNIT/GM-% EX OINT: TOPICAL_OINTMENT | Freq: Three times a day (TID) | CUTANEOUS | Status: DC

## 2013-05-03 ENCOUNTER — Telehealth: Payer: Self-pay

## 2013-05-03 NOTE — Telephone Encounter (Signed)
Verbal orders for social work and PT evaluation Given to amber from [facility]- authorized by Dr Hyman Hopes

## 2013-05-10 ENCOUNTER — Other Ambulatory Visit: Payer: Self-pay | Admitting: Emergency Medicine

## 2013-05-16 ENCOUNTER — Other Ambulatory Visit: Payer: Self-pay | Admitting: Emergency Medicine

## 2013-05-20 ENCOUNTER — Ambulatory Visit: Payer: Self-pay | Admitting: Internal Medicine

## 2013-05-21 ENCOUNTER — Telehealth: Payer: Self-pay

## 2013-05-21 NOTE — Telephone Encounter (Signed)
healther from [facility] called Patient blood sugar is 400 gave coverage Still having an odor in the vaginal area Missed her latest appt.  Instructed would need to be seen So we can get a urine sample and to evaluate

## 2013-05-30 ENCOUNTER — Encounter: Payer: Self-pay | Admitting: Internal Medicine

## 2013-05-30 ENCOUNTER — Ambulatory Visit: Payer: Medicaid Other | Attending: Internal Medicine | Admitting: Internal Medicine

## 2013-05-30 VITALS — BP 142/88 | HR 98 | Resp 16 | Wt 225.0 lb

## 2013-05-30 DIAGNOSIS — I10 Essential (primary) hypertension: Secondary | ICD-10-CM | POA: Insufficient documentation

## 2013-05-30 DIAGNOSIS — IMO0002 Reserved for concepts with insufficient information to code with codable children: Secondary | ICD-10-CM

## 2013-05-30 DIAGNOSIS — E1165 Type 2 diabetes mellitus with hyperglycemia: Secondary | ICD-10-CM

## 2013-05-30 DIAGNOSIS — F4310 Post-traumatic stress disorder, unspecified: Secondary | ICD-10-CM | POA: Insufficient documentation

## 2013-05-30 DIAGNOSIS — B369 Superficial mycosis, unspecified: Secondary | ICD-10-CM

## 2013-05-30 DIAGNOSIS — E039 Hypothyroidism, unspecified: Secondary | ICD-10-CM

## 2013-05-30 DIAGNOSIS — E11319 Type 2 diabetes mellitus with unspecified diabetic retinopathy without macular edema: Secondary | ICD-10-CM

## 2013-05-30 DIAGNOSIS — E042 Nontoxic multinodular goiter: Secondary | ICD-10-CM | POA: Insufficient documentation

## 2013-05-30 DIAGNOSIS — E1310 Other specified diabetes mellitus with ketoacidosis without coma: Secondary | ICD-10-CM

## 2013-05-30 DIAGNOSIS — N390 Urinary tract infection, site not specified: Secondary | ICD-10-CM | POA: Insufficient documentation

## 2013-05-30 DIAGNOSIS — E083499 Diabetes mellitus due to underlying condition with severe nonproliferative diabetic retinopathy without macular edema, unspecified eye: Secondary | ICD-10-CM

## 2013-05-30 DIAGNOSIS — E1339 Other specified diabetes mellitus with other diabetic ophthalmic complication: Secondary | ICD-10-CM

## 2013-05-30 DIAGNOSIS — E1110 Type 2 diabetes mellitus with ketoacidosis without coma: Secondary | ICD-10-CM

## 2013-05-30 LAB — GLUCOSE, POCT (MANUAL RESULT ENTRY): POC Glucose: 226 mg/dl — AB (ref 70–99)

## 2013-05-30 MED ORDER — SERTRALINE HCL 25 MG PO TABS: 50.0000 mg | ORAL_TABLET | Freq: Every day | ORAL | Status: DC

## 2013-05-30 MED ORDER — CIPROFLOXACIN HCL 500 MG PO TABS: 500.0000 mg | ORAL_TABLET | Freq: Two times a day (BID) | ORAL | Status: DC

## 2013-05-30 MED ORDER — INSULIN ASPART 100 UNIT/ML ~~LOC~~ SOLN: 30.0000 [IU] | Freq: Three times a day (TID) | SUBCUTANEOUS | Status: DC

## 2013-05-30 MED ORDER — LISINOPRIL-HYDROCHLOROTHIAZIDE 10-12.5 MG PO TABS: 1.0000 | ORAL_TABLET | Freq: Every day | ORAL | Status: DC

## 2013-05-30 MED ORDER — SAXAGLIPTIN HCL 2.5 MG PO TABS: 2.5000 mg | ORAL_TABLET | Freq: Two times a day (BID) | ORAL | Status: DC

## 2013-05-30 MED ORDER — INSULIN GLARGINE 100 UNIT/ML ~~LOC~~ SOLN: 65.0000 [IU] | Freq: Every day | SUBCUTANEOUS | Status: DC

## 2013-05-30 MED ORDER — CLOTRIMAZOLE 1 % EX CREA: 1.0000 "application " | TOPICAL_CREAM | Freq: Two times a day (BID) | CUTANEOUS | Status: DC

## 2013-05-30 NOTE — Progress Notes (Signed)
Pt here with family and Arabic interpretor for f/u uncontrolled Diabetes with insulin Sister states cbg 200 the best ever C/o left flank pain radiating lower abdomen Need steroid cream, not covered by [employer]

## 2013-05-30 NOTE — Progress Notes (Signed)
Patient ID: Debbie [name], female   DOB: 11/15/1960, 52 y.o.   MRN: [id number] Patient Demographics  Debbie [name], is a 52 y.o. female  YQM:[id number]  [id number]  DOB - [birth year]  Chief Complaint  Patient presents with  . Follow-up  . Diabetes        Subjective:   Debbie [name] is a 52 y.o. female here today for a follow up visit. Patient is nonverbal, most of the information from her sister and caregiver. Patient has multiple medical history including hypertension, uncontrolled diabetes, bilateral cataract, multinodular goiter, hypothyroidism, polyneuropathy in diabetes, here today with complaint of rashes around the perineum and burning sensation during micturition, associated with some flank pain. She has no fever. Lately her blood sugar has been running around 200-220, better control than he used to be in the 400s. Denies chest pain.  Patient has No headache, No chest pain, No abdominal pain - No Nausea, No new weakness tingling or numbness, No Cough - SOB.  ALLERGIES: Allergies  Allergen Reactions  . Penicillins Rash    PAST MEDICAL HISTORY: Past Medical History  Diagnosis Date  . Type II or unspecified type diabetes mellitus with unspecified complication, uncontrolled     x ~ 10 years  . Hypertension     x ~ 10 years  . Hypothyroidism   . Chest pain     a. ? MI in 2010, [place]->medically managed  . Diabetic foot ulcer   . Obesity   . Cataract     MEDICATIONS AT HOME: Prior to Admission medications   Medication Sig Start Date End Date Taking? Authorizing Provider  acetaminophen (TYLENOL) 325 MG tablet Take 2 tablets (650 mg total) by mouth every 6 (six) hours as needed. 01/02/13   Rhetta Mura, MD  ciprofloxacin (CIPRO) 500 MG tablet Take 1 tablet (500 mg total) by mouth 2 (two) times daily. 05/30/13   Jeanann Lewandowsky, MD  clotrimazole (LOTRIMIN) 1 % cream Apply 1 application topically 2 (two) times daily. 05/30/13   Jeanann Lewandowsky, MD  diazepam  (VALIUM) 2 MG tablet Take 1 tablet (2 mg total) by mouth every 8 (eight) hours as needed for anxiety. 02/04/13   Sena Hitch, MD  fluconazole (DIFLUCAN) 150 MG tablet Take 1 tablet (150 mg total) by mouth once. 04/23/13   Jeanann Lewandowsky, MD  Glucose Blood (BLOOD GLUCOSE TEST STRIPS) STRP Use as directed 04/18/13   Jeanann Lewandowsky, MD  insulin aspart (NOVOLOG) 100 UNIT/ML injection Inject 30 Units into the skin 3 (three) times daily before meals. 05/30/13   Jeanann Lewandowsky, MD  insulin glargine (LANTUS) 100 UNIT/ML injection Inject 0.65 mLs (65 Units total) into the skin at bedtime. 05/30/13   Jeanann Lewandowsky, MD  lisinopril-hydrochlorothiazide (PRINZIDE,ZESTORETIC) 10-12.5 MG per tablet Take 1 tablet by mouth daily. 05/30/13   Jeanann Lewandowsky, MD  loteprednol (LOTEMAX) 0.5 % ophthalmic suspension Place 1 drop into both eyes 2 (two) times daily.    Historical Provider, MD  neomycin-polymyxin-pramoxine (NEOSPORIN PLUS) 1 % cream Apply 1 application topically 2 (two) times daily. 12/05/12   Ripudeep Jenna Luo, MD  nystatin-triamcinolone ointment (MYCOLOG) Apply topically 3 (three) times daily. 05/02/13   Jeanann Lewandowsky, MD  saxagliptin HCl (ONGLYZA) 2.5 MG TABS tablet Take 1 tablet (2.5 mg total) by mouth 2 (two) times daily. 05/30/13   Jeanann Lewandowsky, MD  senna (SENOKOT) 8.6 MG TABS Take 2 tablets (17.2 mg total) by mouth at bedtime as needed (for constipation). 01/19/13   [name], MD  sertraline (ZOLOFT) 25 MG  tablet Take 2 tablets (50 mg total) by mouth daily. 05/30/13   Jeanann Lewandowsky, MD     Objective:   Filed Vitals:   05/30/13 1231  BP: 142/88  Pulse: 98  Resp: 16  Weight: 225 lb (102.059 kg)  SpO2: 97%    Exam General appearance : Awake, alert, not in any distress. Not toxic looking, morbidly obese, on wheelchair, resting tremor of her right hand, easily irritated HEENT: Atraumatic and Normocephalic, wearing dark glasses, refuses to remove them Neck: supple,  no JVD. No cervical lymphadenopathy.  Chest:Good air entry bilaterally, no added sounds  CVS: S1 S2 regular, no murmurs.  Abdomen: Not examined Extremities: B/L Lower Ext shows no edema, both legs are warm to touch Neurology: Not examined  Wounds:N/A   Data Review   CBC No results found for this basename: WBC, HGB, HCT, PLT, MCV, MCH, MCHC, RDW, NEUTRABS, LYMPHSABS, MONOABS, EOSABS, BASOSABS, BANDABS, BANDSABD,  in the last 168 hours  Chemistries   No results found for this basename: NA, K, CL, CO2, GLUCOSE, BUN, CREATININE, GFRCGP, CALCIUM, MG, AST, ALT, ALKPHOS, BILITOT,  in the last 168 hours ------------------------------------------------------------------------------------------------------------------ No results found for this basename: HGBA1C,  in the last 72 hours ------------------------------------------------------------------------------------------------------------------ No results found for this basename: CHOL, HDL, LDLCALC, TRIG, CHOLHDL, LDLDIRECT,  in the last 72 hours ------------------------------------------------------------------------------------------------------------------ No results found for this basename: TSH, T4TOTAL, FREET3, T3FREE, THYROIDAB,  in the last 72 hours ------------------------------------------------------------------------------------------------------------------ No results found for this basename: VITAMINB12, FOLATE, FERRITIN, TIBC, IRON, RETICCTPCT,  in the last 72 hours  Coagulation profile  No results found for this basename: INR, PROTIME,  in the last 168 hours    Assessment & Plan   1. DM (diabetes mellitus) type 2, uncontrolled, with ketoacidosis  - Glucose (CBG): 220 today Patient counseled and educated on diabetic diet   2. HTN (hypertension) Continue Refill- lisinopril-hydrochlorothiazide (PRINZIDE,ZESTORETIC) 10-12.5 MG per tablet; Take 1 tablet by mouth daily.  Dispense: 90 tablet; Refill: 3  3. Type II or  unspecified type diabetes mellitus with unspecified complication, uncontrolled Refill - insulin glargine (LANTUS) 100 UNIT/ML injection; Inject 0.65 mLs (65 Units total) into the skin at bedtime.  Dispense: 10 mL; Refill: 11 - insulin aspart (NOVOLOG) 100 UNIT/ML injection; Inject 30 Units into the skin 3 (three) times daily before meals.  Dispense: 3 vial; Refill: PRN - saxagliptin HCl (ONGLYZA) 2.5 MG TABS tablet; Take 1 tablet (2.5 mg total) by mouth 2 (two) times daily.  Dispense: 60 tablet; Refill: 3  4. PTSD (post-traumatic stress disorder) Refill- sertraline (ZOLOFT) 25 MG tablet; Take 2 tablets (50 mg total) by mouth daily.  Dispense: 90 tablet; Refill: 3  5. Multinodular goiter Biopsy report revealed  6. Diabetic retinopathy, due to underlying condition, without macular edema, with severe nonproliferative retinopathy Patient counseled  7. UTI (urinary tract infection) Patient refused to give urine sample, we will treat empirically - ciprofloxacin (CIPRO) 500 MG tablet; Take 1 tablet (500 mg total) by mouth 2 (two) times daily.  Dispense: 10 tablet; Refill: 0  9. Fungal dermatitis - clotrimazole (LOTRIMIN) 1 % cream; Apply 1 application topically 2 (two) times daily.  Dispense: 30 g; Refill: 2   Follow up in 3 months or when necessary   The patient was given clear instructions to go to ER or return to medical center if symptoms don't improve, worsen or new problems develop. The patient verbalized understanding. The patient was told to call to get lab results if they haven't heard anything in the  next week.    Jeanann Lewandowsky, MD, MHA, FACP, FAAP [facility] and [facility] [place], [place] [phone]   05/30/2013, 12:57 PM

## 2013-06-03 MED ORDER — CIPROFLOXACIN HCL 500 MG PO TABS: 500.0000 mg | ORAL_TABLET | Freq: Two times a day (BID) | ORAL | Status: DC

## 2013-06-03 MED ORDER — INSULIN GLARGINE 100 UNIT/ML ~~LOC~~ SOLN: 65.0000 [IU] | Freq: Every day | SUBCUTANEOUS | Status: DC

## 2013-06-03 MED ORDER — INSULIN ASPART 100 UNIT/ML ~~LOC~~ SOLN: 30.0000 [IU] | Freq: Three times a day (TID) | SUBCUTANEOUS | Status: DC

## 2013-06-03 MED ORDER — LISINOPRIL-HYDROCHLOROTHIAZIDE 10-12.5 MG PO TABS: 1.0000 | ORAL_TABLET | Freq: Every day | ORAL | Status: DC

## 2013-06-03 MED ORDER — CLOTRIMAZOLE 1 % EX CREA: 1.0000 "application " | TOPICAL_CREAM | Freq: Two times a day (BID) | CUTANEOUS | Status: DC

## 2013-06-03 MED ORDER — SAXAGLIPTIN HCL 2.5 MG PO TABS: 2.5000 mg | ORAL_TABLET | Freq: Two times a day (BID) | ORAL | Status: DC

## 2013-06-03 MED ORDER — SERTRALINE HCL 25 MG PO TABS: 50.0000 mg | ORAL_TABLET | Freq: Every day | ORAL | Status: DC

## 2013-06-03 MED ORDER — BLOOD GLUCOSE TEST VI STRP: ORAL_STRIP | Status: DC

## 2013-06-03 NOTE — Addendum Note (Signed)
Addended by: Susie Cassette MD, Germain Osgood on: 06/03/2013 11:04 AM   Modules accepted: Orders

## 2013-06-10 ENCOUNTER — Telehealth: Payer: Self-pay | Admitting: Internal Medicine

## 2013-06-10 NOTE — Telephone Encounter (Signed)
Carlena Sax from [facility] calling about paperwork faxed over for pt regarding DOS of Sept 2014.

## 2013-06-11 ENCOUNTER — Telehealth: Payer: Self-pay

## 2013-06-11 ENCOUNTER — Telehealth: Payer: Self-pay | Admitting: Internal Medicine

## 2013-06-11 MED ORDER — INSULIN ASPART 100 UNIT/ML ~~LOC~~ SOLN: 30.0000 [IU] | Freq: Three times a day (TID) | SUBCUTANEOUS | Status: DC

## 2013-06-11 MED ORDER — "INSULIN SYRINGE 30G X 5/16"" 1 ML MISC": Status: DC

## 2013-06-11 MED ORDER — INSULIN PEN NEEDLE 31G X 5 MM MISC: Status: DC

## 2013-06-11 MED ORDER — INSULIN GLARGINE 100 UNIT/ML ~~LOC~~ SOLN: 65.0000 [IU] | Freq: Every day | SUBCUTANEOUS | Status: DC

## 2013-06-11 NOTE — Telephone Encounter (Signed)
Heather  from [facility] called Needed new scripts written  For lantus novolog Insulin syringes  To be covered under medicaid Dr  Thedore Mins wrote new prescriptions Left message on her voice mail to call us to pick them up

## 2013-06-11 NOTE — Telephone Encounter (Signed)
Heather from [facility], says pt's scripts need to be changed to a different provider, for Medicaid, and needs clarification on Lantus and Novolong insulin.  Pt also needs scripts for diabetic supplies including syringes and strips. Please f/u with Heather.

## 2013-06-12 ENCOUNTER — Telehealth: Payer: Self-pay | Admitting: Internal Medicine

## 2013-06-12 NOTE — Telephone Encounter (Signed)
Pt needs insulin script to be written for more than one vial at a time for Medicaid to cover cost of med.  Also pt needs refill for medications because Medicaid runs out on 07/10/13 and needs refill before then. Please f/u with Selena Batten, family caseworker.

## 2013-06-13 ENCOUNTER — Ambulatory Visit: Payer: Medicaid Other | Attending: Internal Medicine | Admitting: Internal Medicine

## 2013-06-13 ENCOUNTER — Telehealth: Payer: Self-pay | Admitting: Internal Medicine

## 2013-06-13 ENCOUNTER — Encounter: Payer: Self-pay | Admitting: Internal Medicine

## 2013-06-13 VITALS — BP 142/79 | HR 98 | Temp 98.9°F | Resp 15 | Ht 64.0 in | Wt 240.0 lb

## 2013-06-13 DIAGNOSIS — E11319 Type 2 diabetes mellitus with unspecified diabetic retinopathy without macular edema: Secondary | ICD-10-CM | POA: Insufficient documentation

## 2013-06-13 DIAGNOSIS — I10 Essential (primary) hypertension: Secondary | ICD-10-CM

## 2013-06-13 DIAGNOSIS — I739 Peripheral vascular disease, unspecified: Secondary | ICD-10-CM

## 2013-06-13 DIAGNOSIS — F4310 Post-traumatic stress disorder, unspecified: Secondary | ICD-10-CM

## 2013-06-13 DIAGNOSIS — E1169 Type 2 diabetes mellitus with other specified complication: Secondary | ICD-10-CM

## 2013-06-13 DIAGNOSIS — E1159 Type 2 diabetes mellitus with other circulatory complications: Secondary | ICD-10-CM

## 2013-06-13 DIAGNOSIS — E039 Hypothyroidism, unspecified: Secondary | ICD-10-CM

## 2013-06-13 DIAGNOSIS — F4323 Adjustment disorder with mixed anxiety and depressed mood: Secondary | ICD-10-CM

## 2013-06-13 DIAGNOSIS — F411 Generalized anxiety disorder: Secondary | ICD-10-CM

## 2013-06-13 DIAGNOSIS — E11621 Type 2 diabetes mellitus with foot ulcer: Secondary | ICD-10-CM

## 2013-06-13 DIAGNOSIS — E1142 Type 2 diabetes mellitus with diabetic polyneuropathy: Secondary | ICD-10-CM

## 2013-06-13 DIAGNOSIS — L97509 Non-pressure chronic ulcer of other part of unspecified foot with unspecified severity: Secondary | ICD-10-CM

## 2013-06-13 DIAGNOSIS — B369 Superficial mycosis, unspecified: Secondary | ICD-10-CM

## 2013-06-13 DIAGNOSIS — R4182 Altered mental status, unspecified: Secondary | ICD-10-CM

## 2013-06-13 DIAGNOSIS — IMO0002 Reserved for concepts with insufficient information to code with codable children: Secondary | ICD-10-CM

## 2013-06-13 DIAGNOSIS — E1151 Type 2 diabetes mellitus with diabetic peripheral angiopathy without gangrene: Secondary | ICD-10-CM

## 2013-06-13 DIAGNOSIS — E1165 Type 2 diabetes mellitus with hyperglycemia: Secondary | ICD-10-CM

## 2013-06-13 DIAGNOSIS — Z0000 Encounter for general adult medical examination without abnormal findings: Secondary | ICD-10-CM

## 2013-06-13 DIAGNOSIS — F419 Anxiety disorder, unspecified: Secondary | ICD-10-CM

## 2013-06-13 DIAGNOSIS — E1139 Type 2 diabetes mellitus with other diabetic ophthalmic complication: Secondary | ICD-10-CM

## 2013-06-13 MED ORDER — LISINOPRIL-HYDROCHLOROTHIAZIDE 10-12.5 MG PO TABS: 1.0000 | ORAL_TABLET | Freq: Every day | ORAL | Status: DC

## 2013-06-13 MED ORDER — BLOOD GLUCOSE TEST VI STRP: ORAL_STRIP | Status: DC

## 2013-06-13 MED ORDER — SERTRALINE HCL 25 MG PO TABS: 50.0000 mg | ORAL_TABLET | Freq: Every day | ORAL | Status: DC

## 2013-06-13 MED ORDER — INSULIN PEN NEEDLE 31G X 5 MM MISC: Status: DC

## 2013-06-13 MED ORDER — "INSULIN SYRINGE 30G X 5/16"" 1 ML MISC": Status: DC

## 2013-06-13 MED ORDER — INSULIN ASPART 100 UNIT/ML ~~LOC~~ SOLN: 30.0000 [IU] | Freq: Three times a day (TID) | SUBCUTANEOUS | Status: DC

## 2013-06-13 MED ORDER — CLOTRIMAZOLE 1 % EX CREA: 1.0000 "application " | TOPICAL_CREAM | Freq: Two times a day (BID) | CUTANEOUS | Status: DC

## 2013-06-13 MED ORDER — SAXAGLIPTIN HCL 2.5 MG PO TABS: 2.5000 mg | ORAL_TABLET | Freq: Two times a day (BID) | ORAL | Status: DC

## 2013-06-13 MED ORDER — INSULIN GLARGINE 100 UNIT/ML ~~LOC~~ SOLN: 65.0000 [IU] | Freq: Every day | SUBCUTANEOUS | Status: DC

## 2013-06-13 NOTE — Telephone Encounter (Signed)
Pt went to opthalmology referral, Opthalmologist says that eye pain is not due to cataracts or due to diabetes complications. Pt is in a lot of pain due to eye, and has not eaten in a couple of days due to pain.  Has been taking pain meds that are not working.  Please f/u with family caseworker with advice whether the pt should come in for visit.

## 2013-06-13 NOTE — Progress Notes (Signed)
Patient ID: Debbie [name], female   DOB: Aug 25, 1960, 52 y.o.   MRN: [id number]  CC: Followup  HPI: Patient is a 52 year old female was multiple medical comorbidities including diabetes, hypertension, diabetic retinopathy who presents to clinic for followup. Patient reports continuous pain in the eyes. In addition she started moaning per family members this morning. She has tremor in the right hand which is persistent. No complaints of chest pain, shortness of breath. No nausea or vomiting.  Allergies  Allergen Reactions  . Penicillins Rash   Past Medical History  Diagnosis Date  . Type II or unspecified type diabetes mellitus with unspecified complication, uncontrolled     x ~ 10 years  . Hypertension     x ~ 10 years  . Hypothyroidism   . Chest pain     a. ? MI in 2010, [place]->medically managed  . Diabetic foot ulcer   . Obesity   . Cataract    Current Outpatient Prescriptions on File Prior to Visit  Medication Sig Dispense Refill  . ciprofloxacin (CIPRO) 500 MG tablet Take 1 tablet (500 mg total) by mouth 2 (two) times daily.  10 tablet  0  . clotrimazole (LOTRIMIN) 1 % cream Apply 1 application topically 2 (two) times daily.  30 g  2  . diazepam (VALIUM) 2 MG tablet Take 1 tablet (2 mg total) by mouth every 8 (eight) hours as needed for anxiety.  12 tablet  0  . Glucose Blood (BLOOD GLUCOSE TEST STRIPS) STRP Use as directed  200 each  2  . lisinopril-hydrochlorothiazide (PRINZIDE,ZESTORETIC) 10-12.5 MG per tablet Take 1 tablet by mouth daily.  90 tablet  3  . loteprednol (LOTEMAX) 0.5 % ophthalmic suspension Place 1 drop into both eyes 2 (two) times daily.      Marland Kitchen neomycin-polymyxin-pramoxine (NEOSPORIN PLUS) 1 % cream Apply 1 application topically 2 (two) times daily.      Marland Kitchen nystatin-triamcinolone ointment (MYCOLOG) Apply topically 3 (three) times daily.  30 g  0  . sertraline (ZOLOFT) 25 MG tablet Take 2 tablets (50 mg total) by mouth daily.  90 tablet  3  . acetaminophen  (TYLENOL) 325 MG tablet Take 2 tablets (650 mg total) by mouth every 6 (six) hours as needed.      . fluconazole (DIFLUCAN) 150 MG tablet Take 1 tablet (150 mg total) by mouth once.  1 tablet  0  . saxagliptin HCl (ONGLYZA) 2.5 MG TABS tablet Take 1 tablet (2.5 mg total) by mouth 2 (two) times daily.  60 tablet  3  . senna (SENOKOT) 8.6 MG TABS Take 2 tablets (17.2 mg total) by mouth at bedtime as needed (for constipation).  30 each  0   No current facility-administered medications on file prior to visit.   Family History  Problem Relation Age of Onset  . Diabetes Father     died in his 41's?  . Diabetes Father     died in her 4's?  . Diabetes Sister     alive and well.   History   Social History  . Marital Status: Single    Spouse Name: N/A    Number of Children: N/A  . Years of Education: N/A   Occupational History  . Not on file.   Social History Main Topics  . Smoking status: Never Smoker   . Smokeless tobacco: Not on file  . Alcohol Use: No  . Drug Use: No  . Sexual Activity: No   Other Topics Concern  .  Not on file   Social History Narrative   From [place], [place].  Moved to [place] to live with sister in April of 2014.  She does not routinely exercise.    Review of Systems  Constitutional: Negative for fever, chills, diaphoresis, activity change, appetite change and fatigue.  HENT: Negative for ear pain, nosebleeds, congestion, facial swelling, rhinorrhea, neck pain, neck stiffness and ear discharge.   Eyes: Positive for pain, negative for discharge, redness, itching and visual disturbance.  Respiratory: Negative for cough, choking, chest tightness, shortness of breath, wheezing and stridor.   Cardiovascular: Negative for chest pain, palpitations and leg swelling.  Gastrointestinal: Negative for abdominal distention.  Genitourinary: Negative for dysuria, urgency, frequency, hematuria, flank pain, decreased urine volume, difficulty urinating and dyspareunia.   Musculoskeletal: Negative for back pain, joint swelling, arthralgias and gait problem.  Neurological: Negative for dizziness, tremors, seizures, syncope, facial asymmetry, speech difficulty, weakness, light-headedness, numbness and headaches.  Hematological: Negative for adenopathy. Does not bruise/bleed easily.  Psychiatric/Behavioral: Positive for Moaning.    Objective:   Filed Vitals:   06/13/13 1406  BP: 142/79  Pulse: 98  Temp: 98.9 F (37.2 C)  Resp: 15    Physical Exam  Constitutional: Appears well-developed and well-nourished. Moaning  HENT: Normocephalic. External right and left ear normal. Oropharynx is clear and moist.  Eyes: Unable to test extraocular muscles, patient reports pain in the eyes with light Neck: Normal ROM. Neck supple. No JVD. No tracheal deviation. No thyromegaly.  CVS: RRR, S1/S2 +, no murmurs, no gallops, no carotid bruit.  Pulmonary: Effort and breath sounds normal, no stridor, rhonchi, wheezes, rales.  Abdominal: Soft. BS +,  no distension, tenderness, rebound or guarding.  Musculoskeletal: Normal range of motion. No edema and no tenderness.  Lymphadenopathy: No lymphadenopathy noted, cervical, inguinal. Neuro: Alert. Normal reflexes, muscle tone coordination. No cranial nerve deficit. Persistent hand tremor, right hand Skin: Skin is warm and dry. No rash noted. Not diaphoretic. No erythema. No pallor.  Psychiatric: Does not speak, moaning  Lab Results  Component Value Date   WBC 8.3 02/14/2013   HGB 14.8 02/14/2013   HCT 42.3 02/14/2013   MCV 79.4 02/14/2013   PLT 239 02/14/2013   Lab Results  Component Value Date   CREATININE 0.57 02/14/2013   BUN 18 02/14/2013   NA 138 02/14/2013   K 4.1 02/14/2013   CL 102 02/14/2013   CO2 25 02/14/2013    Lab Results  Component Value Date   HGBA1C 14.0 04/18/2013   Lipid Panel  No results found for this basename: chol, trig, hdl, cholhdl, vldl, ldlcalc       Assessment and plan:   Patient Active Problem List    Diagnosis Date Noted  . Altered mental status 12/29/2012    Priority: High - Patient needs a psychiatry evaluation  - May continue current medications, Zoloft and Valium  - Neurology referral as well   . Generalized anxiety disorder 12/13/2012    Priority: Medium - [facility]   . Diabetic retinopathy 12/12/2012    Priority: Medium - Ophthalmology referral provided   . Diabetes mellitus type 2 with peripheral artery disease 11/09/2012    Priority: Medium - Prescription for insulin provided   . HTN (hypertension) 11/09/2012    Priority: Medium - Continue current blood pressure medication  - We have discussed target BP range - I have advised pt to check BP regularly and to call us back if the numbers are higher than 140/90 - discussed the  importance of compliance with medical therapy and diet

## 2013-06-13 NOTE — Patient Instructions (Signed)
Blood Glucose Monitoring, Adult  Monitoring your blood glucose (also know as blood sugar) helps you to manage your diabetes. It also helps you and your health care provider monitor your diabetes and determine how well your treatment plan is working.  WHY SHOULD YOU MONITOR YOUR BLOOD GLUCOSE?  · It can help you understand how food, exercise, and medicine affect your blood glucose.  · It allows you to know what your blood glucose is at any given moment. You can quickly tell if you are having low blood glucose (hypoglycemia) or high blood glucose (hyperglycemia).  · It can help you and your health care provider know how to adjust your medicines.  · It can help you understand how to manage an illness or adjust medicine for exercise.  WHEN SHOULD YOU TEST?  Your health care provider will help you decide how often you should check your blood glucose. This may depend on the type of diabetes you have, your diabetes control, or the types of medicines you are taking. Be sure to write down all of your blood glucose readings so that this information can be reviewed with your health care provider. See below for examples of testing times that your health care provider may suggest.  Type 1 Diabetes  · Test 4 times a day if you are in good control, using an insulin pump, or perform multiple daily injections.  · If your diabetes is not well-controlled or if you are sick, you may need to monitor more often.  · It is a good idea to also monitor:  · Before and after exercise.  · Between meals and 2 hours after a meal.  · Occasionally between 2:00 to 3:00 am.  Type 2 Diabetes  · It can vary with each person, but generally, if you are on insulin, test 4 times a day.  · If you take medicines by mouth (orally), test 2 times a day.  · If you are on a controlled diet, test once a day.  · If your diabetes is not well controlled or if you are sick, you may need to monitor more often.  HOW TO MONITOR YOUR BLOOD GLUCOSE  Supplies  Needed  · Blood glucose meter.  · Test strips for your meter. Each meter has its own strips. You must use the strips that go with your own meter.  · A pricking needle (lancet).  · A device that holds the lancet (lancing device).  · A journal or log book to write down your results.  Procedure  · Wash your hands with soap and water. Alcohol is not preferred.  · Prick the side of your finger (not the tip) with the lancet.  · Gently milk the finger until a small drop of blood appears.  · Follow the instructions that come with your meter for inserting the test strip, applying blood to the strip, and using your blood glucose meter.  Other Areas to Get Blood for Testing  Some meters allow you to use other areas of your body (other than your finger) to test your blood. These areas are called alternative sites. The most common alternative sites are:  · The forearm.  · The thigh.  · The back area of the lower leg.  · The palm of the hand.  The blood flow in these areas is slower. Therefore, the blood glucose values you get may be delayed, and the numbers are different from what you would get from your fingers. Do not use alternative   glucose checks. ADDITIONAL TIPS FOR GLUCOSE MONITORING  Do not reuse lancets.  Always carry your supplies with you.  All blood glucose meters have a 24-hour "hotline" number to call if you have questions or need help.  Adjust (calibrate) your blood glucose meter with a control solution after finishing a few boxes of strips. BLOOD GLUCOSE RECORD KEEPING It is a good idea to keep a daily record or log of your blood glucose readings. Most glucose meters, if not all, keep your glucose records stored in the meter. Some meters come with the ability to download  your records to your home computer. Keeping a record of your blood glucose readings is especially helpful if you are wanting to look for patterns. Make notes to go along with the blood glucose readings because you might forget what happened at that exact time. Keeping good records helps you and your health care provider to work together to achieve good diabetes management.  Document Released: 06/30/2003 Document Revised: 02/27/2013 Document Reviewed: 11/19/2012 [facility] Patient Information 2014 [place], [place]. Hypertension As your heart beats, it forces blood through your arteries. This force is your blood pressure. If the pressure is too high, it is called hypertension (HTN) or high blood pressure. HTN is dangerous because you may have it and not know it. High blood pressure may mean that your heart has to work harder to pump blood. Your arteries may be narrow or stiff. The extra work puts you at risk for heart disease, stroke, and other problems.  Blood pressure consists of two numbers, a higher number over a lower, 110/72, for example. It is stated as "110 over 72." The ideal is below 120 for the top number (systolic) and under 80 for the bottom (diastolic). Write down your blood pressure today. You should pay close attention to your blood pressure if you have certain conditions such as:  Heart failure.  Prior heart attack.  Diabetes  Chronic kidney disease.  Prior stroke.  Multiple risk factors for heart disease. To see if you have HTN, your blood pressure should be measured while you are seated with your arm held at the level of the heart. It should be measured at least twice. A one-time elevated blood pressure reading (especially in the Emergency Department) does not mean that you need treatment. There may be conditions in which the blood pressure is different between your right and left arms. It is important to see your caregiver soon for a recheck. Most people have essential hypertension  which means that there is not a specific cause. This type of high blood pressure may be lowered by changing lifestyle factors such as:  Stress.  Smoking.  Lack of exercise.  Excessive weight.  Drug/tobacco/alcohol use.  Eating less salt. Most people do not have symptoms from high blood pressure until it has caused damage to the body. Effective treatment can often prevent, delay or reduce that damage. TREATMENT  When a cause has been identified, treatment for high blood pressure is directed at the cause. There are a large number of medications to treat HTN. These fall into several categories, and your caregiver will help you select the medicines that are best for you. Medications may have side effects. You should review side effects with your caregiver. If your blood pressure stays high after you have made lifestyle changes or started on medicines,   Your medication(s) may need to be changed.  Other problems may need to be addressed.  Be certain you understand your prescriptions, and  know how and when to take your medicine.  Be sure to follow up with your caregiver within the time frame advised (usually within two weeks) to have your blood pressure rechecked and to review your medications.  If you are taking more than one medicine to lower your blood pressure, make sure you know how and at what times they should be taken. Taking two medicines at the same time can result in blood pressure that is too low. SEEK IMMEDIATE MEDICAL CARE IF:  You develop a severe headache, blurred or changing vision, or confusion.  You have unusual weakness or numbness, or a faint feeling.  You have severe chest or abdominal pain, vomiting, or breathing problems. MAKE SURE YOU:   Understand these instructions.  Will watch your condition.  Will get help right away if you are not doing well or get worse. Document Released: 06/27/2005 Document Revised: 09/19/2011 Document Reviewed:  02/15/2008 [facility] Patient Information 2014 [place], [place].

## 2013-06-13 NOTE — Progress Notes (Signed)
Pt is here for eye pain on level 10; coming from an eye specialist for cataract surgery. Pt is shaking a lot on Rt arm, bilateral eye pain, more in LT eye, sensitive to light, severe moaning and crying. Pt is also a diabetic.

## 2013-06-13 NOTE — Telephone Encounter (Signed)
Pt seen by Dr. Elisabeth Pigeon. Medication will be addressed

## 2013-06-13 NOTE — Telephone Encounter (Signed)
Pt scheduled walk in appt today @ 215 pm

## 2013-06-25 ENCOUNTER — Ambulatory Visit: Payer: Self-pay | Admitting: Neurology

## 2013-06-27 ENCOUNTER — Ambulatory Visit (INDEPENDENT_AMBULATORY_CARE_PROVIDER_SITE_OTHER): Payer: Medicaid Other | Admitting: Neurology

## 2013-06-27 ENCOUNTER — Encounter: Payer: Self-pay | Admitting: Neurology

## 2013-06-27 VITALS — BP 154/83 | HR 90 | Temp 98.3°F | Ht 64.0 in

## 2013-06-27 DIAGNOSIS — F458 Other somatoform disorders: Secondary | ICD-10-CM

## 2013-06-27 DIAGNOSIS — R251 Tremor, unspecified: Secondary | ICD-10-CM
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
## 2013-06-27 NOTE — Patient Instructions (Signed)
I do not believe you have parkinson's disease. I will write back to your doctor with my findings.

## 2013-07-09 ENCOUNTER — Encounter: Payer: Self-pay | Admitting: Emergency Medicine

## 2013-08-02 ENCOUNTER — Ambulatory Visit: Payer: Medicaid Other | Attending: Internal Medicine

## 2013-08-02 VITALS — BP 140/90 | HR 80 | Temp 98.8°F | Resp 17

## 2013-08-02 DIAGNOSIS — L0292 Furuncle, unspecified: Secondary | ICD-10-CM

## 2013-08-02 DIAGNOSIS — I10 Essential (primary) hypertension: Secondary | ICD-10-CM

## 2013-08-02 DIAGNOSIS — E118 Type 2 diabetes mellitus with unspecified complications: Secondary | ICD-10-CM

## 2013-08-02 DIAGNOSIS — E1151 Type 2 diabetes mellitus with diabetic peripheral angiopathy without gangrene: Secondary | ICD-10-CM

## 2013-08-02 DIAGNOSIS — F4310 Post-traumatic stress disorder, unspecified: Secondary | ICD-10-CM

## 2013-08-02 DIAGNOSIS — E1165 Type 2 diabetes mellitus with hyperglycemia: Secondary | ICD-10-CM

## 2013-08-02 DIAGNOSIS — IMO0002 Reserved for concepts with insufficient information to code with codable children: Secondary | ICD-10-CM | POA: Insufficient documentation

## 2013-08-02 MED ORDER — LISINOPRIL-HYDROCHLOROTHIAZIDE 10-12.5 MG PO TABS: 1.0000 | ORAL_TABLET | Freq: Every day | ORAL | Status: DC

## 2013-08-02 MED ORDER — DIAZEPAM 2 MG PO TABS: 2.0000 mg | ORAL_TABLET | Freq: Three times a day (TID) | ORAL | Status: DC | PRN

## 2013-08-02 MED ORDER — SAXAGLIPTIN HCL 2.5 MG PO TABS: 5.0000 mg | ORAL_TABLET | Freq: Every day | ORAL | Status: DC

## 2013-08-02 MED ORDER — SERTRALINE HCL 25 MG PO TABS: 50.0000 mg | ORAL_TABLET | Freq: Every day | ORAL | Status: DC

## 2013-08-02 MED ORDER — SULFAMETHOXAZOLE-TRIMETHOPRIM 800-160 MG PO TABS: 1.0000 | ORAL_TABLET | Freq: Two times a day (BID) | ORAL | Status: DC

## 2013-08-02 MED ORDER — INSULIN GLARGINE 100 UNIT/ML ~~LOC~~ SOLN
65.0000 [IU] | Freq: Every day | SUBCUTANEOUS | Status: DC
Start: 2013-08-02 — End: 2013-08-19

## 2013-08-02 MED ORDER — TRAMADOL HCL 50 MG PO TABS: 50.0000 mg | ORAL_TABLET | Freq: Three times a day (TID) | ORAL | Status: DC | PRN

## 2013-08-02 MED ORDER — INSULIN ASPART 100 UNIT/ML ~~LOC~~ SOLN
30.0000 [IU] | Freq: Three times a day (TID) | SUBCUTANEOUS | Status: DC
Start: 2013-08-02 — End: 2013-08-19

## 2013-08-02 MED ORDER — NEOMYCIN-POLYMYXIN-PRAMOXINE 1 % EX CREA: 1.0000 "application " | TOPICAL_CREAM | Freq: Two times a day (BID) | CUTANEOUS | Status: DC

## 2013-08-02 NOTE — Progress Notes (Unsigned)
Patient presents with a boil to inner right thigh Not ready for incision at this time Was put on antibiotic and some pain meds at this time Instructed to used hot compresses to the area

## 2013-08-19 ENCOUNTER — Other Ambulatory Visit: Payer: Self-pay | Admitting: Internal Medicine

## 2013-08-19 DIAGNOSIS — E1165 Type 2 diabetes mellitus with hyperglycemia: Secondary | ICD-10-CM

## 2013-08-19 DIAGNOSIS — E118 Type 2 diabetes mellitus with unspecified complications: Principal | ICD-10-CM

## 2013-08-19 DIAGNOSIS — IMO0002 Reserved for concepts with insufficient information to code with codable children: Secondary | ICD-10-CM

## 2013-08-19 MED ORDER — SAXAGLIPTIN HCL 2.5 MG PO TABS: 5.0000 mg | ORAL_TABLET | Freq: Every day | ORAL | Status: DC

## 2013-08-19 MED ORDER — INSULIN ASPART 100 UNIT/ML ~~LOC~~ SOLN: 30.0000 [IU] | Freq: Three times a day (TID) | SUBCUTANEOUS | Status: DC

## 2013-08-19 MED ORDER — INSULIN GLARGINE 100 UNIT/ML ~~LOC~~ SOLN: 65.0000 [IU] | Freq: Every day | SUBCUTANEOUS | Status: DC

## 2013-09-02 ENCOUNTER — Ambulatory Visit: Payer: Self-pay

## 2013-09-02 ENCOUNTER — Ambulatory Visit: Payer: Self-pay | Admitting: Internal Medicine

## 2013-09-02 ENCOUNTER — Encounter: Payer: Self-pay | Admitting: Internal Medicine

## 2013-09-04 NOTE — Progress Notes (Signed)
  This encounter was created in error - please disregard. Err  OJ

## 2013-09-11 ENCOUNTER — Ambulatory Visit: Payer: Self-pay | Admitting: Internal Medicine

## 2013-09-17 ENCOUNTER — Ambulatory Visit: Payer: Self-pay | Admitting: Internal Medicine

## 2013-09-17 ENCOUNTER — Ambulatory Visit: Payer: Self-pay

## 2013-09-19 ENCOUNTER — Ambulatory Visit: Payer: Medicaid Other | Attending: Internal Medicine | Admitting: Internal Medicine

## 2013-09-19 ENCOUNTER — Encounter: Payer: Self-pay | Admitting: Internal Medicine

## 2013-09-19 DIAGNOSIS — E1110 Type 2 diabetes mellitus with ketoacidosis without coma: Secondary | ICD-10-CM

## 2013-09-19 DIAGNOSIS — I10 Essential (primary) hypertension: Secondary | ICD-10-CM

## 2013-09-19 DIAGNOSIS — E1165 Type 2 diabetes mellitus with hyperglycemia: Principal | ICD-10-CM

## 2013-09-19 DIAGNOSIS — E039 Hypothyroidism, unspecified: Secondary | ICD-10-CM | POA: Insufficient documentation

## 2013-09-19 DIAGNOSIS — E1310 Other specified diabetes mellitus with ketoacidosis without coma: Secondary | ICD-10-CM

## 2013-09-19 DIAGNOSIS — Z79899 Other long term (current) drug therapy: Secondary | ICD-10-CM | POA: Insufficient documentation

## 2013-09-19 DIAGNOSIS — IMO0001 Reserved for inherently not codable concepts without codable children: Secondary | ICD-10-CM | POA: Insufficient documentation

## 2013-09-19 DIAGNOSIS — L0292 Furuncle, unspecified: Secondary | ICD-10-CM

## 2013-09-19 DIAGNOSIS — F4310 Post-traumatic stress disorder, unspecified: Secondary | ICD-10-CM

## 2013-09-19 DIAGNOSIS — L0293 Carbuncle, unspecified: Secondary | ICD-10-CM

## 2013-09-19 DIAGNOSIS — Z794 Long term (current) use of insulin: Secondary | ICD-10-CM | POA: Insufficient documentation

## 2013-09-19 LAB — CBC WITH DIFFERENTIAL/PLATELET
BASOS PCT: 1 % (ref 0–1)
Basophils Absolute: 0.1 10*3/uL (ref 0.0–0.1)
EOS ABS: 0.1 10*3/uL (ref 0.0–0.7)
EOS PCT: 1 % (ref 0–5)
HCT: 45.6 % (ref 36.0–46.0)
Hemoglobin: 15.3 g/dL — ABNORMAL HIGH (ref 12.0–15.0)
Lymphocytes Relative: 42 % (ref 12–46)
Lymphs Abs: 3.7 10*3/uL (ref 0.7–4.0)
MCH: 27.2 pg (ref 26.0–34.0)
MCHC: 33.6 g/dL (ref 30.0–36.0)
MCV: 81.1 fL (ref 78.0–100.0)
Monocytes Absolute: 0.3 10*3/uL (ref 0.1–1.0)
Monocytes Relative: 3 % (ref 3–12)
NEUTROS PCT: 53 % (ref 43–77)
Neutro Abs: 4.6 10*3/uL (ref 1.7–7.7)
Platelets: 259 10*3/uL (ref 150–400)
RBC: 5.62 MIL/uL — ABNORMAL HIGH (ref 3.87–5.11)
RDW: 13.6 % (ref 11.5–15.5)
WBC: 8.7 10*3/uL (ref 4.0–10.5)

## 2013-09-19 LAB — COMPREHENSIVE METABOLIC PANEL
ALK PHOS: 109 U/L (ref 39–117)
ALT: 41 U/L — AB (ref 0–35)
AST: 18 U/L (ref 0–37)
Albumin: 3.9 g/dL (ref 3.5–5.2)
BILIRUBIN TOTAL: 0.4 mg/dL (ref 0.2–1.2)
BUN: 12 mg/dL (ref 6–23)
CO2: 29 mEq/L (ref 19–32)
CREATININE: 0.62 mg/dL (ref 0.50–1.10)
Calcium: 9.3 mg/dL (ref 8.4–10.5)
Chloride: 102 mEq/L (ref 96–112)
Glucose, Bld: 352 mg/dL — ABNORMAL HIGH (ref 70–99)
Potassium: 5.5 mEq/L — ABNORMAL HIGH (ref 3.5–5.3)
SODIUM: 139 meq/L (ref 135–145)
Total Protein: 6.5 g/dL (ref 6.0–8.3)

## 2013-09-19 LAB — LIPID PANEL
CHOL/HDL RATIO: 3.6 ratio
Cholesterol: 200 mg/dL (ref 0–200)
HDL: 56 mg/dL (ref >39)
LDL CALC: 115 mg/dL — AB (ref 0–99)
Triglycerides: 146 mg/dL (ref <150)
VLDL: 29 mg/dL (ref 0–40)

## 2013-09-19 LAB — GLUCOSE, POCT (MANUAL RESULT ENTRY): POC Glucose: 296 mg/dl — AB (ref 70–99)

## 2013-09-19 LAB — POCT GLYCOSYLATED HEMOGLOBIN (HGB A1C): HEMOGLOBIN A1C: 14

## 2013-09-19 MED ORDER — LISINOPRIL-HYDROCHLOROTHIAZIDE 10-12.5 MG PO TABS: 1.0000 | ORAL_TABLET | Freq: Every day | ORAL | Status: DC

## 2013-09-19 MED ORDER — TRAMADOL HCL 50 MG PO TABS: 50.0000 mg | ORAL_TABLET | Freq: Three times a day (TID) | ORAL | Status: DC | PRN

## 2013-09-19 MED ORDER — DIAZEPAM 2 MG PO TABS: 2.0000 mg | ORAL_TABLET | Freq: Three times a day (TID) | ORAL | Status: DC | PRN

## 2013-09-19 MED ORDER — PROPRANOLOL HCL ER 60 MG PO CP24: 60.0000 mg | ORAL_CAPSULE | Freq: Every day | ORAL | Status: DC

## 2013-09-19 NOTE — Patient Instructions (Signed)
Tremor  Tremor is a rhythmic, involuntary muscular contraction characterized by oscillations (to-and-fro movements) of a part of the body. The most common of all involuntary movements, tremor can affect various body parts such as the hands, head, facial structures, vocal cords, trunk, and legs; most tremors, however, occur in the hands. Tremor often accompanies neurological disorders associated with aging. Although the disorder is not life-threatening, it can be responsible for functional disability and social embarrassment.  TREATMENT   There are many types of tremor and several ways in which tremor is classified. The most common classification is by behavioral context or position. There are five categories of tremor within this classification: resting, postural, kinetic, task-specific, and psychogenic. Resting or static tremor occurs when the muscle is at rest, for example when the hands are lying on the lap. This type of tremor is often seen in patients with Parkinson's disease. Postural tremor occurs when a patient attempts to maintain posture, such as holding the hands outstretched. Postural tremors include physiological tremor, essential tremor, tremor with basal ganglia disease (also seen in patients with Parkinson's disease), cerebellar postural tremor, tremor with peripheral neuropathy, post-traumatic tremor, and alcoholic tremor. Kinetic or intention (action) tremor occurs during purposeful movement, for example during finger-to-nose testing. Task-specific tremor appears when performing goal-oriented tasks such as handwriting, speaking, or standing. This group consists of primary writing tremor, vocal tremor, and orthostatic tremor. Psychogenic tremor occurs in both older and younger patients. The key feature of this tremor is that it dramatically lessens or disappears when the patient is distracted.  PROGNOSIS  There are some treatment options available for tremor; the appropriate treatment depends on  accurate diagnosis of the cause. Some tremors respond to treatment of the underlying condition, for example in some cases of psychogenic tremor treating the patient's underlying mental problem may cause the tremor to disappear. Also, patients with tremor due to Parkinson's disease may be treated with Levodopa drug therapy. Symptomatic drug therapy is available for several other tremors as well. For those cases of tremor in which there is no effective drug treatment, physical measures such as teaching the patient to brace the affected limb during the tremor are sometimes useful. Surgical intervention such as thalamotomy or deep brain stimulation may be useful in certain cases.  Document Released: 06/17/2002 Document Revised: 09/19/2011 Document Reviewed: 06/27/2005  ExitCare® Patient Information ©2014 [employer].

## 2013-09-19 NOTE — Progress Notes (Signed)
Patient ID: Debbie [name], female   DOB: [birth year], 53 y.o.   MRN: [id number]   CC: follow up appointment   HPI: Pt is 53 yo female, presents with family who is interpreting and providing history, needs referral to ophthalmology and refill on medications. No concerns today.   Allergies  Allergen Reactions  . Penicillins Rash   Past Medical History  Diagnosis Date  . Type II or unspecified type diabetes mellitus with unspecified complication, uncontrolled     x ~ 10 years  . Hypertension     x ~ 10 years  . Hypothyroidism   . Chest pain     a. ? MI in 2010, [place]->medically managed  . Diabetic foot ulcer   . Obesity   . Cataract    Current Outpatient Prescriptions on File Prior to Visit  Medication Sig Dispense Refill  . acetaminophen (TYLENOL) 325 MG tablet Take 2 tablets (650 mg total) by mouth every 6 (six) hours as needed.      . clotrimazole (LOTRIMIN) 1 % cream Apply 1 application topically 2 (two) times daily.  30 g  3  . fluconazole (DIFLUCAN) 150 MG tablet Take 1 tablet (150 mg total) by mouth once.  1 tablet  0  . Glucose Blood (BLOOD GLUCOSE TEST STRIPS) STRP Use as directed  200 each  12  . insulin aspart (NOVOLOG) 100 UNIT/ML injection Inject 30 Units into the skin 3 (three) times daily before meals.  6 vial  3  . insulin glargine (LANTUS) 100 UNIT/ML injection Inject 0.65 mLs (65 Units total) into the skin at bedtime.  6 vial  3  . Insulin Pen Needle (ADVOCATE INSULIN PEN NEEDLES) 31G X 5 MM MISC 4 times a day use 1 month supply for refills , can be switched to any brand  100 each  12  . Insulin Syringe-Needle U-100 (INSULIN SYRINGE 1CC/30GX5/16") 30G X 5/16" 1 ML MISC 4 times a day use 1 month supply 12 refills  100 each  12  . loteprednol (LOTEMAX) 0.5 % ophthalmic suspension Place 1 drop into both eyes 2 (two) times daily.      Marland Kitchen neomycin-polymyxin-pramoxine (NEOSPORIN PLUS) 1 % cream Apply 1 application topically 2 (two) times daily.  14.2 g  1  .  nystatin-triamcinolone ointment (MYCOLOG) Apply topically 3 (three) times daily.  30 g  0  . saxagliptin HCl (ONGLYZA) 2.5 MG TABS tablet Take 2 tablets (5 mg total) by mouth daily.  180 tablet  3  . senna (SENOKOT) 8.6 MG TABS Take 2 tablets (17.2 mg total) by mouth at bedtime as needed (for constipation).  30 each  0  . sertraline (ZOLOFT) 25 MG tablet Take 2 tablets (50 mg total) by mouth daily.  90 tablet  3  . sulfamethoxazole-trimethoprim (SEPTRA DS) 800-160 MG per tablet Take 1 tablet by mouth 2 (two) times daily.  20 tablet  0   No current facility-administered medications on file prior to visit.   Family History  Problem Relation Age of Onset  . Diabetes Father     died in his 15's?  . Diabetes Father     died in her 69's?  . Diabetes Sister     alive and well.   History   Social History  . Marital Status: Single    Spouse Name: N/A    Number of Children: N/A  . Years of Education: N/A   Occupational History  . Not on file.   Social History Main Topics  .  Smoking status: Never Smoker   . Smokeless tobacco: Not on file  . Alcohol Use: No  . Drug Use: No  . Sexual Activity: No   Other Topics Concern  . Not on file   Social History Narrative   From [place], [place].  Moved to [place] to live with sister in April of 2014.  She does not routinely exercise.    Review of Systems  Constitutional: Negative for fever, chills, diaphoresis, activity change, appetite change and fatigue.  HENT: Negative for ear pain, nosebleeds, congestion, facial swelling, rhinorrhea, neck pain, neck stiffness and ear discharge.   Eyes: Negative for pain, discharge, redness, itching and visual disturbance.  Respiratory: Negative for cough, choking, chest tightness, shortness of breath, wheezing and stridor.   Cardiovascular: Negative for chest pain, palpitations and leg swelling.  Gastrointestinal: Negative for abdominal distention.  Genitourinary: Negative for dysuria, urgency, frequency,  hematuria, flank pain, decreased urine volume, difficulty urinating and dyspareunia.  Musculoskeletal: Negative for back pain, joint swelling, arthralgias and gait problem.  Neurological: Negative for dizziness, tremors, seizures, syncope, facial asymmetry, speech difficulty, weakness, light-headedness, numbness and headaches.  Hematological: Negative for adenopathy. Does not bruise/bleed easily.  Psychiatric/Behavioral: Negative for hallucinations, behavioral problems, confusion, dysphoric mood, decreased concentration and agitation.    Objective:   Filed Vitals:    Physical Exam  Constitutional: Appears well-developed and well-nourished. No distress.  Pulmonary: Effort and breath sounds normal, no stridor, rhonchi, wheezes, rales.  Abdominal: Soft. BS +,  no distension, tenderness, rebound or guarding.  Musculoskeletal: Normal range of motion. No edema and no tenderness.   Lab Results  Component Value Date   WBC 8.3 02/14/2013   HGB 14.8 02/14/2013   HCT 42.3 02/14/2013   MCV 79.4 02/14/2013   PLT 239 02/14/2013   Lab Results  Component Value Date   CREATININE 0.57 02/14/2013   BUN 18 02/14/2013   NA 138 02/14/2013   K 4.1 02/14/2013   CL 102 02/14/2013   CO2 25 02/14/2013    Lab Results  Component Value Date   HGBA1C 14.0 09/19/2013   Lipid Panel  No results found for this basename: chol, trig, hdl, cholhdl, vldl, ldlcalc       Assessment and plan:   Patient Active Problem List   Diagnosis Date Noted  . Uncontrolled diabetes mellitus - pt refusing blood work today, discussed checking blood CBG regularly and to write it down and bring to the clinic next visit so that we can discuss appropriate management  08/02/2013  . HTN (hypertension) - continue current medical regimen 11/09/2012

## 2013-09-19 NOTE — Progress Notes (Signed)
Pt here to f/u uncontrolled diabetes Pt is taking Novolog 30 units TID with Lantus 56 units Pt refused vital signs Need A1c,CBG

## 2013-09-20 ENCOUNTER — Telehealth: Payer: Self-pay | Admitting: Internal Medicine

## 2013-09-20 NOTE — Telephone Encounter (Signed)
Pt has not received call regarding results yet.

## 2013-09-24 ENCOUNTER — Ambulatory Visit: Payer: Self-pay

## 2013-09-25 NOTE — Telephone Encounter (Signed)
Labs was done on 09/19/13

## 2013-11-18 ENCOUNTER — Encounter (HOSPITAL_COMMUNITY): Payer: Self-pay | Admitting: Emergency Medicine

## 2013-11-18 ENCOUNTER — Inpatient Hospital Stay (HOSPITAL_COMMUNITY)
Admission: EM | Admit: 2013-11-18 | Discharge: 2013-11-21 | DRG: 638 | Disposition: A | Discharge status: Home / Self Care | Payer: Medicaid Other | Attending: Internal Medicine | Admitting: Internal Medicine

## 2013-11-18 DIAGNOSIS — H548 Legal blindness, as defined in USA: Secondary | ICD-10-CM | POA: Diagnosis present

## 2013-11-18 DIAGNOSIS — E1139 Type 2 diabetes mellitus with other diabetic ophthalmic complication: Secondary | ICD-10-CM | POA: Diagnosis present

## 2013-11-18 DIAGNOSIS — F329 Major depressive disorder, single episode, unspecified: Secondary | ICD-10-CM

## 2013-11-18 DIAGNOSIS — F22 Delusional disorders: Secondary | ICD-10-CM | POA: Diagnosis present

## 2013-11-18 DIAGNOSIS — Z7401 Bed confinement status: Secondary | ICD-10-CM

## 2013-11-18 DIAGNOSIS — Z9119 Patient's noncompliance with other medical treatment and regimen: Secondary | ICD-10-CM

## 2013-11-18 DIAGNOSIS — Z993 Dependence on wheelchair: Secondary | ICD-10-CM

## 2013-11-18 DIAGNOSIS — Z880 Allergy status to penicillin: Secondary | ICD-10-CM

## 2013-11-18 DIAGNOSIS — R45851 Suicidal ideations: Secondary | ICD-10-CM

## 2013-11-18 DIAGNOSIS — F332 Major depressive disorder, recurrent severe without psychotic features: Secondary | ICD-10-CM | POA: Diagnosis present

## 2013-11-18 DIAGNOSIS — H269 Unspecified cataract: Secondary | ICD-10-CM | POA: Diagnosis present

## 2013-11-18 DIAGNOSIS — E875 Hyperkalemia: Secondary | ICD-10-CM

## 2013-11-18 DIAGNOSIS — I10 Essential (primary) hypertension: Secondary | ICD-10-CM | POA: Diagnosis present

## 2013-11-18 DIAGNOSIS — Z9851 Tubal ligation status: Secondary | ICD-10-CM

## 2013-11-18 DIAGNOSIS — R739 Hyperglycemia, unspecified: Secondary | ICD-10-CM | POA: Diagnosis present

## 2013-11-18 DIAGNOSIS — E669 Obesity, unspecified: Secondary | ICD-10-CM | POA: Diagnosis present

## 2013-11-18 DIAGNOSIS — Z833 Family history of diabetes mellitus: Secondary | ICD-10-CM

## 2013-11-18 DIAGNOSIS — Z91199 Patient's noncompliance with other medical treatment and regimen due to unspecified reason: Secondary | ICD-10-CM

## 2013-11-18 DIAGNOSIS — E039 Hypothyroidism, unspecified: Secondary | ICD-10-CM | POA: Diagnosis present

## 2013-11-18 DIAGNOSIS — E11319 Type 2 diabetes mellitus with unspecified diabetic retinopathy without macular edema: Secondary | ICD-10-CM | POA: Diagnosis present

## 2013-11-18 DIAGNOSIS — Z794 Long term (current) use of insulin: Secondary | ICD-10-CM

## 2013-11-18 DIAGNOSIS — Z6841 Body Mass Index (BMI) 40.0 and over, adult: Secondary | ICD-10-CM

## 2013-11-18 DIAGNOSIS — E1165 Type 2 diabetes mellitus with hyperglycemia: Secondary | ICD-10-CM | POA: Diagnosis present

## 2013-11-18 DIAGNOSIS — F32A Depression, unspecified: Secondary | ICD-10-CM

## 2013-11-18 DIAGNOSIS — Z79899 Other long term (current) drug therapy: Secondary | ICD-10-CM

## 2013-11-18 DIAGNOSIS — E860 Dehydration: Secondary | ICD-10-CM | POA: Diagnosis present

## 2013-11-18 DIAGNOSIS — F4323 Adjustment disorder with mixed anxiety and depressed mood: Secondary | ICD-10-CM | POA: Diagnosis present

## 2013-11-18 DIAGNOSIS — Z609 Problem related to social environment, unspecified: Secondary | ICD-10-CM

## 2013-11-18 DIAGNOSIS — I252 Old myocardial infarction: Secondary | ICD-10-CM

## 2013-11-18 DIAGNOSIS — IMO0002 Reserved for concepts with insufficient information to code with codable children: Principal | ICD-10-CM | POA: Diagnosis present

## 2013-11-18 DIAGNOSIS — E1169 Type 2 diabetes mellitus with other specified complication: Principal | ICD-10-CM

## 2013-11-18 HISTORY — DX: Unspecified visual loss: H54.7

## 2013-11-18 LAB — COMPREHENSIVE METABOLIC PANEL
ALK PHOS: 147 U/L — AB (ref 39–117)
ALT: 58 U/L — AB (ref 0–35)
AST: 32 U/L (ref 0–37)
Albumin: 3.6 g/dL (ref 3.5–5.2)
BUN: 22 mg/dL (ref 6–23)
CO2: 23 meq/L (ref 19–32)
Calcium: 9.5 mg/dL (ref 8.4–10.5)
Chloride: 92 mEq/L — ABNORMAL LOW (ref 96–112)
Creatinine, Ser: 0.65 mg/dL (ref 0.50–1.10)
GLUCOSE: 800 mg/dL — AB (ref 70–99)
POTASSIUM: 6 meq/L — AB (ref 3.7–5.3)
SODIUM: 131 meq/L — AB (ref 137–147)
Total Bilirubin: 0.3 mg/dL (ref 0.3–1.2)
Total Protein: 7.4 g/dL (ref 6.0–8.3)

## 2013-11-18 LAB — URINALYSIS, ROUTINE W REFLEX MICROSCOPIC
Bilirubin Urine: NEGATIVE
Glucose, UA: >1000 mg/dL — AB
HGB URINE DIPSTICK: NEGATIVE
KETONES UR: NEGATIVE mg/dL
Leukocytes, UA: NEGATIVE
NITRITE: NEGATIVE
Protein, ur: NEGATIVE mg/dL
Specific Gravity, Urine: 1.034 — ABNORMAL HIGH (ref 1.005–1.030)
Urobilinogen, UA: 0.2 mg/dL (ref 0.0–1.0)
pH: 5 (ref 5.0–8.0)

## 2013-11-18 LAB — CBC
HCT: 46.6 % — ABNORMAL HIGH (ref 36.0–46.0)
Hemoglobin: 15.7 g/dL — ABNORMAL HIGH (ref 12.0–15.0)
MCH: 27.2 pg (ref 26.0–34.0)
MCHC: 33.7 g/dL (ref 30.0–36.0)
MCV: 80.8 fL (ref 78.0–100.0)
PLATELETS: 240 10*3/uL (ref 150–400)
RBC: 5.77 MIL/uL — AB (ref 3.87–5.11)
RDW: 12.8 % (ref 11.5–15.5)
WBC: 9.3 10*3/uL (ref 4.0–10.5)

## 2013-11-18 LAB — URINE MICROSCOPIC-ADD ON

## 2013-11-18 LAB — CBG MONITORING, ED

## 2013-11-18 NOTE — ED Notes (Signed)
Lab critical result, blood glucose 800. Candiss Norse RN made aware.

## 2013-11-18 NOTE — ED Notes (Signed)
Pt is loudly chanting in Arabic-daughter at bedside to interpret-states she is chanting that "she just wants to die"-pt's daughter states she has been non-compliant with all of her medications-states she has been in the country [place] 1 year-daughter states pt only speaks Arabic-2nd PIV initiated and blood work drawn-peri-care given/yeast noted to perineum, cleaned well and in and out completed and obtained 850 cc's urine-to lab/repositioned in bed for comfort-MP ST with no ectopy noted-NS infusing per left ACF at [facility] rate

## 2013-11-18 NOTE — ED Notes (Signed)
Patient is alert with a chronic altered mental status.  Patient is [place] and speaks no english.  Daughter at bedside speaks [place].   EMS called to home for high blood sugar.  Found patient screaming screaming hysterically with sinus tach.  22g left hand with a  Blood sugar that read "Hi"

## 2013-11-18 NOTE — ED Notes (Signed)
Bed: WA09 Expected date:  Expected time:  Means of arrival:  Comments: EMS 

## 2013-11-19 ENCOUNTER — Encounter (HOSPITAL_COMMUNITY): Payer: Self-pay | Admitting: *Deleted

## 2013-11-19 DIAGNOSIS — I10 Essential (primary) hypertension: Secondary | ICD-10-CM

## 2013-11-19 DIAGNOSIS — R739 Hyperglycemia, unspecified: Secondary | ICD-10-CM | POA: Diagnosis present

## 2013-11-19 DIAGNOSIS — R45851 Suicidal ideations: Secondary | ICD-10-CM

## 2013-11-19 DIAGNOSIS — E11319 Type 2 diabetes mellitus with unspecified diabetic retinopathy without macular edema: Secondary | ICD-10-CM

## 2013-11-19 DIAGNOSIS — E1110 Type 2 diabetes mellitus with ketoacidosis without coma: Secondary | ICD-10-CM | POA: Insufficient documentation

## 2013-11-19 DIAGNOSIS — E1139 Type 2 diabetes mellitus with other diabetic ophthalmic complication: Secondary | ICD-10-CM

## 2013-11-19 DIAGNOSIS — E875 Hyperkalemia: Secondary | ICD-10-CM

## 2013-11-19 DIAGNOSIS — F332 Major depressive disorder, recurrent severe without psychotic features: Secondary | ICD-10-CM

## 2013-11-19 LAB — BASIC METABOLIC PANEL
BUN: 16 mg/dL (ref 6–23)
CO2: 26 mEq/L (ref 19–32)
Calcium: 8.8 mg/dL (ref 8.4–10.5)
Chloride: 105 mEq/L (ref 96–112)
Creatinine, Ser: 0.55 mg/dL (ref 0.50–1.10)
GFR calc Af Amer: >90 mL/min (ref >90)
GFR calc non Af Amer: >90 mL/min (ref >90)
Glucose, Bld: 336 mg/dL — ABNORMAL HIGH (ref 70–99)
Potassium: 4.2 mEq/L (ref 3.7–5.3)
Sodium: 141 mEq/L (ref 137–147)

## 2013-11-19 LAB — GLUCOSE, CAPILLARY
GLUCOSE-CAPILLARY: 358 mg/dL — AB (ref 70–99)
GLUCOSE-CAPILLARY: 301 mg/dL — AB (ref 70–99)
GLUCOSE-CAPILLARY: 229 mg/dL — AB (ref 70–99)
GLUCOSE-CAPILLARY: 189 mg/dL — AB (ref 70–99)
GLUCOSE-CAPILLARY: 143 mg/dL — AB (ref 70–99)
GLUCOSE-CAPILLARY: 148 mg/dL — AB (ref 70–99)
GLUCOSE-CAPILLARY: 129 mg/dL — AB (ref 70–99)
GLUCOSE-CAPILLARY: 169 mg/dL — AB (ref 70–99)
Glucose-Capillary: 169 mg/dL — ABNORMAL HIGH (ref 70–99)
Glucose-Capillary: 179 mg/dL — ABNORMAL HIGH (ref 70–99)
Glucose-Capillary: 172 mg/dL — ABNORMAL HIGH (ref 70–99)

## 2013-11-19 LAB — CBC
HCT: 41.8 % (ref 36.0–46.0)
Hemoglobin: 13.9 g/dL (ref 12.0–15.0)
MCH: 26.8 pg (ref 26.0–34.0)
MCHC: 33.3 g/dL (ref 30.0–36.0)
MCV: 80.5 fL (ref 78.0–100.0)
Platelets: 214 10*3/uL (ref 150–400)
RBC: 5.19 MIL/uL — ABNORMAL HIGH (ref 3.87–5.11)
RDW: 13 % (ref 11.5–15.5)
WBC: 10.1 10*3/uL (ref 4.0–10.5)

## 2013-11-19 LAB — HEMOGLOBIN A1C
Hgb A1c MFr Bld: 15.1 % — ABNORMAL HIGH (ref <5.7)
Mean Plasma Glucose: 387 mg/dL — ABNORMAL HIGH (ref <117)

## 2013-11-19 LAB — CBG MONITORING, ED: GLUCOSE-CAPILLARY: 543 mg/dL — AB (ref 70–99)

## 2013-11-19 MED ORDER — ONDANSETRON HCL 4 MG PO TABS: 4.0000 mg | ORAL_TABLET | Freq: Four times a day (QID) | ORAL | Status: DC | PRN

## 2013-11-19 MED ORDER — ACETAMINOPHEN 325 MG PO TABS: 650.0000 mg | ORAL_TABLET | Freq: Four times a day (QID) | ORAL | Status: DC | PRN

## 2013-11-19 MED ORDER — PROPRANOLOL HCL ER 60 MG PO CP24
60.0000 mg | ORAL_CAPSULE | Freq: Every day | ORAL | Status: DC
  Administered 2013-11-19 – 2013-11-21 (×3): 60 mg via ORAL
  Filled 2013-11-19 (×4): qty 1

## 2013-11-19 MED ORDER — DOCUSATE SODIUM 100 MG PO CAPS
100.0000 mg | ORAL_CAPSULE | Freq: Two times a day (BID) | ORAL | Status: DC
  Administered 2013-11-19 – 2013-11-21 (×5): 100 mg via ORAL
  Filled 2013-11-19 (×7): qty 1

## 2013-11-19 MED ORDER — SODIUM CHLORIDE 0.9 % IV SOLN: INTRAVENOUS | Status: DC

## 2013-11-19 MED ORDER — SODIUM CHLORIDE 0.9 % IV SOLN
INTRAVENOUS | Status: AC
  Administered 2013-11-19: 4.8 [IU]/h via INTRAVENOUS
  Filled 2013-11-19: qty 1

## 2013-11-19 MED ORDER — LIVING WELL WITH DIABETES BOOK
Freq: Once | Status: AC
  Administered 2013-11-19: 17:00:00
  Filled 2013-11-19: qty 1

## 2013-11-19 MED ORDER — ONDANSETRON HCL 4 MG/2ML IJ SOLN
4.0000 mg | Freq: Four times a day (QID) | INTRAMUSCULAR | Status: DC | PRN
  Administered 2013-11-20: 4 mg via INTRAVENOUS
  Filled 2013-11-19: qty 2

## 2013-11-19 MED ORDER — BISACODYL 10 MG RE SUPP: 10.0000 mg | Freq: Every day | RECTAL | Status: DC | PRN

## 2013-11-19 MED ORDER — INSULIN ASPART 100 UNIT/ML ~~LOC~~ SOLN
0.0000 [IU] | Freq: Three times a day (TID) | SUBCUTANEOUS | Status: DC
  Administered 2013-11-19 – 2013-11-20 (×2): 3 [IU] via SUBCUTANEOUS
  Administered 2013-11-20: 2 [IU] via SUBCUTANEOUS
  Administered 2013-11-20: 3 [IU] via SUBCUTANEOUS

## 2013-11-19 MED ORDER — HEPARIN SODIUM (PORCINE) 5000 UNIT/ML IJ SOLN
5000.0000 [IU] | Freq: Three times a day (TID) | INTRAMUSCULAR | Status: DC
  Administered 2013-11-19 – 2013-11-21 (×7): 5000 [IU] via SUBCUTANEOUS
  Filled 2013-11-19 (×10): qty 1

## 2013-11-19 MED ORDER — SODIUM CHLORIDE 0.9 % IV SOLN
INTRAVENOUS | Status: DC
  Administered 2013-11-19: 01:00:00 via INTRAVENOUS

## 2013-11-19 MED ORDER — SERTRALINE HCL 50 MG PO TABS
50.0000 mg | ORAL_TABLET | Freq: Every day | ORAL | Status: DC
  Administered 2013-11-19 – 2013-11-20 (×2): 50 mg via ORAL
  Filled 2013-11-19 (×2): qty 1

## 2013-11-19 MED ORDER — INSULIN ASPART 100 UNIT/ML ~~LOC~~ SOLN: 0.0000 [IU] | Freq: Every day | SUBCUTANEOUS | Status: DC

## 2013-11-19 MED ORDER — SODIUM CHLORIDE 0.9 % IV BOLUS (SEPSIS): 1000.0000 mL | Freq: Once | INTRAVENOUS | Status: DC

## 2013-11-19 MED ORDER — INSULIN REGULAR BOLUS VIA INFUSION
0.0000 [IU] | Freq: Three times a day (TID) | INTRAVENOUS | Status: DC
  Filled 2013-11-19: qty 10

## 2013-11-19 MED ORDER — SODIUM CHLORIDE 0.9 % IV SOLN
INTRAVENOUS | Status: DC
  Administered 2013-11-19 – 2013-11-20 (×3): via INTRAVENOUS

## 2013-11-19 MED ORDER — POLYETHYLENE GLYCOL 3350 17 G PO PACK
17.0000 g | PACK | Freq: Every day | ORAL | Status: DC | PRN
  Filled 2013-11-19: qty 1

## 2013-11-19 MED ORDER — DEXTROSE 50 % IV SOLN: 25.0000 mL | INTRAVENOUS | Status: DC | PRN

## 2013-11-19 MED ORDER — HALOPERIDOL LACTATE 5 MG/ML IJ SOLN
1.0000 mg | INTRAMUSCULAR | Status: DC | PRN
  Administered 2013-11-19: 1 mg via INTRAVENOUS
  Filled 2013-11-19 (×2): qty 1

## 2013-11-19 MED ORDER — SODIUM CHLORIDE 0.9 % IJ SOLN
3.0000 mL | Freq: Two times a day (BID) | INTRAMUSCULAR | Status: DC
  Administered 2013-11-19 – 2013-11-21 (×4): 3 mL via INTRAVENOUS

## 2013-11-19 MED ORDER — SENNA 8.6 MG PO TABS: 2.0000 | ORAL_TABLET | Freq: Every evening | ORAL | Status: DC | PRN

## 2013-11-19 MED ORDER — INSULIN GLARGINE 100 UNIT/ML ~~LOC~~ SOLN
30.0000 [IU] | Freq: Every day | SUBCUTANEOUS | Status: DC
  Administered 2013-11-19 – 2013-11-21 (×3): 30 [IU] via SUBCUTANEOUS
  Filled 2013-11-19 (×3): qty 0.3

## 2013-11-19 MED ORDER — DEXTROSE-NACL 5-0.45 % IV SOLN
INTRAVENOUS | Status: DC
  Administered 2013-11-19: 05:00:00 via INTRAVENOUS

## 2013-11-19 MED ORDER — ACETAMINOPHEN 650 MG RE SUPP: 650.0000 mg | Freq: Four times a day (QID) | RECTAL | Status: DC | PRN

## 2013-11-19 MED ORDER — DIAZEPAM 2 MG PO TABS: 2.0000 mg | ORAL_TABLET | Freq: Three times a day (TID) | ORAL | Status: DC | PRN

## 2013-11-19 NOTE — Consult Note (Signed)
Debbie [name]   Reason for [name]:  Depression and suicidal thoughts Referring Physician:  Dr Shelda Pal Debbie [name] is an 53 y.o. female. Total Time spent with patient: 20 minutes  Assessment: AXIS I:  Major Depression, Recurrent severe AXIS II:  Deferred AXIS III:   Past Medical History  Diagnosis Date  . Type II or unspecified type diabetes mellitus with unspecified complication, uncontrolled     x ~ 10 years  . Hypertension     x ~ 10 years  . Hypothyroidism   . Chest pain     a. ? MI in 2010, [place]->medically managed  . Diabetic foot ulcer   . Obesity   . Cataract   . Blind    AXIS IV:  other psychosocial or environmental problems, problems related to social environment and problems with primary support group AXIS V:  21-30 behavior considerably influenced by delusions or hallucinations OR serious impairment in judgment, communication OR inability to function in almost all areas  Plan:  Medication management  Subjective:   Debbie [name] is a 53 y.o. female patient admitted with hyperglycemia.  HPI:  Patient seen chart reviewed.  Patient is 53 year old [name] female who only speaks Arabic admitted to the medical floor because of high blood sugar.  As per chart patient has been not taking her medication, she is history of diabetes mellitus.  Information was obtained through telephone translation.  Apparently patient has history of depression for some time but she does not take her medication on time.  As per the translator patient is minimally cooperative and did not provide a lot of information.  Patient admitted having issues with her sister.  She also endorsed lately more depressed, poor sleep, crying spells and having suicidal thoughts.  She also endorsed that she stopped taking medication because she wanted to die.  Patient has a vision problem and she is blind.  Recently she was very disappointed when her physician refused to do eye surgery.  However  details are unknown.  Patient denies any hallucination, paranoia but admitted feeling hopeless, worthless and does not want to live more.  Currently she is taking Zoloft.  She is willing to try a higher dose of Zoloft.   Past Psychiatric History: Past Medical History  Diagnosis Date  . Type II or unspecified type diabetes mellitus with unspecified complication, uncontrolled     x ~ 10 years  . Hypertension     x ~ 10 years  . Hypothyroidism   . Chest pain     a. ? MI in 2010, [place]->medically managed  . Diabetic foot ulcer   . Obesity   . Cataract   . Blind     reports that she has never smoked. She does not have any smokeless tobacco history on file. She reports that she does not drink alcohol or use illicit drugs. Family History  Problem Relation Age of Onset  . Diabetes Father     died in his 6's?  . Diabetes Father     died in her 52's?  . Diabetes Sister     alive and well.     Living Arrangements: Other relatives   Abuse/Neglect [facility]) Physical Abuse: Denies Verbal Abuse: Denies Sexual Abuse: Denies Allergies:   Allergies  Allergen Reactions  . Penicillins Rash    ACT Assessment Complete:  No:   Past Psychiatric History: History of depression.  History of suicidal attempt when she cut her wrist but they do not have detailed information.  Currently she is not seeing any psychiatrist.  She lives with her sister.  She has never married and she has no children.  Objective: Blood pressure 128/68, pulse 80, temperature 98.5 F (36.9 C), temperature source Oral, resp. rate 20, height _0  (1.575 m), weight 229 lb 8 oz (104.101 kg), last menstrual period 05/21/2013, SpO2 95.00%.Body mass index is 41.97 kg/(m^2). Results for orders placed during the hospital encounter of 11/18/13 (from the past 72 hour(s))  CBC     Status: Abnormal   Collection Time    11/18/13 10:10 PM      Result Value Ref Range   WBC 9.3  4.0 - 10.5 K/uL   RBC 5.77 (*) 3.87 - 5.11 MIL/uL    Hemoglobin 15.7 (*) 12.0 - 15.0 g/dL   HCT 46.6 (*) 36.0 - 46.0 %   MCV 80.8  78.0 - 100.0 fL   MCH 27.2  26.0 - 34.0 pg   MCHC 33.7  30.0 - 36.0 g/dL   RDW 12.8  11.5 - 15.5 %   Platelets 240  150 - 400 K/uL  COMPREHENSIVE METABOLIC PANEL     Status: Abnormal   Collection Time    11/18/13 10:10 PM      Result Value Ref Range   Sodium 131 (*) 137 - 147 mEq/L   Potassium 6.0 (*) 3.7 - 5.3 mEq/L   Chloride 92 (*) 96 - 112 mEq/L   CO2 23  19 - 32 mEq/L   Glucose, Bld 800 (*) 70 - 99 mg/dL   Comment: CRITICAL RESULT CALLED TO, READ BACK BY AND VERIFIED WITH:     SPOKE WITH JOHNSON,J RN [phone] COVINGTON,N   BUN 22  6 - 23 mg/dL   Creatinine, Ser 0.65  0.50 - 1.10 mg/dL   Calcium 9.5  8.4 - 10.5 mg/dL   Total Protein 7.4  6.0 - 8.3 g/dL   Albumin 3.6  3.5 - 5.2 g/dL   AST 32  0 - 37 U/L   ALT 58 (*) 0 - 35 U/L   Alkaline Phosphatase 147 (*) 39 - 117 U/L   Total Bilirubin 0.3  0.3 - 1.2 mg/dL   GFR calc non Af Amer >90  >90 mL/min   GFR calc Af Amer >90  >90 mL/min   Comment: (NOTE)     The eGFR has been calculated using the CKD EPI equation.     This calculation has not been validated in all clinical situations.     eGFR's persistently <90 mL/min signify possible Chronic Kidney     Disease.  URINALYSIS, ROUTINE W REFLEX MICROSCOPIC     Status: Abnormal   Collection Time    11/18/13 10:31 PM      Result Value Ref Range   Color, Urine YELLOW  YELLOW   APPearance CLEAR  CLEAR   Specific Gravity, Urine 1.034 (*) 1.005 - 1.030   pH 5.0  5.0 - 8.0   Glucose, UA >1000 (*) NEGATIVE mg/dL   Hgb urine dipstick NEGATIVE  NEGATIVE   Bilirubin Urine NEGATIVE  NEGATIVE   Ketones, ur NEGATIVE  NEGATIVE mg/dL   Protein, ur NEGATIVE  NEGATIVE mg/dL   Urobilinogen, UA 0.2  0.0 - 1.0 mg/dL   Nitrite NEGATIVE  NEGATIVE   Leukocytes, UA NEGATIVE  NEGATIVE  URINE MICROSCOPIC-ADD ON     Status: None   Collection Time    11/18/13 10:31 PM      Result Value Ref Range   WBC, UA 0-2  <  3  WBC/hpf   RBC / HPF 0-2  <3 RBC/hpf  CBG MONITORING, ED     Status: Abnormal   Collection Time    11/18/13 10:47 PM      Result Value Ref Range   Glucose-Capillary >600 (*) 70 - 99 mg/dL  CBG MONITORING, ED     Status: Abnormal   Collection Time    11/19/13 12:39 AM      Result Value Ref Range   Glucose-Capillary 543 (*) 70 - 99 mg/dL   Comment 1 Documented in Chart     Comment 2 Notify RN    GLUCOSE, CAPILLARY     Status: Abnormal   Collection Time    11/19/13  2:39 AM      Result Value Ref Range   Glucose-Capillary 358 (*) 70 - 99 mg/dL  GLUCOSE, CAPILLARY     Status: Abnormal   Collection Time    11/19/13  3:45 AM      Result Value Ref Range   Glucose-Capillary 301 (*) 70 - 99 mg/dL  BASIC METABOLIC PANEL     Status: Abnormal   Collection Time    11/19/13  3:55 AM      Result Value Ref Range   Sodium 141  137 - 147 mEq/L   Comment: DELTA CHECK NOTED     REPEATED TO VERIFY   Potassium 4.2  3.7 - 5.3 mEq/L   Comment: DELTA CHECK NOTED     REPEATED TO VERIFY   Chloride 105  96 - 112 mEq/L   Comment: DELTA CHECK NOTED     REPEATED TO VERIFY   CO2 26  19 - 32 mEq/L   Glucose, Bld 336 (*) 70 - 99 mg/dL   BUN 16  6 - 23 mg/dL   Creatinine, Ser 0.55  0.50 - 1.10 mg/dL   Calcium 8.8  8.4 - 10.5 mg/dL   GFR calc non Af Amer >90  >90 mL/min   GFR calc Af Amer >90  >90 mL/min   Comment: (NOTE)     The eGFR has been calculated using the CKD EPI equation.     This calculation has not been validated in all clinical situations.     eGFR's persistently <90 mL/min signify possible Chronic Kidney     Disease.  CBC     Status: Abnormal   Collection Time    11/19/13  3:55 AM      Result Value Ref Range   WBC 10.1  4.0 - 10.5 K/uL   RBC 5.19 (*) 3.87 - 5.11 MIL/uL   Hemoglobin 13.9  12.0 - 15.0 g/dL   HCT 41.8  36.0 - 46.0 %   MCV 80.5  78.0 - 100.0 fL   MCH 26.8  26.0 - 34.0 pg   MCHC 33.3  30.0 - 36.0 g/dL   RDW 13.0  11.5 - 15.5 %   Platelets 214  150 - 400 K/uL   GLUCOSE, CAPILLARY     Status: Abnormal   Collection Time    11/19/13  5:20 AM      Result Value Ref Range   Glucose-Capillary 229 (*) 70 - 99 mg/dL  GLUCOSE, CAPILLARY     Status: Abnormal   Collection Time    11/19/13  6:24 AM      Result Value Ref Range   Glucose-Capillary 189 (*) 70 - 99 mg/dL  GLUCOSE, CAPILLARY     Status: Abnormal   Collection Time    11/19/13  7:31 AM  Result Value Ref Range   Glucose-Capillary 169 (*) 70 - 99 mg/dL  GLUCOSE, CAPILLARY     Status: Abnormal   Collection Time    11/19/13  8:31 AM      Result Value Ref Range   Glucose-Capillary 143 (*) 70 - 99 mg/dL  GLUCOSE, CAPILLARY     Status: Abnormal   Collection Time    11/19/13  9:30 AM      Result Value Ref Range   Glucose-Capillary 179 (*) 70 - 99 mg/dL  GLUCOSE, CAPILLARY     Status: Abnormal   Collection Time    11/19/13 10:42 AM      Result Value Ref Range   Glucose-Capillary 148 (*) 70 - 99 mg/dL  HEMOGLOBIN A1C     Status: Abnormal   Collection Time    11/19/13 11:11 AM      Result Value Ref Range   Hemoglobin A1C 15.1 (*) <5.7 %   Comment: (NOTE)                                                                               According to the ADA Clinical Practice Recommendations for 2011, when     HbA1c is used as a screening test:      >=6.5%   Diagnostic of Diabetes Mellitus               (if abnormal result is confirmed)     5.7-6.4%   Increased risk of developing Diabetes Mellitus     References:Diagnosis and Classification of Diabetes Mellitus,Diabetes     YKDX,8338,25(KNLZJ 1):S62-S69 and Standards of Medical Care in             Diabetes - 2011,Diabetes Care,2011,34 (Suppl 1):S11-S61.   Mean Plasma Glucose 387 (*) <117 mg/dL   Comment: Performed at [facility], [facility]     Status: Abnormal   Collection Time    11/19/13 11:51 AM      Result Value Ref Range   Glucose-Capillary 129 (*) 70 - 99 mg/dL   Comment 1 Notify RN     Labs are  reviewed.  Current Facility-Administered Medications  Medication Dose Route Frequency Provider Last Rate Last Dose  . 0.9 %  sodium chloride infusion   Intravenous Continuous Bonnielee Haff, MD 100 mL/hr at 11/19/13 1346    . acetaminophen (TYLENOL) tablet 650 mg  650 mg Oral Q6H PRN Phillips Climes, MD       Or  . acetaminophen (TYLENOL) suppository 650 mg  650 mg Rectal Q6H PRN Phillips Climes, MD      . bisacodyl (DULCOLAX) suppository 10 mg  10 mg Rectal Daily PRN Phillips Climes, MD      . dextrose 50 % solution 25 mL  25 mL Intravenous PRN Richarda Blade, MD      . diazepam (VALIUM) tablet 2 mg  2 mg Oral Q8H PRN Phillips Climes, MD      . docusate sodium (COLACE) capsule 100 mg  100 mg Oral BID Phillips Climes, MD   100 mg at 11/19/13 1047  . haloperidol lactate (HALDOL) injection 1 mg  1 mg Intravenous Q4H PRN Phillips Climes, MD      .  heparin injection 5,000 Units  5,000 Units Subcutaneous 3 times per day Phillips Climes, MD   5,000 Units at 11/19/13 1346  . insulin aspart (novoLOG) injection 0-15 Units  0-15 Units Subcutaneous TID WC Bonnielee Haff, MD      . insulin aspart (novoLOG) injection 0-5 Units  0-5 Units Subcutaneous QHS Bonnielee Haff, MD      . insulin glargine (LANTUS) injection 30 Units  30 Units Subcutaneous Daily Bonnielee Haff, MD   30 Units at 11/19/13 1047  . ondansetron (ZOFRAN) tablet 4 mg  4 mg Oral Q6H PRN Phillips Climes, MD       Or  . ondansetron (ZOFRAN) injection 4 mg  4 mg Intravenous Q6H PRN Dawood Elgergawy, MD      . polyethylene glycol (MIRALAX / GLYCOLAX) packet 17 g  17 g Oral Daily PRN Phillips Climes, MD      . propranolol ER (INDERAL LA) 24 hr capsule 60 mg  60 mg Oral Daily Phillips Climes, MD   60 mg at 11/19/13 1048  . senna (SENOKOT) tablet 17.2 mg  2 tablet Oral QHS PRN Phillips Climes, MD      . sertraline (ZOLOFT) tablet 50 mg  50 mg Oral Daily Phillips Climes, MD   50 mg at 11/19/13 1047  . sodium chloride 0.9 % bolus 1,000  mL  1,000 mL Intravenous Once Phillips Climes, MD      . sodium chloride 0.9 % injection 3 mL  3 mL Intravenous Q12H Phillips Climes, MD   3 mL at 11/19/13 1000    Psychiatric Specialty Exam:     Blood pressure 128/68, pulse 80, temperature 98.5 F (36.9 C), temperature source Oral, resp. rate 20, height _0  (1.575 m), weight 229 lb 8 oz (104.101 kg), last menstrual period 05/21/2013, SpO2 95.00%.Body mass index is 41.97 kg/(m^2).  General Appearance: Guarded  Eye Contact::  Poor  Speech:  Slow  Volume:  Decreased  Mood:  Depressed, Dysphoric and Hopeless  Affect:  Constricted and Depressed  Thought Process:  Intact  Orientation:  Full (Time, Place, and Person)  Thought Content:  Rumination  Suicidal Thoughts:  Yes.  with intent/plan  Homicidal Thoughts:  No  Memory:  fair  Judgement:  Impaired  Insight:  Lacking  Psychomotor Activity:  Decreased  Concentration:  Fair  Recall:  Poor  Fund of Knowledge:Fair  Language: Patient speaks Arabic  Akathisia:  No  Handed:  Right  AIMS (if indicated):     Assets:  Housing  Sleep:      Musculoskeletal: Strength & Muscle Tone: Unable to assess Gait & Station: Unable to assess Patient leans: N/A  Treatment Plan Summary: Medication management, increase Zoloft 100 mg daily if not medically contraindicated.  Continue sitter for patient safety.  Consultation liaison services will follow the patient.  Increase collateral information from the family member who speaks [place].  Please call [phone] if you have any further questions.  Debbie [name] 11/19/2013 3:57 PM

## 2013-11-19 NOTE — Evaluation (Signed)
Physical Therapy Evaluation Patient Details Name: Debbie [name] MRN: [id number] DOB: 06-20-1961 Today's Date: 11/19/2013   History of Present Illness  Debbie [name]  is a 53 y.o. female, and history of diabetes mellitus, uncontrolled, the patient was brought by her sister due to uncontrolled diabetes mellitus, and fingersticks were too high to read at home, sister gives a history, reports her sister has some psychiatric disorder, today was agitated combative and refused to take care her insulin dose today, upon presentation to ED patient was agitated screaming and psychotic, patient was found to have anion gap of 16, but there was no acute tone in her urine, and had bicarbonate of 23, and her potassium was 6, patient is refusing to comply with that physical exam, her history, patient's sister was giving the history, patient blood sugar was 800, so she was started on insulin drip in ED, and hospitalist service requested to admit the patient, as well sister reports patient has been having increased episodes of psychosis and agitation, reports she is wheelchair dependent at baseline he due to severe degenerative disc disease.  Clinical Impression  *Pt admitted with *hyperglycemia, AMS**. Pt currently with functional limitations due to the deficits listed below (see PT Problem List).  Pt will benefit from skilled PT to increase their independence and safety with mobility to allow discharge to the venue listed below.   Limited PT eval due to AMS, language barrier (interpreter phones ordered but have not arrived to room, no family present). Pt did not respond to questions and is legally blind. +2 total Assist or supine to sit on edge of bed. Pt moaned as if in pain during mobility, assisted her back to supine. Will follow. At present SNF level of care indicated.  **    Follow Up Recommendations SNF    Equipment Recommendations  None recommended by PT    Recommendations for Other Services        Precautions / Restrictions Precautions Precautions: Fall;Other (comment) Precaution Comments: pt WC bound per chart, no family present; pt legally blind Restrictions Weight Bearing Restrictions: No      Mobility  Bed Mobility Overal bed mobility: +2 for physical assistance;Needs Assistance Bed Mobility: Rolling;Supine to Sit;Sit to Supine Rolling: +2 for physical assistance;Total assist   Supine to sit: +2 for physical assistance;Total assist Sit to supine: +2 for physical assistance;Total assist   General bed mobility comments: +2 total assist for rolling R, and sidelying to sit. Pt sat on EOB x 4 minutes. She moaned as if in pain during bed mobility and while sitting on EOB. Difficult to assess pain due to language barrier, pt legally blind, and AMS.   Transfers                 General transfer comment: not attempted 2* pt seemed to be in pain and we were unable to communicate with her  Ambulation/Gait                Stairs            Wheelchair Mobility    Modified Rankin (Stroke Patients Only)       Balance Overall balance assessment: Needs assistance Sitting-balance support: Single extremity supported;Feet unsupported Sitting balance-Leahy Scale: Fair                                       Pertinent Vitals/Pain **moaned with mobility, pt unable to  indicate intensity/location of pain Noted h/o DDD in chart RN notified*    Home Living Family/patient expects to be discharged to:: Private residence Living Arrangements: Other relatives Available Help at Discharge: Available 24 hours/day;Family Type of Home: Apartment Home Access: Level entry     Home Layout: Two level Home Equipment: [place] - 2 wheels;Bedside commode;Wheelchair - manual Additional Comments: Pt not able to respond to questions due to AMS and language barrier. Interpreter phones ordered but have not yet arrived. No family present. Info obtained from chart.      Prior Function Level of Independence: Needs assistance   Gait / Transfers Assistance Needed: sister assists pt per chart, she is WC bound due to DDD  ADL's / Homemaking Assistance Needed: (A) to complete all ADLs        Hand Dominance        Extremity/Trunk Assessment   Upper Extremity Assessment: Difficult to assess due to impaired cognition           Lower Extremity Assessment: Difficult to assess due to impaired cognition      Cervical / Trunk Assessment: Normal  Communication   Communication: Prefers language other than [place] (interpreter phones ordered but haven't arrived to room)  Cognition Arousal/Alertness: Lethargic   Overall Cognitive Status: Difficult to assess                      General Comments      Exercises        Assessment/Plan    PT Assessment Patient needs continued PT services  PT Diagnosis Generalized weakness   PT Problem List Decreased activity tolerance;Pain;Decreased balance;Decreased mobility;Obesity;Decreased cognition  PT Treatment Interventions Functional mobility training;Therapeutic activities;Patient/family education;Balance training;Therapeutic exercise;Wheelchair mobility training   PT Goals (Current goals can be found in the Care Plan section) Acute Rehab PT Goals PT Goal Formulation: Patient unable to participate in goal setting Time For Goal Achievement: 12/03/13 Potential to Achieve Goals: Fair    Frequency Min 3X/week   Barriers to discharge        Co-evaluation               End of Session   Activity Tolerance: Patient limited by pain Patient left: in bed;with call bell/phone within reach;with nursing/sitter in room Nurse Communication: Mobility status         Time: 1151-1203 PT Time Calculation (min): 12 min   Charges:   PT Evaluation $Initial PT Evaluation Tier I: 1 Procedure PT Treatments $Therapeutic Activity: 8-22 mins   PT G CodesLucile Crater 11/19/2013, 12:11 PM [phone]

## 2013-11-19 NOTE — H&P (Signed)
Patient Demographics  Debbie [name], is a 53 y.o. female  MRN: [id number]   DOB - August 18, 1960  Admit Date - 11/18/2013  Outpatient Primary MD for the patient is Angelica Chessman, MD   With History of -  Past Medical History  Diagnosis Date  . Type II or unspecified type diabetes mellitus with unspecified complication, uncontrolled     x ~ 10 years  . Hypertension     x ~ 10 years  . Hypothyroidism   . Chest pain     a. ? MI in 2010, [place]->medically managed  . Diabetic foot ulcer   . Obesity   . Cataract   . Blind       Past Surgical History  Procedure Laterality Date  . Back surgery    . Tubal ligation      in for   Chief Complaint  Patient presents with  . Hyperglycemia     HPI  Debbie [name]  is a 53 y.o. female, and history of diabetes mellitus, uncontrolled, the patient was brought by her sister due to uncontrolled diabetes mellitus, and fingersticks were too high to read at home, sister gives a history, reports her sister has some psychiatric disorder, today was agitated combative and refused to take care her insulin dose today, upon presentation to ED patient was agitated screaming and psychotic, patient was found to have anion gap of 16, but there was no acute tone in her urine, and had bicarbonate of 23, and her potassium was 6, patient is refusing to comply with that physical exam, her history, patient's sister was giving the history, patient blood sugar was 800, so she was started on insulin drip in ED, and hospitalist service requested to admit the patient, as well sister reports patient has been having increased episodes of psychosis and agitation, reports she is wheelchair dependent at baseline he due to severe degenerative disc disease.   Review of Systems    Patient is unable to  provide any reliable review of system, please review the history of present illness for review of system sister was able to provide     Social History History  Substance Use Topics  . Smoking status: Never Smoker   . Smokeless tobacco: Not on file  . Alcohol Use: No    Family History Family History  Problem Relation Age of Onset  . Diabetes Father     died in his 93's?  . Diabetes Father     died in her 73's?  Marland Kitchen  Diabetes Sister     alive and well.     Prior to Admission medications   Medication Sig Start Date End Date Taking? Authorizing Provider  acetaminophen (TYLENOL) 325 MG tablet Take 2 tablets (650 mg total) by mouth every 6 (six) hours as needed. 01/02/13  Yes Nita Sells, MD  clotrimazole (LOTRIMIN) 1 % cream Apply 1 application topically 2 (two) times daily. 06/13/13  Yes Robbie Lis, MD  diazepam (VALIUM) 2 MG tablet Take 1 tablet (2 mg total) by mouth every 8 (eight) hours as needed for anxiety. 09/19/13  Yes Theodis Blaze, MD  fluconazole (DIFLUCAN) 150 MG tablet Take 1 tablet (150 mg total) by mouth once. 04/23/13  Yes Angelica Chessman, MD  insulin aspart (NOVOLOG) 100 UNIT/ML injection Inject 30 Units into the skin 3 (three) times daily before meals. 08/19/13  Yes Angelica Chessman, MD  insulin glargine (LANTUS) 100 UNIT/ML injection Inject 0.65 mLs (65 Units total) into the skin at bedtime. 08/19/13  Yes Angelica Chessman, MD  lisinopril-hydrochlorothiazide (PRINZIDE,ZESTORETIC) 10-12.5 MG per tablet Take 1 tablet by mouth daily. 09/19/13  Yes Theodis Blaze, MD  loteprednol (LOTEMAX) 0.5 % ophthalmic suspension Place 1 drop into both eyes 2 (two) times daily.   Yes Historical Provider, MD  propranolol ER (INDERAL LA) 60 MG 24 hr capsule Take 60 mg by mouth daily. 09/19/13  Yes Theodis Blaze, MD  saxagliptin HCl (ONGLYZA) 2.5 MG TABS tablet Take 2 tablets (5 mg total) by mouth daily. 08/19/13  Yes Angelica Chessman, MD  senna (SENOKOT) 8.6 MG TABS Take 2 tablets  (17.2 mg total) by mouth at bedtime as needed (for constipation). 01/19/13  Yes Adeline Saralyn Pilar, MD  sertraline (ZOLOFT) 25 MG tablet Take 2 tablets (50 mg total) by mouth daily. 08/02/13  Yes Angelica Chessman, MD  traMADol (ULTRAM) 50 MG tablet Take 1 tablet (50 mg total) by mouth every 8 (eight) hours as needed. 09/19/13  Yes Theodis Blaze, MD    Allergies  Allergen Reactions  . Penicillins Rash    Physical Exam  Vitals  Blood pressure 164/86, pulse 107, temperature 100.5 F (38.1 C), temperature source Rectal, resp. rate 24, last menstrual period 05/21/2013, SpO2 100.00%.   1. General  well-nourished female lying in bed in NAD,    2.  patient is laying on the bed, moaning occasionally, not willing to answer question or communicate.  3. unable to do appropriate English to exam secondary to her not following commands.  4.  patient would not let me perform an eye exam, sister report patient is legally blind.  5. Supple Neck, No JVD, No cervical lymphadenopathy appriciated, No Carotid Bruits.  6. Symmetrical Chest wall movement, Good air movement bilaterally, CTAB.  7. RRR, No Gallops, Rubs or Murmurs, No Parasternal Heave.  8. Positive Bowel Sounds, Abdomen Soft, Non tender, No organomegaly appriciated,No rebound -guarding or rigidity.  9.  No Cyanosis, Normal Skin Turgor, No Skin Rash or Bruise. Has dry skin  10. Lower extremity has decreased circulation bilaterally but no evidence of cyanosis or ischemia.  11. No Palpable Lymph Nodes in Neck or Axillae   Data Review  CBC  Recent Labs Lab 11/18/13 2210  WBC 9.3  HGB 15.7*  HCT 46.6*  PLT 240  MCV 80.8  MCH 27.2  MCHC 33.7  RDW 12.8   ------------------------------------------------------------------------------------------------------------------  Chemistries   Recent Labs Lab 11/18/13 2210  NA 131*  K 6.0*  CL 92*  CO2 23  GLUCOSE 800*  BUN 22  CREATININE 0.65  CALCIUM 9.5  AST 32  ALT 58*    ALKPHOS 147*  BILITOT 0.3   ------------------------------------------------------------------------------------------------------------------ CrCl is unknown because both a height and weight (above a minimum accepted value) are required for this calculation. ------------------------------------------------------------------------------------------------------------------ No results found for this basename: TSH, T4TOTAL, FREET3, T3FREE, THYROIDAB,  in the last 72 hours   Coagulation profile No results found for this basename: INR, PROTIME,  in the last 168 hours ------------------------------------------------------------------------------------------------------------------- No results found for this basename: DDIMER,  in the last 72 hours -------------------------------------------------------------------------------------------------------------------  Cardiac Enzymes No results found for this basename: CK, CKMB, TROPONINI, MYOGLOBIN,  in the last 168 hours ------------------------------------------------------------------------------------------------------------------ No components found with this basename: POCBNP,    ---------------------------------------------------------------------------------------------------------------  Urinalysis    Component Value Date/Time   COLORURINE YELLOW 11/18/2013 2231   APPEARANCEUR CLEAR 11/18/2013 2231   LABSPEC 1.034* 11/18/2013 2231   PHURINE 5.0 11/18/2013 2231   GLUCOSEU >1000* 11/18/2013 2231   HGBUR NEGATIVE 11/18/2013 2231   BILIRUBINUR NEGATIVE 11/18/2013 2231   KETONESUR NEGATIVE 11/18/2013 2231   PROTEINUR NEGATIVE 11/18/2013 2231   UROBILINOGEN 0.2 11/18/2013 2231   NITRITE NEGATIVE 11/18/2013 2231   LEUKOCYTESUR NEGATIVE 11/18/2013 2231    ----------------------------------------------------------------------------------------------------------------  Imaging results:      Assessment & Plan  Principal Problem:    Hyperglycemia Active Problems:   HTN (hypertension)   Adjustment disorder with mixed anxiety and depressed mood   Diabetic retinopathy   Hyperkalemia    1. hyperglycemia. He says it due to noncompliant with medication and patient refusing to take her insulin today it due to her underlying psychiatric disease, even though patient does not have Cichon in her urine or low bicarbonate level, but her anion gap is elevated, so she will be continued on insulin drip in DKA protocol, till her finger sticks are more controlled, and she'll be kept n.p.o. as well as due to her mentation, once her anion gap closed she'll be switched to long-acting insulin and insulin sliding scale, we'll continue her on aggressive IV fluid hydration. 2. Psychiatric illness with adjustment disorder, with acute episode of psychosis and agitation: We'll continue patient on Zoloft and when necessary diazepam, would add when necessary IV Haldol, would have morning team consult psychiatry to see if she needs long acting antipsychotics. 3. Hypertension will resume patient back on propranolol, but will hold her lisinopril it due to hyperkalemia 4. Diabetic retinopathy: Sr. request ophthalmology evaluation during this hospitalization, so would have morning team consult ophthalmology. 5. Hyperkalemia: Patient will be admitted to telemetry unit, will hold lisinopril, will check EKG, but I would anticipate it would improve after appropriate hydration and being on an insulin drip, we'll recheck level in the morning   DVT Prophylaxis Heparin  SCDs  AM Labs Ordered, also please review Full Orders  Family Communication: Admission, patients condition and plan of care including tests being ordered have been discussed with the sister who indicate understanding and agree with the plan and Code Status.  Code Status full  Likely DC to  Home, but will consult social worker as patient may need home care  Condition GUARDED    Time spent in  minutes : 40 minutes    Phillips Climes M.D on 11/19/2013 at 1:43 AM  Between 7am to 7pm - Pager - [place]  [phone]

## 2013-11-19 NOTE — Progress Notes (Addendum)
OT EVALUATION 11/19/13 1500  OT Visit Information  Last OT Received On 11/19/13  Assistance Needed +2  History of Present Illness This 53 year old female was admitted for hyperglycemia.  She has uncontrolled DM and had a CBG of 800.  she also has a psych disorder, DDD and uses a w/c because of this.    Precautions  Precautions Fall  Precaution Comments w/c bound; legally blind  Restrictions  Weight Bearing Restrictions No  Home Living  Family/patient expects to be discharged to: Private residence  Living Arrangements Other relatives  Available Help at Discharge Available 24 hours/day;Family  Type of Home Apartment  Home Access Level entry  [place] - 2 wheels;BSC;Wheelchair - manual  Additional Comments sister present for evaluation and interpreted  Prior Function  Level of Independence Needs assistance  ADL's / Homemaking Assistance Needed sister assists with all adls.  3 people assist her to [facility]  Communication  Communication Prefers language other than English (Arabic; pt from [place])  [place] (limited participation)  Overall Cognitive Status Difficult to assess  Upper Extremity Assessment  Upper Extremity Assessment RUE deficits/detail;LUE deficits/detail  RUE Deficits / Details tremor present at rest.  Sister reports she uses this as an assist.  Unable to test ROM as pt resisted movement  LUE Deficits / Details AAROM to 40 after several repetitions; did not grip hand  ADL  Overall ADL's  Needs assistance/impaired  General ADL Comments Pt is not participating in adls at this time.  Sister assists her with all adls at home but pt does participate.  Pt did not want to do anything else after OT looked at LUE.    Vision- History  Baseline Vision/History Legally blind (wearing sunglasses)  Bed Mobility  General bed mobility comments not tested; total A x 2 with PT earlier  OT - End of Session PAIN:  Pt indicated  no pain when asked  Activity Tolerance Patient limited by fatigue  Patient left in bed;with call bell/phone within reach  OT Assessment  OT Recommendation/Assessment Patient needs continued OT Services  OT Problem List Decreased strength;Decreased range of motion;Decreased activity tolerance;Impaired balance (sitting and/or standing);Impaired vision/perception;Decreased cognition;Impaired UE functional use  OT Therapy Diagnosis  Generalized weakness  OT Plan  OT Frequency Min 2X/week  OT Treatment/Interventions Self-care/ADL training;DME and/or AE instruction;Therapeutic exercise;Therapeutic activities;Patient/family education;Balance training  See goals in care plan section of chart  OT Recommendation  Follow Up Recommendations SNF  OT Equipment None recommended by OT  Individuals Consulted  Consulted and Agree with Results and Recommendations Family member/caregiver  Family Member Consulted sister  Acute Rehab OT Goals  Patient Stated Goal none stated  OT Goal Formulation With family  Time For Goal Achievement 12/03/13  Potential to Achieve Goals Fair  OT Time Calculation  OT Start Time 1444  OT Stop Time 1500  OT Time Calculation (min) 16 min  OT General Charges  $OT Visit 1 Procedure  OT Evaluation  $Initial OT Evaluation Tier I 1 Procedure  Written Expression  Dominant Hand Left  Lesle Chris, OTR/L [phone] 11/19/2013

## 2013-11-19 NOTE — ED Provider Notes (Signed)
CSN: [id number]     Arrival date & time 11/18/13  2201 History   First MD Initiated Contact with Patient 11/18/13 2338     Chief Complaint  Patient presents with  . Hyperglycemia     (Consider location/radiation/quality/duration/timing/severity/associated sxs/prior Treatment) The history is provided by the patient. A language interpreter was used (Her daughter acts as interpreter).   Debbie [name] is a 53 y.o. female presents for evaluation of hypoglycemia and altered mental status. Patient is distraught over the death of her husband, and her ongoing medical illness. She is bed bound, and unable to walk, secondary to back, pain and disc problems. Today, she told her daughter, that she did not want to live. EMS was called and found her hysterical and screaming. Since then she hass calmed down, but is still distraught. Her daughter, states that she did not take her medicine today, and was uncooperative when her daughter tried to get her to take them. Her CBG was elevated above 300 yesterday. She has not seen her doctor recently. There are no other known modifying factors.    Past Medical History  Diagnosis Date  . Type II or unspecified type diabetes mellitus with unspecified complication, uncontrolled     x ~ 10 years  . Hypertension     x ~ 10 years  . Hypothyroidism   . Chest pain     a. ? MI in 2010, [place]->medically managed  . Diabetic foot ulcer   . Obesity   . Cataract   . Blind    Past Surgical History  Procedure Laterality Date  . Back surgery    . Tubal ligation     Family History  Problem Relation Age of Onset  . Diabetes Father     died in his 63's?  . Diabetes Father     died in her 55's?  . Diabetes Sister     alive and well.   History  Substance Use Topics  . Smoking status: Never Smoker   . Smokeless tobacco: Not on file  . Alcohol Use: No   OB History   Grav Para Term Preterm Abortions TAB SAB Ect Mult Living                 Review of Systems   All other systems reviewed and are negative.     Allergies  Penicillins  Home Medications   Prior to Admission medications   Medication Sig Start Date End Date Taking? Authorizing Provider  acetaminophen (TYLENOL) 325 MG tablet Take 2 tablets (650 mg total) by mouth every 6 (six) hours as needed. 01/02/13  Yes Nita Sells, MD  clotrimazole (LOTRIMIN) 1 % cream Apply 1 application topically 2 (two) times daily. 06/13/13  Yes Robbie Lis, MD  diazepam (VALIUM) 2 MG tablet Take 1 tablet (2 mg total) by mouth every 8 (eight) hours as needed for anxiety. 09/19/13  Yes Theodis Blaze, MD  fluconazole (DIFLUCAN) 150 MG tablet Take 1 tablet (150 mg total) by mouth once. 04/23/13  Yes Angelica Chessman, MD  insulin aspart (NOVOLOG) 100 UNIT/ML injection Inject 30 Units into the skin 3 (three) times daily before meals. 08/19/13  Yes Angelica Chessman, MD  insulin glargine (LANTUS) 100 UNIT/ML injection Inject 0.65 mLs (65 Units total) into the skin at bedtime. 08/19/13  Yes Angelica Chessman, MD  lisinopril-hydrochlorothiazide (PRINZIDE,ZESTORETIC) 10-12.5 MG per tablet Take 1 tablet by mouth daily. 09/19/13  Yes Theodis Blaze, MD  loteprednol (LOTEMAX) 0.5 % ophthalmic suspension Place  1 drop into both eyes 2 (two) times daily.   Yes Historical Provider, MD  propranolol ER (INDERAL LA) 60 MG 24 hr capsule Take 60 mg by mouth daily. 09/19/13  Yes Theodis Blaze, MD  saxagliptin HCl (ONGLYZA) 2.5 MG TABS tablet Take 2 tablets (5 mg total) by mouth daily. 08/19/13  Yes Angelica Chessman, MD  senna (SENOKOT) 8.6 MG TABS Take 2 tablets (17.2 mg total) by mouth at bedtime as needed (for constipation). 01/19/13  Yes Adeline Saralyn Pilar, MD  sertraline (ZOLOFT) 25 MG tablet Take 2 tablets (50 mg total) by mouth daily. 08/02/13  Yes Angelica Chessman, MD  traMADol (ULTRAM) 50 MG tablet Take 1 tablet (50 mg total) by mouth every 8 (eight) hours as needed. 09/19/13  Yes Theodis Blaze, MD   BP 127/75  Pulse 103   Temp(Src) 100.5 F (38.1 C) (Rectal)  Resp 14  SpO2 96%  LMP 05/21/2013 Physical Exam  Nursing note and vitals reviewed. Constitutional: She appears well-developed.  Obese, appears older than stated age.  HENT:  Head: Normocephalic and atraumatic.  Mucous membranes are somewhat dry  Eyes: Conjunctivae and EOM are normal. Pupils are equal, round, and reactive to light. Right eye exhibits no discharge. Left eye exhibits no discharge.  Neck: Normal range of motion and phonation normal. Neck supple.  Cardiovascular: Normal rate, regular rhythm and intact distal pulses.   Pulmonary/Chest: Effort normal and breath sounds normal. She exhibits no tenderness.  Abdominal: Soft. She exhibits no distension. There is no tenderness. There is no guarding.  Musculoskeletal: Normal range of motion.  Neurological: She is alert. She exhibits abnormal muscle tone.  She will not move her legs, when asked to. She appears to have tone in both lower legs.  Skin: Skin is warm and dry.  Psychiatric:  She is anxious    ED Course  Procedures (including critical care time) Medications  dextrose 5 %-0.45 % sodium chloride infusion (not administered)  insulin regular bolus via infusion 0-10 Units (not administered)  insulin regular (NOVOLIN R,HUMULIN R) 1 Units/mL in sodium chloride 0.9 % 100 mL infusion (not administered)  dextrose 50 % solution 25 mL (not administered)  0.9 %  sodium chloride infusion (not administered)    Patient Vitals for the past 24 hrs:  BP Temp Temp src Pulse Resp SpO2  11/19/13 0000 127/75 mmHg - - 103 14 96 %  11/18/13 2300 155/112 mmHg - - 106 0 96 %  11/18/13 2215 187/103 mmHg 100.5 F (38.1 C) Rectal 122 22 98 %       Case discussed with hospitalist, he will admit the patient   CRITICAL CARE Performed by: Richarda Blade Total critical care time: 35 minutes Critical care time was exclusive of separately billable procedures and treating other patients. Critical care  was necessary to treat or prevent imminent or life-threatening deterioration. Critical care was time spent personally by me on the following activities: development of treatment plan with patient and/or surrogate as well as nursing, discussions with consultants, evaluation of patient's response to treatment, examination of patient, obtaining history from patient or surrogate, ordering and performing treatments and interventions, ordering and review of laboratory studies, ordering and review of radiographic studies, pulse oximetry and re-evaluation of patient's condition.    Labs Review Labs Reviewed  CBC - Abnormal; Notable for the following:    RBC 5.77 (*)    Hemoglobin 15.7 (*)    HCT 46.6 (*)    All other components within normal  limits  COMPREHENSIVE METABOLIC PANEL - Abnormal; Notable for the following:    Sodium 131 (*)    Potassium 6.0 (*)    Chloride 92 (*)    Glucose, Bld 800 (*)    ALT 58 (*)    Alkaline Phosphatase 147 (*)    All other components within normal limits  URINALYSIS, ROUTINE W REFLEX MICROSCOPIC - Abnormal; Notable for the following:    Specific Gravity, Urine 1.034 (*)    Glucose, UA >1000 (*)    All other components within normal limits  CBG MONITORING, ED - Abnormal; Notable for the following:    Glucose-Capillary >600 (*)    All other components within normal limits  URINE MICROSCOPIC-ADD ON    Imaging Review No results found.   EKG Interpretation None      MDM   Final diagnoses:  Hyperglycemia  Depression    Hyperglycemia with anion get equal 16. She is likely dehydrated secondary to her hyperglycemia state . Suspect medical noncompliance as major reason for elevated sugar. She has situational anxiety, and likely is depressed. She'll need to be admitted for stabilization treatment and observation  Nursing Notes Reviewed/ Care Coordinated, and agree without changes. Applicable Imaging Reviewed.  Interpretation of Laboratory Data  incorporated into ED treatment   Plan: Admit  Richarda Blade, MD 11/19/13 [phone]

## 2013-11-19 NOTE — Progress Notes (Signed)
Pt has had no urine output today. Bladder scan showed greater than 900cc in bladder. MD made aware. Ordered to place Foley. Will continue to monitor. Callie Fielding RN

## 2013-11-19 NOTE — Progress Notes (Signed)
Foley placed with 800 cc urine returned. Callie Fielding RN

## 2013-11-19 NOTE — Progress Notes (Signed)
Patient admitted earlier today. H&P reviewed.  Patient doesn't apeak English. Speaks only Arabic. Sister in room was able to interpret but to limited degree.  VSS  Patient is not agitated this morning. Lungs are CTA Heart exam: S1S2 normal regular, no s3s4 no rubs murmurs or bruits. No edema Abdomen is soft. Minimal vague tenderness. Eyes closed. No facial droop. Refuses to move extremities.  Labs reviewed. CBG's are better.  Will transition to Lantus. Encourage oral intake. Hba1c Consult psyche. Unknown underlying mental health issue Patient has limited vision at baseline per sister. She has seen Ophthalmology as OP. No changes recently. Have explained to family that there is no clear role for inpatient consultation from [place]. Is bedbound per sister and gets around in [facility]. PT/OT to try mobilize if possible.  Will follow.  Bonnielee Haff 11/19/2013 9:57 AM

## 2013-11-19 NOTE — Progress Notes (Addendum)
Inpatient Diabetes Program Recommendations  AACE/ADA: New Consensus Statement on Inpatient Glycemic Control (2013)  Target Ranges:  Prepandial:   less than 140 mg/dL      Peak postprandial:   less than 180 mg/dL (1-2 hours)      Critically ill patients:  140 - 180 mg/dL   Reason for Visit: Diabetes Consult  Diabetes history: DM2 Outpatient Diabetes medications: Lantus 65 units QHS, Novolog 30 units tidwc, Onglyza 5 mg QD Current orders for Inpatient glycemic control: Lantus 30 units QD, Novolog moderate tidwc and hs  53 y.o. female, and history of diabetes mellitus, uncontrolled, the patient was brought by her sister due to uncontrolled diabetes mellitus. Hx psychiatric disorder, today was agitated combative and refused to take her insulin dose today.upon presentation to ED patient was agitated screaming and psychotic. Pt had refused to take her insulin prior to admission. GlucoStabilizer started in ED. Was not in DKA.  Awaiting call from RN for arrival of sister for interpretation. Pt does not speak English   Inpatient Diabetes Program Recommendations Insulin - Basal: Gave Lantus 30 units 1-2 hours prior to discontinuation of GlucoStabilizer Correction (SSI): Novolog moderate tidwc and hs Insulin - Meal Coverage: Add Novolog 8 units tidwc for meal coverage insulin - Pt on 30 units tidwc at home. ? compliance? Oral Agents: Restart Onglyza at discharge HgbA1C: 14.0% - uncontrolled Diet: CHO mod med  Note: Will see pt when translator available. Thank you. Lorenda Peck, RD, LDN, CDE Inpatient Diabetes Coordinator [phone]  Addendum - Lengthy discussion regarding diabetes with pt's sister. Sister states she checks blood sugars at home, but pt refuses insulin on occasion. Yesterday she refused her insulin. Sister says pt wants to die. It sounds like entire family has diabetes. Briefly discussed goal of keeping blood sugars <180 mg/dL. Sister said pt takes Novolog 30 units tid and  Lantus 45 units QHS. Discussed cutting back on portions and decreasing simple CHO such as soda, juice, sweet tea. Sister would like more DM info. Will give Exit Care Notes and order Living Well With Diabetes book.  Sister states she has no problem reading [place]. Will certainly need care manager consult. RV

## 2013-11-20 LAB — CBC
HCT: 43.1 % (ref 36.0–46.0)
HEMOGLOBIN: 14 g/dL (ref 12.0–15.0)
MCH: 26.8 pg (ref 26.0–34.0)
MCHC: 32.5 g/dL (ref 30.0–36.0)
MCV: 82.4 fL (ref 78.0–100.0)
PLATELETS: 229 10*3/uL (ref 150–400)
RBC: 5.23 MIL/uL — AB (ref 3.87–5.11)
RDW: 13.3 % (ref 11.5–15.5)
WBC: 8.9 10*3/uL (ref 4.0–10.5)

## 2013-11-20 LAB — BASIC METABOLIC PANEL
BUN: 12 mg/dL (ref 6–23)
CO2: 27 meq/L (ref 19–32)
CREATININE: 0.54 mg/dL (ref 0.50–1.10)
Calcium: 8.7 mg/dL (ref 8.4–10.5)
Chloride: 107 mEq/L (ref 96–112)
GFR calc Af Amer: >90 mL/min (ref >90)
GLUCOSE: 189 mg/dL — AB (ref 70–99)
Potassium: 4.3 mEq/L (ref 3.7–5.3)
SODIUM: 141 meq/L (ref 137–147)

## 2013-11-20 LAB — GLUCOSE, CAPILLARY
GLUCOSE-CAPILLARY: 157 mg/dL — AB (ref 70–99)
GLUCOSE-CAPILLARY: 157 mg/dL — AB (ref 70–99)
GLUCOSE-CAPILLARY: 105 mg/dL — AB (ref 70–99)
Glucose-Capillary: 136 mg/dL — ABNORMAL HIGH (ref 70–99)

## 2013-11-20 MED ORDER — SODIUM CHLORIDE 0.9 % IV BOLUS (SEPSIS)
500.0000 mL | Freq: Once | INTRAVENOUS | Status: AC
  Administered 2013-11-21: 500 mL via INTRAVENOUS

## 2013-11-20 MED ORDER — SERTRALINE HCL 100 MG PO TABS
100.0000 mg | ORAL_TABLET | Freq: Every day | ORAL | Status: DC
  Administered 2013-11-21: 100 mg via ORAL
  Filled 2013-11-20: qty 1

## 2013-11-20 NOTE — Progress Notes (Addendum)
Patient has not been drinking very much (133ml in 8 hours), and as a result of this, the output for the past 8 hours was 150 ml. PCP was notified. Awaiting any new orders.  Patient was encouraged to drink more water.

## 2013-11-20 NOTE — Progress Notes (Signed)
[facility] PROGRESS NOTE  Debbie [name] YWV:[id number] DOB: 01-Oct-1960 DOA: 11/18/2013 PCP: Angelica Chessman, MD  Assessment/Plan: 1. Uncontrolled diabetes Mellitus: - rsume lantus and ssi.  - CBG (last 3)   Recent Labs  11/20/13 0807 11/20/13 1204 11/20/13 1619  GLUCAP 157* 157* 136*     2. Depression with psychosis: - further management as per psychiatry.   3. Hypertension.   4. Hyperkalemia: resolved.   DVT prophylaxis.   Code Status: full code Family Communication: multiple family members at bedside.  Disposition Plan: pending.    Consultants:  Psychiatry.   Procedures:  none  Antibiotics:  none  HPI/Subjective: No new complaints.  No suicidal ideation  Objective: Filed Vitals:   11/20/13 1504  BP: 118/76  Pulse: 74  Temp: 98.1 F (36.7 C)  Resp: 18    Intake/Output Summary (Last 24 hours) at 11/20/13 1839 Last data filed at 11/20/13 1818  Gross per 24 hour  Intake   2570 ml  Output   1575 ml  Net    995 ml   Filed Weights   11/19/13 0223  Weight: 104.101 kg (229 lb 8 oz)    Exam:   General:  Alert afebrile comfortable  Cardiovascular: s1s2  Respiratory: ctab  Abdomen: soft NT ND BS+  Musculoskeletal: trace pedal edema.   Data Reviewed: Basic Metabolic Panel:  Recent Labs Lab 11/18/13 2210 11/19/13 0355 11/20/13 0521  NA 131* 141 141  K 6.0* 4.2 4.3  CL 92* 105 107  CO2 23 26 27   GLUCOSE 800* 336* 189*  BUN 22 16 12   CREATININE 0.65 0.55 0.54  CALCIUM 9.5 8.8 8.7   Liver Function Tests:  Recent Labs Lab 11/18/13 2210  AST 32  ALT 58*  ALKPHOS 147*  BILITOT 0.3  PROT 7.4  ALBUMIN 3.6   No results found for this basename: LIPASE, AMYLASE,  in the last 168 hours No results found for this basename: AMMONIA,  in the last 168 hours CBC:  Recent Labs Lab 11/18/13 2210 11/19/13 0355 11/20/13 0521  WBC 9.3 10.1 8.9  HGB 15.7* 13.9 14.0  HCT 46.6* 41.8 43.1  MCV 80.8 80.5 82.4  PLT 240 214  229   Cardiac Enzymes: No results found for this basename: CKTOTAL, CKMB, CKMBINDEX, TROPONINI,  in the last 168 hours BNP (last 3 results) No results found for this basename: PROBNP,  in the last 8760 hours CBG:  Recent Labs Lab 11/19/13 1724 11/19/13 2156 11/20/13 0807 11/20/13 1204 11/20/13 1619  GLUCAP 172* 169* 157* 157* 136*    No results found for this or any previous visit (from the past 240 hour(s)).   Studies: No results found.  Scheduled Meds: . docusate sodium  100 mg Oral BID  . heparin  5,000 Units Subcutaneous 3 times per day  . insulin aspart  0-15 Units Subcutaneous TID WC  . insulin aspart  0-5 Units Subcutaneous QHS  . insulin glargine  30 Units Subcutaneous Daily  . propranolol ER  60 mg Oral Daily  . [START ON 11/21/2013] sertraline  100 mg Oral Daily  . sodium chloride  1,000 mL Intravenous Once  . sodium chloride  3 mL Intravenous Q12H   Continuous Infusions: . sodium chloride 100 mL/hr at 11/20/13 0840    Principal Problem:   Hyperglycemia Active Problems:   HTN (hypertension)   Adjustment disorder with mixed anxiety and depressed mood   Diabetic retinopathy   Hyperkalemia    Time spent: 30 min    Markale Birdsell  [facility]  [facility] Pager [phone]. If 7PM-7AM, please contact night-coverage at [email or web address], password [facility] 11/20/2013, 6:39 PM  LOS: 2 days

## 2013-11-20 NOTE — Progress Notes (Signed)
Clinical Social Work Department CLINICAL SOCIAL WORK PSYCHIATRY SERVICE LINE ASSESSMENT 11/20/2013  Patient:  Debbie [name]  Account:  [id number]  Admit Date:  11/18/2013  Clinical Social Worker:  Sindy Messing, LCSW  Date/Time:  11/20/2013 02:30 PM Referred by:  Physician  Date referred:  11/20/2013 Reason for Referral  Psychosocial assessment   Presenting Symptoms/Problems (In the person's/family's own words):   Psych consulted due to SI.   Abuse/Neglect/Trauma History (check all that apply)  Denies history   Abuse/Neglect/Trauma Comments:   Psychiatric History (check all that apply)  Outpatient treatment   Psychiatric medications:  Zoloft 50 mg   Current Mental Health Hospitalizations/Previous Mental Health History:   Patient reports she was diagnosed with depression and anxiety when after she came to the [place] Patient reports that her PCP prescribed her medication that was very effective but she refused to take her medication for 2-3 days prior to admission and started feeling depressed.   Current provider:   PCP   Place and Date:   [facility], [place]   Current Medications:   Scheduled Meds:      . docusate sodium  100 mg Oral BID  . heparin  5,000 Units Subcutaneous 3 times per day  . insulin aspart  0-15 Units Subcutaneous TID WC  . insulin aspart  0-5 Units Subcutaneous QHS  . insulin glargine  30 Units Subcutaneous Daily  . propranolol ER  60 mg Oral Daily  . sertraline  50 mg Oral Daily  . sodium chloride  1,000 mL Intravenous Once  . sodium chloride  3 mL Intravenous Q12H        Continuous Infusions:      . sodium chloride 100 mL/hr at 11/20/13 0840          PRN Meds:.acetaminophen, acetaminophen, bisacodyl, dextrose, diazepam, haloperidol lactate, ondansetron (ZOFRAN) IV, ondansetron, polyethylene glycol, senna       Previous Impatient Admission/Date/Reason:   None reported   Emotional Health / Current Symptoms    Suicide/Self Harm  Suicidal ideation (ex: "I  can't take any more,I wish I could disappear")   Suicide attempt in the past:   Patient reports that she started feeling depressed and hopeless since she did not take her medications. Patient reports that she does not have any thoughts to harm herself now.   Other harmful behavior:   None reported   Psychotic/Dissociative Symptoms  None reported   Other Psychotic/Dissociative Symptoms:    Attention/Behavioral Symptoms  Within Normal Limits   Other Attention / Behavioral Symptoms:   Patient engaged during assessment. Patient did avoid eye contact and kept her eyes closed the entire but reports she almost blind so she does not like looking at people.    Cognitive Impairment  Within Normal Limits   Other Cognitive Impairment:   Patient alert and oriented.    Mood and Adjustment  Flat    Stress, Anxiety, Trauma, Any Recent Loss/Stressor  Other - See comment   Anxiety (frequency):   N/A   Phobia (specify):   N/A   Compulsive behavior (specify):   N/A   Obsessive behavior (specify):   N/A   Other:   Patient reports more depression and hopelessness after moving to the [place]   Substance Abuse/Use  None   SBIRT completed (please refer for detailed history):  N  Self-reported substance use:   Patient denies all substance use.   Urinary Drug Screen Completed:  N Alcohol level:   N/A    Environmental/Housing/Living Arrangement  Stable housing  Who is in the home:   Sister   Emergency contact:  Amal-sister   Financial  Medicaid   Patient's Strengths and Goals (patient's own words):   Patient has supportive family.   Clinical Social Worker's Interpretive Summary:   CSW received referral in order to complete psychosocial assessment. CSW reviewed chart and met with patient and two sisters at bedside. CSW introduced myself and explained role.    Patient laying in bed with eyes closed but sister agreeable to talk with CSW. Sister lives with patient and  provides 24 hour supervision. Patient has back problems is unable to walk so is either in the bed or in a wheelchair all the time. Sister reports that patient has been doing well but did not take her pills for the past few days. Sister believes that patient is depressed and that is why she takes medication. Sister spoke about patient moving to the [place] and their father passing away. Sister reports that patient goes to her PCP who prescribes medication to help with depression and it has been effective.    Sister had to leave because her ride arrived but reports she plans for sister to return back home with her at DC. Sister reports that she is not interested in facility placement at this time and has cared for patient in this condition since she arrived.    CSW then spoke with patient after family left the room. CSW used phone interpreting service to complete assessment with patient. Patient reports that she has felt depressed but that after her PCP prescribed medication she started feeling much better. Patient admits stating that she stated she was going to hurt herself but reported she did not actually mean it and did not have a plan. Patient states she was being stubborn by not taking her medications but now sees the negative effectives of skipping doses. Patient reports that she spoke with MD and is agreeable to remain compliant. Patient denies any AH, VH or any SI/HI.    Patient spoke highly of her family and of how much support they provide for her. Patient reports she is anxious to DC home and will be compliant with her appointments and medications. Patient thanked CSW for visit and agreeable to continued support during hospital stay.    CSW will continue to follow and will assist with any recommendations provided by psych MD.   Disposition:  Recommend Psych CSW continuing to support while in hospital   [place], [place] [phone]

## 2013-11-20 NOTE — Consult Note (Signed)
Psychiatry Follow Up Consult   Assessment: AXIS I:  Major Depression, Recurrent severe AXIS II:  Deferred AXIS III:   Past Medical History  Diagnosis Date  . Type II or unspecified type diabetes mellitus with unspecified complication, uncontrolled     x ~ 10 years  . Hypertension     x ~ 10 years  . Hypothyroidism   . Chest pain     a. ? MI in 2010, [place]->medically managed  . Diabetic foot ulcer   . Obesity   . Cataract   . Blind    AXIS IV:  other psychosocial or environmental problems, problems related to social environment and problems with primary support group AXIS V:  51-60 moderate symptoms  Plan:  Medication management  Subjective:   Debbie [name] is a 53 y.o. female patient admitted with hyperglycemia.  HPI:  Patient seen chart reviewed.  Patient is 52 year old Debbie [name] female who only speaks Arabic admitted to the medical floor because of high blood sugar.  Today she was seen more calm and relaxed. Her sister was there and information was updated. Patient has no previous history of inpatient psychiatric history. Patient denies suicidal ideations and now like to continue meds. She does not want to sick again. She even wants to continue follow up with psychiatrist upon discharge. She denies any hallucination, paranoia and admitted she was angry and upset but does not want to kill herself. Family is supportive and willing to help her. There has been no agitation and aggression in past 24 hrs. She is not combative.    Past Psychiatric History: Past Medical History  Diagnosis Date  . Type II or unspecified type diabetes mellitus with unspecified complication, uncontrolled     x ~ 10 years  . Hypertension     x ~ 10 years  . Hypothyroidism   . Chest pain     a. ? MI in 2010, [place]->medically managed  . Diabetic foot ulcer   . Obesity   . Cataract   . Blind     reports that she has never smoked. She does not have any smokeless tobacco history on file. She reports  that she does not drink alcohol or use illicit drugs. Family History  Problem Relation Age of Onset  . Diabetes Father     died in his 32's?  . Diabetes Father     died in her 82's?  . Diabetes Sister     alive and well.     Living Arrangements: Other relatives   Abuse/Neglect [facility]) Physical Abuse: Denies Verbal Abuse: Denies Sexual Abuse: Denies Allergies:   Allergies  Allergen Reactions  . Penicillins Rash   She has never married and she has no children.  Objective: Blood pressure 118/76, pulse 74, temperature 98.1 F (36.7 C), temperature source Oral, resp. rate 18, height 5' 2"  (1.575 m), weight 229 lb 8 oz (104.101 kg), last menstrual period 05/21/2013, SpO2 98.00%.Body mass index is 41.97 kg/(m^2). Results for orders placed during the hospital encounter of 11/18/13 (from the past 72 hour(s))  CBC     Status: Abnormal   Collection Time    11/18/13 10:10 PM      Result Value Ref Range   WBC 9.3  4.0 - 10.5 K/uL   RBC 5.77 (*) 3.87 - 5.11 MIL/uL   Hemoglobin 15.7 (*) 12.0 - 15.0 g/dL   HCT 46.6 (*) 36.0 - 46.0 %   MCV 80.8  78.0 - 100.0 fL   MCH 27.2  26.0 -  34.0 pg   MCHC 33.7  30.0 - 36.0 g/dL   RDW 12.8  11.5 - 15.5 %   Platelets 240  150 - 400 K/uL  COMPREHENSIVE METABOLIC PANEL     Status: Abnormal   Collection Time    11/18/13 10:10 PM      Result Value Ref Range   Sodium 131 (*) 137 - 147 mEq/L   Potassium 6.0 (*) 3.7 - 5.3 mEq/L   Chloride 92 (*) 96 - 112 mEq/L   CO2 23  19 - 32 mEq/L   Glucose, Bld 800 (*) 70 - 99 mg/dL   Comment: CRITICAL RESULT CALLED TO, READ BACK BY AND VERIFIED WITH:     SPOKE WITH JOHNSON,J RN [phone] COVINGTON,N   BUN 22  6 - 23 mg/dL   Creatinine, Ser 0.65  0.50 - 1.10 mg/dL   Calcium 9.5  8.4 - 10.5 mg/dL   Total Protein 7.4  6.0 - 8.3 g/dL   Albumin 3.6  3.5 - 5.2 g/dL   AST 32  0 - 37 U/L   ALT 58 (*) 0 - 35 U/L   Alkaline Phosphatase 147 (*) 39 - 117 U/L   Total Bilirubin 0.3  0.3 - 1.2 mg/dL   GFR calc non Af  Amer >90  >90 mL/min   GFR calc Af Amer >90  >90 mL/min   Comment: (NOTE)     The eGFR has been calculated using the CKD EPI equation.     This calculation has not been validated in all clinical situations.     eGFR's persistently <90 mL/min signify possible Chronic Kidney     Disease.  URINALYSIS, ROUTINE W REFLEX MICROSCOPIC     Status: Abnormal   Collection Time    11/18/13 10:31 PM      Result Value Ref Range   Color, Urine YELLOW  YELLOW   APPearance CLEAR  CLEAR   Specific Gravity, Urine 1.034 (*) 1.005 - 1.030   pH 5.0  5.0 - 8.0   Glucose, UA >1000 (*) NEGATIVE mg/dL   Hgb urine dipstick NEGATIVE  NEGATIVE   Bilirubin Urine NEGATIVE  NEGATIVE   Ketones, ur NEGATIVE  NEGATIVE mg/dL   Protein, ur NEGATIVE  NEGATIVE mg/dL   Urobilinogen, UA 0.2  0.0 - 1.0 mg/dL   Nitrite NEGATIVE  NEGATIVE   Leukocytes, UA NEGATIVE  NEGATIVE  URINE MICROSCOPIC-ADD ON     Status: None   Collection Time    11/18/13 10:31 PM      Result Value Ref Range   WBC, UA 0-2  <3 WBC/hpf   RBC / HPF 0-2  <3 RBC/hpf  CBG MONITORING, ED     Status: Abnormal   Collection Time    11/18/13 10:47 PM      Result Value Ref Range   Glucose-Capillary >600 (*) 70 - 99 mg/dL  CBG MONITORING, ED     Status: Abnormal   Collection Time    11/19/13 12:39 AM      Result Value Ref Range   Glucose-Capillary 543 (*) 70 - 99 mg/dL   Comment 1 Documented in Chart     Comment 2 Notify RN    GLUCOSE, CAPILLARY     Status: Abnormal   Collection Time    11/19/13  2:39 AM      Result Value Ref Range   Glucose-Capillary 358 (*) 70 - 99 mg/dL  GLUCOSE, CAPILLARY     Status: Abnormal   Collection Time  11/19/13  3:45 AM      Result Value Ref Range   Glucose-Capillary 301 (*) 70 - 99 mg/dL  BASIC METABOLIC PANEL     Status: Abnormal   Collection Time    11/19/13  3:55 AM      Result Value Ref Range   Sodium 141  137 - 147 mEq/L   Comment: DELTA CHECK NOTED     REPEATED TO VERIFY   Potassium 4.2  3.7 - 5.3 mEq/L    Comment: DELTA CHECK NOTED     REPEATED TO VERIFY   Chloride 105  96 - 112 mEq/L   Comment: DELTA CHECK NOTED     REPEATED TO VERIFY   CO2 26  19 - 32 mEq/L   Glucose, Bld 336 (*) 70 - 99 mg/dL   BUN 16  6 - 23 mg/dL   Creatinine, Ser 0.55  0.50 - 1.10 mg/dL   Calcium 8.8  8.4 - 10.5 mg/dL   GFR calc non Af Amer >90  >90 mL/min   GFR calc Af Amer >90  >90 mL/min   Comment: (NOTE)     The eGFR has been calculated using the CKD EPI equation.     This calculation has not been validated in all clinical situations.     eGFR's persistently <90 mL/min signify possible Chronic Kidney     Disease.  CBC     Status: Abnormal   Collection Time    11/19/13  3:55 AM      Result Value Ref Range   WBC 10.1  4.0 - 10.5 K/uL   RBC 5.19 (*) 3.87 - 5.11 MIL/uL   Hemoglobin 13.9  12.0 - 15.0 g/dL   HCT 41.8  36.0 - 46.0 %   MCV 80.5  78.0 - 100.0 fL   MCH 26.8  26.0 - 34.0 pg   MCHC 33.3  30.0 - 36.0 g/dL   RDW 13.0  11.5 - 15.5 %   Platelets 214  150 - 400 K/uL  GLUCOSE, CAPILLARY     Status: Abnormal   Collection Time    11/19/13  5:20 AM      Result Value Ref Range   Glucose-Capillary 229 (*) 70 - 99 mg/dL  GLUCOSE, CAPILLARY     Status: Abnormal   Collection Time    11/19/13  6:24 AM      Result Value Ref Range   Glucose-Capillary 189 (*) 70 - 99 mg/dL  GLUCOSE, CAPILLARY     Status: Abnormal   Collection Time    11/19/13  7:31 AM      Result Value Ref Range   Glucose-Capillary 169 (*) 70 - 99 mg/dL  GLUCOSE, CAPILLARY     Status: Abnormal   Collection Time    11/19/13  8:31 AM      Result Value Ref Range   Glucose-Capillary 143 (*) 70 - 99 mg/dL  GLUCOSE, CAPILLARY     Status: Abnormal   Collection Time    11/19/13  9:30 AM      Result Value Ref Range   Glucose-Capillary 179 (*) 70 - 99 mg/dL  GLUCOSE, CAPILLARY     Status: Abnormal   Collection Time    11/19/13 10:42 AM      Result Value Ref Range   Glucose-Capillary 148 (*) 70 - 99 mg/dL  HEMOGLOBIN A1C     Status:  Abnormal   Collection Time    11/19/13 11:11 AM      Result Value Ref  Range   Hemoglobin A1C 15.1 (*) <5.7 %   Comment: (NOTE)                                                                               According to the ADA Clinical Practice Recommendations for 2011, when     HbA1c is used as a screening test:      >=6.5%   Diagnostic of Diabetes Mellitus               (if abnormal result is confirmed)     5.7-6.4%   Increased risk of developing Diabetes Mellitus     References:Diagnosis and Classification of Diabetes Mellitus,Diabetes     MKLK,9179,15(AVWPV 1):S62-S69 and Standards of Medical Care in             Diabetes - 2011,Diabetes XYIA,1655,37 (Suppl 1):S11-S61.   Mean Plasma Glucose 387 (*) <117 mg/dL   Comment: Performed at [place], CAPILLARY     Status: Abnormal   Collection Time    11/19/13 11:51 AM      Result Value Ref Range   Glucose-Capillary 129 (*) 70 - 99 mg/dL   Comment 1 Notify RN    GLUCOSE, CAPILLARY     Status: Abnormal   Collection Time    11/19/13  5:24 PM      Result Value Ref Range   Glucose-Capillary 172 (*) 70 - 99 mg/dL  GLUCOSE, CAPILLARY     Status: Abnormal   Collection Time    11/19/13  9:56 PM      Result Value Ref Range   Glucose-Capillary 169 (*) 70 - 99 mg/dL   Comment 1 Notify RN     Comment 2 Documented in Chart    CBC     Status: Abnormal   Collection Time    11/20/13  5:21 AM      Result Value Ref Range   WBC 8.9  4.0 - 10.5 K/uL   RBC 5.23 (*) 3.87 - 5.11 MIL/uL   Hemoglobin 14.0  12.0 - 15.0 g/dL   HCT 43.1  36.0 - 46.0 %   MCV 82.4  78.0 - 100.0 fL   MCH 26.8  26.0 - 34.0 pg   MCHC 32.5  30.0 - 36.0 g/dL   RDW 13.3  11.5 - 15.5 %   Platelets 229  150 - 400 K/uL  BASIC METABOLIC PANEL     Status: Abnormal   Collection Time    11/20/13  5:21 AM      Result Value Ref Range   Sodium 141  137 - 147 mEq/L   Potassium 4.3  3.7 - 5.3 mEq/L   Chloride 107  96 - 112 mEq/L   CO2 27  19 - 32 mEq/L    Glucose, Bld 189 (*) 70 - 99 mg/dL   BUN 12  6 - 23 mg/dL   Creatinine, Ser 0.54  0.50 - 1.10 mg/dL   Calcium 8.7  8.4 - 10.5 mg/dL   GFR calc non Af Amer >90  >90 mL/min   GFR calc Af Amer >90  >90 mL/min   Comment: (NOTE)     The eGFR has been calculated using  the CKD EPI equation.     This calculation has not been validated in all clinical situations.     eGFR's persistently <90 mL/min signify possible Chronic Kidney     Disease.  GLUCOSE, CAPILLARY     Status: Abnormal   Collection Time    11/20/13  8:07 AM      Result Value Ref Range   Glucose-Capillary 157 (*) 70 - 99 mg/dL  GLUCOSE, CAPILLARY     Status: Abnormal   Collection Time    11/20/13 12:04 PM      Result Value Ref Range   Glucose-Capillary 157 (*) 70 - 99 mg/dL   Labs are reviewed.  Current Facility-Administered Medications  Medication Dose Route Frequency Provider Last Rate Last Dose  . 0.9 %  sodium chloride infusion   Intravenous Continuous Bonnielee Haff, MD 100 mL/hr at 11/20/13 0840    . acetaminophen (TYLENOL) tablet 650 mg  650 mg Oral Q6H PRN Phillips Climes, MD       Or  . acetaminophen (TYLENOL) suppository 650 mg  650 mg Rectal Q6H PRN Phillips Climes, MD      . bisacodyl (DULCOLAX) suppository 10 mg  10 mg Rectal Daily PRN Phillips Climes, MD      . dextrose 50 % solution 25 mL  25 mL Intravenous PRN Richarda Blade, MD      . diazepam (VALIUM) tablet 2 mg  2 mg Oral Q8H PRN Phillips Climes, MD      . docusate sodium (COLACE) capsule 100 mg  100 mg Oral BID Phillips Climes, MD   100 mg at 11/19/13 2211  . haloperidol lactate (HALDOL) injection 1 mg  1 mg Intravenous Q4H PRN Phillips Climes, MD   1 mg at 11/19/13 1739  . heparin injection 5,000 Units  5,000 Units Subcutaneous 3 times per day Phillips Climes, MD   5,000 Units at 11/20/13 1328  . insulin aspart (novoLOG) injection 0-15 Units  0-15 Units Subcutaneous TID WC Bonnielee Haff, MD   3 Units at 11/20/13 1220  . insulin aspart (novoLOG)  injection 0-5 Units  0-5 Units Subcutaneous QHS Bonnielee Haff, MD      . insulin glargine (LANTUS) injection 30 Units  30 Units Subcutaneous Daily Bonnielee Haff, MD   30 Units at 11/20/13 0959  . ondansetron (ZOFRAN) tablet 4 mg  4 mg Oral Q6H PRN Phillips Climes, MD       Or  . ondansetron (ZOFRAN) injection 4 mg  4 mg Intravenous Q6H PRN Dawood Elgergawy, MD      . polyethylene glycol (MIRALAX / GLYCOLAX) packet 17 g  17 g Oral Daily PRN Phillips Climes, MD      . propranolol ER (INDERAL LA) 24 hr capsule 60 mg  60 mg Oral Daily Phillips Climes, MD   60 mg at 11/20/13 0959  . senna (SENOKOT) tablet 17.2 mg  2 tablet Oral QHS PRN Phillips Climes, MD      . sertraline (ZOLOFT) tablet 50 mg  50 mg Oral Daily Phillips Climes, MD   50 mg at 11/20/13 0959  . sodium chloride 0.9 % bolus 1,000 mL  1,000 mL Intravenous Once Phillips Climes, MD      . sodium chloride 0.9 % injection 3 mL  3 mL Intravenous Q12H Phillips Climes, MD   3 mL at 11/19/13 2212    Psychiatric Specialty Exam:     Blood pressure 118/76, pulse 74, temperature 98.1 F (36.7 C), temperature source Oral, resp. rate 18, height  5' 2"  (1.575 m), weight 229 lb 8 oz (104.101 kg), last menstrual period 05/21/2013, SpO2 98.00%.Body mass index is 41.97 kg/(m^2).  General Appearance: Guarded  Eye Contact::  Fair  Speech:  Slow  Volume:  Decreased  Mood:  Dysphoric  Affect:  Constricted  Thought Process:  Intact  Orientation:  Full (Time, Place, and Person)  Thought Content:  Rumination  Suicidal Thoughts:  No  Homicidal Thoughts:  No  Memory:  fair  Judgement:  Fair  Insight:  Fair and Lacking  Psychomotor Activity:  Decreased  Concentration:  Fair  Recall:  Poor  Fund of Knowledge:Fair  Language: Patient speaks Arabic  Akathisia:  No  Handed:  Right  AIMS (if indicated):     Assets:  Housing  Sleep:      Musculoskeletal: Strength & Muscle Tone: Unable to assess Gait & Station: Unable to assess Patient leans:  N/A  Treatment Plan Summary: Medication management, Continue Zoloft 100 mg daily if not medically contraindicated.  Discontinue sitter. Patient can be follow up with out patient services upon discharge. Please call [phone] if you have any further questions.  Dossie Der T Salisha Bardsley 11/20/2013 4:10 PM

## 2013-11-21 DIAGNOSIS — F4323 Adjustment disorder with mixed anxiety and depressed mood: Secondary | ICD-10-CM

## 2013-11-21 DIAGNOSIS — R7309 Other abnormal glucose: Secondary | ICD-10-CM

## 2013-11-21 LAB — GLUCOSE, CAPILLARY: GLUCOSE-CAPILLARY: 95 mg/dL (ref 70–99)

## 2013-11-21 MED ORDER — INSULIN ASPART 100 UNIT/ML ~~LOC~~ SOLN: 0.0000 [IU] | Freq: Three times a day (TID) | SUBCUTANEOUS | Status: DC

## 2013-11-21 MED ORDER — INSULIN GLARGINE 100 UNIT/ML ~~LOC~~ SOLN: 30.0000 [IU] | Freq: Every day | SUBCUTANEOUS | Status: DC

## 2013-11-21 MED ORDER — DSS 100 MG PO CAPS: 100.0000 mg | ORAL_CAPSULE | Freq: Two times a day (BID) | ORAL | Status: DC

## 2013-11-21 NOTE — Progress Notes (Signed)
No changes noted since I arrived at 1300. No IV, tele, or foley present. Family assisted patient in getting dressed and helped her to the bathroom. They forgot to measure the amount of urine. Patient to DC home with family. DC instructions carefully reviewed with patient and family. Patient to call PCP for appointment in 1 week, followed by an eye doctor appointment later on. Patient to resume home meds. Next due time written on patient's copy of DC instructions. Education given on DM, DM foot care, DM diet, DM retinopathy. Patient and family reported understanding of instructions and verbalized understanding of how to administer insulin. Dr. Karleen Hampshire called into the room to speak with family to answer any questions that they may have. Patient taken to car via wheelchair and was assisted into the car. Seat belt applied by family.

## 2013-11-21 NOTE — Discharge Summary (Signed)
Physician Discharge Summary  [name] KXF:[id number] DOB: 1961-06-04 DOA: 11/18/2013  PCP: Angelica Chessman, MD  Admit date: 11/18/2013 Discharge date: 11/21/2013  Time spent: 30 minutes  Recommendations for Outpatient Follow-up:  1. Follow up with PCP in one week.    Discharge Diagnoses:  Principal Problem:   Hyperglycemia Active Problems:   HTN (hypertension)   Adjustment disorder with mixed anxiety and depressed mood   Diabetic retinopathy   Hyperkalemia   Discharge Condition: improved.  Diet recommendation: carb modified.  Filed Weights   11/19/13 0223  Weight: 104.101 kg (229 lb 8 oz)    History of present illness:   Debbie [name] is a 53 y.o. female, and history of diabetes mellitus, uncontrolled, the patient was brought by her sister due to uncontrolled diabetes mellitus, and fingersticks were too high to read at home, sister gives a history, reports her sister has some psychiatric disorder, today was agitated combative and refused to take care her insulin dose today, upon presentation to ED patient was agitated screaming and psychotic, patient was found to have anion gap of 16, but there was no acute tone in her urine, and had bicarbonate of 23, and her potassium was 6, patient is refusing to comply with that physical exam, her history, patient's sister was giving the history, patient blood sugar was 800, so she was started on insulin drip in ED, and hospitalist service requested to admit the patient, as well sister reports patient has been having increased episodes of psychosis and agitation, reports she is wheelchair dependent at baseline he due to severe degenerative disc disease.  psychiatry consulted and recommended no change in management . Continue with zoloft 100 mg daily. One of the concerns of the family was pt's inability to see secondary to cataracts.  Her ophthalmologist suggested that her diabetes should be well controlled before cataract furgery. On  discharge her cbg's are very well controlled. She did not have any suicidal ideations.   Hospital Course:  1. Uncontrolled diabetes Mellitus: - resume lantus and ssi.  -  CBG (last 3)   Recent Labs   11/20/13 0807  11/20/13 1204  11/20/13 1619   GLUCAP  157*  157*  136*    2. Depression with psychosis:  - continue with zoloft daily. Outpatient followup. Pt has not be suicidal or psychotic on discharge.  3. Hypertension. Controlled.  4. Hyperkalemia: resolved   Procedures:  none  Consultations:  Psychiatry.  Discharge Exam: Filed Vitals:   11/21/13 0512  BP: 109/68  Pulse: 71  Temp: 98.5 F (36.9 C)  Resp: 20    General: alert afebrile comfortable Cardiovascular: s1s2 Respiratory: ctab  Discharge Instructions You were cared for by a hospitalist during your hospital stay. If you have any questions about your discharge medications or the care you received while you were in the hospital after you are discharged, you can call the unit and asked to speak with the hospitalist on call if the hospitalist that took care of you is not available. Once you are discharged, your primary care physician will handle any further medical issues. Please note that NO REFILLS for any discharge medications will be authorized once you are discharged, as it is imperative that you return to your primary care physician (or establish a relationship with a primary care physician if you do not have one) for your aftercare needs so that they can reassess your need for medications and monitor your lab values.  Discharge Orders   Future Orders Complete By  Expires   Diet Carb Modified  As directed    Discharge instructions  As directed        Medication List    STOP taking these medications       fluconazole 150 MG tablet  Commonly known as:  DIFLUCAN      TAKE these medications       acetaminophen 325 MG tablet  Commonly known as:  TYLENOL  Take 2 tablets (650 mg total) by mouth every 6  (six) hours as needed.     clotrimazole 1 % cream  Commonly known as:  LOTRIMIN  Apply 1 application topically 2 (two) times daily.     diazepam 2 MG tablet  Commonly known as:  VALIUM  Take 1 tablet (2 mg total) by mouth every 8 (eight) hours as needed for anxiety.     DSS 100 MG Caps  Take 100 mg by mouth 2 (two) times daily.     insulin aspart 100 UNIT/ML injection  Commonly known as:  novoLOG  Inject 0-15 Units into the skin 3 (three) times daily with meals.     insulin glargine 100 UNIT/ML injection  Commonly known as:  LANTUS  Inject 0.3 mLs (30 Units total) into the skin daily.     lisinopril-hydrochlorothiazide 10-12.5 MG per tablet  Commonly known as:  PRINZIDE,ZESTORETIC  Take 1 tablet by mouth daily.     loteprednol 0.5 % ophthalmic suspension  Commonly known as:  LOTEMAX  Place 1 drop into both eyes 2 (two) times daily.     propranolol ER 60 MG 24 hr capsule  Commonly known as:  INDERAL LA  Take 60 mg by mouth daily.     saxagliptin HCl 2.5 MG Tabs tablet  Commonly known as:  ONGLYZA  Take 2 tablets (5 mg total) by mouth daily.     senna 8.6 MG Tabs tablet  Commonly known as:  SENOKOT  Take 2 tablets (17.2 mg total) by mouth at bedtime as needed (for constipation).     sertraline 25 MG tablet  Commonly known as:  ZOLOFT  Take 2 tablets (50 mg total) by mouth daily.     traMADol 50 MG tablet  Commonly known as:  ULTRAM  Take 1 tablet (50 mg total) by mouth every 8 (eight) hours as needed.       Allergies  Allergen Reactions  . Penicillins Rash       Follow-up Information   Follow up with Angelica Chessman, MD. Schedule an appointment as soon as possible for a visit in 1 week.   Specialty:  Internal Medicine   Contact information:   [place] [address] [phone]        The results of significant diagnostics from this hospitalization (including imaging, microbiology, ancillary and laboratory) are listed below for  reference.    Significant Diagnostic Studies: No results found.  Microbiology: No results found for this or any previous visit (from the past 240 hour(s)).   Labs: Basic Metabolic Panel:  Recent Labs Lab 11/18/13 2210 11/19/13 0355 11/20/13 0521  NA 131* 141 141  K 6.0* 4.2 4.3  CL 92* 105 107  CO2 23 26 27   GLUCOSE 800* 336* 189*  BUN 22 16 12   CREATININE 0.65 0.55 0.54  CALCIUM 9.5 8.8 8.7   Liver Function Tests:  Recent Labs Lab 11/18/13 2210  AST 32  ALT 58*  ALKPHOS 147*  BILITOT 0.3  PROT 7.4  ALBUMIN 3.6   No  results found for this basename: LIPASE, AMYLASE,  in the last 168 hours No results found for this basename: AMMONIA,  in the last 168 hours CBC:  Recent Labs Lab 11/18/13 2210 11/19/13 0355 11/20/13 0521  WBC 9.3 10.1 8.9  HGB 15.7* 13.9 14.0  HCT 46.6* 41.8 43.1  MCV 80.8 80.5 82.4  PLT 240 214 229   Cardiac Enzymes: No results found for this basename: CKTOTAL, CKMB, CKMBINDEX, TROPONINI,  in the last 168 hours BNP: BNP (last 3 results) No results found for this basename: PROBNP,  in the last 8760 hours CBG:  Recent Labs Lab 11/20/13 0807 11/20/13 1204 11/20/13 1619 11/20/13 2323 11/21/13 0738  GLUCAP 157* 157* 136* 105* 95       Signed:  Hosie Poisson  [facility] 11/21/2013, 10:58 AM

## 2013-11-21 NOTE — Progress Notes (Signed)
Family arrived to pick up patient. Dr. Karleen Hampshire notified to update family.

## 2013-11-27 LAB — GLUCOSE, CAPILLARY: Glucose-Capillary: 110 mg/dL — ABNORMAL HIGH (ref 70–99)

## 2013-12-06 ENCOUNTER — Encounter: Payer: Self-pay | Admitting: Internal Medicine

## 2013-12-06 ENCOUNTER — Other Ambulatory Visit: Payer: Self-pay | Admitting: Emergency Medicine

## 2013-12-06 ENCOUNTER — Ambulatory Visit: Payer: Medicaid Other | Attending: Internal Medicine | Admitting: Internal Medicine

## 2013-12-06 VITALS — BP 144/80 | HR 88 | Temp 98.2°F | Resp 16

## 2013-12-06 DIAGNOSIS — I252 Old myocardial infarction: Secondary | ICD-10-CM | POA: Insufficient documentation

## 2013-12-06 DIAGNOSIS — Z79899 Other long term (current) drug therapy: Secondary | ICD-10-CM | POA: Diagnosis not present

## 2013-12-06 DIAGNOSIS — Z794 Long term (current) use of insulin: Secondary | ICD-10-CM | POA: Insufficient documentation

## 2013-12-06 DIAGNOSIS — E119 Type 2 diabetes mellitus without complications: Secondary | ICD-10-CM

## 2013-12-06 DIAGNOSIS — R259 Unspecified abnormal involuntary movements: Secondary | ICD-10-CM | POA: Insufficient documentation

## 2013-12-06 DIAGNOSIS — E669 Obesity, unspecified: Secondary | ICD-10-CM | POA: Diagnosis not present

## 2013-12-06 DIAGNOSIS — R309 Painful micturition, unspecified: Secondary | ICD-10-CM

## 2013-12-06 DIAGNOSIS — R300 Dysuria: Secondary | ICD-10-CM | POA: Diagnosis present

## 2013-12-06 DIAGNOSIS — I10 Essential (primary) hypertension: Secondary | ICD-10-CM | POA: Diagnosis not present

## 2013-12-06 DIAGNOSIS — Z993 Dependence on wheelchair: Secondary | ICD-10-CM | POA: Insufficient documentation

## 2013-12-06 LAB — GLUCOSE, POCT (MANUAL RESULT ENTRY): POC Glucose: 277 mg/dl — AB (ref 70–99)

## 2013-12-06 MED ORDER — INSULIN ASPART 100 UNIT/ML ~~LOC~~ SOLN: 10.0000 [IU] | Freq: Once | SUBCUTANEOUS | Status: DC

## 2013-12-06 NOTE — Progress Notes (Signed)
Pt is here having abdomen pain. She reports that she feels constantly cold. She is also is very sensitive and painful to the touch. Pt is having little urination.

## 2013-12-06 NOTE — Progress Notes (Signed)
Pt is being transferred to [facility] ER via private vehicle for further evaluation and catherization for anuria.  Report given to triage nurse [facility]

## 2013-12-06 NOTE — Progress Notes (Signed)
Patient ID: Debbie [name], female   DOB: 23-Mar-1961, 53 y.o.   MRN: [id number]  CC: painful urination   HPI: Patient presents to clinic today for evaluation of painful urination.  Patient reports that she was catheterized on 5/11 when she was hospitalized for hyperglycemia.  Patient's family is present to interpret.  Family states that for 2 weeks the patient has had pain in her lower abdomen, chills, and small amounts of urine production.  Patient's family states that the patient has not urinated since early this morning.  Patient refused to try to void during office visit and states she cannot. Patient is wheelchair bound and has tremors.  Family states that patient gets very aggressive and attempts to hit staff when touched or angry and may need sedation in order to catheterize.   Allergies  Allergen Reactions  . Penicillins Rash   Past Medical History  Diagnosis Date  . Type II or unspecified type diabetes mellitus with unspecified complication, uncontrolled     x ~ 10 years  . Hypertension     x ~ 10 years  . Hypothyroidism   . Chest pain     a. ? MI in 2010, [place]->medically managed  . Diabetic foot ulcer   . Obesity   . Cataract   . Blind    Current Outpatient Prescriptions on File Prior to Visit  Medication Sig Dispense Refill  . acetaminophen (TYLENOL) 325 MG tablet Take 2 tablets (650 mg total) by mouth every 6 (six) hours as needed.      . clotrimazole (LOTRIMIN) 1 % cream Apply 1 application topically 2 (two) times daily.  30 g  3  . diazepam (VALIUM) 2 MG tablet Take 1 tablet (2 mg total) by mouth every 8 (eight) hours as needed for anxiety.  15 tablet  0  . docusate sodium 100 MG CAPS Take 100 mg by mouth 2 (two) times daily.  10 capsule  0  . insulin aspart (NOVOLOG) 100 UNIT/ML injection Inject 0-15 Units into the skin 3 (three) times daily with meals.  10 mL  11  . insulin glargine (LANTUS) 100 UNIT/ML injection Inject 0.3 mLs (30 Units total) into the skin daily.  10  mL  1  . lisinopril-hydrochlorothiazide (PRINZIDE,ZESTORETIC) 10-12.5 MG per tablet Take 1 tablet by mouth daily.  90 tablet  3  . loteprednol (LOTEMAX) 0.5 % ophthalmic suspension Place 1 drop into both eyes 2 (two) times daily.      . propranolol ER (INDERAL LA) 60 MG 24 hr capsule Take 60 mg by mouth daily.      . saxagliptin HCl (ONGLYZA) 2.5 MG TABS tablet Take 2 tablets (5 mg total) by mouth daily.  180 tablet  3  . senna (SENOKOT) 8.6 MG TABS Take 2 tablets (17.2 mg total) by mouth at bedtime as needed (for constipation).  30 each  0  . sertraline (ZOLOFT) 25 MG tablet Take 2 tablets (50 mg total) by mouth daily.  90 tablet  3  . traMADol (ULTRAM) 50 MG tablet Take 1 tablet (50 mg total) by mouth every 8 (eight) hours as needed.  60 tablet  0   No current facility-administered medications on file prior to visit.   Family History  Problem Relation Age of Onset  . Diabetes Father     died in his 92's?  . Diabetes Father     died in her 79's?  . Diabetes Sister     alive and well.   History  Social History  . Marital Status: Single    Spouse Name: N/A    Number of Children: N/A  . Years of Education: N/A   Occupational History  . Not on file.   Social History Main Topics  . Smoking status: Never Smoker   . Smokeless tobacco: Not on file  . Alcohol Use: No  . Drug Use: No  . Sexual Activity: No   Other Topics Concern  . Not on file   Social History Narrative   From [place], [place].  Moved to [place] to live with sister in April of 2014.  She does not routinely exercise.   Review of Systems  Constitutional: Positive for fever and chills.       Loss of appetite   Gastrointestinal: Positive for abdominal pain.  Genitourinary: Positive for dysuria and flank pain. Negative for urgency, frequency and hematuria.  Musculoskeletal:       Generalized pain to touch  Neurological: Positive for tremors.     Objective:   Filed Vitals:   12/06/13 1641  BP: 144/80  Pulse: 88   Temp: 98.2 F (36.8 C)  Resp: 16   Physical Exam  Constitutional: She appears well-nourished.  Cardiovascular: Normal rate, regular rhythm and normal heart sounds.   Pulmonary/Chest: Effort normal and breath sounds normal.  Abdominal: Soft. Bowel sounds are normal. There is tenderness.  Psychiatric: She has a normal mood and affect.     Lab Results  Component Value Date   WBC 8.9 11/20/2013   HGB 14.0 11/20/2013   HCT 43.1 11/20/2013   MCV 82.4 11/20/2013   PLT 229 11/20/2013   Lab Results  Component Value Date   CREATININE 0.54 11/20/2013   BUN 12 11/20/2013   NA 141 11/20/2013   K 4.3 11/20/2013   CL 107 11/20/2013   CO2 27 11/20/2013    Lab Results  Component Value Date   HGBA1C 15.1* 11/19/2013   Lipid Panel     Component Value Date/Time   CHOL 200 09/19/2013 1617   TRIG 146 09/19/2013 1617   HDL 56 09/19/2013 1617   CHOLHDL 3.6 09/19/2013 1617   VLDL 29 09/19/2013 1617   LDLCALC 115* 09/19/2013 1617       Assessment and plan:   Debbie [name] was seen today for follow-up.  Diagnoses and associated orders for this visit:  Painful urination Sending patient to ED for urinary analysis.  Patient is aggressive when touched, will need sedation in order to obtain urine sample. Highly suspicious of UTI. Patient family reports they will take her to [facility] ED now for evaluation. Report has been called to ED charge nurse. Explained the importance of treatment and how patient can potentially become septic if not treated.  Diabetes - Glucose (CBG) - insulin aspart (novoLOG) injection 10 Units; Inject 0.1 mLs (10 Units total) into the skin once.----Not given, transfer to ER.  Return in about 2 weeks (around 12/20/2013) for with Debbie [name].       Lance Bosch, [facility] [phone] 12/06/2013, 5:13 PM

## 2013-12-06 NOTE — Patient Instructions (Signed)
Go to [facility] ED for evaluation

## 2014-01-01 ENCOUNTER — Telehealth: Payer: Self-pay | Admitting: Internal Medicine

## 2014-01-01 NOTE — Telephone Encounter (Signed)
Pt. Is requesting letter from provider stating that she is "incapable of handling her own affairs", this is letter is needed to complete her disability application.Marland KitchenMarland KitchenMarland KitchenPlease mail to [employer].Marland KitchenMarland Kitchen

## 2014-01-01 NOTE — Telephone Encounter (Signed)
Will give information to Dr. Doreene Burke for completion.  Please provide letter for pt stating she is incapable of handling her own affairs.

## 2014-01-09 ENCOUNTER — Encounter: Payer: Self-pay | Admitting: Internal Medicine

## 2014-01-09 ENCOUNTER — Ambulatory Visit: Payer: Medicaid Other | Attending: Internal Medicine | Admitting: Internal Medicine

## 2014-01-09 VITALS — BP 135/82 | HR 105 | Temp 98.0°F | Resp 14

## 2014-01-09 DIAGNOSIS — Z91199 Patient's noncompliance with other medical treatment and regimen due to unspecified reason: Secondary | ICD-10-CM | POA: Insufficient documentation

## 2014-01-09 DIAGNOSIS — I10 Essential (primary) hypertension: Secondary | ICD-10-CM | POA: Insufficient documentation

## 2014-01-09 DIAGNOSIS — E119 Type 2 diabetes mellitus without complications: Secondary | ICD-10-CM | POA: Diagnosis present

## 2014-01-09 DIAGNOSIS — Z794 Long term (current) use of insulin: Secondary | ICD-10-CM | POA: Insufficient documentation

## 2014-01-09 DIAGNOSIS — F411 Generalized anxiety disorder: Secondary | ICD-10-CM | POA: Insufficient documentation

## 2014-01-09 DIAGNOSIS — E1310 Other specified diabetes mellitus with ketoacidosis without coma: Secondary | ICD-10-CM | POA: Diagnosis not present

## 2014-01-09 DIAGNOSIS — Z9119 Patient's noncompliance with other medical treatment and regimen: Secondary | ICD-10-CM | POA: Insufficient documentation

## 2014-01-09 DIAGNOSIS — B369 Superficial mycosis, unspecified: Secondary | ICD-10-CM | POA: Diagnosis not present

## 2014-01-09 DIAGNOSIS — G8929 Other chronic pain: Secondary | ICD-10-CM | POA: Diagnosis not present

## 2014-01-09 DIAGNOSIS — F4310 Post-traumatic stress disorder, unspecified: Secondary | ICD-10-CM | POA: Insufficient documentation

## 2014-01-09 DIAGNOSIS — E1110 Type 2 diabetes mellitus with ketoacidosis without coma: Secondary | ICD-10-CM | POA: Insufficient documentation

## 2014-01-09 DIAGNOSIS — G894 Chronic pain syndrome: Secondary | ICD-10-CM

## 2014-01-09 LAB — GLUCOSE, POCT (MANUAL RESULT ENTRY): POC Glucose: 338 mg/dl — AB (ref 70–99)

## 2014-01-09 MED ORDER — PROPRANOLOL HCL ER 60 MG PO CP24: 60.0000 mg | ORAL_CAPSULE | Freq: Every day | ORAL | Status: DC

## 2014-01-09 MED ORDER — TRAMADOL HCL 50 MG PO TABS: 50.0000 mg | ORAL_TABLET | Freq: Three times a day (TID) | ORAL | Status: DC | PRN

## 2014-01-09 MED ORDER — DAPAGLIFLOZIN PROPANEDIOL 10 MG PO TABS: 10.0000 mg | ORAL_TABLET | Freq: Every day | ORAL | Status: DC

## 2014-01-09 MED ORDER — SERTRALINE HCL 50 MG PO TABS: 50.0000 mg | ORAL_TABLET | Freq: Every day | ORAL | Status: DC

## 2014-01-09 MED ORDER — DIAZEPAM 2 MG PO TABS: 2.0000 mg | ORAL_TABLET | Freq: Three times a day (TID) | ORAL | Status: DC | PRN

## 2014-01-09 MED ORDER — CLOTRIMAZOLE 1 % EX CREA: 1.0000 "application " | TOPICAL_CREAM | Freq: Two times a day (BID) | CUTANEOUS | Status: DC

## 2014-01-09 MED ORDER — LOTEPREDNOL ETABONATE 0.5 % OP SUSP: 1.0000 [drp] | Freq: Two times a day (BID) | OPHTHALMIC | Status: AC

## 2014-01-09 NOTE — Progress Notes (Signed)
Patient ID: Debbie [name], female   DOB: 10-15-1960, 53 y.o.   MRN: [id number]   Debbie [name], is a 53 y.o. female  KXF:[id number]  [id number]  DOB - [birth year]  Chief Complaint  Patient presents with  . Follow-up        Subjective:   Debbie [name] is a 53 y.o. female here today for a follow up visit. Patient is known to have diabetes mellitus uncontrolled because of medication noncompliance, with posttraumatic stress disorder, essential hypertension relatively controlled recently admitted to the hospital for DKA, here today for followup and for medication refills. She has no significant complaint other than ongoing blindness due to cataracts that required surgery yet to be done, major depression, she still wheelchair. She is in the clinic today with her sister and an Arabic interpreter. Her blood sugar has been uncontrolled despite insulin Lantus 30 units each bedtime, NovoLog scale and Onglyza 5 mg tablet daily. Administration of medications have been erratic because sometimes patient refuses to take them. She has no injury or wound, no decubitus ulcer, no headache, no abdominal pain, no change in bowel habit. Patient has No headache, No chest pain, No abdominal pain - No Nausea, No new weakness tingling or numbness, No Cough - SOB.  Problem  DM (Diabetes Mellitus) Type 2, Uncontrolled, With Ketoacidosis  Ptsd (Post-Traumatic Stress Disorder)  Fungal Dermatitis  Essential Hypertension    ALLERGIES: Allergies  Allergen Reactions  . Penicillins Rash    PAST MEDICAL HISTORY: Past Medical History  Diagnosis Date  . Type II or unspecified type diabetes mellitus with unspecified complication, uncontrolled     x ~ 10 years  . Hypertension     x ~ 10 years  . Hypothyroidism   . Chest pain     a. ? MI in 2010, [place]->medically managed  . Diabetic foot ulcer   . Obesity   . Cataract   . Blind     MEDICATIONS AT HOME: Prior to Admission medications   Medication Sig Start  Date End Date Taking? Authorizing Provider  clotrimazole (LOTRIMIN) 1 % cream Apply 1 application topically 2 (two) times daily. 01/09/14  Yes Angelica Chessman, MD  diazepam (VALIUM) 2 MG tablet Take 1 tablet (2 mg total) by mouth every 8 (eight) hours as needed for anxiety. 01/09/14  Yes Angelica Chessman, MD  insulin aspart (NOVOLOG) 100 UNIT/ML injection Inject 0-15 Units into the skin 3 (three) times daily with meals. 11/21/13  Yes Hosie Poisson, MD  insulin glargine (LANTUS) 100 UNIT/ML injection Inject 0.3 mLs (30 Units total) into the skin daily. 11/21/13  Yes Hosie Poisson, MD  lisinopril-hydrochlorothiazide (PRINZIDE,ZESTORETIC) 10-12.5 MG per tablet Take 1 tablet by mouth daily. 09/19/13  Yes Theodis Blaze, MD  propranolol ER (INDERAL LA) 60 MG 24 hr capsule Take 1 capsule (60 mg total) by mouth daily. 01/09/14  Yes Angelica Chessman, MD  sertraline (ZOLOFT) 50 MG tablet Take 1 tablet (50 mg total) by mouth daily. 01/09/14  Yes Angelica Chessman, MD  traMADol (ULTRAM) 50 MG tablet Take 1 tablet (50 mg total) by mouth every 8 (eight) hours as needed. 01/09/14  Yes Angelica Chessman, MD  acetaminophen (TYLENOL) 325 MG tablet Take 2 tablets (650 mg total) by mouth every 6 (six) hours as needed. 01/02/13   Nita Sells, MD  Dapagliflozin Propanediol (FARXIGA) 10 MG TABS Take 10 mg by mouth daily. 01/09/14   Angelica Chessman, MD  docusate sodium 100 MG CAPS Take 100 mg by mouth 2 (two) times daily. 11/21/13  Hosie Poisson, MD  loteprednol (LOTEMAX) 0.5 % ophthalmic suspension Place 1 drop into both eyes 2 (two) times daily. 01/09/14   Angelica Chessman, MD  saxagliptin HCl (ONGLYZA) 2.5 MG TABS tablet Take 2 tablets (5 mg total) by mouth daily. 08/19/13   Angelica Chessman, MD  senna (SENOKOT) 8.6 MG TABS Take 2 tablets (17.2 mg total) by mouth at bedtime as needed (for constipation). 01/19/13   Sheila Oats, MD     Objective:   Filed Vitals:   01/09/14 0906  BP: 135/82  Pulse: 105  Temp: 98 F  (36.7 C)  TempSrc: Oral  Resp: 14  SpO2: 95%    Exam General appearance : Awake, alert, not in any distress. Speech Clear. Not toxic looking, wheelchair bound HEENT: Atraumatic and Normocephalic, eyes were covered with dark glasses  Neck: supple, no JVD. No cervical lymphadenopathy.  Chest:Good air entry bilaterally, no added sounds  CVS: S1 S2 regular, no murmurs.  Abdomen: Bowel sounds present, Non tender and not distended with no gaurding, rigidity or rebound. Extremities: B/L Lower Ext shows no edema, both legs are warm to touch Neurology: Awake alert, and oriented X 3, with bilateral hand tremor at rest  Skin: Tinea corporis  Data Review Lab Results  Component Value Date   HGBA1C 15.1* 11/19/2013   HGBA1C 14.0 09/19/2013   HGBA1C 14.0 04/18/2013     Assessment & Plan   1. DM (diabetes mellitus) type 2, uncontrolled, with ketoacidosis  - Glucose (CBG) I will add - Dapagliflozin Propanediol (FARXIGA) 10 MG TABS; Take 10 mg by mouth daily.  Dispense: 30 tablet; Refill: 3 Continue current insulin regimen Continue Onglyza - Ambulatory referral to Podiatry  2. PTSD (post-traumatic stress disorder)  - diazepam (VALIUM) 2 MG tablet; Take 1 tablet (2 mg total) by mouth every 8 (eight) hours as needed for anxiety.  Dispense: 60 tablet; Refill: 0 - sertraline (ZOLOFT) 50 MG tablet; Take 1 tablet (50 mg total) by mouth daily.  Dispense: 90 tablet; Refill: 3  3. Fungal dermatitis  - loteprednol (LOTEMAX) 0.5 % ophthalmic suspension; Place 1 drop into both eyes 2 (two) times daily.  Dispense: 15 mL; Refill: 3 - clotrimazole (LOTRIMIN) 1 % cream; Apply 1 application topically 2 (two) times daily.  Dispense: 60 g; Refill: 3  4. Generalized anxiety disorder Continue Valium 2 mg tablet by mouth Q8 hour when necessary  5. Essential hypertension  - propranolol ER (INDERAL LA) 60 MG 24 hr capsule; Take 1 capsule (60 mg total) by mouth daily.  Dispense: 90 capsule; Refill: 3 Continue  lisinopril-hydrochlorothiazide 10-12.5 mg tablet by mouth daily  6. Chronic pain syndrome  - traMADol (ULTRAM) 50 MG tablet; Take 1 tablet (50 mg total) by mouth every 8 (eight) hours as needed.  Dispense: 60 tablet; Refill: 0 Patient was extensively counseled about nutrition, and medication compliance Interpreter was used to communicate directly with patient for the entire encounter including providing detailed patient instructions.   Return in about 2 months (around 03/12/2014), or if symptoms worsen or fail to improve, for Hemoglobin A1C and Follow up, DM, Follow up HTN, Follow up Pain and comorbidities.  The patient was given clear instructions to go to ER or return to medical center if symptoms don't improve, worsen or new problems develop. The patient verbalized understanding. The patient was told to call to get lab results if they haven't heard anything in the next week.   This note has been created with Museum/gallery curator and smart  Company secretary. Any transcriptional errors are unintentional.    Angelica Chessman, MD, [place], [place], [place] and [place], [place]   01/09/2014, 9:52 AM

## 2014-01-09 NOTE — Progress Notes (Signed)
Pt is here following up on her HTN and diabetes. Pt was recently in the hospital.

## 2014-01-28 ENCOUNTER — Encounter: Payer: Self-pay | Admitting: *Deleted

## 2014-01-28 NOTE — Progress Notes (Unsigned)
PA for Farxiga 10 mg tablets submitted to Medicaid. Waiting for response from Medicaid. Vivia Birmingham, RN

## 2014-01-29 ENCOUNTER — Encounter: Payer: Self-pay | Admitting: *Deleted

## 2014-01-29 ENCOUNTER — Other Ambulatory Visit: Payer: Self-pay | Admitting: *Deleted

## 2014-01-29 DIAGNOSIS — E1159 Type 2 diabetes mellitus with other circulatory complications: Secondary | ICD-10-CM

## 2014-01-29 MED ORDER — CANAGLIFLOZIN 100 MG PO TABS: 100.0000 mg | ORAL_TABLET | Freq: Every day | ORAL | Status: DC

## 2014-01-29 NOTE — Progress Notes (Signed)
PA from [place] denied for Farxiga 10mg . Informed Dr. Doreene Burke that [place] was denied. Verbal order received from Dr. Doreene Burke for Invokana 100 mg daily. Prescription for Invokana 100 mg daily sent to patient's preferred pharmacy.

## 2014-02-05 ENCOUNTER — Encounter: Payer: Self-pay | Admitting: *Deleted

## 2014-02-05 NOTE — Progress Notes (Signed)
PA approved via [place] [employer] for Invokana 100 mg. Med approved for 02/05/2014 to 02/05/2015. [facility] informed. Confirmation Number [id number] W. Vivia Birmingham, RN

## 2014-02-05 NOTE — Progress Notes (Signed)
Received fax from [place] requesting prior authorization of [place]. PA completed in [place] [employer] for Invokana 100 mg. Will await approval and notify pharmacy of approval or denied. Vivia Birmingham, RN

## 2014-02-17 ENCOUNTER — Telehealth: Payer: Self-pay | Admitting: Internal Medicine

## 2014-02-17 NOTE — Telephone Encounter (Signed)
Nurse casemanager calling to f/u on a referral/order for home care services.  Pt is in desperate need of personal care services and has been waiting since June for a response.  Please f/u with Maritha.

## 2014-02-19 NOTE — Progress Notes (Signed)
[facility] Department of Health and [employer] Request for Independent Assessment for Personal Care Services (PCS) form began for personal care services to be initiated with [employer].  Form needs to be signed and completed by Chari Manning, NP (patient's PCP), and then must be faxed to [facility] [phone]) for processing and for independent assessment to occur.  Awaiting for form completion by PCP.

## 2014-02-19 NOTE — Telephone Encounter (Signed)
Spoke with Lorne Skeens, nurse case manager, regarding this patient and her need for personal care services. Lorne Skeens states that patient's sister is primary care giver for this patient. States patient is blind and confined to hospital bed. States that patient has [place] Access/Medicaid and needs referral form filled out for [facility] care This was discussed with Provider and Order for referral received.

## 2014-02-19 NOTE — Progress Notes (Signed)
Request for Independent Assessment for [place] of Medical Need completed by PCP and faxed to [employer].  Fax confirmation received.

## 2014-02-20 ENCOUNTER — Telehealth: Payer: Self-pay | Admitting: Internal Medicine

## 2014-02-20 NOTE — Telephone Encounter (Signed)
Pt has come in today with a loan default paper and would like to receive a letter from her PCP stating that she is disabled and unable to meet the requirements for the loan; the loan is from [employer]; [place] [address]; please f/u with patient about this request in detail to confirm;

## 2014-03-04 ENCOUNTER — Encounter: Payer: Self-pay | Admitting: *Deleted

## 2014-03-04 NOTE — Progress Notes (Unsigned)
Received fax from [facility] pharmacy requesting prior authorization of Onglyza 2.5 mg.  PA completed in [place] [employer] for Onglyza 2.5 mg. Will await approval and notify pharmacy of approval or denied. Vivia Birmingham, RN

## 2014-03-05 ENCOUNTER — Encounter: Payer: Self-pay | Admitting: *Deleted

## 2014-03-05 ENCOUNTER — Telehealth: Payer: Self-pay | Admitting: Internal Medicine

## 2014-03-05 NOTE — Telephone Encounter (Signed)
Pt.'s case manager Lorne Skeens) left vm wanting to follow up w/ a nurse in regards to status of referral for personal care services. Please refer to last encounter( telephone order dated 8/10). Please contact case Freight forwarder.

## 2014-03-05 NOTE — Progress Notes (Unsigned)
Patient denied in [place] for Onglyza 2.5 mg twice daily.

## 2014-03-06 ENCOUNTER — Ambulatory Visit: Payer: Medicaid Other | Attending: Internal Medicine | Admitting: Internal Medicine

## 2014-03-06 ENCOUNTER — Encounter: Payer: Self-pay | Admitting: Internal Medicine

## 2014-03-06 ENCOUNTER — Telehealth: Payer: Self-pay

## 2014-03-06 VITALS — BP 160/93 | HR 100 | Temp 98.8°F | Resp 18

## 2014-03-06 DIAGNOSIS — R7309 Other abnormal glucose: Secondary | ICD-10-CM | POA: Diagnosis not present

## 2014-03-06 DIAGNOSIS — R0989 Other specified symptoms and signs involving the circulatory and respiratory systems: Secondary | ICD-10-CM

## 2014-03-06 DIAGNOSIS — R739 Hyperglycemia, unspecified: Secondary | ICD-10-CM

## 2014-03-06 DIAGNOSIS — M7989 Other specified soft tissue disorders: Secondary | ICD-10-CM

## 2014-03-06 LAB — GLUCOSE, POCT (MANUAL RESULT ENTRY)
POC Glucose: 446 mg/dl — AB (ref 70–99)
POC Glucose: 353 mg/dl — AB (ref 70–99)

## 2014-03-06 LAB — POCT GLYCOSYLATED HEMOGLOBIN (HGB A1C): Hemoglobin A1C: 14.9

## 2014-03-06 MED ORDER — INSULIN ASPART 100 UNIT/ML ~~LOC~~ SOLN
10.0000 [IU] | Freq: Once | SUBCUTANEOUS | Status: AC
  Administered 2014-03-06: 10 [IU] via SUBCUTANEOUS

## 2014-03-06 NOTE — Progress Notes (Signed)
Pt stated legs swelling and turning bleu  Question about medication

## 2014-03-06 NOTE — Telephone Encounter (Signed)
Martica from home health care called to confirm Services for personal care had been faxed to [facility] She is aware the paperwork was faxed

## 2014-03-06 NOTE — Progress Notes (Signed)
Patient ID: Debbie [name], female   DOB: 1960/12/08, 53 y.o.   MRN: [id number]  CC: leg swelling  HPI:  Patient presents with her family who is helping to interpret/translate.  She states that she has been having difficulty with feeling full in her throat and believes that her thyroid is big.  She family states that her last TSH was normal and was found to have a nodule on her thyroid.  She is unsure if they were due to follow up with endocrinology.  Family reports that her legs have been severely swollen and painful to touch for the past two weeks.  Patient is unable to inform me if she has had SOB, chest pain, or heart palpitations.  Allergies  Allergen Reactions  . Penicillins Rash   Past Medical History  Diagnosis Date  . Type II or unspecified type diabetes mellitus with unspecified complication, uncontrolled     x ~ 10 years  . Hypertension     x ~ 10 years  . Hypothyroidism   . Chest pain     a. ? MI in 2010, [place]->medically managed  . Diabetic foot ulcer   . Obesity   . Cataract   . Blind    Current Outpatient Prescriptions on File Prior to Visit  Medication Sig Dispense Refill  . acetaminophen (TYLENOL) 325 MG tablet Take 2 tablets (650 mg total) by mouth every 6 (six) hours as needed.      . Canagliflozin 100 MG TABS Take 1 tablet (100 mg total) by mouth daily.  30 tablet  3  . clotrimazole (LOTRIMIN) 1 % cream Apply 1 application topically 2 (two) times daily.  60 g  3  . diazepam (VALIUM) 2 MG tablet Take 1 tablet (2 mg total) by mouth every 8 (eight) hours as needed for anxiety.  60 tablet  0  . docusate sodium 100 MG CAPS Take 100 mg by mouth 2 (two) times daily.  10 capsule  0  . insulin aspart (NOVOLOG) 100 UNIT/ML injection Inject 0-15 Units into the skin 3 (three) times daily with meals.  10 mL  11  . insulin glargine (LANTUS) 100 UNIT/ML injection Inject 0.3 mLs (30 Units total) into the skin daily.  10 mL  1  . lisinopril-hydrochlorothiazide (PRINZIDE,ZESTORETIC)  10-12.5 MG per tablet Take 1 tablet by mouth daily.  90 tablet  3  . loteprednol (LOTEMAX) 0.5 % ophthalmic suspension Place 1 drop into both eyes 2 (two) times daily.  15 mL  3  . propranolol ER (INDERAL LA) 60 MG 24 hr capsule Take 1 capsule (60 mg total) by mouth daily.  90 capsule  3  . saxagliptin HCl (ONGLYZA) 2.5 MG TABS tablet Take 2 tablets (5 mg total) by mouth daily.  180 tablet  3  . senna (SENOKOT) 8.6 MG TABS Take 2 tablets (17.2 mg total) by mouth at bedtime as needed (for constipation).  30 each  0  . sertraline (ZOLOFT) 50 MG tablet Take 1 tablet (50 mg total) by mouth daily.  90 tablet  3  . traMADol (ULTRAM) 50 MG tablet Take 1 tablet (50 mg total) by mouth every 8 (eight) hours as needed.  60 tablet  0   No current facility-administered medications on file prior to visit.   Family History  Problem Relation Age of Onset  . Diabetes Father     died in his 1's?  . Diabetes Father     died in her 76's?  . Diabetes Sister  alive and well.   History   Social History  . Marital Status: Single    Spouse Name: N/A    Number of Children: N/A  . Years of Education: N/A   Occupational History  . Not on file.   Social History Main Topics  . Smoking status: Never Smoker   . Smokeless tobacco: Not on file  . Alcohol Use: No  . Drug Use: No  . Sexual Activity: No   Other Topics Concern  . Not on file   Social History Narrative   From [place], [place].  Moved to [place] to live with sister in April of 2014.  She does not routinely exercise.   Review of Systems  Unable to perform ROS: language      Objective:   Filed Vitals:   03/06/14 1704  BP: 160/93  Pulse: 100  Temp: 98.8 F (37.1 C)  Resp: 18   Physical Exam  Constitutional: No distress.  Cardiovascular: Normal rate and normal heart sounds.   Pulses:      Dorsalis pedis pulses are 1+ on the right side, and 2+ on the left side.  Pulmonary/Chest: Effort normal and breath sounds normal.   Musculoskeletal: She exhibits edema and tenderness (calf tightness and warmth).  In wheelchair   Skin: She is not diaphoretic.     Lab Results  Component Value Date   WBC 8.9 11/20/2013   HGB 14.0 11/20/2013   HCT 43.1 11/20/2013   MCV 82.4 11/20/2013   PLT 229 11/20/2013   Lab Results  Component Value Date   CREATININE 0.54 11/20/2013   BUN 12 11/20/2013   NA 141 11/20/2013   K 4.3 11/20/2013   CL 107 11/20/2013   CO2 27 11/20/2013    Lab Results  Component Value Date   HGBA1C 14.9 03/06/2014   Lipid Panel     Component Value Date/Time   CHOL 200 09/19/2013 1617   TRIG 146 09/19/2013 1617   HDL 56 09/19/2013 1617   CHOLHDL 3.6 09/19/2013 1617   VLDL 29 09/19/2013 1617   LDLCALC 115* 09/19/2013 1617       Assessment and plan:   [name] was seen today for leg swelling.  Diagnoses and associated orders for this visit:  Hyperglycemia - Glucose (CBG) - HgB A1c - insulin aspart (novoLOG) injection 10 Units; Inject 0.1 mLs (10 Units total) into the skin once. - POCT glucose (manual entry)  Diminished pulses in lower extremity - Lower Extremity Arterial Doppler Right; Future r/o DVT and PVD  Leg swelling - Lower Extremity Venous Duplex Bilateral; Future - CBC - COMPLETE METABOLIC PANEL WITH GFR - TSH        Chari Manning, NP-C [facility] [phone] 03/06/2014, 5:41 PM

## 2014-03-07 LAB — COMPLETE METABOLIC PANEL WITH GFR
ALT: 118 U/L — ABNORMAL HIGH (ref 0–35)
AST: 57 U/L — ABNORMAL HIGH (ref 0–37)
Albumin: 3.8 g/dL (ref 3.5–5.2)
Alkaline Phosphatase: 170 U/L — ABNORMAL HIGH (ref 39–117)
BILIRUBIN TOTAL: 0.4 mg/dL (ref 0.2–1.2)
BUN: 19 mg/dL (ref 6–23)
CALCIUM: 9.2 mg/dL (ref 8.4–10.5)
CHLORIDE: 101 meq/L (ref 96–112)
CO2: 26 meq/L (ref 19–32)
CREATININE: 0.75 mg/dL (ref 0.50–1.10)
GFR, Est Non African American: >89 mL/min
Glucose, Bld: 390 mg/dL — ABNORMAL HIGH (ref 70–99)
Potassium: 5 mEq/L (ref 3.5–5.3)
Sodium: 137 mEq/L (ref 135–145)
Total Protein: 6.4 g/dL (ref 6.0–8.3)

## 2014-03-07 LAB — CBC
HEMATOCRIT: 46.1 % — AB (ref 36.0–46.0)
Hemoglobin: 14.9 g/dL (ref 12.0–15.0)
MCH: 26.8 pg (ref 26.0–34.0)
MCHC: 32.3 g/dL (ref 30.0–36.0)
MCV: 83.1 fL (ref 78.0–100.0)
Platelets: 252 10*3/uL (ref 150–400)
RBC: 5.55 MIL/uL — AB (ref 3.87–5.11)
RDW: 13.7 % (ref 11.5–15.5)
WBC: 9.2 10*3/uL (ref 4.0–10.5)

## 2014-03-07 LAB — TSH: TSH: 1.029 u[IU]/mL (ref 0.350–4.500)

## 2014-03-13 ENCOUNTER — Telehealth: Payer: Self-pay | Admitting: *Deleted

## 2014-03-13 NOTE — Telephone Encounter (Signed)
Left message on VM to return my call. 

## 2014-03-13 NOTE — Telephone Encounter (Signed)
Message copied by Joan Mayans on Thu Mar 13, 2014  3:45 PM ------      Message from: Chari Manning A      Created: Wed Mar 12, 2014 11:12 PM       Liver function test are elevated.  Please make sure she avoids tylenol and alcohol.  TSH is normal. Not anemic ------

## 2014-03-17 ENCOUNTER — Encounter (HOSPITAL_COMMUNITY): Payer: Self-pay

## 2014-03-17 ENCOUNTER — Ambulatory Visit (HOSPITAL_COMMUNITY): Payer: Medicaid Other

## 2014-03-18 ENCOUNTER — Ambulatory Visit (HOSPITAL_COMMUNITY)
Admission: RE | Admit: 2014-03-18 | Discharge: 2014-03-18 | Disposition: A | Discharge status: Home / Self Care | Payer: Medicaid Other | Source: Ambulatory Visit

## 2014-03-18 ENCOUNTER — Ambulatory Visit (HOSPITAL_COMMUNITY)
Admission: RE | Admit: 2014-03-18 | Discharge: 2014-03-18 | Disposition: A | Discharge status: Home / Self Care | Payer: Medicaid Other | Source: Ambulatory Visit | Attending: Internal Medicine | Admitting: Internal Medicine

## 2014-03-18 DIAGNOSIS — R0989 Other specified symptoms and signs involving the circulatory and respiratory systems: Secondary | ICD-10-CM | POA: Insufficient documentation

## 2014-03-18 DIAGNOSIS — M79609 Pain in unspecified limb: Secondary | ICD-10-CM | POA: Insufficient documentation

## 2014-03-18 DIAGNOSIS — M7989 Other specified soft tissue disorders: Secondary | ICD-10-CM

## 2014-03-18 NOTE — Progress Notes (Signed)
VASCULAR LAB PRELIMINARY  PRELIMINARY  PRELIMINARY  PRELIMINARY  Bilateral lower extremity venous duplex  completed.    Preliminary report:  Bilateral:  No evidence of DVT, superficial thrombosis, or Baker's Cyst.  ABIs were also ordered, but patient unable to bear the blood pressure cuffs.  Popliteal arteries and PTAs were imaged with the venous study and are within normal limits.    Jozelynn Danielson, RVT 03/18/2014, 12:33 PM

## 2014-04-17 ENCOUNTER — Other Ambulatory Visit: Payer: Self-pay | Admitting: Internal Medicine

## 2014-04-21 ENCOUNTER — Telehealth: Payer: Self-pay | Admitting: Internal Medicine

## 2014-04-21 NOTE — Telephone Encounter (Signed)
Pharmacy called stating that pt. Could not get their medication refilled for Onglyza 2.5mg  because it needs prior authorization from medicaid in order for medication to be refilled. Please f/u with pharmacy

## 2014-04-24 NOTE — Telephone Encounter (Signed)
Mollie from [facility] will fax over PA form

## 2014-04-29 ENCOUNTER — Telehealth: Payer: Self-pay | Admitting: Internal Medicine

## 2014-04-29 NOTE — Telephone Encounter (Signed)
Pt's sister  

## 2014-05-06 NOTE — Telephone Encounter (Signed)
Debbie [name] from home health care called to confirm  Services for personal care had been [facility]ed [facility]  She is aware the paperwork was faxed

## 2014-05-13 ENCOUNTER — Telehealth: Payer: Self-pay | Admitting: Internal Medicine

## 2014-05-13 NOTE — Telephone Encounter (Signed)
Patient's sister is calling to request a letter from PCP detailing patient's medical condition; please f/u with patient

## 2014-05-13 NOTE — Telephone Encounter (Signed)
Expand All Collapse All   Patient's sister is calling to request a letter from PCP detailing patient's medical condition; please f/u with patient

## 2014-06-26 ENCOUNTER — Other Ambulatory Visit: Payer: Self-pay | Admitting: Internal Medicine

## 2014-07-09 ENCOUNTER — Other Ambulatory Visit: Payer: Self-pay | Admitting: Internal Medicine

## 2014-07-09 NOTE — Telephone Encounter (Signed)
Pharmacy called to request medication refill for saxagliptin HCl (ONGLYZA) 2.5 MG , (NOVOLOG) 100 UNIT/ML injection, and (LANTUS) 100 UNIT/ML injection. Pharmacy states that they have faxed over a couple of requests but have not herd anything back. Please f/u with pharmacy.

## 2014-07-17 NOTE — Telephone Encounter (Signed)
Pt has refills Rx Novolog, advice to call pharmacy for refills Pt has appointment F/U PCP

## 2014-07-17 NOTE — Telephone Encounter (Signed)
Pt's sister is calling for refill on all her sister's medications, says she is out of all of them. Pt has appt scheduled on 07/24/14. Please f/u with pt.

## 2014-07-18 ENCOUNTER — Telehealth: Payer: Self-pay | Admitting: Internal Medicine

## 2014-07-18 ENCOUNTER — Other Ambulatory Visit: Payer: Self-pay | Admitting: Internal Medicine

## 2014-07-18 NOTE — Telephone Encounter (Signed)
Pt's sister following up on travel paperwork. Please f/u with pt.

## 2014-07-24 ENCOUNTER — Encounter: Payer: Self-pay | Admitting: Internal Medicine

## 2014-07-24 ENCOUNTER — Ambulatory Visit: Payer: Medicaid Other | Attending: Internal Medicine | Admitting: Internal Medicine

## 2014-07-24 VITALS — BP 160/82 | HR 93 | Temp 97.6°F | Resp 18

## 2014-07-24 DIAGNOSIS — G252 Other specified forms of tremor: Secondary | ICD-10-CM | POA: Insufficient documentation

## 2014-07-24 DIAGNOSIS — E1051 Type 1 diabetes mellitus with diabetic peripheral angiopathy without gangrene: Secondary | ICD-10-CM

## 2014-07-24 DIAGNOSIS — R739 Hyperglycemia, unspecified: Secondary | ICD-10-CM

## 2014-07-24 DIAGNOSIS — X58XXXA Exposure to other specified factors, initial encounter: Secondary | ICD-10-CM | POA: Diagnosis not present

## 2014-07-24 DIAGNOSIS — E1310 Other specified diabetes mellitus with ketoacidosis without coma: Secondary | ICD-10-CM

## 2014-07-24 DIAGNOSIS — F419 Anxiety disorder, unspecified: Secondary | ICD-10-CM | POA: Insufficient documentation

## 2014-07-24 DIAGNOSIS — Z993 Dependence on wheelchair: Secondary | ICD-10-CM | POA: Diagnosis not present

## 2014-07-24 DIAGNOSIS — F4310 Post-traumatic stress disorder, unspecified: Secondary | ICD-10-CM | POA: Diagnosis not present

## 2014-07-24 DIAGNOSIS — I10 Essential (primary) hypertension: Secondary | ICD-10-CM | POA: Diagnosis not present

## 2014-07-24 DIAGNOSIS — Z9114 Patient's other noncompliance with medication regimen: Secondary | ICD-10-CM | POA: Insufficient documentation

## 2014-07-24 DIAGNOSIS — H269 Unspecified cataract: Secondary | ICD-10-CM | POA: Insufficient documentation

## 2014-07-24 DIAGNOSIS — Z794 Long term (current) use of insulin: Secondary | ICD-10-CM | POA: Insufficient documentation

## 2014-07-24 DIAGNOSIS — R32 Unspecified urinary incontinence: Secondary | ICD-10-CM | POA: Diagnosis not present

## 2014-07-24 DIAGNOSIS — E11621 Type 2 diabetes mellitus with foot ulcer: Secondary | ICD-10-CM | POA: Diagnosis not present

## 2014-07-24 DIAGNOSIS — R350 Frequency of micturition: Secondary | ICD-10-CM | POA: Diagnosis not present

## 2014-07-24 DIAGNOSIS — E1059 Type 1 diabetes mellitus with other circulatory complications: Secondary | ICD-10-CM

## 2014-07-24 DIAGNOSIS — E1110 Type 2 diabetes mellitus with ketoacidosis without coma: Secondary | ICD-10-CM

## 2014-07-24 LAB — GLUCOSE, POCT (MANUAL RESULT ENTRY)
POC Glucose: 514 mg/dl — AB (ref 70–99)
POC Glucose: 444 mg/dl — AB (ref 70–99)

## 2014-07-24 LAB — POCT GLYCOSYLATED HEMOGLOBIN (HGB A1C): Hemoglobin A1C: >15

## 2014-07-24 MED ORDER — INSULIN ASPART 100 UNIT/ML ~~LOC~~ SOLN
20.0000 [IU] | Freq: Once | SUBCUTANEOUS | Status: AC
  Administered 2014-07-24: 20 [IU] via SUBCUTANEOUS

## 2014-07-24 MED ORDER — LISINOPRIL-HYDROCHLOROTHIAZIDE 10-12.5 MG PO TABS: 1.0000 | ORAL_TABLET | Freq: Every day | ORAL | Status: DC

## 2014-07-24 MED ORDER — SERTRALINE HCL 50 MG PO TABS: 50.0000 mg | ORAL_TABLET | Freq: Every day | ORAL | Status: DC

## 2014-07-24 MED ORDER — DIAZEPAM 2 MG PO TABS: 2.0000 mg | ORAL_TABLET | Freq: Three times a day (TID) | ORAL | Status: DC | PRN

## 2014-07-24 MED ORDER — INSULIN GLARGINE 100 UNIT/ML ~~LOC~~ SOLN: 45.0000 [IU] | Freq: Every day | SUBCUTANEOUS | Status: DC

## 2014-07-24 MED ORDER — INSULIN ASPART 100 UNIT/ML ~~LOC~~ SOLN: 0.0000 [IU] | Freq: Three times a day (TID) | SUBCUTANEOUS | Status: DC

## 2014-07-24 MED ORDER — SAXAGLIPTIN HCL 2.5 MG PO TABS: 5.0000 mg | ORAL_TABLET | Freq: Every day | ORAL | Status: DC

## 2014-07-24 MED ORDER — DSS 100 MG PO CAPS: 100.0000 mg | ORAL_CAPSULE | Freq: Two times a day (BID) | ORAL | Status: DC

## 2014-07-24 MED ORDER — PROPRANOLOL HCL ER 60 MG PO CP24: 60.0000 mg | ORAL_CAPSULE | Freq: Every day | ORAL | Status: DC

## 2014-07-24 NOTE — Progress Notes (Signed)
Pt sister brought pt in for 3 month f/u HTN, uncontrolled DM with insulin injection Sister states she takes 3 units Lantus @ bedtime freq urination noted Pt is w/c bound  CBg- 514, 20 units given per protocol Unable to obtain urine sample BP elevated- 160/82 Arabic interpretor present  Need flu vaccine  A1c->15.0

## 2014-07-24 NOTE — Patient Instructions (Signed)

## 2014-07-24 NOTE — Progress Notes (Signed)
Patient ID: Debbie [name], female   DOB: January 02, 1961, 54 y.o.   MRN: [id number]   Debbie [name], is a 54 y.o. female  [id number]  [id number]  DOB - 1960/11/02  Chief Complaint  Patient presents with  . Follow-up  . Diabetes  . Hypertension        Subjective:   Debbie [name] is a 54 y.o. female here today for a follow up visit. Patient was brought in by her sister for help of hypertension and diabetes mellitus uncontrolled requiring insulin injection. Patient has bilateral cataract and not able to see, depends on family members for insulin injections, patient is noncompliant with oral medications as well. Patient is wheelchair bound, continues to have tremors both hands especially intention tremors. There is complaint of frequent urination/incontinence. She needs refills on all her medications. Patient has No headache, No chest pain, No abdominal pain - No Nausea, No new weakness tingling or numbness, No Cough - SOB.  Problem  DM (Diabetes Mellitus) Type I Controlled, Peripheral Vascular Disorder    ALLERGIES: Allergies  Allergen Reactions  . Penicillins Rash    PAST MEDICAL HISTORY: Past Medical History  Diagnosis Date  . Type II or unspecified type diabetes mellitus with unspecified complication, uncontrolled     x ~ 10 years  . Hypertension     x ~ 10 years  . Hypothyroidism   . Chest pain     a. ? MI in 2010, [place]->medically managed  . Diabetic foot ulcer   . Obesity   . Cataract   . Blind     MEDICATIONS AT HOME: Prior to Admission medications   Medication Sig Start Date End Date Taking? Authorizing Provider  acetaminophen (TYLENOL) 325 MG tablet Take 2 tablets (650 mg total) by mouth every 6 (six) hours as needed. 01/02/13   Nita Sells, MD  Canagliflozin 100 MG TABS Take 1 tablet (100 mg total) by mouth daily. 01/29/14   Tresa Garter, MD  clotrimazole (LOTRIMIN) 1 % cream Apply 1 application topically 2 (two) times daily. 01/09/14   Tresa Garter, MD  diazepam (VALIUM) 2 MG tablet Take 1 tablet (2 mg total) by mouth every 8 (eight) hours as needed for anxiety. 07/24/14   Tresa Garter, MD  Docusate Sodium (DSS) 100 MG CAPS Take 100 mg by mouth 2 (two) times daily. 07/24/14   Tresa Garter, MD  insulin aspart (NOVOLOG) 100 UNIT/ML injection Inject 0-15 Units into the skin 3 (three) times daily with meals. 07/24/14   Tresa Garter, MD  insulin glargine (LANTUS) 100 UNIT/ML injection Inject 0.45 mLs (45 Units total) into the skin at bedtime. 07/24/14   Tresa Garter, MD  lisinopril-hydrochlorothiazide (PRINZIDE,ZESTORETIC) 10-12.5 MG per tablet Take 1 tablet by mouth daily. 07/24/14   Tresa Garter, MD  loteprednol (LOTEMAX) 0.5 % ophthalmic suspension Place 1 drop into both eyes 2 (two) times daily. 01/09/14   Tresa Garter, MD  propranolol ER (INDERAL LA) 60 MG 24 hr capsule Take 1 capsule (60 mg total) by mouth daily. 07/24/14   Tresa Garter, MD  saxagliptin HCl (ONGLYZA) 2.5 MG TABS tablet Take 2 tablets (5 mg total) by mouth daily. 07/24/14   Tresa Garter, MD  senna (SENOKOT) 8.6 MG TABS Take 2 tablets (17.2 mg total) by mouth at bedtime as needed (for constipation). 01/19/13   Sheila Oats, MD  sertraline (ZOLOFT) 50 MG tablet Take 1 tablet (50 mg total) by mouth daily. 07/24/14  Tresa Garter, MD  traMADol (ULTRAM) 50 MG tablet Take 1 tablet (50 mg total) by mouth every 8 (eight) hours as needed. 01/09/14   Tresa Garter, MD     Objective:   Filed Vitals:   07/24/14 1432  BP: 160/82  Pulse: 93  Temp: 97.6 F (36.4 C)  TempSrc: Oral  Resp: 18  SpO2: 96%    Exam General appearance : Awake, alert, not in any distress. Speech Clear. Not toxic looking, morbidly obese, intention tremor bilaterally. HEENT: Atraumatic and Normocephalic, pupils equally reactive to light and accomodation Neck: supple, no JVD. No cervical lymphadenopathy.  Chest:Good air entry  bilaterally, no added sounds  CVS: S1 S2 regular, no murmurs.  Abdomen: Full, Bowel sounds present, Non tender and not distended with no gaurding, rigidity or rebound. Extremities: B/L Lower Ext shows no edema, both legs are warm to touch Neurology: Awake alert, and oriented X 3, wheelchair bound because of morbid obesity and visual handicap  Data Review Lab Results  Component Value Date   HGBA1C >15.0 07/24/2014   HGBA1C 14.9 03/06/2014   HGBA1C 15.1* 11/19/2013     Assessment & Plan   1. DM (diabetes mellitus) type I controlled, peripheral vascular disorder  - HgB A1c >15% - Glucose (CBG) Increase Lantus insulin to 45 units - insulin glargine (LANTUS) 100 UNIT/ML injection; Inject 0.45 mLs (45 Units total) into the skin at bedtime.  Dispense: 30 mL; Refill: 3 - insulin aspart (NOVOLOG) 100 UNIT/ML injection; Inject 0-15 Units into the skin 3 (three) times daily with meals.  Dispense: 10 mL; Refill: 11  2. Hyperglycemia  - insulin aspart (novoLOG) injection 20 Units; Inject 0.2 mLs (20 Units total) into the skin once.  3. PTSD (post-traumatic stress disorder)  - sertraline (ZOLOFT) 50 MG tablet; Take 1 tablet (50 mg total) by mouth daily.  Dispense: 90 tablet; Refill: 3 - Docusate Sodium (DSS) 100 MG CAPS; Take 100 mg by mouth 2 (two) times daily.  Dispense: 30 each; Refill: 3 - diazepam (VALIUM) 2 MG tablet; Take 1 tablet (2 mg total) by mouth every 8 (eight) hours as needed for anxiety.  Dispense: 60 tablet; Refill: 0  4. DM (diabetes mellitus) type 2, uncontrolled, with ketoacidosis  - saxagliptin HCl (ONGLYZA) 2.5 MG TABS tablet; Take 2 tablets (5 mg total) by mouth daily.  Dispense: 180 tablet; Refill: 3  5. Essential hypertension  - propranolol ER (INDERAL LA) 60 MG 24 hr capsule; Take 1 capsule (60 mg total) by mouth daily.  Dispense: 90 capsule; Refill: 3 - lisinopril-hydrochlorothiazide (PRINZIDE,ZESTORETIC) 10-12.5 MG per tablet; Take 1 tablet by mouth daily.   Dispense: 90 tablet; Refill: 3   Interpreter was used to communicate directly with patient for the entire encounter including providing detailed patient instructions.    Return in about 3 months (around 10/23/2014), or if symptoms worsen or fail to improve, for Hemoglobin A1C and Follow up, DM, Follow up HTN, Follow up Pain and comorbidities.  The patient was given clear instructions to go to ER or return to medical center if symptoms don't improve, worsen or new problems develop. The patient verbalized understanding. The patient was told to call to get lab results if they haven't heard anything in the next week.   This note has been created with Surveyor, quantity. Any transcriptional errors are unintentional.    Angelica Chessman, MD, [place], [place], [place] and [facility] [place], [place]   07/24/2014,  3:36 PM

## 2014-07-25 ENCOUNTER — Telehealth: Payer: Self-pay | Admitting: Internal Medicine

## 2014-07-25 NOTE — Telephone Encounter (Signed)
Pt's sister walked in and dropped off a travel loan program form along with a print out of a note from [employer]. She is waiting for dr. Doreene Burke to write a letter stating that she is disabled and unable to pay the loan. I am not sure if a letter from her pcp stating her condition will resolve this, so I have referred her to [employer] so that they can look into the loan and see if she can do a forbearance request or something.

## 2014-08-27 ENCOUNTER — Other Ambulatory Visit: Payer: Self-pay | Admitting: Internal Medicine

## 2014-08-29 ENCOUNTER — Telehealth: Payer: Self-pay | Admitting: Emergency Medicine

## 2014-08-29 NOTE — Telephone Encounter (Signed)
Patient needs to come by office to pick up paperwork

## 2014-09-08 ENCOUNTER — Other Ambulatory Visit: Payer: Self-pay | Admitting: Internal Medicine

## 2014-12-16 ENCOUNTER — Other Ambulatory Visit: Payer: Self-pay | Admitting: Internal Medicine

## 2014-12-29 ENCOUNTER — Telehealth: Payer: Self-pay | Admitting: Internal Medicine

## 2014-12-29 NOTE — Telephone Encounter (Signed)
[facility] called requesting medication refill status on INVOKANA 100 MG TABS tablet clotrimazole (LOTRIMIN) 1 % cream, saxagliptin HCl (ONGLYZA) 2.5 MG TABS tablet

## 2014-12-30 ENCOUNTER — Other Ambulatory Visit: Payer: Self-pay | Admitting: *Deleted

## 2014-12-30 ENCOUNTER — Other Ambulatory Visit: Payer: Self-pay | Admitting: Internal Medicine

## 2014-12-30 DIAGNOSIS — B369 Superficial mycosis, unspecified: Secondary | ICD-10-CM

## 2014-12-30 DIAGNOSIS — E1110 Type 2 diabetes mellitus with ketoacidosis without coma: Secondary | ICD-10-CM

## 2014-12-30 MED ORDER — CANAGLIFLOZIN 100 MG PO TABS: ORAL_TABLET | ORAL | Status: DC

## 2014-12-30 MED ORDER — SAXAGLIPTIN HCL 2.5 MG PO TABS: 5.0000 mg | ORAL_TABLET | Freq: Every day | ORAL | Status: DC

## 2014-12-30 MED ORDER — CLOTRIMAZOLE 1 % EX CREA: 1.0000 "application " | TOPICAL_CREAM | Freq: Two times a day (BID) | CUTANEOUS | Status: DC

## 2015-01-01 ENCOUNTER — Other Ambulatory Visit: Payer: Self-pay | Admitting: *Deleted

## 2015-01-01 ENCOUNTER — Other Ambulatory Visit: Payer: Self-pay | Admitting: Internal Medicine

## 2015-01-01 DIAGNOSIS — F4310 Post-traumatic stress disorder, unspecified: Secondary | ICD-10-CM

## 2015-01-01 DIAGNOSIS — E1051 Type 1 diabetes mellitus with diabetic peripheral angiopathy without gangrene: Secondary | ICD-10-CM

## 2015-01-01 DIAGNOSIS — I10 Essential (primary) hypertension: Secondary | ICD-10-CM

## 2015-01-01 MED ORDER — INSULIN GLARGINE 100 UNIT/ML ~~LOC~~ SOLN: 45.0000 [IU] | Freq: Every day | SUBCUTANEOUS | Status: DC

## 2015-01-01 MED ORDER — SERTRALINE HCL 50 MG PO TABS: 50.0000 mg | ORAL_TABLET | Freq: Every day | ORAL | Status: DC

## 2015-01-01 MED ORDER — PROPRANOLOL HCL ER 60 MG PO CP24: 60.0000 mg | ORAL_CAPSULE | Freq: Every day | ORAL | Status: DC

## 2015-01-01 MED ORDER — INSULIN ASPART 100 UNIT/ML ~~LOC~~ SOLN: 0.0000 [IU] | Freq: Three times a day (TID) | SUBCUTANEOUS | Status: DC

## 2015-01-01 MED ORDER — LISINOPRIL-HYDROCHLOROTHIAZIDE 10-12.5 MG PO TABS: 1.0000 | ORAL_TABLET | Freq: Every day | ORAL | Status: DC

## 2015-01-01 NOTE — Telephone Encounter (Signed)
[facility] called for refill on insulin.  Gave enough medication until patient could make appointment.

## 2015-01-06 ENCOUNTER — Other Ambulatory Visit: Payer: Self-pay | Admitting: Internal Medicine

## 2015-01-09 ENCOUNTER — Other Ambulatory Visit: Payer: Self-pay | Admitting: Internal Medicine

## 2015-01-09 DIAGNOSIS — E1165 Type 2 diabetes mellitus with hyperglycemia: Secondary | ICD-10-CM

## 2015-01-09 MED ORDER — ACCU-CHEK SOFT TOUCH LANCETS MISC: Status: DC

## 2015-01-09 MED ORDER — GLUCOSE BLOOD VI STRP: 1.0000 | ORAL_STRIP | Status: DC

## 2015-02-05 ENCOUNTER — Ambulatory Visit (HOSPITAL_BASED_OUTPATIENT_CLINIC_OR_DEPARTMENT_OTHER): Payer: Medicaid Other | Admitting: Internal Medicine

## 2015-02-05 ENCOUNTER — Ambulatory Visit (HOSPITAL_COMMUNITY)
Admission: RE | Admit: 2015-02-05 | Discharge: 2015-02-05 | Disposition: A | Discharge status: Home / Self Care | Payer: Medicaid Other | Source: Ambulatory Visit | Attending: Internal Medicine | Admitting: Internal Medicine

## 2015-02-05 ENCOUNTER — Encounter: Payer: Self-pay | Admitting: Internal Medicine

## 2015-02-05 VITALS — BP 148/78 | HR 93 | Temp 98.2°F | Resp 18

## 2015-02-05 DIAGNOSIS — G894 Chronic pain syndrome: Secondary | ICD-10-CM | POA: Insufficient documentation

## 2015-02-05 DIAGNOSIS — E1165 Type 2 diabetes mellitus with hyperglycemia: Secondary | ICD-10-CM

## 2015-02-05 DIAGNOSIS — Y92009 Unspecified place in unspecified non-institutional (private) residence as the place of occurrence of the external cause: Secondary | ICD-10-CM

## 2015-02-05 DIAGNOSIS — F4310 Post-traumatic stress disorder, unspecified: Secondary | ICD-10-CM

## 2015-02-05 DIAGNOSIS — E1159 Type 2 diabetes mellitus with other circulatory complications: Secondary | ICD-10-CM

## 2015-02-05 DIAGNOSIS — I7090 Unspecified atherosclerosis: Secondary | ICD-10-CM | POA: Insufficient documentation

## 2015-02-05 DIAGNOSIS — I739 Peripheral vascular disease, unspecified: Secondary | ICD-10-CM

## 2015-02-05 DIAGNOSIS — I10 Essential (primary) hypertension: Secondary | ICD-10-CM | POA: Diagnosis not present

## 2015-02-05 DIAGNOSIS — W19XXXS Unspecified fall, sequela: Secondary | ICD-10-CM

## 2015-02-05 DIAGNOSIS — M25532 Pain in left wrist: Secondary | ICD-10-CM | POA: Diagnosis present

## 2015-02-05 DIAGNOSIS — W19XXXA Unspecified fall, initial encounter: Secondary | ICD-10-CM | POA: Insufficient documentation

## 2015-02-05 DIAGNOSIS — E1151 Type 2 diabetes mellitus with diabetic peripheral angiopathy without gangrene: Secondary | ICD-10-CM

## 2015-02-05 LAB — CBC WITH DIFFERENTIAL/PLATELET
BASOS ABS: 0.1 10*3/uL (ref 0.0–0.1)
Basophils Relative: 1 % (ref 0–1)
EOS PCT: 2 % (ref 0–5)
Eosinophils Absolute: 0.2 10*3/uL (ref 0.0–0.7)
HEMATOCRIT: 43.8 % (ref 36.0–46.0)
Hemoglobin: 14.2 g/dL (ref 12.0–15.0)
LYMPHS PCT: 56 % — AB (ref 12–46)
Lymphs Abs: 4.5 10*3/uL — ABNORMAL HIGH (ref 0.7–4.0)
MCH: 26.7 pg (ref 26.0–34.0)
MCHC: 32.4 g/dL (ref 30.0–36.0)
MCV: 82.5 fL (ref 78.0–100.0)
MONO ABS: 0.3 10*3/uL (ref 0.1–1.0)
MPV: 11.2 fL (ref 8.6–12.4)
Monocytes Relative: 4 % (ref 3–12)
Neutro Abs: 3 10*3/uL (ref 1.7–7.7)
Neutrophils Relative %: 37 % — ABNORMAL LOW (ref 43–77)
PLATELETS: 256 10*3/uL (ref 150–400)
RBC: 5.31 MIL/uL — AB (ref 3.87–5.11)
RDW: 14.7 % (ref 11.5–15.5)
WBC: 8.1 10*3/uL (ref 4.0–10.5)

## 2015-02-05 LAB — POCT GLYCOSYLATED HEMOGLOBIN (HGB A1C): HEMOGLOBIN A1C: 13.5

## 2015-02-05 LAB — GLUCOSE, POCT (MANUAL RESULT ENTRY): POC GLUCOSE: 431 mg/dL — AB (ref 70–99)

## 2015-02-05 MED ORDER — ACETAMINOPHEN-CODEINE #3 300-30 MG PO TABS: 1.0000 | ORAL_TABLET | ORAL | Status: DC | PRN

## 2015-02-05 MED ORDER — LISINOPRIL-HYDROCHLOROTHIAZIDE 10-12.5 MG PO TABS: 1.0000 | ORAL_TABLET | Freq: Every day | ORAL | Status: DC

## 2015-02-05 MED ORDER — SERTRALINE HCL 50 MG PO TABS: 50.0000 mg | ORAL_TABLET | Freq: Every day | ORAL | Status: DC

## 2015-02-05 MED ORDER — SAXAGLIPTIN HCL 2.5 MG PO TABS: 5.0000 mg | ORAL_TABLET | Freq: Every day | ORAL | Status: DC

## 2015-02-05 MED ORDER — INSULIN GLARGINE 100 UNIT/ML ~~LOC~~ SOLN: 45.0000 [IU] | Freq: Every day | SUBCUTANEOUS | Status: DC

## 2015-02-05 MED ORDER — CANAGLIFLOZIN 100 MG PO TABS: ORAL_TABLET | ORAL | Status: DC

## 2015-02-05 MED ORDER — PROPRANOLOL HCL ER 60 MG PO CP24: 60.0000 mg | ORAL_CAPSULE | Freq: Every day | ORAL | Status: DC

## 2015-02-05 NOTE — Progress Notes (Signed)
Patient ID: Debbie [name], female   DOB: 05-Mar-1961, 54 y.o.   MRN: [id number]   Debbie [name], is a 54 y.o. female  [id number]  [id number]  DOB - 06/15/1961  Chief Complaint  Patient presents with  . Follow-up        Subjective:   Debbie [name] is a 54 y.o. female here today for a follow up visit. Patient has extensive medical history as listed below including type 2 diabetes mellitus uncontrolled because of noncompliance with medications and follow-up as well as diet, hypertension, bilateral hand tremor, hypothyroidism, morbid obesity, bilateral contract and blindness. Patient is here today for routine follow-up and medication refills. Patient is present with interpreter and her sister. Her sister complaining that she fell out of her wheelchair about a month ago and has since had pain in her left hand around the wrist joint. Patient is said not to be very compliant with taking her medications, she chooses to take medication or even diabetic diet whenever she feels like and not as prescribed. She has no fever, no open wound, no swelling, no redness. Patient has No headache, No chest pain, No abdominal pain - No Nausea, No new weakness tingling or numbness, No Cough - SOB.  Problem  Chronic Pain Syndrome  Type 2 Diabetes Mellitus With Hyperglycemia  Fall At Home    ALLERGIES: Allergies  Allergen Reactions  . Hydrocortisone   . Penicillins Rash    PAST MEDICAL HISTORY: Past Medical History  Diagnosis Date  . Type II or unspecified type diabetes mellitus with unspecified complication, uncontrolled     x ~ 10 years  . Hypertension     x ~ 10 years  . Hypothyroidism   . Chest pain     a. ? MI in 2010, [place]->medically managed  . Diabetic foot ulcer   . Obesity   . Cataract   . Blind     MEDICATIONS AT HOME: Prior to Admission medications   Medication Sig Start Date End Date Taking? Authorizing Provider  acetaminophen (TYLENOL) 325 MG tablet Take 2 tablets (650 mg  total) by mouth every 6 (six) hours as needed. 01/02/13  Yes Nita Sells, MD  canagliflozin (INVOKANA) 100 MG TABS tablet TAKE ONE (1) TABLET BY MOUTH EVERY DAY 02/05/15  Yes Tresa Garter, MD  clotrimazole (LOTRIMIN) 1 % cream Apply 1 application topically 2 (two) times daily. 12/30/14  Yes Tresa Garter, MD  diazepam (VALIUM) 2 MG tablet Take 1 tablet (2 mg total) by mouth every 8 (eight) hours as needed for anxiety. 07/24/14  Yes Tresa Garter, MD  Docusate Sodium (DSS) 100 MG CAPS Take 100 mg by mouth 2 (two) times daily. Patient taking differently: Take 100 mg by mouth 2 (two) times daily.  07/24/14  Yes Tresa Garter, MD  glucose blood (ACCU-CHEK AVIVA PLUS) test strip 1 each by Other route See admin instructions. Use as instructed 01/09/15  Yes Samyria Rudie E Doreene Burke, MD  insulin aspart (NOVOLOG) 100 UNIT/ML injection Inject 0-15 Units into the skin 3 (three) times daily with meals. Patient must have appointment for future refills 01/01/15  Yes Tresa Garter, MD  insulin glargine (LANTUS) 100 UNIT/ML injection Inject 0.45 mLs (45 Units total) into the skin at bedtime. Patent must have appointment for more refills 02/05/15  Yes Tresa Garter, MD  Lancets (ACCU-CHEK SOFT TOUCH) lancets Use as instructed 01/09/15  Yes Tresa Garter, MD  lisinopril-hydrochlorothiazide (PRINZIDE,ZESTORETIC) 10-12.5 MG per tablet Take 1 tablet by mouth daily.  02/05/15  Yes Tresa Garter, MD  loteprednol (LOTEMAX) 0.5 % ophthalmic suspension Place 1 drop into both eyes 2 (two) times daily. 01/09/14  Yes Tresa Garter, MD  propranolol ER (INDERAL LA) 60 MG 24 hr capsule Take 1 capsule (60 mg total) by mouth daily. 02/05/15  Yes Tresa Garter, MD  saxagliptin HCl (ONGLYZA) 2.5 MG TABS tablet Take 2 tablets (5 mg total) by mouth daily. 02/05/15  Yes Tresa Garter, MD  senna (SENOKOT) 8.6 MG TABS Take 2 tablets (17.2 mg total) by mouth at bedtime as needed (for  constipation). 01/19/13  Yes Debbie Saralyn Pilar, MD  sertraline (ZOLOFT) 50 MG tablet Take 1 tablet (50 mg total) by mouth daily. 02/05/15  Yes Tresa Garter, MD  acetaminophen-codeine (TYLENOL #3) 300-30 MG per tablet Take 1 tablet by mouth every 4 (four) hours as needed. 02/05/15   Tresa Garter, MD     Objective:   Filed Vitals:   02/05/15 1140  Pulse: 93  Temp: 98.2 F (36.8 C)  TempSrc: Oral  Resp: 18  SpO2: 95%    Exam General appearance : Awake, alert, not in any distress. Speech Clear. Not toxic looking, wheelchair bound, bilateral hand tremor HEENT: Atraumatic and Normocephalic, pupils equally reactive to light and accomodation Neck: supple, no JVD. No cervical lymphadenopathy.  Chest:Good air entry bilaterally, no added sounds  CVS: S1 S2 regular, no murmurs.  Abdomen: Bowel sounds present, Non tender and not distended with no gaurding, rigidity or rebound. Extremities: B/L Lower Ext shows no edema, both legs are warm to touch Neurology: Awake alert, and oriented X 3, CN II-XII intact, Non focal Skin:No Rash  Data Review Lab Results  Component Value Date   HGBA1C 13.50 02/05/2015   HGBA1C >15.0 07/24/2014   HGBA1C 14.9 03/06/2014     Assessment & Plan   1. Diabetes mellitus type 2 with peripheral artery disease  - Glucose (CBG) - HgB A1c came down to 13.5% from greater than 15% few months ago - CBC with Differential/Platelet - COMPLETE METABOLIC PANEL WITH GFR - Lipid panel - TSH - Urinalysis, Complete  2. Essential hypertension:   - lisinopril-hydrochlorothiazide (PRINZIDE,ZESTORETIC) 10-12.5 MG per tablet; Take 1 tablet by mouth daily.  Dispense: 90 tablet; Refill: 3 - propranolol ER (INDERAL LA) 60 MG 24 hr capsule; Take 1 capsule (60 mg total) by mouth daily.  Dispense: 90 capsule; Refill: 3  - We have discussed target BP range and blood pressure goal - I have advised patient to check BP regularly and to call us back or report to clinic if  the numbers are consistently higher than 140/90  - We discussed the importance of compliance with medical therapy and DASH diet recommended, consequences of uncontrolled hypertension discussed.  - continue current BP medications  3. Chronic pain syndrome - acetaminophen-codeine (TYLENOL #3) 300-30 MG per tablet; Take 1 tablet by mouth every 4 (four) hours as needed.  Dispense: 60 tablet; Refill: 0  4. Type 2 diabetes mellitus with hyperglycemia  - canagliflozin (INVOKANA) 100 MG TABS tablet; TAKE ONE (1) TABLET BY MOUTH EVERY DAY  Dispense: 30 tablet; Refill: 6 - insulin glargine (LANTUS) 100 UNIT/ML injection; Inject 0.45 mLs (45 Units total) into the skin at bedtime. Patent must have appointment for more refills  Dispense: 30 mL; Refill: 3 - saxagliptin HCl (ONGLYZA) 2.5 MG TABS tablet; Take 2 tablets (5 mg total) by mouth daily.  Dispense: 180 tablet; Refill: 3  Aim for 30 minutes of  exercise most days. Rethink what you drink. Water is great! Aim for 2-3 Carb Choices per meal (30-45 grams) +/- 1 either way  Aim for 0-15 Carbs per snack if hungry  Include protein in moderation with your meals and snacks  Consider reading food labels for Total Carbohydrate and Fat Grams of foods  Consider checking BG at alternate times per day  Continue taking medication as directed Be mindful about how much sugar you are adding to beverages and other foods. Try to decrease. Consider splenda. Fruit Punch - find one with no sugar  Measure and decrease portions of carbohydrate foods  Make your plate and don't go back for seconds   5. PTSD (post-traumatic stress disorder)  - sertraline (ZOLOFT) 50 MG tablet; Take 1 tablet (50 mg total) by mouth daily.  Dispense: 90 tablet; Refill: 3  6. Fall at home, sequela  - DG Wrist Complete Left; Future   Interpreter was used to communicate directly with patient for the entire encounter including providing detailed patient instructions.   Patient have been  counseled extensively about nutrition and exercise Return in about 3 months (around 05/08/2015) for Hemoglobin A1C and Follow up, DM, Follow up HTN, Follow up Pain and comorbidities.  The patient was given clear instructions to go to ER or return to medical center if symptoms don't improve, worsen or new problems develop. The patient verbalized understanding. The patient was told to call to get lab results if they haven't heard anything in the next week.   This note has been created with Surveyor, quantity. Any transcriptional errors are unintentional.    Angelica Chessman, MD, [place], [place], [place], [place] and [place], [place]   02/05/2015, 12:20 PM

## 2015-02-05 NOTE — Patient Instructions (Signed)
DASH Eating Plan DASH stands for "Dietary Approaches to Stop Hypertension." The DASH eating plan is a healthy eating plan that has been shown to reduce high blood pressure (hypertension). Additional health benefits may include reducing the risk of type 2 diabetes mellitus, heart disease, and stroke. The DASH eating plan may also help with weight loss. WHAT DO I NEED TO KNOW ABOUT THE DASH EATING PLAN? For the DASH eating plan, you will follow these general guidelines:  Choose foods with a percent daily value for sodium of less than 5% (as listed on the food label).  Use salt-free seasonings or herbs instead of table salt or sea salt.  Check with your health care provider or pharmacist before using salt substitutes.  Eat lower-sodium products, often labeled as "lower sodium" or "no salt added."  Eat fresh foods.  Eat more vegetables, fruits, and low-fat dairy products.  Choose whole grains. Look for the word "whole" as the first word in the ingredient list.  Choose fish and skinless chicken or turkey more often than red meat. Limit fish, poultry, and meat to 6 oz (170 g) each day.  Limit sweets, desserts, sugars, and sugary drinks.  Choose heart-healthy fats.  Limit cheese to 1 oz (28 g) per day.  Eat more home-cooked food and less restaurant, buffet, and fast food.  Limit fried foods.  Cook foods using methods other than frying.  Limit canned vegetables. If you do use them, rinse them well to decrease the sodium.  When eating at a restaurant, ask that your food be prepared with less salt, or no salt if possible. WHAT FOODS CAN I EAT? Seek help from a dietitian for individual calorie needs. Grains Whole grain or whole wheat bread. Brown rice. Whole grain or whole wheat pasta. Quinoa, bulgur, and whole grain cereals. Low-sodium cereals. Corn or whole wheat flour tortillas. Whole grain cornbread. Whole grain crackers. Low-sodium crackers. Vegetables Fresh or frozen vegetables  (raw, steamed, roasted, or grilled). Low-sodium or reduced-sodium tomato and vegetable juices. Low-sodium or reduced-sodium tomato sauce and paste. Low-sodium or reduced-sodium canned vegetables.  Fruits All fresh, canned (in natural juice), or frozen fruits. Meat and Other Protein Products Ground beef (85% or leaner), grass-fed beef, or beef trimmed of fat. Skinless chicken or turkey. Ground chicken or turkey. Pork trimmed of fat. All fish and seafood. Eggs. Dried beans, peas, or lentils. Unsalted nuts and seeds. Unsalted canned beans. Dairy Low-fat dairy products, such as skim or 1% milk, 2% or reduced-fat cheeses, low-fat ricotta or cottage cheese, or plain low-fat yogurt. Low-sodium or reduced-sodium cheeses. Fats and Oils Tub margarines without trans fats. Light or reduced-fat mayonnaise and salad dressings (reduced sodium). Avocado. Safflower, olive, or canola oils. Natural peanut or almond butter. Other Unsalted popcorn and pretzels. The items listed above may not be a complete list of recommended foods or beverages. Contact your dietitian for more options. WHAT FOODS ARE NOT RECOMMENDED? Grains White bread. White pasta. White rice. Refined cornbread. Bagels and croissants. Crackers that contain trans fat. Vegetables Creamed or fried vegetables. Vegetables in a cheese sauce. Regular canned vegetables. Regular canned tomato sauce and paste. Regular tomato and vegetable juices. Fruits Dried fruits. Canned fruit in light or heavy syrup. Fruit juice. Meat and Other Protein Products Fatty cuts of meat. Ribs, chicken wings, bacon, sausage, bologna, salami, chitterlings, fatback, hot dogs, bratwurst, and packaged luncheon meats. Salted nuts and seeds. Canned beans with salt. Dairy Whole or 2% milk, cream, half-and-half, and cream cheese. Whole-fat or sweetened yogurt. Full-fat   cheeses or blue cheese. Nondairy creamers and whipped toppings. Processed cheese, cheese spreads, or cheese  curds. Condiments Onion and garlic salt, seasoned salt, table salt, and sea salt. Canned and packaged gravies. Worcestershire sauce. Tartar sauce. Barbecue sauce. Teriyaki sauce. Soy sauce, including reduced sodium. Steak sauce. Fish sauce. Oyster sauce. Cocktail sauce. Horseradish. Ketchup and mustard. Meat flavorings and tenderizers. Bouillon cubes. Hot sauce. Tabasco sauce. Marinades. Taco seasonings. Relishes. Fats and Oils Butter, stick margarine, lard, shortening, ghee, and bacon fat. Coconut, palm kernel, or palm oils. Regular salad dressings. Other Pickles and olives. Salted popcorn and pretzels. The items listed above may not be a complete list of foods and beverages to avoid. Contact your dietitian for more information. WHERE CAN I FIND MORE INFORMATION? National Heart, Lung, and Blood Institute: travelstabloid.com Document Released: 06/16/2011 Document Revised: 11/11/2013 Document Reviewed: 05/01/2013 [facility] Patient Information 2015 [place], [place]. This information is not intended to replace advice given to you by your health care provider. Make sure you discuss any questions you have with your health care provider. Diabetes and Exercise Exercising regularly is important. It is not just about losing weight. It has many health benefits, such as:  Improving your overall fitness, flexibility, and endurance.  Increasing your bone density.  Helping with weight control.  Decreasing your body fat.  Increasing your muscle strength.  Reducing stress and tension.  Improving your overall health. People with diabetes who exercise gain additional benefits because exercise:  Reduces appetite.  Improves the body's use of blood sugar (glucose).  Helps lower or control blood glucose.  Decreases blood pressure.  Helps control blood lipids (such as cholesterol and triglycerides).  Improves the body's use of the hormone insulin by:  Increasing the  body's insulin sensitivity.  Reducing the body's insulin needs.  Decreases the risk for heart disease because exercising:  Lowers cholesterol and triglycerides levels.  Increases the levels of good cholesterol (such as high-density lipoproteins [HDL]) in the body.  Lowers blood glucose levels. YOUR ACTIVITY PLAN  Choose an activity that you enjoy and set realistic goals. Your health care provider or diabetes educator can help you make an activity plan that works for you. Exercise regularly as directed by your health care provider. This includes:  Performing resistance training twice a week such as push-ups, sit-ups, lifting weights, or using resistance bands.  Performing 150 minutes of cardio exercises each week such as walking, running, or playing sports.  Staying active and spending no more than 90 minutes at one time being inactive. Even short bursts of exercise are good for you. Three 10-minute sessions spread throughout the day are just as beneficial as a single 30-minute session. Some exercise ideas include:  Taking the dog for a walk.  Taking the stairs instead of the elevator.  Dancing to your favorite song.  Doing an exercise video.  Doing your favorite exercise with a friend. RECOMMENDATIONS FOR EXERCISING WITH TYPE 1 OR TYPE 2 DIABETES   Check your blood glucose before exercising. If blood glucose levels are greater than 240 mg/dL, check for urine ketones. Do not exercise if ketones are present.  Avoid injecting insulin into areas of the body that are going to be exercised. For example, avoid injecting insulin into:  The arms when playing tennis.  The legs when jogging.  Keep a record of:  Food intake before and after you exercise.  Expected peak times of insulin action.  Blood glucose levels before and after you exercise.  The type and amount of exercise  you have done.  Review your records with your health care provider. Your health care provider will  help you to develop guidelines for adjusting food intake and insulin amounts before and after exercising.  If you take insulin or oral hypoglycemic agents, watch for signs and symptoms of hypoglycemia. They include:  Dizziness.  Shaking.  Sweating.  Chills.  Confusion.  Drink plenty of water while you exercise to prevent dehydration or heat stroke. Body water is lost during exercise and must be replaced.  Talk to your health care provider before starting an exercise program to make sure it is safe for you. Remember, almost any type of activity is better than none. Document Released: 09/17/2003 Document Revised: 11/11/2013 Document Reviewed: 12/04/2012 [facility] Patient Information 2015 [place], [place]. This information is not intended to replace advice given to you by your health care provider. Make sure you discuss any questions you have with your health care provider. Basic Carbohydrate Counting for Diabetes Mellitus Carbohydrate counting is a method for keeping track of the amount of carbohydrates you eat. Eating carbohydrates naturally increases the level of sugar (glucose) in your blood, so it is important for you to know the amount that is okay for you to have in every meal. Carbohydrate counting helps keep the level of glucose in your blood within normal limits. The amount of carbohydrates allowed is different for every person. A dietitian can help you calculate the amount that is right for you. Once you know the amount of carbohydrates you can have, you can count the carbohydrates in the foods you want to eat. Carbohydrates are found in the following foods:  Grains, such as breads and cereals.  Dried beans and soy products.  Starchy vegetables, such as potatoes, peas, and corn.  Fruit and fruit juices.  Milk and yogurt.  Sweets and snack foods, such as cake, cookies, candy, chips, soft drinks, and fruit drinks. CARBOHYDRATE COUNTING There are two ways to count the  carbohydrates in your food. You can use either of the methods or a combination of both. Reading the "Nutrition Facts" on [place] The "Nutrition Facts" is an area that is included on the labels of almost all packaged food and beverages in the [place]. It includes the serving size of that food or beverage and information about the nutrients in each serving of the food, including the grams (g) of carbohydrate per serving.  Decide the number of servings of this food or beverage that you will be able to eat or drink. Multiply that number of servings by the number of grams of carbohydrate that is listed on the label for that serving. The total will be the amount of carbohydrates you will be having when you eat or drink this food or beverage. Learning Standard Serving Sizes of Food When you eat food that is not packaged or does not include "Nutrition Facts" on the label, you need to measure the servings in order to count the amount of carbohydrates.A serving of most carbohydrate-rich foods contains about 15 g of carbohydrates. The following list includes serving sizes of carbohydrate-rich foods that provide 15 g ofcarbohydrate per serving:   1 slice of bread (1 oz) or 1 six-inch tortilla.    of a hamburger bun or English muffin.  4-6 crackers.   cup unsweetened dry cereal.    cup hot cereal.   cup rice or pasta.    cup mashed potatoes or  of a large baked potato.  1 cup fresh fruit or one  small piece of fruit.    cup canned or frozen fruit or fruit juice.  1 cup milk.   cup plain fat-free yogurt or yogurt sweetened with artificial sweeteners.   cup cooked dried beans or starchy vegetable, such as peas, corn, or potatoes.  Decide the number of standard-size servings that you will eat. Multiply that number of servings by 15 (the grams of carbohydrates in that serving). For example, if you eat 2 cups of strawberries, you will have eaten 2 servings and 30 g of  carbohydrates (2 servings x 15 g = 30 g). For foods such as soups and casseroles, in which more than one food is mixed in, you will need to count the carbohydrates in each food that is included. EXAMPLE OF CARBOHYDRATE COUNTING Sample Dinner  3 oz chicken breast.   cup of brown rice.   cup of corn.  1 cup milk.   1 cup strawberries with sugar-free whipped topping.  Carbohydrate Calculation Step 1: Identify the foods that contain carbohydrates:   Rice.   Corn.   Milk.   Strawberries. Step 2:Calculate the number of servings eaten of each:   2 servings of rice.   1 serving of corn.   1 serving of milk.   1 serving of strawberries. Step 3: Multiply each of those number of servings by 15 g:   2 servings of rice x 15 g = 30 g.   1 serving of corn x 15 g = 15 g.   1 serving of milk x 15 g = 15 g.   1 serving of strawberries x 15 g = 15 g. Step 4: Add together all of the amounts to find the total grams of carbohydrates eaten: 30 g + 15 g + 15 g + 15 g = 75 g. Document Released: 06/27/2005 Document Revised: 11/11/2013 Document Reviewed: 05/24/2013 [facility] Patient Information 2015 [place], [place]. This information is not intended to replace advice given to you by your health care provider. Make sure you discuss any questions you have with your health care provider.

## 2015-02-05 NOTE — Progress Notes (Signed)
Patient here for 6 month follow up. Patient reports pain in lower back and left hand due to a fall from her bed. Pain rated at an 8, described as stabbing. Pain comes and go. Pain has been present for a month now. Patient CBG is 431 and A1C is 13.5. Patient has taken onglyza and 35 units of insulin today. Patient needs refills on all medications except stool softeners.

## 2015-02-06 LAB — LIPID PANEL
CHOL/HDL RATIO: 3.7 ratio (ref <=5.0)
CHOLESTEROL: 209 mg/dL — AB (ref 125–200)
HDL: 57 mg/dL (ref >=46)
LDL Cholesterol: 117 mg/dL (ref <130)
TRIGLYCERIDES: 174 mg/dL — AB (ref <150)
VLDL: 35 mg/dL — ABNORMAL HIGH (ref <30)

## 2015-02-06 LAB — COMPLETE METABOLIC PANEL WITH GFR
ALBUMIN: 4 g/dL (ref 3.6–5.1)
ALT: 30 U/L — ABNORMAL HIGH (ref 6–29)
AST: 16 U/L (ref 10–35)
Alkaline Phosphatase: 138 U/L — ABNORMAL HIGH (ref 33–130)
BUN: 19 mg/dL (ref 7–25)
CALCIUM: 9.7 mg/dL (ref 8.6–10.4)
CO2: 24 meq/L (ref 20–31)
Chloride: 103 mEq/L (ref 98–110)
Creat: 0.62 mg/dL (ref 0.50–1.05)
GFR, Est Non African American: >89 mL/min (ref >=60)
GLUCOSE: 402 mg/dL — AB (ref 65–99)
Potassium: 4.6 mEq/L (ref 3.5–5.3)
Sodium: 139 mEq/L (ref 135–146)
TOTAL PROTEIN: 6.8 g/dL (ref 6.1–8.1)
Total Bilirubin: 0.5 mg/dL (ref 0.2–1.2)

## 2015-02-06 LAB — TSH: TSH: 1.19 u[IU]/mL (ref 0.350–4.500)

## 2015-02-26 ENCOUNTER — Telehealth: Payer: Self-pay | Admitting: *Deleted

## 2015-02-26 NOTE — Telephone Encounter (Signed)
Spoke to [employer] who has been authorized to receive information on patient's behalf.  Gave her the results of the negative xray.  She will let patient know.

## 2015-02-26 NOTE — Telephone Encounter (Signed)
-----   Message from Nila Nephew, RN sent at 02/20/2015  3:07 PM EDT -----   ----- Message -----    From: Tresa Garter, MD    Sent: 02/11/2015  11:57 AM      To: Nila Nephew, RN  Please informed patient that her x-ray shows no evidence of fracture or bony abnormality

## 2015-03-11 ENCOUNTER — Other Ambulatory Visit: Payer: Self-pay | Admitting: Internal Medicine

## 2015-04-01 ENCOUNTER — Other Ambulatory Visit: Payer: Self-pay | Admitting: *Deleted

## 2015-04-01 DIAGNOSIS — E1165 Type 2 diabetes mellitus with hyperglycemia: Secondary | ICD-10-CM

## 2015-04-01 MED ORDER — GLUCOSE BLOOD VI STRP: 1.0000 | ORAL_STRIP | Status: DC

## 2015-04-01 NOTE — Telephone Encounter (Signed)
[facility] pharmacy in [place] called to get verbal order for test strips-verbal order given to

## 2015-04-03 ENCOUNTER — Ambulatory Visit: Payer: Self-pay | Admitting: Family Medicine

## 2015-04-13 ENCOUNTER — Encounter: Payer: Self-pay | Admitting: Internal Medicine

## 2015-04-13 ENCOUNTER — Ambulatory Visit: Payer: Medicaid Other | Attending: Family Medicine | Admitting: Internal Medicine

## 2015-04-13 VITALS — BP 128/77 | HR 83 | Temp 97.4°F | Resp 18 | Ht 65.0 in

## 2015-04-13 DIAGNOSIS — L089 Local infection of the skin and subcutaneous tissue, unspecified: Secondary | ICD-10-CM | POA: Diagnosis not present

## 2015-04-13 DIAGNOSIS — Z794 Long term (current) use of insulin: Secondary | ICD-10-CM | POA: Diagnosis not present

## 2015-04-13 DIAGNOSIS — E162 Hypoglycemia, unspecified: Secondary | ICD-10-CM | POA: Insufficient documentation

## 2015-04-13 DIAGNOSIS — E118 Type 2 diabetes mellitus with unspecified complications: Secondary | ICD-10-CM | POA: Diagnosis not present

## 2015-04-13 DIAGNOSIS — G894 Chronic pain syndrome: Secondary | ICD-10-CM | POA: Insufficient documentation

## 2015-04-13 DIAGNOSIS — E11628 Type 2 diabetes mellitus with other skin complications: Secondary | ICD-10-CM

## 2015-04-13 DIAGNOSIS — I10 Essential (primary) hypertension: Secondary | ICD-10-CM | POA: Diagnosis not present

## 2015-04-13 DIAGNOSIS — E1169 Type 2 diabetes mellitus with other specified complication: Secondary | ICD-10-CM | POA: Diagnosis not present

## 2015-04-13 LAB — GLUCOSE, POCT (MANUAL RESULT ENTRY): POC Glucose: 46 mg/dl — AB (ref 70–99)

## 2015-04-13 MED ORDER — ACETAMINOPHEN-CODEINE #3 300-30 MG PO TABS: 1.0000 | ORAL_TABLET | ORAL | Status: DC | PRN

## 2015-04-13 MED ORDER — CIPROFLOXACIN HCL 500 MG PO TABS: 500.0000 mg | ORAL_TABLET | Freq: Two times a day (BID) | ORAL | Status: DC

## 2015-04-13 MED ORDER — DOXYCYCLINE HYCLATE 100 MG PO TABS: 100.0000 mg | ORAL_TABLET | Freq: Two times a day (BID) | ORAL | Status: DC

## 2015-04-13 MED ORDER — GLUCOSE 40 % PO GEL
1.0000 | Freq: Once | ORAL | Status: AC
  Administered 2015-04-13: 37.5 g via ORAL

## 2015-04-13 MED ORDER — GLUCOSE 40 % PO GEL: 1.0000 | Freq: Once | ORAL | Status: DC

## 2015-04-13 NOTE — Progress Notes (Signed)
Patient here for Toe Infection. Patient complains of pain in her back and her foot. Scaled at a 6/7, described as a sharp throbbing pain intermittently. Patient has experienced back pain for more than a year. Patient experienced toe pain since surgery on toe.  Sister of Patient explains: Patient had toe surgery and her blood sugar went down to a dangerous level. Sister is concerned of Diabetic control. Sister states patients sugar is constantly dropping to low concerning levels. Patient was seen 3 times in ER.   Patient has taken medications this morning. CBG-46

## 2015-04-13 NOTE — Progress Notes (Signed)
Patient ID: Debbie [name], female   DOB: 09/23/1960, 54 y.o.   MRN: [id number]   Debbie [name], is a 54 y.o. female  WER:[id number]  [id number]:[id number]  DOB - 1960/08/03  Chief Complaint  Patient presents with  . Toe Pain        Subjective:   Debbie [name] is a 54 y.o. female here today for a follow up visit. Patient has history of hypertension, uncontrolled diabetes mellitus due to noncompliance, blindness from bilateral cataract, recent right small toe amputation done at a different hospital in [place]. Patient is here today for follow-up of her diabetic management. While in the hospital patient was on sliding scale and was discharged home on 30 units of Lantus insulin, NovoLog 15 units 3 times a day and continue other oral hypoglycemic agents. Since discharge patient has been experiencing hypoglycemic episodes especially in the morning, blood sugar as low as 42 has been recorded by patients. There was no syncopal attack at any time. There is no fever. Patient attends wound clinic for dressing of wound 3 times a week. She follows up with orthopedic surgery at [facility]. She does not need refill on her medications today. Patient has No headache, No chest pain, No abdominal pain - No Nausea, No new weakness tingling or numbness, No Cough - SOB.  Problem  Hypoglycemia    ALLERGIES: Allergies  Allergen Reactions  . Hydrocortisone   . Penicillins Rash    PAST MEDICAL HISTORY: Past Medical History  Diagnosis Date  . Type II or unspecified type diabetes mellitus with unspecified complication, uncontrolled     x ~ 10 years  . Hypertension     x ~ 10 years  . Hypothyroidism   . Chest pain     a. ? MI in 2010, [place]->medically managed  . Diabetic foot ulcer ([place])   . Obesity   . Cataract   . Blind     MEDICATIONS AT HOME: Prior to Admission medications   Medication Sig Start Date End Date Taking? Authorizing Provider  acetaminophen (TYLENOL) 325 MG tablet Take 2 tablets (650 mg  total) by mouth every 6 (six) hours as needed. 01/02/13  Yes Nita Sells, MD  acetaminophen-codeine (TYLENOL #3) 300-30 MG tablet Take 1 tablet by mouth every 4 (four) hours as needed. 04/13/15  Yes Tresa Garter, MD  canagliflozin (INVOKANA) 100 MG TABS tablet TAKE ONE (1) TABLET BY MOUTH EVERY DAY 02/05/15  Yes Tresa Garter, MD  clotrimazole (LOTRIMIN) 1 % cream Apply 1 application topically 2 (two) times daily. 12/30/14  Yes Tresa Garter, MD  diazepam (VALIUM) 2 MG tablet Take 1 tablet (2 mg total) by mouth every 8 (eight) hours as needed for anxiety. 07/24/14  Yes Tresa Garter, MD  glucose blood (ACCU-CHEK AVIVA PLUS) test strip 1 each by Other route See admin instructions. Use as instructed 04/01/15  Yes Zenora Karpel E Doreene Burke, MD  insulin aspart (NOVOLOG) 100 UNIT/ML injection Inject 0-15 Units into the skin 3 (three) times daily with meals. Patient must have appointment for future refills 01/01/15  Yes Tresa Garter, MD  insulin glargine (LANTUS) 100 UNIT/ML injection Inject 0.45 mLs (45 Units total) into the skin at bedtime. Patent must have appointment for more refills 02/05/15  Yes Tresa Garter, MD  Lancets (ACCU-CHEK SOFT TOUCH) lancets Use as instructed 01/09/15  Yes Tresa Garter, MD  lisinopril-hydrochlorothiazide (PRINZIDE,ZESTORETIC) 10-12.5 MG per tablet Take 1 tablet by mouth daily. 02/05/15  Yes Tresa Garter, MD  propranolol ER (  INDERAL LA) 60 MG 24 hr capsule Take 1 capsule (60 mg total) by mouth daily. 02/05/15  Yes Tresa Garter, MD  saxagliptin HCl (ONGLYZA) 2.5 MG TABS tablet Take 2 tablets (5 mg total) by mouth daily. 02/05/15  Yes Tresa Garter, MD  senna (SENOKOT) 8.6 MG TABS Take 2 tablets (17.2 mg total) by mouth at bedtime as needed (for constipation). 01/19/13  Yes Adeline Saralyn Pilar, MD  sertraline (ZOLOFT) 50 MG tablet Take 1 tablet (50 mg total) by mouth daily. 02/05/15  Yes Tresa Garter, MD  ciprofloxacin  (CIPRO) 500 MG tablet Take 1 tablet (500 mg total) by mouth 2 (two) times daily. 04/13/15   Tresa Garter, MD  Docusate Sodium (DSS) 100 MG CAPS Take 100 mg by mouth 2 (two) times daily. Patient not taking: Reported on 04/13/2015 07/24/14   Tresa Garter, MD  doxycycline (VIBRA-TABS) 100 MG tablet Take 1 tablet (100 mg total) by mouth 2 (two) times daily. 04/13/15   Tresa Garter, MD  loteprednol (LOTEMAX) 0.5 % ophthalmic suspension Place 1 drop into both eyes 2 (two) times daily. 01/09/14   Tresa Garter, MD     Objective:   Filed Vitals:   04/13/15 1103  BP: 128/77  Pulse: 83  Temp: 97.4 F (36.3 C)  TempSrc: Oral  Resp: 18  Height: 5\' 5"  (1.651 m)  SpO2: 95%    Exam General appearance : Awake, alert, not in any distress. Speech Clear. Not toxic looking, in a wheelchair HEENT: Atraumatic and Normocephalic, pupils equally reactive to light and accomodation, legally blind Neck: supple, no JVD. No cervical lymphadenopathy.  Chest:Good air entry bilaterally, no added sounds  CVS: S1 S2 regular, no murmurs.  Abdomen: Bowel sounds present, Non tender and not distended with no gaurding, rigidity or rebound. Neurology: Awake alert, and oriented X 3, CN II-XII intact, Non focal Right small toe amputation with excoriated wound base, slightly foul smelling and dark/black eschar around the ulcer   Data Review Lab Results  Component Value Date   HGBA1C 13.50 02/05/2015   HGBA1C >15.0 07/24/2014   HGBA1C 14.9 03/06/2014     Assessment & Plan   1. Type 2 diabetes mellitus with complication, with long-term current use of insulin (HCC)  - Glucose (CBG)  Aim for 30 minutes of exercise most days. Rethink what you drink. Water is great! Aim for 2-3 Carb Choices per meal (30-45 grams) +/- 1 either way  Aim for 0-15 Carbs per snack if hungry  Include protein in moderation with your meals and snacks  Consider reading food labels for Total Carbohydrate and Fat Grams  of foods  Consider checking BG at alternate times per day  Continue taking medication as directed Be mindful about how [name] sugar you are adding to beverages and other foods. Fruit Punch - find one with no sugar  Measure and decrease portions of carbohydrate foods  Make your plate and don't go back for seconds   2. Hypoglycemia  - dextrose (GLUTOSE) 40 % oral gel 37.5 g; Take 37.5 g by mouth once. - Reduce Lantus Insulin to 20 units QHS - Reduce Novolog Insulin to 10 units tid - Check BG 3 x daily, bring logs to clinic in 2 weeks - Hypoglycemic precautions emphasized  3. Essential hypertension  We have discussed target BP range and blood pressure goal. I have advised patient to check BP regularly and to call us back or report to clinic if the numbers are consistently  higher than 140/90. We discussed the importance of compliance with medical therapy and DASH diet recommended, consequences of uncontrolled hypertension discussed.  - continue current BP medications  4. Chronic pain syndrome  - acetaminophen-codeine (TYLENOL #3) 300-30 MG tablet; Take 1 tablet by mouth every 4 (four) hours as needed.  Dispense: 60 tablet; Refill: 0  5. Diabetic foot infection (HCC)  - ciprofloxacin (CIPRO) 500 MG tablet; Take 1 tablet (500 mg total) by mouth 2 (two) times daily.  Dispense: 20 tablet; Refill: 0 - doxycycline (VIBRA-TABS) 100 MG tablet; Take 1 tablet (100 mg total) by mouth 2 (two) times daily.  Dispense: 20 tablet; Refill: 0 - Follow up with orthopedic surgery and podiatrist on 10/05/201 as scheduled  Patient have been counseled extensively about nutrition and exercise Interpreter was used to communicate directly with patient for the entire encounter including providing detailed patient instructions.   Return in about 2 weeks (around 04/27/2015) for CBG, Lab/Nurse Visit, wound check.  The patient was given clear instructions to go to ER or return to medical center if symptoms don't  improve, worsen or new problems develop. The patient verbalized understanding. The patient was told to call to get lab results if they haven't heard anything in the next week.   This note has been created with Surveyor, quantity. Any transcriptional errors are unintentional.    Angelica Chessman, MD, [place], [place], [place], [place] and [place], [place]   04/13/2015, 12:03 PM

## 2015-04-13 NOTE — Patient Instructions (Signed)
Diabetes and Foot Care Diabetes may cause you to have problems because of poor blood supply (circulation) to your feet and legs. This may cause the skin on your feet to become thinner, break easier, and heal more slowly. Your skin may become dry, and the skin may peel and crack. You may also have nerve damage in your legs and feet causing decreased feeling in them. You may not notice minor injuries to your feet that could lead to infections or more serious problems. Taking care of your feet is one of the most important things you can do for yourself.  HOME CARE INSTRUCTIONS  Wear shoes at all times, even in the house. Do not go barefoot. Bare feet are easily injured.  Check your feet daily for blisters, cuts, and redness. If you cannot see the bottom of your feet, use a mirror or ask someone for help.  Wash your feet with warm water (do not use hot water) and mild soap. Then pat your feet and the areas between your toes until they are completely dry. Do not soak your feet as this can dry your skin.  Apply a moisturizing lotion or petroleum jelly (that does not contain alcohol and is unscented) to the skin on your feet and to dry, brittle toenails. Do not apply lotion between your toes.  Trim your toenails straight across. Do not dig under them or around the cuticle. File the edges of your nails with an emery board or nail file.  Do not cut corns or calluses or try to remove them with medicine.  Wear clean socks or stockings every day. Make sure they are not too tight. Do not wear knee-high stockings since they may decrease blood flow to your legs.  Wear shoes that fit properly and have enough cushioning. To break in new shoes, wear them for just a few hours a day. This prevents you from injuring your feet. Always look in your shoes before you put them on to be sure there are no objects inside.  Do not cross your legs. This may decrease the blood flow to your feet.  If you find a minor scrape,  cut, or break in the skin on your feet, keep it and the skin around it clean and dry. These areas may be cleansed with mild soap and water. Do not cleanse the area with peroxide, alcohol, or iodine.  When you remove an adhesive bandage, be sure not to damage the skin around it.  If you have a wound, look at it several times a day to make sure it is healing.  Do not use heating pads or hot water bottles. They may burn your skin. If you have lost feeling in your feet or legs, you may not know it is happening until it is too late.  Make sure your health care provider performs a complete foot exam at least annually or more often if you have foot problems. Report any cuts, sores, or bruises to your health care provider immediately. SEEK MEDICAL CARE IF:   You have an injury that is not healing.  You have cuts or breaks in the skin.  You have an ingrown nail.  You notice redness on your legs or feet.  You feel burning or tingling in your legs or feet.  You have pain or cramps in your legs and feet.  Your legs or feet are numb.  Your feet always feel cold. SEEK IMMEDIATE MEDICAL CARE IF:   There is increasing redness,   swelling, or pain in or around a wound.  There is a red line that goes up your leg.  Pus is coming from a wound.  You develop a fever or as directed by your health care provider.  You notice a bad smell coming from an ulcer or wound. Document Released: 06/24/2000 Document Revised: 02/27/2013 Document Reviewed: 12/04/2012 ExitCare Patient Information 2015 [employer]. This information is not intended to replace advice given to you by your health care provider. Make sure you discuss any questions you have with your health care provider.  

## 2015-04-28 ENCOUNTER — Ambulatory Visit: Payer: Medicaid Other | Attending: Internal Medicine | Admitting: Pharmacist

## 2015-04-28 ENCOUNTER — Ambulatory Visit: Payer: Self-pay

## 2015-04-28 ENCOUNTER — Ambulatory Visit: Payer: Self-pay | Admitting: Pharmacist

## 2015-04-28 DIAGNOSIS — Z794 Long term (current) use of insulin: Secondary | ICD-10-CM | POA: Diagnosis not present

## 2015-04-28 DIAGNOSIS — E118 Type 2 diabetes mellitus with unspecified complications: Secondary | ICD-10-CM | POA: Diagnosis not present

## 2015-04-28 DIAGNOSIS — Z23 Encounter for immunization: Secondary | ICD-10-CM

## 2015-04-28 MED ORDER — INFLUENZA VAC SPLIT QUAD 0.5 ML IM SUSY
0.5000 mL | PREFILLED_SYRINGE | Freq: Once | INTRAMUSCULAR | Status: AC
  Administered 2015-04-28: 0.5 mL via INTRAMUSCULAR

## 2015-04-28 NOTE — Patient Instructions (Signed)

## 2015-04-28 NOTE — Progress Notes (Signed)
S:    Patient arrives with sister.  Presents for diabetes follow up. [place] was used for the visit.   Patient reports adherence with medications. Current diabetes medications include Lantus 20 units nightly and Novolog 10 units TID.   Patient reports hypoglycemic events. She had 2-3 hypoglycemic events since her visit with Dr. Doreene Burke. They occur later in the evening and they are treated with glucose tablets by her sister. Her sister reports that she receives 24-hour care now.     O:  Lab Results  Component Value Date   HGBA1C 13.50 02/05/2015    Home fasting CBG: 80-140s 2 hour post-prandial/random CBG: <300s, mostly 100s, hypoglycemic events occur late in the evening.  A/P: Diabetes currently uncontrolled based on A1c of 13.5 but under improved control based on home CBGs and less hypoglycemic events - especially with none in the last 1.5 weeks.   Patient reports hypoglycemic events and is able to verbalize appropriate hypoglycemia management plan.  Patient reports adherence with medication. Control is suboptimal due to sedentary lifestyle.   I will not make any changes at this time. Patient was not scheduled to see me but I am covering for nurse visit for CBGs. I think that patient could benefit from further diabetic education. Hypoglycemia is an issue but it appears to be improving. She has not had any in the last 1.5 weeks so I do not want to adjust her insulin unless she continues to have issues. Would likely need to back off on the evening dose of Novolog as that is where her lows occur if she does not eat enough.  Her overall numbers are much better and mostly in the 100s.   Patient's sister was adamant that patient be seen for wound check as she believes the wound was worsening. I am unable to do wound checks so took patient up front to be scheduled for wound check.   Next A1C anticipated November 2016.    Written patient instructions provided.  Total  time in face to face counseling 20 minutes.   Follow up in Pharmacist Clinic Visit as needed - next visit with provider for wound check.

## 2015-04-30 ENCOUNTER — Telehealth: Payer: Self-pay | Admitting: Internal Medicine

## 2015-04-30 NOTE — Telephone Encounter (Signed)
Patients sister called and requested a call back from PCP. Please f/u

## 2015-05-01 ENCOUNTER — Encounter (HOSPITAL_COMMUNITY): Payer: Self-pay | Admitting: Emergency Medicine

## 2015-05-01 ENCOUNTER — Emergency Department (INDEPENDENT_AMBULATORY_CARE_PROVIDER_SITE_OTHER)
Admission: EM | Admit: 2015-05-01 | Discharge: 2015-05-01 | Disposition: A | Discharge status: Home / Self Care | Payer: Medicaid Other | Source: Home / Self Care | Attending: Family Medicine | Admitting: Family Medicine

## 2015-05-01 DIAGNOSIS — Z5189 Encounter for other specified aftercare: Secondary | ICD-10-CM

## 2015-05-01 DIAGNOSIS — E1059 Type 1 diabetes mellitus with other circulatory complications: Secondary | ICD-10-CM | POA: Diagnosis not present

## 2015-05-01 DIAGNOSIS — Z89421 Acquired absence of other right toe(s): Secondary | ICD-10-CM | POA: Diagnosis not present

## 2015-05-01 NOTE — ED Provider Notes (Signed)
CSN: [id number]     Arrival date & time 05/01/15  1411 History   First MD Initiated Contact with Patient 05/01/15 1549     Chief Complaint  Patient presents with  . Post-op Problem   (Consider location/radiation/quality/duration/timing/severity/associated sxs/prior Treatment) HPI Comments: 54 year old female is accompanied by her sister. The patient has a history of type 2 diabetes mellitus poorly controlled and as such has diabetic ulcers to the right foot. She has already lost the fifth toe and the third and fourth digit are necrotic. They have been traveling to [place] daily for dressing changes. She has been treated with doxycycline and Cipro recently. She is chest finished a course his of his medications. 4 some reason we are unable to ascertain she comes here today for wound check and dressing change.    Past Medical History  Diagnosis Date  . Type II or unspecified type diabetes mellitus with unspecified complication, uncontrolled     x ~ 10 years  . Hypertension     x ~ 10 years  . Hypothyroidism   . Chest pain     a. ? MI in 2010, [place]->medically managed  . Diabetic foot ulcer ([place])   . Obesity   . Cataract   . Blind    Past Surgical History  Procedure Laterality Date  . Back surgery    . Tubal ligation     Family History  Problem Relation Age of Onset  . Diabetes Father     died in his 73's?  . Diabetes Father     died in her 84's?  . Diabetes Sister     alive and well.   Social History  Substance Use Topics  . Smoking status: Never Smoker   . Smokeless tobacco: None  . Alcohol Use: No   OB History    No data available     Review of Systems  Constitutional: Positive for fatigue. Negative for fever and appetite change.  Respiratory: Negative.   Cardiovascular: Negative.   Musculoskeletal: Negative.   Skin: Positive for wound.  Neurological: Positive for numbness.  Psychiatric/Behavioral: Negative.     Allergies  Hydrocortisone and  Penicillins  Home Medications   Prior to Admission medications   Medication Sig Start Date End Date Taking? Authorizing Provider  acetaminophen (TYLENOL) 325 MG tablet Take 2 tablets (650 mg total) by mouth every 6 (six) hours as needed. 01/02/13   Nita Sells, MD  acetaminophen-codeine (TYLENOL #3) 300-30 MG tablet Take 1 tablet by mouth every 4 (four) hours as needed. 04/13/15   Tresa Garter, MD  canagliflozin (INVOKANA) 100 MG TABS tablet TAKE ONE (1) TABLET BY MOUTH EVERY DAY 02/05/15   Tresa Garter, MD  ciprofloxacin (CIPRO) 500 MG tablet Take 1 tablet (500 mg total) by mouth 2 (two) times daily. 04/13/15   Tresa Garter, MD  clotrimazole (LOTRIMIN) 1 % cream Apply 1 application topically 2 (two) times daily. 12/30/14   Tresa Garter, MD  diazepam (VALIUM) 2 MG tablet Take 1 tablet (2 mg total) by mouth every 8 (eight) hours as needed for anxiety. 07/24/14   Tresa Garter, MD  Docusate Sodium (DSS) 100 MG CAPS Take 100 mg by mouth 2 (two) times daily. Patient not taking: Reported on 04/13/2015 07/24/14   Tresa Garter, MD  doxycycline (VIBRA-TABS) 100 MG tablet Take 1 tablet (100 mg total) by mouth 2 (two) times daily. 04/13/15   Tresa Garter, MD  glucose blood (ACCU-CHEK AVIVA PLUS) test strip  1 each by Other route See admin instructions. Use as instructed 04/01/15   Tresa Garter, MD  insulin aspart (NOVOLOG) 100 UNIT/ML injection Inject 0-15 Units into the skin 3 (three) times daily with meals. Patient must have appointment for future refills 01/01/15   Tresa Garter, MD  insulin glargine (LANTUS) 100 UNIT/ML injection Inject 0.45 mLs (45 Units total) into the skin at bedtime. Patent must have appointment for more refills 02/05/15   Tresa Garter, MD  Lancets (ACCU-CHEK SOFT TOUCH) lancets Use as instructed 01/09/15   Tresa Garter, MD  lisinopril-hydrochlorothiazide (PRINZIDE,ZESTORETIC) 10-12.5 MG per tablet Take 1 tablet by  mouth daily. 02/05/15   Tresa Garter, MD  loteprednol (LOTEMAX) 0.5 % ophthalmic suspension Place 1 drop into both eyes 2 (two) times daily. 01/09/14   Tresa Garter, MD  propranolol ER (INDERAL LA) 60 MG 24 hr capsule Take 1 capsule (60 mg total) by mouth daily. 02/05/15   Tresa Garter, MD  saxagliptin HCl (ONGLYZA) 2.5 MG TABS tablet Take 2 tablets (5 mg total) by mouth daily. 02/05/15   Tresa Garter, MD  senna (SENOKOT) 8.6 MG TABS Take 2 tablets (17.2 mg total) by mouth at bedtime as needed (for constipation). 01/19/13   Sheila Oats, MD  sertraline (ZOLOFT) 50 MG tablet Take 1 tablet (50 mg total) by mouth daily. 02/05/15   Tresa Garter, MD   Meds Ordered and Administered this Visit  Medications - No data to display  BP 141/74 mmHg  Pulse 80  Temp(Src) 97.9 F (36.6 C) (Oral)  SpO2 95%  LMP 05/21/2013 No data found.   Physical Exam  Constitutional: She appears well-developed and well-nourished. No distress.  Cardiovascular: Normal rate.   Pulmonary/Chest: Effort normal. No respiratory distress.  Musculoskeletal:  Patient is unable to ambulate. She is unable to can onto the exam table. She is sitting in a well chair. She is not tested for range of motion or strength.  Neurological: She is alert. She exhibits normal muscle tone.  Skin: Skin is warm and dry.  The right foot wound dressing was removed. The right fifth digit has been surgically removed. The wound is healing by secondary intention. The adjacent 2 toes are necrotic. There is no current evidence of acute infection. There is no lymphangitis or cellulitis. There is no purulence or exudates other than physiologic oozing from the wound.  Nursing note and vitals reviewed.   ED Course  Procedures (including critical care time)  Labs Review Labs Reviewed - No data to display  Imaging Review No results found.   Visual Acuity Review  Right Eye Distance:   Left Eye Distance:   Bilateral  Distance:    Right Eye Near:   Left Eye Near:    Bilateral Near:         MDM   1. Visit for wound check   2. History of amputation of lesser toe, right ([place])   3. Type 1 diabetes mellitus with other circulatory complication ([place])    Wound care as ordered. New dressing F/U with wound care center    Janne Napoleon, NP 05/01/15 1655

## 2015-05-01 NOTE — Discharge Instructions (Signed)
Wound Care °Taking care of your wound properly can help to prevent pain and infection. It can also help your wound to heal more quickly.  °HOW TO CARE FOR YOUR WOUND  °· Take or apply over-the-counter and prescription medicines only as told by your health care provider. °· If you were prescribed antibiotic medicine, take or apply it as told by your health care provider. Do not stop using the antibiotic even if your condition improves. °· Clean the wound each day or as told by your health care provider. °¨ Wash the wound with mild soap and water. °¨ Rinse the wound with water to remove all soap. °¨ Pat the wound dry with a clean towel. Do not rub it. °· There are many different ways to close and cover a wound. For example, a wound can be covered with stitches (sutures), skin glue, or adhesive strips. Follow instructions from your health care provider about: °¨ How to take care of your wound. °¨ When and how you should change your bandage (dressing). °¨ When you should remove your dressing. °¨ Removing whatever was used to close your wound. °· Check your wound every day for signs of infection. Watch for: °¨ Redness, swelling, or pain. °¨ Fluid, blood, or pus. °· Keep the dressing dry until your health care provider says it can be removed. Do not take baths, swim, use a hot tub, or do anything that would put your wound underwater until your health care provider approves. °· Raise (elevate) the injured area above the level of your heart while you are sitting or lying down. °· Do not scratch or pick at the wound. °· Keep all follow-up visits as told by your health care provider. This is important. °SEEK MEDICAL CARE IF: °· You received a tetanus shot and you have swelling, severe pain, redness, or bleeding at the injection site. °· You have a fever. °· Your pain is not controlled with medicine. °· You have increased redness, swelling, or pain at the site of your wound. °· You have fluid, blood, or pus coming from your  wound. °· You notice a bad smell coming from your wound or your dressing. °SEEK IMMEDIATE MEDICAL CARE IF: °· You have a red streak going away from your wound. °  °This information is not intended to replace advice given to you by your health care provider. Make sure you discuss any questions you have with your health care provider. °  °Document Released: 04/05/2008 Document Revised: 11/11/2014 Document Reviewed: 06/23/2014 °Elsevier Interactive Patient Education ©2016 Elsevier Inc. ° °

## 2015-05-01 NOTE — ED Notes (Signed)
Daughter brings mother for wound check States mother had right 5th toe amputated.  A&O x4... No acute distress.

## 2015-05-04 ENCOUNTER — Other Ambulatory Visit: Payer: Self-pay | Admitting: Internal Medicine

## 2015-05-04 ENCOUNTER — Encounter: Payer: Self-pay | Admitting: *Deleted

## 2015-05-04 ENCOUNTER — Ambulatory Visit: Payer: Medicaid Other | Attending: Internal Medicine

## 2015-05-04 ENCOUNTER — Other Ambulatory Visit: Payer: Self-pay | Admitting: *Deleted

## 2015-05-04 DIAGNOSIS — L089 Local infection of the skin and subcutaneous tissue, unspecified: Principal | ICD-10-CM

## 2015-05-04 DIAGNOSIS — G894 Chronic pain syndrome: Secondary | ICD-10-CM

## 2015-05-04 DIAGNOSIS — E11628 Type 2 diabetes mellitus with other skin complications: Secondary | ICD-10-CM

## 2015-05-04 MED ORDER — DOXYCYCLINE HYCLATE 100 MG PO TABS: 100.0000 mg | ORAL_TABLET | Freq: Two times a day (BID) | ORAL | Status: DC

## 2015-05-04 MED ORDER — ACETAMINOPHEN-CODEINE #3 300-30 MG PO TABS: 1.0000 | ORAL_TABLET | ORAL | Status: DC | PRN

## 2015-05-04 NOTE — Telephone Encounter (Signed)
Pain medication refilled

## 2015-05-05 ENCOUNTER — Ambulatory Visit: Payer: Self-pay

## 2015-05-28 ENCOUNTER — Telehealth: Payer: Self-pay | Admitting: Internal Medicine

## 2015-05-28 NOTE — Telephone Encounter (Signed)
saxagliptin HCl (ONGLYZA) 2.5 MG TABS tablet needs this refilled and was told to contact medicaid for approval.  insulin aspart (NOVOLOG) 100 UNIT/ML injection clotrimazole (LOTRIMIN) 1 % cream  She would like for these medication to be sent to [facility] in [place].

## 2015-06-01 ENCOUNTER — Other Ambulatory Visit: Payer: Self-pay | Admitting: *Deleted

## 2015-06-01 DIAGNOSIS — E1311 Other specified diabetes mellitus with ketoacidosis with coma: Secondary | ICD-10-CM

## 2015-06-01 DIAGNOSIS — E1051 Type 1 diabetes mellitus with diabetic peripheral angiopathy without gangrene: Secondary | ICD-10-CM

## 2015-06-01 DIAGNOSIS — E1165 Type 2 diabetes mellitus with hyperglycemia: Secondary | ICD-10-CM

## 2015-06-01 DIAGNOSIS — B369 Superficial mycosis, unspecified: Secondary | ICD-10-CM

## 2015-06-01 MED ORDER — INSULIN ASPART 100 UNIT/ML ~~LOC~~ SOLN: 0.0000 [IU] | Freq: Three times a day (TID) | SUBCUTANEOUS | Status: DC

## 2015-06-01 MED ORDER — CLOTRIMAZOLE 1 % EX CREA: 1.0000 "application " | TOPICAL_CREAM | Freq: Two times a day (BID) | CUTANEOUS | Status: DC

## 2015-06-01 MED ORDER — SAXAGLIPTIN HCL 2.5 MG PO TABS: 5.0000 mg | ORAL_TABLET | Freq: Every day | ORAL | Status: DC

## 2015-06-01 NOTE — Telephone Encounter (Signed)
Patients medications have been refilled.  Onglyza, Novolog, Lotrimin

## 2015-06-08 NOTE — Progress Notes (Unsigned)
Patient Prior authorization for Onglyza was faxed on Wednesday, 23rd and again on today, Monday, 29th to [facility] track.

## 2015-06-22 ENCOUNTER — Telehealth: Payer: Self-pay | Admitting: Internal Medicine

## 2015-06-22 ENCOUNTER — Other Ambulatory Visit: Payer: Self-pay | Admitting: Internal Medicine

## 2015-06-22 NOTE — Telephone Encounter (Signed)
Patient called stating that she received a letter from [facility] stating that they wont cover the medication saxagliptin HCl (ONGLYZA), Patient would like to be prescribed an alternative that medicaid will cover Patient also states that she lost a toe Please follow up with patient.

## 2015-06-29 NOTE — Telephone Encounter (Signed)
Patient also requested med refills for all of her current medications. Please f/u

## 2015-06-29 NOTE — Telephone Encounter (Signed)
Patient called stating that she received a letter from [facility] stating that they wont cover the medication saxagliptin HCl (ONGLYZA), Patient would like to be prescribed an alternative that medicaid will cover Patient also states that she lost a toe Please follow up with patient.

## 2015-07-01 NOTE — Telephone Encounter (Signed)
error 

## 2015-07-08 NOTE — Telephone Encounter (Signed)
Pt. Also requested a refill on all current medications. Please f/u

## 2015-07-08 NOTE — Telephone Encounter (Signed)
Patient called stating that she received a letter from [facility] stating that they wont cover the medication saxagliptin HCl (ONGLYZA), Patient would like to be prescribed an alternative that medicaid will cover Patient also states that she lost a toe Please follow up with patient.

## 2015-07-09 ENCOUNTER — Telehealth: Payer: Self-pay

## 2015-07-09 NOTE — Telephone Encounter (Signed)
Nurse spoke with [facility] pharmacy. PAtient ahs medicaid. Patient willhave same copay at pharmacy in [place]. Nurse spoke with Maudie Mercury, on patients chart.  Maudie Mercury is aware of nurse sending prescriptions for Onglyza, test strips and senna to pharmacy in [place].  Nurse spoke with [place], [place]. Per [place] PA has been sent for Onglyza.  Nurse called [facility] in [place] to check on status of Onglyza refills.  Per [facility], patient still needs PA on Onglyza, and patient has refills on test strips and they were picked up on 06/28/15 with no charge. Nurse can not send senna to pharmacy due to prescription being ordered by another doctor in 2014.  Patient will discuss need for senna at next doctors appointment. Per Maudie Mercury, patient will get senna OTC.

## 2015-07-09 NOTE — Telephone Encounter (Signed)
Nurse called patient via [employer], [place]. Nurse called patient to due to patient calling and requesting refill on all current medications and requesting onglyza to be changed to preferred medication.  Patient requested nurse to speak with sister. Sister explains metformin hurts patients stomach and she has tried other diabetic medications. Insurance paid for Onglyza for several years. Sister explains she is currently at foot doctor for patient in [place]. Sister requesting patient to be scheduled to be seen after the new year to see provider about medications and diabetes.  Nurse transferred patients sister to front office staff to schedule appointment to be seen.

## 2015-07-09 NOTE — Telephone Encounter (Signed)
Maudie Mercury [place] aware of nurse speaking with patients sister.  Kim C[place]ran aware of patients sister schedule appointment to be seen.  Maudie Mercury will call patient and return call to nurse if patient needs medication refills.

## 2015-07-09 NOTE — Telephone Encounter (Signed)
Kim returned call to nurse. Onglyza is not being covered, needs refilled. Patient has enough Novolog until appointment on the 12th. Valium refill to be discussed at appointment.  Test strips are not covered, needs refilled. Senna needs to be refilled. Medications are being filled in [place]. Kim questions if medications can be mailed.  Maudie Mercury reports patient does not have insurance.  Nurse recommended patient to check on getting prescriptions at [facility]. Nurse will talk to [facility] pharmacy about mailing prescription.

## 2015-07-16 ENCOUNTER — Other Ambulatory Visit: Payer: Self-pay | Admitting: Internal Medicine

## 2015-07-21 ENCOUNTER — Other Ambulatory Visit: Payer: Self-pay

## 2015-07-21 DIAGNOSIS — E1165 Type 2 diabetes mellitus with hyperglycemia: Secondary | ICD-10-CM

## 2015-07-21 MED ORDER — INSULIN GLARGINE 100 UNIT/ML ~~LOC~~ SOLN: 20.0000 [IU] | Freq: Every day | SUBCUTANEOUS | Status: DC

## 2015-07-21 MED ORDER — SAXAGLIPTIN HCL 2.5 MG PO TABS: 5.0000 mg | ORAL_TABLET | Freq: Every day | ORAL | Status: DC

## 2015-07-21 NOTE — Telephone Encounter (Signed)
Nurse called patient via [employer], [place]. Patient called front office earlier concerning Novolog prescription. Nurse called patient to assess needs.  Patients sister reports needing prescriptions for onglyza, Novolog, Lantus, senna and valium. Patients sister aware of Novolog being sent to pharmacy on 07/17/15. Nurse explained to patient senna was prescribed in 2014. Patient would need to get senna over the counter.  Nurse can not refill valium. Patient has appointment to see Dr. Lindell Noe on Thursday, July 23, 2015. Patient's sister agrees to request medication refills for medications at appointment.   Nurse called [facility] in [place]. Per [facility] [place] is ready for pickup.

## 2015-07-21 NOTE — Telephone Encounter (Signed)
Nurse resent prescription for onglyza, approved by Chari Manning. Mateo Flow signed signed Prior Authorization form.  Nurse faxed form to [facility] at [phone] as requested on bottom of form.

## 2015-07-23 ENCOUNTER — Encounter: Payer: Self-pay | Admitting: Family Medicine

## 2015-07-23 ENCOUNTER — Ambulatory Visit: Payer: Medicaid Other | Attending: Internal Medicine | Admitting: Family Medicine

## 2015-07-23 VITALS — BP 125/77 | HR 82 | Temp 98.3°F | Resp 18

## 2015-07-23 DIAGNOSIS — I10 Essential (primary) hypertension: Secondary | ICD-10-CM | POA: Diagnosis not present

## 2015-07-23 DIAGNOSIS — F4310 Post-traumatic stress disorder, unspecified: Secondary | ICD-10-CM

## 2015-07-23 DIAGNOSIS — E1051 Type 1 diabetes mellitus with diabetic peripheral angiopathy without gangrene: Secondary | ICD-10-CM | POA: Diagnosis not present

## 2015-07-23 DIAGNOSIS — E1165 Type 2 diabetes mellitus with hyperglycemia: Secondary | ICD-10-CM | POA: Diagnosis not present

## 2015-07-23 DIAGNOSIS — G894 Chronic pain syndrome: Secondary | ICD-10-CM

## 2015-07-23 MED ORDER — SENNA 8.6 MG PO TABS: 2.0000 | ORAL_TABLET | Freq: Every evening | ORAL | Status: DC | PRN

## 2015-07-23 MED ORDER — ACETAMINOPHEN-CODEINE #3 300-30 MG PO TABS: 1.0000 | ORAL_TABLET | ORAL | Status: DC | PRN

## 2015-07-23 MED ORDER — INSULIN GLARGINE 100 UNIT/ML ~~LOC~~ SOLN: 20.0000 [IU] | Freq: Every day | SUBCUTANEOUS | Status: DC

## 2015-07-23 MED ORDER — DIAZEPAM 2 MG PO TABS: 2.0000 mg | ORAL_TABLET | Freq: Three times a day (TID) | ORAL | Status: DC | PRN

## 2015-07-23 MED ORDER — PROPRANOLOL HCL ER 60 MG PO CP24: 60.0000 mg | ORAL_CAPSULE | Freq: Every day | ORAL | Status: DC

## 2015-07-23 MED ORDER — INSULIN ASPART 100 UNIT/ML ~~LOC~~ SOLN: SUBCUTANEOUS | Status: DC

## 2015-07-23 MED ORDER — LISINOPRIL-HYDROCHLOROTHIAZIDE 10-12.5 MG PO TABS: 1.0000 | ORAL_TABLET | Freq: Every day | ORAL | Status: DC

## 2015-07-23 NOTE — Assessment & Plan Note (Signed)
Chronic pain at site of R foot partial amputation. T#3 refill today. For further refills/management by primary physician.

## 2015-07-23 NOTE — Progress Notes (Signed)
   Subjective:    Patient ID: Debbie [name], female    DOB: May 02, 1961, 55 y.o.   MRN: [id number]  HPI Patient seen with assistance of a telephone interpreter.   She is here today with a family member who is the primary historian.  Requests refills of several of patient's medications.   Reports regular hypoglycemia to the 30-60 range with symptoms.  Has to force patient to eat, as her low sugars are more often when she lacks appetite.   Also frustrated by lack of coverage of new DM medication by Medicaid. Assurances given that our office has filed prior auths for this medicine.   At end of visit, reports that patient complaining of pain along medial aspect of R leg/gastroc.  Pain medicine not helping.   Having wound care in [place] and will complete tx there later this month, then to move back to [place].  Requests a dressing change next week by nurse before returning to [place] in 1 week.    Review of Systems     Objective:   Physical Exam Alert, sitting in wheelchair, no distress HEENT Neck supple.  COR Reg S1S2 PULM Clear bilat EXTS: Partial amputation forefoot R; dressing not removed. Tenderness to palpate along anteromedial aspect of R lower leg. The leg is warm; no calf tenderness or popliteal tenderness. No erythema or edema noted in area of concern. Symmetric when compared with L leg.        Assessment & Plan:

## 2015-07-23 NOTE — Assessment & Plan Note (Signed)
Patient with poorly controlled DM, despite family member reporting episodes of hypoglycemia to 30-60 with symptoms. A1C in the teens. Reports good adherence to insulin regimen. I recommend endocrinology evaluation given brittle DM, family member reports this will not be possible due to the family's temporary residence in [place].  To follow up with primary physician about this.

## 2015-07-23 NOTE — Patient Instructions (Signed)
It was a pleasure to see you today.   Refills on medications as requested during visit.   For pain along right leg, I recommend warm compresses frequently during the day.   Follow up with Dr Doreene Burke in 1 month.   RN visit to change patient's dressing on R foot in 1 week.

## 2015-07-27 ENCOUNTER — Ambulatory Visit: Payer: Medicaid Other | Attending: Internal Medicine

## 2015-07-27 VITALS — BP 150/92 | HR 83 | Temp 97.7°F | Resp 18

## 2015-07-27 DIAGNOSIS — Z029 Encounter for administrative examinations, unspecified: Secondary | ICD-10-CM | POA: Insufficient documentation

## 2015-07-27 DIAGNOSIS — L089 Local infection of the skin and subcutaneous tissue, unspecified: Secondary | ICD-10-CM

## 2015-07-27 DIAGNOSIS — E11628 Type 2 diabetes mellitus with other skin complications: Secondary | ICD-10-CM

## 2015-07-27 DIAGNOSIS — E1169 Type 2 diabetes mellitus with other specified complication: Secondary | ICD-10-CM

## 2015-07-27 NOTE — Progress Notes (Signed)
Patient here for wound check/dressing change.  Patient reports right foot pain, at level 8, described as aching.  Pain comes and goes, pain medication, Tylenol 3, helps pain. Arabic Interpreter [phone]  Hands get stiff at night when patient sleeps and "I can feel it ticking or something tingling in arm". Patient explains both arms and legs feel numb during the afternoon and at night. Nurse requested patient to make appointment at front desk to be seen in 1 month as advised by Dr. Lindell Noe in last OV note.   Patient is going to [facility] for hyperbaric wound care. Patients sister called Levada Dy with [facility]. Levada Dy at [facility] spoke with [facility] nurse, Nira Conn, for dressing change directions. Per Levada Dy dressing change as follows: triple antibiotic ointment in big toe wound area, gauze, abdominal pad, kerlix, coban. Wound dressing was not supposed to be changed until patient returns to [facility]. Per Levada Dy it is okay for Sandralee Tarkington to change bandage. Levada Dy requests nurse to fax medication list to [phone], Levada Dy is with Dr. Jaclyn Shaggy, Medical director at [facility]   Wound in great toe area, 2.5 cm circle, draining moderate amount of serosanguinous fluid.

## 2015-08-27 ENCOUNTER — Ambulatory Visit: Payer: Self-pay | Admitting: Internal Medicine

## 2015-10-07 ENCOUNTER — Other Ambulatory Visit: Payer: Self-pay | Admitting: Internal Medicine

## 2015-10-24 ENCOUNTER — Other Ambulatory Visit: Payer: Self-pay | Admitting: Internal Medicine

## 2015-11-05 ENCOUNTER — Ambulatory Visit: Payer: Medicaid Other | Attending: Internal Medicine | Admitting: Internal Medicine

## 2015-11-05 ENCOUNTER — Encounter: Payer: Self-pay | Admitting: Internal Medicine

## 2015-11-05 ENCOUNTER — Other Ambulatory Visit: Payer: Self-pay | Admitting: Internal Medicine

## 2015-11-05 VITALS — BP 130/83 | HR 82 | Temp 97.5°F | Resp 18 | Ht 65.0 in

## 2015-11-05 DIAGNOSIS — I739 Peripheral vascular disease, unspecified: Secondary | ICD-10-CM | POA: Insufficient documentation

## 2015-11-05 DIAGNOSIS — E1169 Type 2 diabetes mellitus with other specified complication: Secondary | ICD-10-CM | POA: Diagnosis not present

## 2015-11-05 DIAGNOSIS — G894 Chronic pain syndrome: Secondary | ICD-10-CM | POA: Diagnosis not present

## 2015-11-05 DIAGNOSIS — E669 Obesity, unspecified: Secondary | ICD-10-CM | POA: Diagnosis not present

## 2015-11-05 DIAGNOSIS — H269 Unspecified cataract: Secondary | ICD-10-CM | POA: Insufficient documentation

## 2015-11-05 DIAGNOSIS — Z79899 Other long term (current) drug therapy: Secondary | ICD-10-CM | POA: Insufficient documentation

## 2015-11-05 DIAGNOSIS — E11628 Type 2 diabetes mellitus with other skin complications: Secondary | ICD-10-CM | POA: Diagnosis not present

## 2015-11-05 DIAGNOSIS — R197 Diarrhea, unspecified: Secondary | ICD-10-CM | POA: Diagnosis not present

## 2015-11-05 DIAGNOSIS — Z794 Long term (current) use of insulin: Secondary | ICD-10-CM | POA: Diagnosis not present

## 2015-11-05 DIAGNOSIS — I10 Essential (primary) hypertension: Secondary | ICD-10-CM | POA: Insufficient documentation

## 2015-11-05 DIAGNOSIS — E1165 Type 2 diabetes mellitus with hyperglycemia: Secondary | ICD-10-CM | POA: Insufficient documentation

## 2015-11-05 DIAGNOSIS — E11621 Type 2 diabetes mellitus with foot ulcer: Secondary | ICD-10-CM | POA: Insufficient documentation

## 2015-11-05 DIAGNOSIS — L089 Local infection of the skin and subcutaneous tissue, unspecified: Secondary | ICD-10-CM | POA: Diagnosis not present

## 2015-11-05 DIAGNOSIS — E118 Type 2 diabetes mellitus with unspecified complications: Secondary | ICD-10-CM

## 2015-11-05 DIAGNOSIS — E039 Hypothyroidism, unspecified: Secondary | ICD-10-CM | POA: Insufficient documentation

## 2015-11-05 DIAGNOSIS — Z880 Allergy status to penicillin: Secondary | ICD-10-CM | POA: Insufficient documentation

## 2015-11-05 DIAGNOSIS — Z888 Allergy status to other drugs, medicaments and biological substances status: Secondary | ICD-10-CM | POA: Insufficient documentation

## 2015-11-05 LAB — CBC WITH DIFFERENTIAL/PLATELET
BASOS ABS: 0 {cells}/uL (ref 0–200)
Basophils Relative: 0 %
EOS ABS: 448 {cells}/uL (ref 15–500)
Eosinophils Relative: 4 %
HCT: 41.7 % (ref 35.0–45.0)
Hemoglobin: 13.9 g/dL (ref 11.7–15.5)
Lymphocytes Relative: 39 %
Lymphs Abs: 4368 cells/uL — ABNORMAL HIGH (ref 850–3900)
MCH: 27.3 pg (ref 27.0–33.0)
MCHC: 33.3 g/dL (ref 32.0–36.0)
MCV: 81.9 fL (ref 80.0–100.0)
MONOS PCT: 4 %
MPV: 10.6 fL (ref 7.5–12.5)
Monocytes Absolute: 448 cells/uL (ref 200–950)
NEUTROS PCT: 53 %
Neutro Abs: 5936 cells/uL (ref 1500–7800)
PLATELETS: 270 10*3/uL (ref 140–400)
RBC: 5.09 MIL/uL (ref 3.80–5.10)
RDW: 14.5 % (ref 11.0–15.0)
WBC: 11.2 10*3/uL — ABNORMAL HIGH (ref 3.8–10.8)

## 2015-11-05 MED ORDER — DIAZEPAM 2 MG PO TABS: 2.0000 mg | ORAL_TABLET | Freq: Three times a day (TID) | ORAL | Status: DC | PRN

## 2015-11-05 MED ORDER — ACETAMINOPHEN-CODEINE #3 300-30 MG PO TABS
1.0000 | ORAL_TABLET | ORAL | Status: DC | PRN
Start: 2015-11-05 — End: 2015-12-24

## 2015-11-05 MED ORDER — SAXAGLIPTIN HCL 2.5 MG PO TABS: 5.0000 mg | ORAL_TABLET | Freq: Every day | ORAL | Status: DC

## 2015-11-05 MED ORDER — METRONIDAZOLE 500 MG PO TABS: 500.0000 mg | ORAL_TABLET | Freq: Two times a day (BID) | ORAL | Status: AC

## 2015-11-05 NOTE — Progress Notes (Signed)
Patient ID: Debbie [name], female   DOB: 1960/10/19, 55 y.o.   MRN: [id number]   Debbie [name], is a 55 y.o. female  [id number]  [id number]  DOB - 24-Dec-1960  Chief Complaint  Patient presents with  . Follow-up    HTN        Subjective:   Debbie [name] is a 55 y.o. female with history of hypertension, diabetes mellitus, blindness from bilateral cataract, recent transmetatarsal amputation of the right small toe here today for a follow up visit and medication management. Major complaint today is new onset pain and numbness of the left big toe, and patient is very worried that she will have another amputation soon if something is not done weekly. She claimed her blood sugar has been better controlled, she is now compliant with all her medications now that her sister is taking care of her. She has not had any syncopal episode, she has no fever. She continues to have wound dressing regularly, no evidence of infection. She follows up with orthopedic surgery at [place], [place]. She is also complaining of diarrhea that has been ongoing for more than 2 months, sometimes up to 6 bowel movements per day, not foul-smelling, no mucus, no blood. She has no nausea or vomiting, no abdominal pain, no pelvic pain or pressure. She is also requesting refill of some of her medications.  Problem  Type 2 Diabetes Mellitus With Complication, With Long-Term Current Use of Insulin (Hcc)  Diarrhea  Pad (Peripheral Artery Disease) (Hcc)    ALLERGIES: Allergies  Allergen Reactions  . Hydrocortisone   . Penicillins Rash    PAST MEDICAL HISTORY: Past Medical History  Diagnosis Date  . Type II or unspecified type diabetes mellitus with unspecified complication, uncontrolled     x ~ 10 years  . Hypertension     x ~ 10 years  . Hypothyroidism   . Chest pain     a. ? MI in 2010, [place]->medically managed  . Diabetic foot ulcer ([place])   . Obesity   . Cataract   . Blind     MEDICATIONS AT HOME: Prior  to Admission medications   Medication Sig Start Date End Date Taking? Authorizing Provider  acetaminophen (TYLENOL) 325 MG tablet Take 2 tablets (650 mg total) by mouth every 6 (six) hours as needed. 01/02/13  Yes Nita Sells, MD  canagliflozin (INVOKANA) 100 MG TABS tablet TAKE ONE (1) TABLET BY MOUTH EVERY DAY 02/05/15  Yes Tresa Garter, MD  ciprofloxacin (CIPRO) 500 MG tablet Take 1 tablet (500 mg total) by mouth 2 (two) times daily. 04/13/15  Yes Tresa Garter, MD  clotrimazole (LOTRIMIN) 1 % cream Apply 1 application topically 2 (two) times daily. 06/01/15  Yes Tresa Garter, MD  diazepam (VALIUM) 2 MG tablet Take 1 tablet (2 mg total) by mouth every 8 (eight) hours as needed for anxiety. 11/05/15  Yes Tresa Garter, MD  doxycycline (VIBRA-TABS) 100 MG tablet Take 1 tablet (100 mg total) by mouth 2 (two) times daily. 05/04/15  Yes Tresa Garter, MD  glucose blood (ACCU-CHEK AVIVA PLUS) test strip 1 each by Other route See admin instructions. Use as instructed 04/01/15  Yes Shazia Mitchener E Doreene Burke, MD  insulin aspart (NOVOLOG) 100 UNIT/ML injection SEE NOTES Dispense QH 1 month 07/23/15  Yes Willeen Niece, MD  insulin glargine (LANTUS) 100 UNIT/ML injection Inject 0.2 mLs (20 Units total) into the skin at bedtime. 07/23/15  Yes Willeen Niece, MD  Insulin Syringe-Needle U-100 (  INSULIN SYRINGE 1CC/30GX5/16") 30G X 5/16" 1 ML MISC USE AS DIRECTED WITH LANTUS AND NOVOLOG INJECTIONS 10/08/15  Yes Tresa Garter, MD  Lancets (ACCU-CHEK SOFT TOUCH) lancets Use as instructed 01/09/15  Yes Tresa Garter, MD  lisinopril-hydrochlorothiazide (PRINZIDE,ZESTORETIC) 10-12.5 MG tablet Take 1 tablet by mouth daily. 07/23/15  Yes Willeen Niece, MD  loteprednol (LOTEMAX) 0.5 % ophthalmic suspension Place 1 drop into both eyes 2 (two) times daily. 01/09/14  Yes Tresa Garter, MD  propranolol ER (INDERAL LA) 60 MG 24 hr capsule Take 1 capsule (60 mg total) by mouth daily.  07/23/15  Yes Willeen Niece, MD  saxagliptin HCl (ONGLYZA) 2.5 MG TABS tablet Take 2 tablets (5 mg total) by mouth daily. 11/05/15  Yes Tresa Garter, MD  senna (SENOKOT) 8.6 MG TABS tablet Take 2 tablets (17.2 mg total) by mouth at bedtime as needed (for constipation). 07/23/15  Yes Willeen Niece, MD  sertraline (ZOLOFT) 50 MG tablet Take 1 tablet (50 mg total) by mouth daily. 02/05/15  Yes Tresa Garter, MD  acetaminophen-codeine (TYLENOL #3) 300-30 MG tablet Take 1 tablet by mouth every 4 (four) hours as needed. 11/05/15   Tresa Garter, MD  Docusate Sodium (DSS) 100 MG CAPS Take 100 mg by mouth 2 (two) times daily. Patient not taking: Reported on 11/05/2015 07/24/14   Tresa Garter, MD  metroNIDAZOLE (FLAGYL) 500 MG tablet Take 1 tablet (500 mg total) by mouth 2 (two) times daily. 11/05/15 11/12/15  Tresa Garter, MD     Objective:   Filed Vitals:   11/05/15 1600  BP: 130/83  Pulse: 82  Temp: 97.5 F (36.4 C)  TempSrc: Oral  Resp: 18  Height: 5\' 5"  (1.651 m)  SpO2: 99%    Exam General appearance : Awake, alert, not in any distress. Speech Clear. Not toxic looking, or wheelchair HEENT: Atraumatic and Normocephalic, pupils equally reactive to light and accomodation Neck: supple, no JVD. No cervical lymphadenopathy.  Chest:Good air entry bilaterally, no added sounds  CVS: S1 S2 regular, no murmurs.  Abdomen: Bowel sounds present, Non tender and not distended with no gaurding, rigidity or rebound. Extremities: Right foot status post transmetatarsal amputation, wound dressing clean and dry, no cough tenderness, no erythema, no edema. Left foot showed reduced dorsalis pedis pulsation.  Neurology: Awake alert, and oriented X 3, CN II-XII intact, Non focal Skin: No Rash  Data Review Lab Results  Component Value Date   HGBA1C 13.50 02/05/2015   HGBA1C >15.0 07/24/2014   HGBA1C 14.9 03/06/2014     Assessment & Plan   1. Essential hypertension  We have  discussed target BP range and blood pressure goal. I have advised patient to check BP regularly and to call us back or report to clinic if the numbers are consistently higher than 140/90. We discussed the importance of compliance with medical therapy and DASH diet recommended, consequences of uncontrolled hypertension discussed.   - continue current BP medications  2. Chronic pain syndrome  - acetaminophen-codeine (TYLENOL #3) 300-30 MG tablet; Take 1 tablet by mouth every 4 (four) hours as needed.  Dispense: 60 tablet; Refill: 0 - diazepam (VALIUM) 2 MG tablet; Take 1 tablet (2 mg total) by mouth every 8 (eight) hours as needed for anxiety.  Dispense: 60 tablet; Refill: 0  3. Type 2 diabetes mellitus with complication, with long-term current use of insulin (HCC)  - saxagliptin HCl (ONGLYZA) 2.5 MG TABS tablet; Take 2 tablets (5 mg total)  by mouth daily.  Dispense: 180 tablet; Refill: 3 - CBC with Differential/Platelet - COMPLETE METABOLIC PANEL WITH GFR - Lipid panel - Microalbumin/Creatinine Ratio, Urine  Aim for 30 minutes of exercise most days. Rethink what you drink. Water is great! Aim for 2-3 Carb Choices per meal (30-45 grams) +/- 1 either way  Aim for 0-15 Carbs per snack if hungry  Include protein in moderation with your meals and snacks  Consider reading food labels for Total Carbohydrate and Fat Grams of foods  Consider checking BG at alternate times per day  Continue taking medication as directed Be mindful about how much sugar you are adding to beverages and other foods. Fruit Punch - find one with no sugar  Measure and decrease portions of carbohydrate foods  Make your plate and don't go back for seconds   4. Diabetic foot infection ([place])  Continue daily dressing  Wound Care  5. Diarrhea, unspecified type  - Clostridium difficile EIA - Stool Cells, WBC & RBC - Ova and parasite examination - metroNIDAZOLE (FLAGYL) 500 MG tablet; Take 1 tablet (500 mg total) by  mouth 2 (two) times daily.  Dispense: 14 tablet; Refill: 0  6. PAD (peripheral artery disease) (HCC)  - IR Angiogram Selective Each Additional Vessel; Future - Ambulatory referral to Vascular Surgery  Patient have been counseled extensively about nutrition and exercise  Return in about 4 weeks (around 12/03/2015) for Diabetic Foot, Diarrhea, PAD, Follow up Pain and comorbidities.  The patient was given clear instructions to go to ER or return to medical center if symptoms don't improve, worsen or new problems develop. The patient verbalized understanding. The patient was told to call to get lab results if they haven't heard anything in the next week.   This note has been created with Surveyor, quantity. Any transcriptional errors are unintentional.    Angelica Chessman, MD, [place], [facility]n [place], [place] [facility] [place], [place]   11/05/2015, 4:43 PM

## 2015-11-05 NOTE — Progress Notes (Signed)
Patient is here for FU DM + HTN

## 2015-11-05 NOTE — Patient Instructions (Signed)
Basic Carbohydrate Counting for Diabetes Mellitus Carbohydrate counting is a method for keeping track of the amount of carbohydrates you eat. Eating carbohydrates naturally increases the level of sugar (glucose) in your blood, so it is important for you to know the amount that is okay for you to have in every meal. Carbohydrate counting helps keep the level of glucose in your blood within normal limits. The amount of carbohydrates allowed is different for every person. A dietitian can help you calculate the amount that is right for you. Once you know the amount of carbohydrates you can have, you can count the carbohydrates in the foods you want to eat. Carbohydrates are found in the following foods:  Grains, such as breads and cereals.  Dried beans and soy products.  Starchy vegetables, such as potatoes, peas, and corn.  Fruit and fruit juices.  Milk and yogurt.  Sweets and snack foods, such as cake, cookies, candy, chips, soft drinks, and fruit drinks. CARBOHYDRATE COUNTING There are two ways to count the carbohydrates in your food. You can use either of the methods or a combination of both. Reading the "Nutrition Facts" on Packaged Food The "Nutrition Facts" is an area that is included on the labels of almost all packaged food and beverages in the [place]. It includes the serving size of that food or beverage and information about the nutrients in each serving of the food, including the grams (g) of carbohydrate per serving.  Decide the number of servings of this food or beverage that you will be able to eat or drink. Multiply that number of servings by the number of grams of carbohydrate that is listed on the label for that serving. The total will be the amount of carbohydrates you will be having when you eat or drink this food or beverage. Learning Standard Serving Sizes of Food When you eat food that is not packaged or does not include "Nutrition Facts" on the label, you need to  measure the servings in order to count the amount of carbohydrates.A serving of most carbohydrate-rich foods contains about 15 g of carbohydrates. The following list includes serving sizes of carbohydrate-rich foods that provide 15 g ofcarbohydrate per serving:   1 slice of bread (1 oz) or 1 six-inch tortilla.    of a hamburger bun or English muffin.  4-6 crackers.   cup unsweetened dry cereal.    cup hot cereal.   cup rice or pasta.    cup mashed potatoes or  of a large baked potato.  1 cup fresh fruit or one small piece of fruit.    cup canned or frozen fruit or fruit juice.  1 cup milk.   cup plain fat-free yogurt or yogurt sweetened with artificial sweeteners.   cup cooked dried beans or starchy vegetable, such as peas, corn, or potatoes.  Decide the number of standard-size servings that you will eat. Multiply that number of servings by 15 (the grams of carbohydrates in that serving). For example, if you eat 2 cups of strawberries, you will have eaten 2 servings and 30 g of carbohydrates (2 servings x 15 g = 30 g). For foods such as soups and casseroles, in which more than one food is mixed in, you will need to count the carbohydrates in each food that is included. EXAMPLE OF CARBOHYDRATE COUNTING Sample Dinner  3 oz chicken breast.   cup of brown rice.   cup of corn.  1 cup milk.   1 cup strawberries with   sugar-free whipped topping.  Carbohydrate Calculation Step 1: Identify the foods that contain carbohydrates:   Rice.   Corn.   Milk.   Strawberries. Step 2:Calculate the number of servings eaten of each:   2 servings of rice.   1 serving of corn.   1 serving of milk.   1 serving of strawberries. Step 3: Multiply each of those number of servings by 15 g:   2 servings of rice x 15 g = 30 g.   1 serving of corn x 15 g = 15 g.   1 serving of milk x 15 g = 15 g.   1 serving of strawberries x 15 g = 15 g. Step 4: Add  together all of the amounts to find the total grams of carbohydrates eaten: 30 g + 15 g + 15 g + 15 g = 75 g.   This information is not intended to replace advice given to you by your health care provider. Make sure you discuss any questions you have with your health care provider.   Document Released: 06/27/2005 Document Revised: 07/18/2014 Document Reviewed: 05/24/2013 Elsevier Interactive Patient Education 2016 Elsevier Inc[employer] Diabetes and Exercise Exercising regularly is important. It is not just about losing weight. It has many health benefits, such as:  Improving your overall fitness, flexibility, and endurance.  Increasing your bone density.  Helping with weight control.  Decreasing your body fat.  Increasing your muscle strength.  Reducing stress and tension.  Improving your overall health. People with diabetes who exercise gain additional benefits because exercise:  Reduces appetite.  Improves the body's use of blood sugar (glucose).  Helps lower or control blood glucose.  Decreases blood pressure.  Helps control blood lipids (such as cholesterol and triglycerides).  Improves the body's use of the hormone insulin by:  Increasing the body's insulin sensitivity.  Reducing the body's insulin needs.  Decreases the risk for heart disease because exercising:  Lowers cholesterol and triglycerides levels.  Increases the levels of good cholesterol (such as high-density lipoproteins [HDL]) in the body.  Lowers blood glucose levels. YOUR ACTIVITY PLAN  Choose an activity that you enjoy, and set realistic goals. To exercise safely, you should begin practicing any new physical activity slowly, and gradually increase the intensity of the exercise over time. Your health care provider or diabetes educator can help create an activity plan that works for you. General recommendations include:  Encouraging children to engage in at least 60 minutes of physical activity each  day.  Stretching and performing strength training exercises, such as yoga or weight lifting, at least 2 times per week.  Performing a total of at least 150 minutes of moderate-intensity exercise each week, such as brisk walking or water aerobics.  Exercising at least 3 days per week, making sure you allow no more than 2 consecutive days to pass without exercising.  Avoiding long periods of inactivity (90 minutes or more). When you have to spend an extended period of time sitting down, take frequent breaks to walk or stretch. RECOMMENDATIONS FOR EXERCISING WITH TYPE 1 OR TYPE 2 DIABETES   Check your blood glucose before exercising. If blood glucose levels are greater than 240 mg/dL, check for urine ketones. Do not exercise if ketones are present.  Avoid injecting insulin into areas of the body that are going to be exercised. For example, avoid injecting insulin into:  The arms when playing tennis.  The legs when jogging.  Keep a record of:  Food intake before   and after you exercise.  Expected peak times of insulin action.  Blood glucose levels before and after you exercise.  The type and amount of exercise you have done.  Review your records with your health care provider. Your health care provider will help you to develop guidelines for adjusting food intake and insulin amounts before and after exercising.  If you take insulin or oral hypoglycemic agents, watch for signs and symptoms of hypoglycemia. They include:  Dizziness.  Shaking.  Sweating.  Chills.  Confusion.  Drink plenty of water while you exercise to prevent dehydration or heat stroke. Body water is lost during exercise and must be replaced.  Talk to your health care provider before starting an exercise program to make sure it is safe for you. Remember, almost any type of activity is better than none.   This information is not intended to replace advice given to you by your health care provider. Make sure you  discuss any questions you have with your health care provider.   Document Released: 09/17/2003 Document Revised: 11/11/2014 Document Reviewed: 12/04/2012 Elsevier Interactive Patient Education 2016 Elsevier Inc.  

## 2015-11-06 LAB — LIPID PANEL
Cholesterol: 185 mg/dL (ref 125–200)
HDL: 50 mg/dL (ref >=46)
LDL Cholesterol: 119 mg/dL (ref <130)
TRIGLYCERIDES: 81 mg/dL (ref <150)
Total CHOL/HDL Ratio: 3.7 Ratio (ref <=5.0)
VLDL: 16 mg/dL (ref <30)

## 2015-11-06 LAB — COMPLETE METABOLIC PANEL WITH GFR
ALBUMIN: 3.7 g/dL (ref 3.6–5.1)
ALK PHOS: 102 U/L (ref 33–130)
ALT: 31 U/L — ABNORMAL HIGH (ref 6–29)
AST: 19 U/L (ref 10–35)
BILIRUBIN TOTAL: 0.4 mg/dL (ref 0.2–1.2)
BUN: 17 mg/dL (ref 7–25)
CO2: 25 mmol/L (ref 20–31)
Calcium: 9.1 mg/dL (ref 8.6–10.4)
Chloride: 105 mmol/L (ref 98–110)
Creat: 0.76 mg/dL (ref 0.50–1.05)
GFR, EST NON AFRICAN AMERICAN: 89 mL/min (ref >=60)
Glucose, Bld: 62 mg/dL — ABNORMAL LOW (ref 65–99)
Potassium: 5 mmol/L (ref 3.5–5.3)
Sodium: 142 mmol/L (ref 135–146)
TOTAL PROTEIN: 6.6 g/dL (ref 6.1–8.1)

## 2015-11-06 MED FILL — ACETAMINOPHEN/COD #3 TABLET: 300-30 | 10 days supply | Qty: 60 | Fill #0

## 2015-11-09 ENCOUNTER — Telehealth: Payer: Self-pay | Admitting: Internal Medicine

## 2015-11-09 NOTE — Telephone Encounter (Signed)
Pt. Sister came in today requesting for a Triamcinolone 0.1% cream. Pt. Said she was giving the wrong one. Please f/u

## 2015-11-10 MED ORDER — CLOTRIMAZOLE 1 % EX CREA: TOPICAL_CREAM | CUTANEOUS | Status: DC

## 2015-11-10 NOTE — Telephone Encounter (Signed)
Patient verified DOB Patients sister is aware of lab results being normal. Patient will receive a phone call with her appointment to complete IR Angiogram. Patients cream has also been ordered to [facility] for pickup. No further questions at this time.

## 2015-11-10 NOTE — Telephone Encounter (Signed)
-----   Message from Tresa Garter, MD sent at 11/09/2015 12:49 PM EDT ----- Please inform patient that her lab results are mostly normal. Expecting the vascular study.

## 2015-11-11 ENCOUNTER — Other Ambulatory Visit: Payer: Self-pay

## 2015-11-11 ENCOUNTER — Other Ambulatory Visit: Payer: Self-pay | Admitting: Pharmacist

## 2015-11-11 DIAGNOSIS — I739 Peripheral vascular disease, unspecified: Secondary | ICD-10-CM

## 2015-11-11 MED ORDER — SITAGLIPTIN PHOSPHATE 100 MG PO TABS: 100.0000 mg | ORAL_TABLET | Freq: Every day | ORAL | Status: DC

## 2015-11-12 ENCOUNTER — Other Ambulatory Visit: Payer: Self-pay | Admitting: Internal Medicine

## 2015-11-12 DIAGNOSIS — I739 Peripheral vascular disease, unspecified: Secondary | ICD-10-CM

## 2015-11-13 ENCOUNTER — Telehealth: Payer: Self-pay | Admitting: Internal Medicine

## 2015-11-13 ENCOUNTER — Other Ambulatory Visit: Payer: Self-pay | Admitting: Internal Medicine

## 2015-11-13 MED ORDER — TRIAMCINOLONE ACETONIDE 0.1 % EX CREA: 1.0000 "application " | TOPICAL_CREAM | Freq: Two times a day (BID) | CUTANEOUS | Status: DC

## 2015-11-13 NOTE — Telephone Encounter (Signed)
Patient's sister is very concerned about not receiving prescription for cream...the patient is completely out of the cream and is experiencing pain, the cream assist with her pain and discomfort....please follow up with patient in regards to request  Triamcinolone 0.1% cream

## 2015-11-17 ENCOUNTER — Other Ambulatory Visit: Payer: Self-pay | Admitting: Family Medicine

## 2015-12-03 ENCOUNTER — Ambulatory Visit: Payer: Self-pay | Admitting: Internal Medicine

## 2015-12-23 ENCOUNTER — Other Ambulatory Visit (HOSPITAL_COMMUNITY): Payer: Self-pay | Admitting: Interventional Radiology

## 2015-12-23 ENCOUNTER — Other Ambulatory Visit: Payer: Self-pay | Admitting: *Deleted

## 2015-12-23 ENCOUNTER — Ambulatory Visit
Admission: RE | Admit: 2015-12-23 | Discharge: 2015-12-23 | Disposition: A | Discharge status: Home / Self Care | Payer: Medicaid Other | Source: Ambulatory Visit | Attending: Internal Medicine | Admitting: Internal Medicine

## 2015-12-23 ENCOUNTER — Ambulatory Visit
Admission: RE | Admit: 2015-12-23 | Discharge: 2015-12-23 | Disposition: A | Discharge status: Home / Self Care | Payer: Medicaid Other | Source: Ambulatory Visit | Attending: Interventional Radiology | Admitting: Interventional Radiology

## 2015-12-23 DIAGNOSIS — I739 Peripheral vascular disease, unspecified: Secondary | ICD-10-CM

## 2015-12-23 NOTE — Consult Note (Signed)
Chief Complaint: Patient was seen in consultation today for peripheral arterial disease at the request of Jegede,Olugbemiga E  Referring Physician(s): Jegede,Olugbemiga E  History of Present Illness: Debbie [name] is a 55 y.o. female from [place] who presents with a clinical history of severe diabetes which was poorly controlled for years before she came to the [place] in 2014. She speaks no English but her sister is with her speaks some [place] and serves as a Manufacturing engineer. She has a history of gangrenous changes of the right forefoot which was successfully treated via a combination of hyperbaric oxygen and transmetatarsal amputation. Her TMA surgical site is very well healed. She continues to have intermittent pain at the site of amputation which is not unexpected.  She has recently developed rest pain in the left lower extremity beginning below the knee and extending into the foot, particularly the great toe. The great toe is mildly ruborus.   She is not and has never been a smoker.  Her renal function was adequate on laboratory evaluation this past April.    Past Medical History  Diagnosis Date  . Type II or unspecified type diabetes mellitus with unspecified complication, uncontrolled     x ~ 10 years  . Hypertension     x ~ 10 years  . Hypothyroidism   . Chest pain     a. ? MI in 2010, [place]->medically managed  . Diabetic foot ulcer ([place])   . Obesity   . Cataract   . Blind     Past Surgical History  Procedure Laterality Date  . Back surgery    . Tubal ligation      Allergies: Hydrocortisone and Penicillins  Medications: Prior to Admission medications   Medication Sig Start Date End Date Taking? Authorizing Provider  acetaminophen (TYLENOL) 325 MG tablet Take 2 tablets (650 mg total) by mouth every 6 (six) hours as needed. 01/02/13  Yes Nita Sells, MD  acetaminophen-codeine (TYLENOL #3) 300-30 MG tablet Take 1 tablet by mouth every 4 (four)  hours as needed. 11/05/15  Yes Tresa Garter, MD  canagliflozin (INVOKANA) 100 MG TABS tablet TAKE ONE (1) TABLET BY MOUTH EVERY DAY 02/05/15  Yes Tresa Garter, MD  diazepam (VALIUM) 2 MG tablet Take 1 tablet (2 mg total) by mouth every 8 (eight) hours as needed for anxiety. 11/05/15  Yes Tresa Garter, MD  Docusate Sodium (DSS) 100 MG CAPS Take 100 mg by mouth 2 (two) times daily. 07/24/14  Yes Tresa Garter, MD  glucose blood (ACCU-CHEK AVIVA PLUS) test strip 1 each by Other route See admin instructions. Use as instructed 04/01/15  Yes Olugbemiga E Doreene Burke, MD  insulin aspart (NOVOLOG) 100 UNIT/ML injection SEE NOTES Dispense QH 1 month 07/23/15  Yes Willeen Niece, MD  insulin glargine (LANTUS) 100 UNIT/ML injection Inject 0.2 mLs (20 Units total) into the skin at bedtime. 07/23/15  Yes Willeen Niece, MD  Insulin Syringe-Needle U-100 (INSULIN SYRINGE 1CC/30GX5/16") 30G X 5/16" 1 ML MISC USE AS DIRECTED WITH LANTUS AND NOVOLOG INJECTIONS 10/08/15  Yes Tresa Garter, MD  Lancets (ACCU-CHEK SOFT TOUCH) lancets Use as instructed 01/09/15  Yes Tresa Garter, MD  lisinopril-hydrochlorothiazide (PRINZIDE,ZESTORETIC) 10-12.5 MG tablet Take 1 tablet by mouth daily. 07/23/15  Yes Willeen Niece, MD  loteprednol (LOTEMAX) 0.5 % ophthalmic suspension Place 1 drop into both eyes 2 (two) times daily. 01/09/14  Yes Tresa Garter, MD  propranolol ER (INDERAL LA) 60 MG 24  hr capsule Take 1 capsule (60 mg total) by mouth daily. 07/23/15  Yes Willeen Niece, MD  senna (SENOKOT) 8.6 MG TABS tablet Take 2 tablets (17.2 mg total) by mouth at bedtime as needed (for constipation). 07/23/15  Yes Willeen Niece, MD  sertraline (ZOLOFT) 50 MG tablet Take 1 tablet (50 mg total) by mouth daily. 02/05/15  Yes Tresa Garter, MD  triamcinolone cream (KENALOG) 0.1 % Apply 1 application topically 2 (two) times daily. 11/13/15  Yes Tresa Garter, MD  ciprofloxacin (CIPRO) 500 MG tablet Take 1 tablet  (500 mg total) by mouth 2 (two) times daily. Patient not taking: Reported on 12/23/2015 04/13/15   Tresa Garter, MD  clotrimazole (LOTRIMIN) 1 % cream APPLY TO AFFECTED AREAS TWICE DAILY Patient not taking: Reported on 12/23/2015 11/10/15   Tresa Garter, MD  doxycycline (VIBRA-TABS) 100 MG tablet Take 1 tablet (100 mg total) by mouth 2 (two) times daily. Patient not taking: Reported on 12/23/2015 05/04/15   Tresa Garter, MD  sitaGLIPtin (JANUVIA) 100 MG tablet Take 1 tablet (100 mg total) by mouth daily. Patient not taking: Reported on 12/23/2015 11/11/15   Tresa Garter, MD     Family History  Problem Relation Age of Onset  . Diabetes Father     died in his 79's?  . Diabetes Father     died in her 73's?  . Diabetes Sister     alive and well.    Social History   Social History  . Marital Status: Single    Spouse Name: N/A  . Number of Children: N/A  . Years of Education: N/A   Social History Main Topics  . Smoking status: Never Smoker   . Smokeless tobacco: Not on file  . Alcohol Use: No  . Drug Use: No  . Sexual Activity: No   Other Topics Concern  . Not on file   Social History Narrative   From [place], [place].  Moved to [place] to live with sister in April of 2014.  She does not routinely exercise.   Review of Systems: A 12 point ROS discussed and pertinent positives are indicated in the HPI above.  All other systems are negative.  Review of Systems  Vital Signs: BP 160/61 mmHg  Pulse 90  Temp(Src) 98 F (36.7 C) (Oral)  Resp 14  Wt 229 lb (103.874 kg)  SpO2 98%  LMP 05/21/2013  Physical Exam  Constitutional: She is oriented to person, place, and time. She appears well-developed and well-nourished.  HENT:  Head: Normocephalic and atraumatic.  Eyes:  Bilateral cataracts.   Cardiovascular: Normal rate and regular rhythm.   Pulses:      Dorsalis pedis pulses are 0 on the right side, and 0 on the left side.       Posterior tibial pulses are 0  on the right side, and 0 on the left side.  Pulmonary/Chest: Effort normal.  Abdominal: Soft. She exhibits no distension. There is no tenderness.  Musculoskeletal:       Feet:  Well healed right TMA.   Left great toe mildly erythematous.  No drainage or open wound.  Capillary refill 2 seconds.  Skin shiny with hair loss.    Neurological: She is alert and oriented to person, place, and time.  Skin: Skin is warm and dry.  Psychiatric: She has a normal mood and affect. Her behavior is normal.  Nursing note and vitals reviewed.   Mallampati Score:  Imaging: No results found.  Labs:  CBC:  Recent Labs  02/05/15 1219 11/05/15 1644  WBC 8.1 11.2*  HGB 14.2 13.9  HCT 43.8 41.7  PLT 256 270    COAGS: No results for input(s): INR, APTT in the last 8760 hours.  BMP:  Recent Labs  02/05/15 1219 11/05/15 1644  NA 139 142  K 4.6 5.0  CL 103 105  CO2 24 25  GLUCOSE 402* 62*  BUN 19 17  CALCIUM 9.7 9.1  CREATININE 0.62 0.76  GFRNONAA >89 89  GFRAA >89 >89    LIVER FUNCTION TESTS:  Recent Labs  02/05/15 1219 11/05/15 1644  BILITOT 0.5 0.4  AST 16 19  ALT 30* 31*  ALKPHOS 138* 102  PROT 6.8 6.6  ALBUMIN 4.0 3.7    TUMOR MARKERS: No results for input(s): AFPTM, CEA, CA199, CHROMGRNA in the last 8760 hours.  Assessment and Plan:  55 year old [name] female with suspected Rutherford IV advanced peripheral arterial disease largely secondary to years of uncontrolled diabetes. I suspect she has predominantly runoff and/or small vessel disease.    Her right foot has been treated with a transmetatarsal amputation and is well-healed. However, her left great toe appears hypovascular and at risk for future infection.  1.) Noninvasive vascular ultrasound to assess ABIs, segmental pressures and PVRs. 2.) CTA runoff for complete evaluation of the arterial system and to assess for possible intervention. 3.) If she has treatable disease beyond small vessel disease she  will likely require endovascular intervention to provide in-line flow to the great toe to facilitate healing and prevent amputation.   4.) Begin Baby aspirin and Plavix daily. 5.) Continue aggressive glucose control, lifestyle and diet modification.  Thank you for this interesting consult.  I greatly enjoyed meeting Debbie [name] and look forward to participating in their care.  A copy of this report was sent to the requesting provider on this date.  Electronically Signed: Jacqulynn Cadet 12/23/2015, 11:13 AM   I spent a total of  40 Minutes   in face to face in clinical consultation, greater than 50% of which was counseling/coordinating care for peripheral arterial disease.

## 2015-12-24 ENCOUNTER — Ambulatory Visit: Payer: Medicaid Other | Attending: Internal Medicine | Admitting: Internal Medicine

## 2015-12-24 ENCOUNTER — Encounter: Payer: Self-pay | Admitting: Internal Medicine

## 2015-12-24 VITALS — BP 142/66 | HR 78 | Temp 98.2°F | Resp 18 | Ht 66.0 in

## 2015-12-24 DIAGNOSIS — E118 Type 2 diabetes mellitus with unspecified complications: Secondary | ICD-10-CM | POA: Insufficient documentation

## 2015-12-24 DIAGNOSIS — I10 Essential (primary) hypertension: Secondary | ICD-10-CM | POA: Insufficient documentation

## 2015-12-24 DIAGNOSIS — F4310 Post-traumatic stress disorder, unspecified: Secondary | ICD-10-CM | POA: Diagnosis not present

## 2015-12-24 DIAGNOSIS — E1151 Type 2 diabetes mellitus with diabetic peripheral angiopathy without gangrene: Secondary | ICD-10-CM | POA: Diagnosis not present

## 2015-12-24 DIAGNOSIS — Z89431 Acquired absence of right foot: Secondary | ICD-10-CM | POA: Diagnosis not present

## 2015-12-24 DIAGNOSIS — I739 Peripheral vascular disease, unspecified: Secondary | ICD-10-CM | POA: Insufficient documentation

## 2015-12-24 DIAGNOSIS — E039 Hypothyroidism, unspecified: Secondary | ICD-10-CM | POA: Diagnosis not present

## 2015-12-24 DIAGNOSIS — H540 Blindness, both eyes: Secondary | ICD-10-CM | POA: Insufficient documentation

## 2015-12-24 DIAGNOSIS — Z888 Allergy status to other drugs, medicaments and biological substances status: Secondary | ICD-10-CM | POA: Insufficient documentation

## 2015-12-24 DIAGNOSIS — H269 Unspecified cataract: Secondary | ICD-10-CM | POA: Diagnosis not present

## 2015-12-24 DIAGNOSIS — Z880 Allergy status to penicillin: Secondary | ICD-10-CM | POA: Diagnosis not present

## 2015-12-24 DIAGNOSIS — Z794 Long term (current) use of insulin: Secondary | ICD-10-CM | POA: Insufficient documentation

## 2015-12-24 DIAGNOSIS — L97519 Non-pressure chronic ulcer of other part of right foot with unspecified severity: Secondary | ICD-10-CM | POA: Diagnosis not present

## 2015-12-24 DIAGNOSIS — E11621 Type 2 diabetes mellitus with foot ulcer: Secondary | ICD-10-CM | POA: Diagnosis not present

## 2015-12-24 DIAGNOSIS — I252 Old myocardial infarction: Secondary | ICD-10-CM | POA: Diagnosis not present

## 2015-12-24 DIAGNOSIS — G894 Chronic pain syndrome: Secondary | ICD-10-CM | POA: Insufficient documentation

## 2015-12-24 DIAGNOSIS — E669 Obesity, unspecified: Secondary | ICD-10-CM | POA: Insufficient documentation

## 2015-12-24 LAB — POCT GLYCOSYLATED HEMOGLOBIN (HGB A1C): Hemoglobin A1C: 6.3

## 2015-12-24 LAB — CREATININE WITH EST GFR
Creat: 0.83 mg/dL (ref 0.50–1.05)
GFR, Est African American: >89 mL/min (ref >=60)
GFR, Est Non African American: 80 mL/min (ref >=60)

## 2015-12-24 LAB — GLUCOSE, POCT (MANUAL RESULT ENTRY): POC Glucose: 150 mg/dl — AB (ref 70–99)

## 2015-12-24 LAB — BUN: BUN: 31 mg/dL — AB (ref 7–25)

## 2015-12-24 MED ORDER — GABAPENTIN 300 MG PO CAPS: 300.0000 mg | ORAL_CAPSULE | Freq: Three times a day (TID) | ORAL | Status: DC

## 2015-12-24 MED ORDER — DULOXETINE HCL 30 MG PO CPEP: 30.0000 mg | ORAL_CAPSULE | Freq: Every day | ORAL | Status: DC

## 2015-12-24 MED ORDER — MUPIROCIN CALCIUM 2 % EX CREA: 1.0000 "application " | TOPICAL_CREAM | Freq: Two times a day (BID) | CUTANEOUS | Status: DC

## 2015-12-24 MED ORDER — ACETAMINOPHEN-CODEINE #3 300-30 MG PO TABS: 1.0000 | ORAL_TABLET | ORAL | Status: DC | PRN

## 2015-12-24 MED FILL — DULoxetine HCL 30 MG CPEP: 30 | 30 days supply | Qty: 30 | Fill #0

## 2015-12-24 MED FILL — GABAPENTIN 300 MG CAPSULE: 300 | 30 days supply | Qty: 90 | Fill #0

## 2015-12-24 MED FILL — ACETAMINOPHEN/COD #3 TABLET: 300-30 | 10 days supply | Qty: 60 | Fill #0

## 2015-12-24 NOTE — Progress Notes (Signed)
Patient ID: Debbie [name], female   DOB: 23-Dec-1960, 55 y.o.   MRN: [id number]   Debbie [name], is a 55 y.o. female  [id number]  [id number]  DOB - Jul 12, 1960  Chief Complaint  Patient presents with  . Follow-up    DM        Subjective:   Debbie [name] is a 55 y.o. female notes history of hypertension, type 2 diabetes mellitus, PTSD, blindness from bilateral cataract, status post transmetatarsal amputation of the right small toe and peripheral arterial disease here today for a follow up visit. She recently had evaluation with the vascular surgeon, a noninvasive physiologic vascular study of bilateral lower extremities was done which showed: 1. Very poor compressibility of the bilateral lower extremity arteries likely secondary to medial arterial sclerosis. This can falsely elevate the ankle brachial index. 2. Right ankle brachial index of 0.8 at rest suggests moderate peripheral arterial disease. 3. Left ankle brachial index of 1.0 at rest is within normal limits. 4. Suspect bilateral runoff (below the knee) disease. Patient is being scheduled for CT angiogram Ao + Bifem in July. She has no new complaints today except for ongoing pain. Her sister takes care of her and her medications. Blood sugar has been better controlled with the current regimen. She has no new ulcer. She has no fever. She has no urinary symptoms. Although her sister complained that patient has been showing increasing symptoms of depression by occasional crying episodes and moody. She denies any suicidal ideation or thoughts.   No problems updated.  ALLERGIES: Allergies  Allergen Reactions  . Hydrocortisone   . Penicillins Rash    PAST MEDICAL HISTORY: Past Medical History  Diagnosis Date  . Type II or unspecified type diabetes mellitus with unspecified complication, uncontrolled     x ~ 10 years  . Hypertension     x ~ 10 years  . Hypothyroidism   . Chest pain     a. ? MI in 2010, [place]->medically managed    . Diabetic foot ulcer ([place])   . Obesity   . Cataract   . Blind     MEDICATIONS AT HOME: Prior to Admission medications   Medication Sig Start Date End Date Taking? Authorizing Provider  acetaminophen (TYLENOL) 325 MG tablet Take 2 tablets (650 mg total) by mouth every 6 (six) hours as needed. 01/02/13  Yes Nita Sells, MD  acetaminophen-codeine (TYLENOL #3) 300-30 MG tablet Take 1 tablet by mouth every 4 (four) hours as needed. 12/24/15  Yes Tresa Garter, MD  canagliflozin (INVOKANA) 100 MG TABS tablet TAKE ONE (1) TABLET BY MOUTH EVERY DAY 02/05/15  Yes Tresa Garter, MD  clotrimazole (LOTRIMIN) 1 % cream APPLY TO AFFECTED AREAS TWICE DAILY 11/10/15  Yes Tresa Garter, MD  diazepam (VALIUM) 2 MG tablet Take 1 tablet (2 mg total) by mouth every 8 (eight) hours as needed for anxiety. 11/05/15  Yes Tresa Garter, MD  Docusate Sodium (DSS) 100 MG CAPS Take 100 mg by mouth 2 (two) times daily. 07/24/14  Yes Tresa Garter, MD  doxycycline (VIBRA-TABS) 100 MG tablet Take 1 tablet (100 mg total) by mouth 2 (two) times daily. 05/04/15  Yes Tresa Garter, MD  glucose blood (ACCU-CHEK AVIVA PLUS) test strip 1 each by Other route See admin instructions. Use as instructed 04/01/15  Yes Tresa Garter, MD  insulin aspart (NOVOLOG) 100 UNIT/ML injection SEE NOTES Dispense QH 1 month 07/23/15  Yes Willeen Niece, MD  insulin glargine (LANTUS)  100 UNIT/ML injection Inject 0.2 mLs (20 Units total) into the skin at bedtime. 07/23/15  Yes Willeen Niece, MD  Insulin Syringe-Needle U-100 (INSULIN SYRINGE 1CC/30GX5/16") 30G X 5/16" 1 ML MISC USE AS DIRECTED WITH LANTUS AND NOVOLOG INJECTIONS 10/08/15  Yes Tresa Garter, MD  Lancets (ACCU-CHEK SOFT TOUCH) lancets Use as instructed 01/09/15  Yes Tresa Garter, MD  lisinopril-hydrochlorothiazide (PRINZIDE,ZESTORETIC) 10-12.5 MG tablet Take 1 tablet by mouth daily. 07/23/15  Yes Willeen Niece, MD  loteprednol (LOTEMAX)  0.5 % ophthalmic suspension Place 1 drop into both eyes 2 (two) times daily. 01/09/14  Yes Tresa Garter, MD  propranolol ER (INDERAL LA) 60 MG 24 hr capsule Take 1 capsule (60 mg total) by mouth daily. 07/23/15  Yes Willeen Niece, MD  senna (SENOKOT) 8.6 MG TABS tablet Take 2 tablets (17.2 mg total) by mouth at bedtime as needed (for constipation). 07/23/15  Yes Willeen Niece, MD  sertraline (ZOLOFT) 50 MG tablet Take 1 tablet (50 mg total) by mouth daily. 02/05/15  Yes Tresa Garter, MD  sitaGLIPtin (JANUVIA) 100 MG tablet Take 1 tablet (100 mg total) by mouth daily. 11/11/15  Yes Tresa Garter, MD  triamcinolone cream (KENALOG) 0.1 % Apply 1 application topically 2 (two) times daily. 11/13/15  Yes Tresa Garter, MD  DULoxetine (CYMBALTA) 30 MG capsule Take 1 capsule (30 mg total) by mouth daily. 12/24/15   Tresa Garter, MD  gabapentin (NEURONTIN) 300 MG capsule Take 1 capsule (300 mg total) by mouth 3 (three) times daily. 12/24/15   Tresa Garter, MD  mupirocin cream (BACTROBAN) 2 % Apply 1 application topically 2 (two) times daily. 12/24/15   Tresa Garter, MD     Objective:   Filed Vitals:   12/24/15 1432  BP: 142/66  Pulse: 78  Temp: 98.2 F (36.8 C)  TempSrc: Oral  Resp: 18  Height: 5\' 6"  (1.676 m)  SpO2: 99%    Exam General appearance : Awake, alert, not in any distress. Speech Clear. Not toxic looking, on wheelchair, obese HEENT: Atraumatic and Normocephalic, pupils equally reactive to light and accomodation Neck: Supple, no JVD. No cervical lymphadenopathy.  Chest: Good air entry bilaterally, no added sounds  CVS: S1 S2 regular, no murmurs.  Abdomen: Bowel sounds present, Non tender and not distended with no gaurding, rigidity or rebound. Extremities: B/L Lower Ext shows no edema, both legs are warm to touch Neurology: Awake alert, and oriented X 3, CN II-XII intact, Non focal Skin: No Rash  Data Review Lab Results  Component Value Date     HGBA1C 6.3 12/24/2015   HGBA1C 13.50 02/05/2015   HGBA1C >15.0 07/24/2014     Assessment & Plan   1. Type 2 diabetes mellitus with complication, with long-term current use of insulin (HCC)  - Glucose (CBG) - POCT A1C much improved, 6.3% today - Continue current regimen  2. Chronic pain syndrome  - acetaminophen-codeine (TYLENOL #3) 300-30 MG tablet; Take 1 tablet by mouth every 4 (four) hours as needed.  Dispense: 60 tablet; Refill: 0  3. PAD (peripheral artery disease) (HCC)  Start Aspirin and Plavix Follow up with vascular surgery  4. PTSD (post-traumatic stress disorder)  - Start Cymbalta to help with depression and pain - DULoxetine (CYMBALTA) 30 MG capsule; Take 1 capsule (30 mg total) by mouth daily.  Dispense: 30 capsule; Refill: 3  5. Diabetes mellitus type 2 with peripheral artery disease (HCC)  - gabapentin (NEURONTIN) 300 MG  capsule; Take 1 capsule (300 mg total) by mouth 3 (three) times daily.  Dispense: 90 capsule; Refill: 3 - Ambulatory referral to Podiatry - mupirocin cream (BACTROBAN) 2 %; Apply 1 application topically 2 (two) times daily.  Dispense: 30 g; Refill: 3  Patient have been counseled extensively about nutrition and exercise  Return in about 4 weeks (around 01/21/2016) for Diabetes F/U, Follow up Pain and comorbidities, Follow up HTN.  Interpreter was used to communicate directly with patient for the entire encounter including providing detailed patient instructions.   The patient was given clear instructions to go to ER or return to medical center if symptoms don't improve, worsen or new problems develop. The patient verbalized understanding. The patient was told to call to get lab results if they haven't heard anything in the next week.   This note has been created with Surveyor, quantity. Any transcriptional errors are unintentional.    Angelica Chessman, MD, [place], Karilyn Cota, [place] and [place] Nazlini, [place]   12/24/2015, 4:37 PM

## 2015-12-24 NOTE — Patient Instructions (Signed)

## 2015-12-24 NOTE — Progress Notes (Signed)
Patient is here for FU DM  Patient complains of leg and foot pain being present scaled currently at a 7.  Patient has taken medication today and patient has eaten.

## 2015-12-25 ENCOUNTER — Other Ambulatory Visit: Payer: Self-pay | Admitting: Pharmacist

## 2015-12-25 MED ORDER — MUPIROCIN 2 % EX OINT: 1.0000 "application " | TOPICAL_OINTMENT | Freq: Two times a day (BID) | CUTANEOUS | Status: DC

## 2015-12-25 MED FILL — MUPIROCIN 2% OINTMENT: 2 | 14 days supply | Qty: 22 | Fill #0

## 2015-12-30 ENCOUNTER — Other Ambulatory Visit: Payer: Self-pay

## 2015-12-31 ENCOUNTER — Encounter: Payer: Self-pay | Admitting: Internal Medicine

## 2015-12-31 ENCOUNTER — Telehealth: Payer: Self-pay | Admitting: Internal Medicine

## 2015-12-31 DIAGNOSIS — E1165 Type 2 diabetes mellitus with hyperglycemia: Secondary | ICD-10-CM

## 2015-12-31 DIAGNOSIS — Z794 Long term (current) use of insulin: Secondary | ICD-10-CM

## 2015-12-31 DIAGNOSIS — I10 Essential (primary) hypertension: Secondary | ICD-10-CM

## 2015-12-31 MED ORDER — LISINOPRIL-HYDROCHLOROTHIAZIDE 10-12.5 MG PO TABS: 1.0000 | ORAL_TABLET | Freq: Every day | ORAL | Status: DC

## 2015-12-31 MED ORDER — INSULIN GLARGINE 100 UNIT/ML ~~LOC~~ SOLN: 20.0000 [IU] | Freq: Every day | SUBCUTANEOUS | Status: DC

## 2015-12-31 MED ORDER — ACCU-CHEK SOFT TOUCH LANCETS MISC: Status: AC

## 2015-12-31 NOTE — Telephone Encounter (Signed)
Sent refills for all 3 requested medications to [facility]

## 2015-12-31 NOTE — Telephone Encounter (Signed)
[facility] called requesting a refill on the following medications:  lisinopril-hydrochlorothiazide (PRINZIDE,ZESTORETIC) 10-12.5 MG tablet Lancets (ACCU-CHEK SOFT TOUCH) lancets insulin glargine (LANTUS) 100 UNIT/ML injection  Please f/u

## 2016-01-14 ENCOUNTER — Encounter (HOSPITAL_COMMUNITY): Payer: Self-pay

## 2016-01-14 ENCOUNTER — Ambulatory Visit (HOSPITAL_COMMUNITY)
Admission: RE | Admit: 2016-01-14 | Discharge: 2016-01-14 | Disposition: A | Discharge status: Home / Self Care | Payer: Medicaid Other | Source: Ambulatory Visit | Attending: Interventional Radiology | Admitting: Interventional Radiology

## 2016-01-14 ENCOUNTER — Other Ambulatory Visit: Payer: Self-pay

## 2016-01-14 DIAGNOSIS — I70203 Unspecified atherosclerosis of native arteries of extremities, bilateral legs: Secondary | ICD-10-CM | POA: Diagnosis not present

## 2016-01-14 DIAGNOSIS — Z89431 Acquired absence of right foot: Secondary | ICD-10-CM | POA: Diagnosis not present

## 2016-01-14 DIAGNOSIS — I771 Stricture of artery: Secondary | ICD-10-CM | POA: Insufficient documentation

## 2016-01-14 DIAGNOSIS — I739 Peripheral vascular disease, unspecified: Secondary | ICD-10-CM | POA: Diagnosis not present

## 2016-01-14 MED ORDER — IOPAMIDOL (ISOVUE-370) INJECTION 76%
100.0000 mL | Freq: Once | INTRAVENOUS | Status: AC | PRN
  Administered 2016-01-14: 100 mL via INTRAVENOUS

## 2016-01-14 MED ORDER — IOPAMIDOL (ISOVUE-370) INJECTION 76%
100.0000 mL | Freq: Once | INTRAVENOUS | Status: AC | PRN
  Administered 2016-01-14: 50 mL via INTRAVENOUS

## 2016-01-19 ENCOUNTER — Encounter: Payer: Self-pay | Admitting: Vascular Surgery

## 2016-01-22 ENCOUNTER — Other Ambulatory Visit: Payer: Self-pay

## 2016-01-22 ENCOUNTER — Inpatient Hospital Stay (HOSPITAL_COMMUNITY): Admission: RE | Admit: 2016-01-22 | Payer: Self-pay | Source: Ambulatory Visit

## 2016-01-22 ENCOUNTER — Ambulatory Visit (INDEPENDENT_AMBULATORY_CARE_PROVIDER_SITE_OTHER): Payer: Medicaid Other | Admitting: Vascular Surgery

## 2016-01-22 ENCOUNTER — Encounter: Payer: Self-pay | Admitting: Vascular Surgery

## 2016-01-22 DIAGNOSIS — F4310 Post-traumatic stress disorder, unspecified: Secondary | ICD-10-CM

## 2016-01-22 DIAGNOSIS — I739 Peripheral vascular disease, unspecified: Secondary | ICD-10-CM | POA: Diagnosis not present

## 2016-01-22 DIAGNOSIS — I70229 Atherosclerosis of native arteries of extremities with rest pain, unspecified extremity: Secondary | ICD-10-CM | POA: Insufficient documentation

## 2016-01-22 MED ORDER — MUPIROCIN 2 % EX OINT
1.0000 | TOPICAL_OINTMENT | Freq: Two times a day (BID) | CUTANEOUS | Status: DC
Start: 2016-01-22 — End: 2016-02-05

## 2016-01-22 MED ORDER — DULOXETINE HCL 30 MG PO CPEP: 30.0000 mg | ORAL_CAPSULE | Freq: Every day | ORAL | Status: DC

## 2016-01-22 MED ORDER — CLOPIDOGREL BISULFATE 75 MG PO TABS: 75.0000 mg | ORAL_TABLET | Freq: Every day | ORAL | Status: DC

## 2016-01-22 NOTE — Progress Notes (Addendum)
Referred by:  Tresa Garter, MD [place], [place] [address]  Reason for referral: right foot ischemia  History of Present Illness  Debbie [name] is a 55 y.o. (1961-04-04) female who presents with chief complaint: pain in both feet.  History was obtained from chart and via translator.  There are limits evident with translation service, so the history is limited. Onset of symptoms occurred is unknown but she has known significant DM related complications resulting in TMA of R foot.  Over the last ? 1-2 months, she has been developing worsening pain in the L foot with blistering around the L great toe.  The "color change" in the L great toe has been improving with Bactroban use.  Pt has multiple pain sx including her residual chronic pain in her R TMA and in her throat.  She seems to indicate the pain in her throat is worse than her poot.  Atherosclerotic risk factors include: uncontrolled IDDM, HTN.  The patient reportedly does not ambulate due to pain in her right foot.  Past Medical History  Diagnosis Date  . Type II or unspecified type diabetes mellitus with unspecified complication, uncontrolled     x ~ 10 years  . Hypertension     x ~ 10 years  . Hypothyroidism   . Chest pain     a. ? MI in 2010, [place]->medically managed  . Diabetic foot ulcer ([place])   . Obesity   . Cataract   . Blind     Past Surgical History  Procedure Laterality Date  . Back surgery    . Tubal ligation      Social History   Social History  . Marital Status: Single    Spouse Name: N/A  . Number of Children: N/A  . Years of Education: N/A   Occupational History  . Not on file.   Social History Main Topics  . Smoking status: Never Smoker   . Smokeless tobacco: Not on file  . Alcohol Use: No  . Drug Use: No  . Sexual Activity: No   Other Topics Concern  . Not on file   Social History Narrative   From [place], [place].  Moved to [place] to live with sister in April of 2014.  She  does not routinely exercise.    Family History  Problem Relation Age of Onset  . Diabetes Father     died in his 61's?  . Diabetes Father     died in her 75's?  . Diabetes Sister     alive and well.    Current Outpatient Prescriptions  Medication Sig Dispense Refill  . acetaminophen (TYLENOL) 325 MG tablet Take 2 tablets (650 mg total) by mouth every 6 (six) hours as needed.    Marland Kitchen acetaminophen-codeine (TYLENOL #3) 300-30 MG tablet Take 1 tablet by mouth every 4 (four) hours as needed. 60 tablet 0  . aspirin EC 81 MG tablet Take 81 mg by mouth daily.    . canagliflozin (INVOKANA) 100 MG TABS tablet TAKE ONE (1) TABLET BY MOUTH EVERY DAY 30 tablet 6  . clopidogrel (PLAVIX) 75 MG tablet Take 1 tablet (75 mg total) by mouth daily. 30 tablet 11  . clotrimazole (LOTRIMIN) 1 % cream APPLY TO AFFECTED AREAS TWICE DAILY 60 g 0  . diazepam (VALIUM) 2 MG tablet Take 1 tablet (2 mg total) by mouth every 8 (eight) hours as needed for anxiety. 60 tablet 0  . Docusate Sodium (DSS) 100  MG CAPS Take 100 mg by mouth 2 (two) times daily. 30 each 3  . doxycycline (VIBRA-TABS) 100 MG tablet Take 1 tablet (100 mg total) by mouth 2 (two) times daily. 20 tablet 0  . DULoxetine (CYMBALTA) 30 MG capsule Take 1 capsule (30 mg total) by mouth daily. 30 capsule 3  . gabapentin (NEURONTIN) 300 MG capsule Take 1 capsule (300 mg total) by mouth 3 (three) times daily. 90 capsule 3  . glucose blood (ACCU-CHEK AVIVA PLUS) test strip 1 each by Other route See admin instructions. Use as instructed 200 each 3  . insulin aspart (NOVOLOG) 100 UNIT/ML injection SEE NOTES Dispense QH 1 month 20 mL 2  . insulin glargine (LANTUS) 100 UNIT/ML injection Inject 0.2 mLs (20 Units total) into the skin at bedtime. 10 mL 3  . Insulin Syringe-Needle U-100 (INSULIN SYRINGE 1CC/30GX5/16") 30G X 5/16" 1 ML MISC USE AS DIRECTED WITH LANTUS AND NOVOLOG INJECTIONS 100 each 12  . Lancets (ACCU-CHEK SOFT TOUCH) lancets Use as instructed 100  each 12  . lisinopril-hydrochlorothiazide (PRINZIDE,ZESTORETIC) 10-12.5 MG tablet Take 1 tablet by mouth daily. 90 tablet 0  . loteprednol (LOTEMAX) 0.5 % ophthalmic suspension Place 1 drop into both eyes 2 (two) times daily. 15 mL 3  . mupirocin ointment (BACTROBAN) 2 % Apply 1 application topically 2 (two) times daily. 22 g 1  . propranolol ER (INDERAL LA) 60 MG 24 hr capsule Take 1 capsule (60 mg total) by mouth daily. 90 capsule 0  . senna (SENOKOT) 8.6 MG TABS tablet Take 2 tablets (17.2 mg total) by mouth at bedtime as needed (for constipation). 30 each 0  . sertraline (ZOLOFT) 50 MG tablet Take 1 tablet (50 mg total) by mouth daily. 90 tablet 3  . sitaGLIPtin (JANUVIA) 100 MG tablet Take 1 tablet (100 mg total) by mouth daily. 30 tablet 2  . triamcinolone cream (KENALOG) 0.1 % Apply 1 application topically 2 (two) times daily. 80 g 3  . ACCU-CHEK SOFTCLIX LANCETS lancets      No current facility-administered medications for this visit.    Allergies  Allergen Reactions  . Hydrocortisone   . Penicillins Rash     REVIEW OF SYSTEMS:  (Positives checked otherwise negative)  CARDIOVASCULAR:   [ ]  chest pain,  [ ]  chest pressure,  [ ]  palpitations,  [ ]  shortness of breath when laying flat,  [ ]  shortness of breath with exertion,   [x]  pain in feet when walking,  [x]  pain in feet when laying flat, [ ]  history of blood clot in veins (DVT),  [ ]  history of phlebitis,  [ ]  swelling in legs,  [ ]  varicose veins  PULMONARY:   [ ]  productive cough,  [ ]  asthma,  [ ]  wheezing  NEUROLOGIC:   [ ]  weakness in arms or legs,  [ ]  numbness in arms or legs,  [ ]  difficulty speaking or slurred speech,  [ ]  loss of vision in both eye,  [ ]  dizziness  HEMATOLOGIC:   [ ]  bleeding problems,  [ ]  problems with blood clotting too easily  MUSCULOSKEL:   [ ]  joint pain, [ ]  joint swelling  GASTROINTEST:   [ ]  vomiting blood,  [ ]  blood in stool     GENITOURINARY:   [ ]  burning  with urination,  [ ]  blood in urine  PSYCHIATRIC:   [ ]  history of major depression  INTEGUMENTARY:   [ ]  rashes,  [ ]  ulcers  CONSTITUTIONAL:   [ ]   fever,  [ ]  chills   For VQI Use Only  PRE-ADM LIVING: Home  AMB STATUS: Wheelchair  CAD Sx: History of MI, but no symptoms No MI within 6 months  PRIOR CHF: None  STRESS TEST: [x]  No, [ ]  Normal, [ ]  + ischemia, [ ]  + MI, [ ]  Both   Physical Examination  Filed Vitals:   01/22/16 1031 01/22/16 1032  BP: 140/75 140/67  Pulse: 100   Temp: 97.8 F (36.6 C)   TempSrc: Oral   Resp: 24    There is no weight on file to calculate BMI.  General: A&O x 3, WD, Obese,   Head: [place]/AT  Ear/Nose/Throat: Grossly appears to have intact hearing, nares w/o erythema or drainage, oropharynx w/o Erythema/Exudate, Mallampati score: 3  Eyes: unable to test  Neck: Supple, no nuchal rigidity, +palpable enlarged R thyroid lobe, some mild TTP to R lobe  Pulmonary: Sym exp, good air movt, CTAB, no rales, rhonchi, & wheezing  Cardiac: RRR, Nl S1, S2, no Murmurs, rubs or gallops  Vascular: Vessel Right Left  Radial Palpable Palpable  Ulnar Not Palpable Not Palpable  Brachial Palpable Palpable  Carotid Palpable, without bruit Palpable, without bruit  Aorta Not palpable due to pannus N/A  Femoral Not Palpable due pannus Not Palpable due to pannus  Popliteal Not palpable Not palpable  PT Not Palpable Not Palpable  DP Not palpable Not Palpable   Gastrointestinal: soft, NTND, -G/R, - HSM, - masses, - CVAT B  Musculoskeletal: unable to test, R TMA without gangrene or ulcers, L great toe with eythema without frank drainage, mild swelling of soft tissue in great toe, mild TTP, no ascending cellulitis  Neurologic: unable to test   Psychiatric: unable to test  Dermatologic: trunk not fully examined, no obvious rashes on arms or legs  Lymph : limited exam, no obvious cervical lymphadenopathy    Non-Invasive Vascular  Imaging  Outside ABI (Date: 12/23/15)  R: [place], DP: mono, PT: tri, TBI: NA  L: [place], DP: mono, PT: mono, TBI: 0.17  CTA Abd/pelvis with bilateral runoff (01/14/16)  No significant inflow, outflow or runoff disease.   Minimal atherosclerotic disease in the lower extremities.  Narrowing of the celiac artery trunk is compatible with median arcuate ligament compression.  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  I reviewed the patient's CTA, surprisingly I don't see much calcification in the arteries in the legs.  There also appears to be 3-vessel runoff on both sides.  These findings do not correlate with non-invasive studies or physical exam.   Outside Studies/Documentation 10 pages of outside documents were reviewed including: outside ABI, PCP report, CTA report.   Medical Decision Making  Debbie [name] is a 55 y.o. female who presents with: s/p R TMA, L foot great toe ischemia, IDDM with complications   There are significant cultural and language barriers with the care of this patient.  Despite the time spent with this patient, >1.5 hours between staff and MD, I still don't think the patient and the family have a good understanding of her disease process and what might need to be done for it.  There are inconsistencies within the physiologic studies that were done on the 12/23/15.  The calf is listed as non-compressible yet ABI are calculated.  The L ABI looks normal but the absolute toe pressure in the L great toe is 28, which is clearly abnormal.  The TBI usually is accurate even in diabetics, so its severe abnormality should  not be ignored.  These non-invasive results obviously do not correlate with the CTA findings.  It has been my experience that CTA usually undercalls disease, so in this patient that has already had a R TMA and a worrisome L great toe, I would proceed with the gold standard study, i.e. angiography.  Given the limb threatening status of this patient, I  recommend an aggressive work up including proceeding with an: Aortogram, Bilateral runoff and possible intervention.  I discussed with the patient the nature of angiographic procedures, especially the limited patencies of any endovascular intervention.  The patient is aware of that the risks of an angiographic procedure include but are not limited to: bleeding, infection, access site complications, embolization, rupture of treated vessel, dissection, possible need for emergent surgical intervention, and possible need for surgical procedures to treat the patient's pathology.  I discussed these possible complications with the patient and family, but I don't think they understand fully what I tried to communicate to them.  I think they understand that I feel that the angiography is necessary to determine what needs to be done with the L great toe.  The patient is aware of the risks and agrees to proceed.  The procedure is scheduled for: 27 JUL 17..  The patient is currently on a statin:  Lipitor.  The patient is currently on an anti-platelet: ASA and plavix.  I also renewed her Plavix, Cymbalata, and bactronban until she returns to her PCP.  Thank you for allowing [place] to participate in this patient's care.   Adele Barthel, MD [facility] Office: [phone] Pager: [phone]  01/22/2016, 3:18 PM

## 2016-01-23 ENCOUNTER — Emergency Department (HOSPITAL_COMMUNITY): Payer: Medicaid Other

## 2016-01-23 ENCOUNTER — Encounter (HOSPITAL_COMMUNITY): Payer: Self-pay

## 2016-01-23 ENCOUNTER — Emergency Department (HOSPITAL_COMMUNITY)
Admission: EM | Admit: 2016-01-23 | Discharge: 2016-01-23 | Disposition: A | Discharge status: Home / Self Care | Payer: Medicaid Other | Attending: Emergency Medicine | Admitting: Emergency Medicine

## 2016-01-23 DIAGNOSIS — Z794 Long term (current) use of insulin: Secondary | ICD-10-CM | POA: Diagnosis not present

## 2016-01-23 DIAGNOSIS — E039 Hypothyroidism, unspecified: Secondary | ICD-10-CM | POA: Diagnosis not present

## 2016-01-23 DIAGNOSIS — Z79899 Other long term (current) drug therapy: Secondary | ICD-10-CM | POA: Insufficient documentation

## 2016-01-23 DIAGNOSIS — E1136 Type 2 diabetes mellitus with diabetic cataract: Secondary | ICD-10-CM | POA: Insufficient documentation

## 2016-01-23 DIAGNOSIS — I10 Essential (primary) hypertension: Secondary | ICD-10-CM | POA: Diagnosis not present

## 2016-01-23 DIAGNOSIS — I739 Peripheral vascular disease, unspecified: Secondary | ICD-10-CM

## 2016-01-23 DIAGNOSIS — Z7982 Long term (current) use of aspirin: Secondary | ICD-10-CM | POA: Diagnosis not present

## 2016-01-23 DIAGNOSIS — M79672 Pain in left foot: Secondary | ICD-10-CM | POA: Diagnosis present

## 2016-01-23 DIAGNOSIS — E1151 Type 2 diabetes mellitus with diabetic peripheral angiopathy without gangrene: Secondary | ICD-10-CM | POA: Insufficient documentation

## 2016-01-23 DIAGNOSIS — Z7984 Long term (current) use of oral hypoglycemic drugs: Secondary | ICD-10-CM | POA: Insufficient documentation

## 2016-01-23 LAB — CBC WITH DIFFERENTIAL/PLATELET
Basophils Absolute: 0 10*3/uL (ref 0.0–0.1)
Basophils Relative: 0 %
Eosinophils Absolute: 0.5 10*3/uL (ref 0.0–0.7)
Eosinophils Relative: 5 %
HCT: 42.8 % (ref 36.0–46.0)
Hemoglobin: 13.5 g/dL (ref 12.0–15.0)
Lymphocytes Relative: 52 %
Lymphs Abs: 5.1 10*3/uL — ABNORMAL HIGH (ref 0.7–4.0)
MCH: 27 pg (ref 26.0–34.0)
MCHC: 31.5 g/dL (ref 30.0–36.0)
MCV: 85.6 fL (ref 78.0–100.0)
Monocytes Absolute: 0.4 10*3/uL (ref 0.1–1.0)
Monocytes Relative: 4 %
Neutro Abs: 3.9 10*3/uL (ref 1.7–7.7)
Neutrophils Relative %: 39 %
Platelets: 246 10*3/uL (ref 150–400)
RBC: 5 MIL/uL (ref 3.87–5.11)
RDW: 14.1 % (ref 11.5–15.5)
WBC: 10 10*3/uL (ref 4.0–10.5)

## 2016-01-23 LAB — COMPREHENSIVE METABOLIC PANEL
ALT: 42 U/L (ref 14–54)
AST: 23 U/L (ref 15–41)
Albumin: 3.1 g/dL — ABNORMAL LOW (ref 3.5–5.0)
Alkaline Phosphatase: 117 U/L (ref 38–126)
Anion gap: 7 (ref 5–15)
BUN: 21 mg/dL — ABNORMAL HIGH (ref 6–20)
CO2: 25 mmol/L (ref 22–32)
Calcium: 9.1 mg/dL (ref 8.9–10.3)
Chloride: 109 mmol/L (ref 101–111)
Creatinine, Ser: 0.81 mg/dL (ref 0.44–1.00)
GFR calc Af Amer: >60 mL/min (ref >60)
GFR calc non Af Amer: >60 mL/min (ref >60)
Glucose, Bld: 179 mg/dL — ABNORMAL HIGH (ref 65–99)
Potassium: 3.9 mmol/L (ref 3.5–5.1)
Sodium: 141 mmol/L (ref 135–145)
Total Bilirubin: 0.5 mg/dL (ref 0.3–1.2)
Total Protein: 6.4 g/dL — ABNORMAL LOW (ref 6.5–8.1)

## 2016-01-23 LAB — I-STAT CG4 LACTIC ACID, ED: Lactic Acid, Venous: 1.73 mmol/L (ref 0.5–1.9)

## 2016-01-23 LAB — CBG MONITORING, ED: Glucose-Capillary: 179 mg/dL — ABNORMAL HIGH (ref 65–99)

## 2016-01-23 NOTE — ED Notes (Signed)
Patient is a diabetic and had partial amputation of right foot and arrived with dressing to same. Now here for left foot pain, toes red and sister reports that her great toe had bleeding yesterday, patient has pain with same.  Saw foot doctor yesterday and when she returned home red

## 2016-01-23 NOTE — ED Notes (Signed)
Spoke with RN Angela Nevin about pt was to have gone to E47 , pt sister was to have gone to A7. RN Angela Nevin said ok to switch pt.

## 2016-01-23 NOTE — ED Notes (Signed)
Pt arabic speaker, language line interpreter used to provide dc instruction, pt verbalized understanding to instruction, pt legally blind states she can't sign at dc due to unable to see, pt encouraged to sign with and x on the computed and assisted. Pt taken to sister room on the ED on wheelchair until sister gets dc from ED.

## 2016-01-23 NOTE — ED Notes (Signed)
With the use of the interpreter phone.  Pt st's nothing is any different with left foot than when she was seen yesterday by vascular doctor.  Pt just st's that yesterday when she took her sock off there was some blood on her sock from the left great toe

## 2016-01-23 NOTE — ED Provider Notes (Signed)
CSN: [id number]     Arrival date & time 01/23/16  1613 History   First MD Initiated Contact with Patient 01/23/16 1752     Chief Complaint  Patient presents with  . foot redness      (Consider location/radiation/quality/duration/timing/severity/associated sxs/prior Treatment) Patient is a 55 y.o. female presenting with lower extremity pain. The history is provided by the patient (Patient states that she saw the vascular surgeon yesterday. She had little bit of blood on her toe today and came in to be seen).  Foot Pain This is a chronic problem. The current episode started more than 1 week ago. The problem occurs constantly. The problem has not changed since onset.Pertinent negatives include no chest pain, no abdominal pain and no headaches. Nothing aggravates the symptoms. Nothing relieves the symptoms.    Past Medical History  Diagnosis Date  . Type II or unspecified type diabetes mellitus with unspecified complication, uncontrolled     x ~ 10 years  . Hypertension     x ~ 10 years  . Hypothyroidism   . Chest pain     a. ? MI in 2010, [place]->medically managed  . Diabetic foot ulcer ([place])   . Obesity   . Cataract   . Blind    Past Surgical History  Procedure Laterality Date  . Back surgery    . Tubal ligation     Family History  Problem Relation Age of Onset  . Diabetes Father     died in his 22's?  . Diabetes Father     died in her 60's?  . Diabetes Sister     alive and well.   Social History  Substance Use Topics  . Smoking status: Never Smoker   . Smokeless tobacco: None  . Alcohol Use: No   OB History    No data available     Review of Systems  Constitutional: Negative for appetite change and fatigue.  HENT: Negative for congestion, ear discharge and sinus pressure.   Eyes: Negative for discharge.  Respiratory: Negative for cough.   Cardiovascular: Negative for chest pain.  Gastrointestinal: Negative for abdominal pain and diarrhea.  Genitourinary:  Negative for frequency and hematuria.  Musculoskeletal: Negative for back pain.  Skin: Negative for rash.  Neurological: Negative for seizures and headaches.  Psychiatric/Behavioral: Negative for hallucinations.      Allergies  Hydrocortisone and Penicillins  Home Medications   Prior to Admission medications   Medication Sig Start Date End Date Taking? Authorizing Provider  ACCU-CHEK SOFTCLIX LANCETS lancets  11/17/15   Historical Provider, MD  acetaminophen (TYLENOL) 325 MG tablet Take 2 tablets (650 mg total) by mouth every 6 (six) hours as needed. 01/02/13   Nita Sells, MD  acetaminophen-codeine (TYLENOL #3) 300-30 MG tablet Take 1 tablet by mouth every 4 (four) hours as needed. 12/24/15   Tresa Garter, MD  aspirin EC 81 MG tablet Take 81 mg by mouth daily.    Historical Provider, MD  canagliflozin (INVOKANA) 100 MG TABS tablet TAKE ONE (1) TABLET BY MOUTH EVERY DAY 02/05/15   Tresa Garter, MD  clopidogrel (PLAVIX) 75 MG tablet Take 1 tablet (75 mg total) by mouth daily. 01/22/16   Conrad Cimarron, MD  clotrimazole (LOTRIMIN) 1 % cream APPLY TO AFFECTED AREAS TWICE DAILY 11/10/15   Tresa Garter, MD  diazepam (VALIUM) 2 MG tablet Take 1 tablet (2 mg total) by mouth every 8 (eight) hours as needed for anxiety. 11/05/15   Olugbemiga E  Doreene Burke, MD  Docusate Sodium (DSS) 100 MG CAPS Take 100 mg by mouth 2 (two) times daily. 07/24/14   Tresa Garter, MD  doxycycline (VIBRA-TABS) 100 MG tablet Take 1 tablet (100 mg total) by mouth 2 (two) times daily. 05/04/15   Tresa Garter, MD  DULoxetine (CYMBALTA) 30 MG capsule Take 1 capsule (30 mg total) by mouth daily. 01/22/16   Conrad Lisbon, MD  gabapentin (NEURONTIN) 300 MG capsule Take 1 capsule (300 mg total) by mouth 3 (three) times daily. 12/24/15   Tresa Garter, MD  glucose blood (ACCU-CHEK AVIVA PLUS) test strip 1 each by Other route See admin instructions. Use as instructed 04/01/15   Tresa Garter, MD   insulin aspart (NOVOLOG) 100 UNIT/ML injection SEE NOTES Dispense QH 1 month 07/23/15   Willeen Niece, MD  insulin glargine (LANTUS) 100 UNIT/ML injection Inject 0.2 mLs (20 Units total) into the skin at bedtime. 12/31/15   Tresa Garter, MD  Insulin Syringe-Needle U-100 (INSULIN SYRINGE 1CC/30GX5/16") 30G X 5/16" 1 ML MISC USE AS DIRECTED WITH LANTUS AND NOVOLOG INJECTIONS 10/08/15   Tresa Garter, MD  Lancets (ACCU-CHEK SOFT TOUCH) lancets Use as instructed 12/31/15   Tresa Garter, MD  lisinopril-hydrochlorothiazide (PRINZIDE,ZESTORETIC) 10-12.5 MG tablet Take 1 tablet by mouth daily. 12/31/15   Tresa Garter, MD  loteprednol (LOTEMAX) 0.5 % ophthalmic suspension Place 1 drop into both eyes 2 (two) times daily. 01/09/14   Tresa Garter, MD  mupirocin ointment (BACTROBAN) 2 % Apply 1 application topically 2 (two) times daily. 01/22/16   Conrad Orme, MD  propranolol ER (INDERAL LA) 60 MG 24 hr capsule Take 1 capsule (60 mg total) by mouth daily. 07/23/15   Willeen Niece, MD  senna (SENOKOT) 8.6 MG TABS tablet Take 2 tablets (17.2 mg total) by mouth at bedtime as needed (for constipation). 07/23/15   Willeen Niece, MD  sertraline (ZOLOFT) 50 MG tablet Take 1 tablet (50 mg total) by mouth daily. 02/05/15   Tresa Garter, MD  sitaGLIPtin (JANUVIA) 100 MG tablet Take 1 tablet (100 mg total) by mouth daily. 11/11/15   Tresa Garter, MD  triamcinolone cream (KENALOG) 0.1 % Apply 1 application topically 2 (two) times daily. 11/13/15   Tresa Garter, MD   BP 148/61 mmHg  Pulse 79  Temp(Src) 99.1 F (37.3 C) (Oral)  Resp 18  SpO2 96%  LMP 05/21/2013 Physical Exam  Constitutional: She is oriented to person, place, and time. She appears well-developed.  HENT:  Head: Normocephalic.  Eyes: Conjunctivae and EOM are normal. No scleral icterus.  Neck: Neck supple. No thyromegaly present.  Cardiovascular: Normal rate and regular rhythm.  Exam reveals no gallop and no  friction rub.   No murmur heard. Pulmonary/Chest: No stridor. She has no wheezes. She has no rales. She exhibits no tenderness.  Abdominal: She exhibits no distension. There is no tenderness. There is no rebound.  Musculoskeletal: Normal range of motion. She exhibits no edema.  Mild redness to left great toe  Lymphadenopathy:    She has no cervical adenopathy.  Neurological: She is oriented to person, place, and time. She exhibits normal muscle tone. Coordination normal.  Skin: No rash noted. No erythema.  Psychiatric: She has a normal mood and affect. Her behavior is normal.    ED Course  Procedures (including critical care time) Labs Review Labs Reviewed  COMPREHENSIVE METABOLIC PANEL - Abnormal; Notable for the following:  Glucose, Bld 179 (*)    BUN 21 (*)    Total Protein 6.4 (*)    Albumin 3.1 (*)    All other components within normal limits  CBC WITH DIFFERENTIAL/PLATELET - Abnormal; Notable for the following:    Lymphs Abs 5.1 (*)    All other components within normal limits  CBG MONITORING, ED - Abnormal; Notable for the following:    Glucose-Capillary 179 (*)    All other components within normal limits  I-STAT CG4 LACTIC ACID, ED    Imaging Review Dg Foot Complete Left  01/23/2016  CLINICAL DATA:  55 year old female with left foot redness and pain. Initial encounter. Diabetes. EXAM: LEFT FOOT - COMPLETE 3+ VIEW COMPARISON:  10/26/2012 left foot series. FINDINGS: Extensive calcified peripheral vascular disease. No subcutaneous gas. Bone mineralization remains normal. Joint spaces and alignment remain within normal limits. Excess Re ossicle re- identified along the navicular. Chronic plantar degenerative spurring of the calcaneus. No acute osseous abnormality identified. No focal soft tissue wound identified. IMPRESSION: No acute osseous abnormality in the left foot. Advanced calcified peripheral vascular disease. Electronically Signed   By: Genevie Ann M.D.   On: 01/23/2016  19:29   I have personally reviewed and evaluated these images and lab results as part of my medical decision-making.   EKG Interpretation None      MDM   Final diagnoses:  PVD (peripheral vascular disease) ([place])    Patient is being worked up for peripheral vascular disease by the vascular surgeon. She was seen yesterday and he has arranged an angiogram. Exam is no different today than yesterday. Patient just came in because she saw a little blood on her toe. She will follow-up with the vascular surgeon as planned yesterday    Milton Ferguson, MD 01/23/16 [id number]

## 2016-01-23 NOTE — Discharge Instructions (Signed)
Follow up with your vascular surgeon as he planned yesterday

## 2016-01-28 ENCOUNTER — Ambulatory Visit
Admission: RE | Admit: 2016-01-28 | Discharge: 2016-01-28 | Disposition: A | Discharge status: Home / Self Care | Payer: Medicaid Other | Source: Ambulatory Visit | Attending: Interventional Radiology | Admitting: Interventional Radiology

## 2016-01-28 ENCOUNTER — Other Ambulatory Visit: Payer: Self-pay | Admitting: Pharmacist

## 2016-01-28 ENCOUNTER — Ambulatory Visit: Payer: Self-pay | Admitting: Internal Medicine

## 2016-01-28 DIAGNOSIS — E1165 Type 2 diabetes mellitus with hyperglycemia: Secondary | ICD-10-CM

## 2016-01-28 DIAGNOSIS — I739 Peripheral vascular disease, unspecified: Secondary | ICD-10-CM

## 2016-01-28 HISTORY — PX: IR GENERIC HISTORICAL: IMG1180011

## 2016-01-28 MED ORDER — GLUCOSE BLOOD VI STRP: ORAL_STRIP | Status: DC

## 2016-01-28 NOTE — Progress Notes (Signed)
Patient ID: Debbie [name], female   DOB: 1960-07-30, 55 y.o.   MRN: [id number]   Referring Physician(s): Jegede,Olugbemiga E  Chief Complaint: The patient is seen in follow up today for peripheral vascular disease  History of present illness:  This is a 55yo Debbie [name] female with a history of severe diabetes with chronic issues related to both of her feet.  She saw Dr. Laurence Ferrari in June regarding this, but had no imaging at the time to review.  She was sent for a CTA to further evaluate her aorta and extremities.  She was also referred to  Dr. Bridgett Larsson with vascular surgery for evaluation as well.  She returns to see [place] today in follow up for review of her CTA.    Past Medical History  Diagnosis Date  . Type II or unspecified type diabetes mellitus with unspecified complication, uncontrolled     x ~ 10 years  . Hypertension     x ~ 10 years  . Hypothyroidism   . Chest pain     a. ? MI in 2010, [place]->medically managed  . Diabetic foot ulcer ([place])   . Obesity   . Cataract   . Blind     Past Surgical History  Procedure Laterality Date  . Back surgery    . Tubal ligation      Allergies: Hydrocortisone and Penicillins  Medications: Prior to Admission medications   Medication Sig Start Date End Date Taking? Authorizing Provider  ACCU-CHEK SOFTCLIX LANCETS lancets  11/17/15   Historical Provider, MD  acetaminophen-codeine (TYLENOL #3) 300-30 MG tablet Take 1 tablet by mouth every 4 (four) hours as needed. Patient taking differently: Take 1 tablet by mouth every 4 (four) hours as needed for moderate pain.  12/24/15   Tresa Garter, MD  aspirin EC 81 MG tablet Take 81 mg by mouth daily.    Historical Provider, MD  canagliflozin (INVOKANA) 100 MG TABS tablet TAKE ONE (1) TABLET BY MOUTH EVERY DAY 02/05/15   Tresa Garter, MD  clopidogrel (PLAVIX) 75 MG tablet Take 1 tablet (75 mg total) by mouth daily. 01/22/16   Conrad De Smet, MD  clotrimazole (LOTRIMIN) 1 % cream APPLY TO  AFFECTED AREAS TWICE DAILY Patient taking differently: Apply 1 application topically 2 (two) times daily.  11/10/15   Tresa Garter, MD  diazepam (VALIUM) 2 MG tablet Take 1 tablet (2 mg total) by mouth every 8 (eight) hours as needed for anxiety. 11/05/15   Tresa Garter, MD  DULoxetine (CYMBALTA) 30 MG capsule Take 1 capsule (30 mg total) by mouth daily. 01/22/16   Conrad [place], MD  gabapentin (NEURONTIN) 300 MG capsule Take 1 capsule (300 mg total) by mouth 3 (three) times daily. 12/24/15   Tresa Garter, MD  glucose blood (ACCU-CHEK AVIVA PLUS) test strip Check blood sugar 3 times daily. 01/28/16   Tresa Garter, MD  insulin aspart (NOVOLOG) 100 UNIT/ML injection SEE NOTES Dispense QH 1 month Patient taking differently: Inject 10 Units into the skin 3 (three) times daily with meals.  07/23/15   Willeen Niece, MD  insulin glargine (LANTUS) 100 UNIT/ML injection Inject 0.2 mLs (20 Units total) into the skin at bedtime. 12/31/15   Tresa Garter, MD  Insulin Syringe-Needle U-100 (INSULIN SYRINGE 1CC/30GX5/16") 30G X 5/16" 1 ML MISC USE AS DIRECTED WITH LANTUS AND NOVOLOG INJECTIONS 10/08/15   Tresa Garter, MD  Lancets (ACCU-CHEK SOFT TOUCH) lancets Use as instructed 12/31/15   Olugbemiga E  Doreene Burke, MD  lisinopril-hydrochlorothiazide (PRINZIDE,ZESTORETIC) 10-12.5 MG tablet Take 1 tablet by mouth daily. 12/31/15   Tresa Garter, MD  loteprednol (LOTEMAX) 0.5 % ophthalmic suspension Place 1 drop into both eyes 2 (two) times daily. 01/09/14   Tresa Garter, MD  mupirocin ointment (BACTROBAN) 2 % Apply 1 application topically 2 (two) times daily. 01/22/16   Conrad Gl[place], MD  propranolol ER (INDERAL LA) 60 MG 24 hr capsule Take 1 capsule (60 mg total) by mouth daily. 07/23/15   Willeen Niece, MD  saxagliptin HCl (ONGLYZA) 2.5 MG TABS tablet Take 2.5 mg by mouth 2 (two) times daily.    Historical Provider, MD  senna (SENOKOT) 8.6 MG TABS tablet Take 2 tablets (17.2 mg  total) by mouth at bedtime as needed (for constipation). 07/23/15   Willeen Niece, MD  sitaGLIPtin (JANUVIA) 100 MG tablet Take 1 tablet (100 mg total) by mouth daily. 11/11/15   Tresa Garter, MD  triamcinolone cream (KENALOG) 0.1 % Apply 1 application topically 2 (two) times daily. 11/13/15   Tresa Garter, MD     Family History  Problem Relation Age of Onset  . Diabetes Father     died in his 69's?  . Diabetes Father     died in her 7's?  . Diabetes Sister     alive and well.    Social History   Social History  . Marital Status: Single    Spouse Name: N/A  . Number of Children: N/A  . Years of Education: N/A   Social History Main Topics  . Smoking status: Never Smoker   . Smokeless tobacco: Not on file  . Alcohol Use: No  . Drug Use: No  . Sexual Activity: No   Other Topics Concern  . Not on file   Social History Narrative   From [place], [place].  Moved to [place] to live with sister in April of 2014.  She does not routinely exercise.     Vital Signs: BP 135/60 mmHg  Pulse 87  Temp(Src) 98.3 F (36.8 C) (Oral)  Resp 15  SpO2 98%  LMP 05/21/2013  Physical Exam  Gen: 55 yo female sitting in her wheelchair in NAD Ext: both of her feet are wrapped up.  Evaluation of her left foot reveals some ischemic changes to her left great toe.  She also has ointment in between all of her toes as well.  Her right foot was not undressed.  Imaging: No results found.  Labs:  CBC:  Recent Labs  02/05/15 1219 11/05/15 1644 01/23/16 1638  WBC 8.1 11.2* 10.0  HGB 14.2 13.9 13.5  HCT 43.8 41.7 42.8  PLT 256 270 246    COAGS: No results for input(s): INR, APTT in the last 8760 hours.  BMP:  Recent Labs  02/05/15 1219 11/05/15 1644 12/23/15 1608 01/23/16 1638  NA 139 142  --  141  K 4.6 5.0  --  3.9  CL 103 105  --  109  CO2 24 25  --  25  GLUCOSE 402* 62*  --  179*  BUN 19 17 31* 21*  CALCIUM 9.7 9.1  --  9.1  CREATININE 0.62 0.76 0.83 0.81  GFRNONAA  >89 89 80 >60  GFRAA >89 >89 >89 >60    LIVER FUNCTION TESTS:  Recent Labs  02/05/15 1219 11/05/15 1644 01/23/16 1638  BILITOT 0.5 0.4 0.5  AST 16 19 23   ALT 30* 31* 42  ALKPHOS 138* 102  117  PROT 6.8 6.6 6.4*  ALBUMIN 4.0 3.7 3.1*    Assessment 1. Bilateral lower extremity pain The patient's CTA does not reveal any significant peripheral arterial disease that would require intervention from our standpoint.  She has pain in her lower extremities and given the fairly clear CTA, it is likely due to diabetic neuropathy.  We have discussed, through an interpretor, that she will not require any further treatment from our standpoint.  She is currently seeing Dr. Bridgett Larsson with Vascular surgery.  He has arranged for the patient to have an angiography.  We will defer further care, evaluation, and treatment to him.  This was discussed with the patient and her sister.  They seem to understand.    Follow up on an as needed basis.  Signed: Henreitta Cea 01/28/2016, 2:50 PM   Please refer to Dr. Katrinka Blazing attestation of this note for management and plan.

## 2016-02-01 ENCOUNTER — Ambulatory Visit (HOSPITAL_COMMUNITY)
Admission: RE | Admit: 2016-02-01 | Discharge: 2016-02-01 | Disposition: A | Discharge status: Home / Self Care | Payer: Medicaid Other | Source: Ambulatory Visit | Attending: Vascular Surgery | Admitting: Vascular Surgery

## 2016-02-01 ENCOUNTER — Encounter (HOSPITAL_COMMUNITY)
Admission: RE | Disposition: A | Discharge status: Home / Self Care | Payer: Self-pay | Source: Ambulatory Visit | Attending: Vascular Surgery

## 2016-02-01 ENCOUNTER — Telehealth: Payer: Self-pay | Admitting: Vascular Surgery

## 2016-02-01 ENCOUNTER — Other Ambulatory Visit: Payer: Self-pay | Admitting: *Deleted

## 2016-02-01 DIAGNOSIS — I252 Old myocardial infarction: Secondary | ICD-10-CM | POA: Diagnosis not present

## 2016-02-01 DIAGNOSIS — M79672 Pain in left foot: Secondary | ICD-10-CM | POA: Insufficient documentation

## 2016-02-01 DIAGNOSIS — E1136 Type 2 diabetes mellitus with diabetic cataract: Secondary | ICD-10-CM | POA: Diagnosis not present

## 2016-02-01 DIAGNOSIS — Z7982 Long term (current) use of aspirin: Secondary | ICD-10-CM | POA: Diagnosis not present

## 2016-02-01 DIAGNOSIS — H540 Blindness, both eyes: Secondary | ICD-10-CM | POA: Insufficient documentation

## 2016-02-01 DIAGNOSIS — Z880 Allergy status to penicillin: Secondary | ICD-10-CM | POA: Insufficient documentation

## 2016-02-01 DIAGNOSIS — E039 Hypothyroidism, unspecified: Secondary | ICD-10-CM | POA: Diagnosis not present

## 2016-02-01 DIAGNOSIS — M79671 Pain in right foot: Secondary | ICD-10-CM | POA: Diagnosis not present

## 2016-02-01 DIAGNOSIS — Z7902 Long term (current) use of antithrombotics/antiplatelets: Secondary | ICD-10-CM | POA: Diagnosis not present

## 2016-02-01 DIAGNOSIS — E669 Obesity, unspecified: Secondary | ICD-10-CM | POA: Diagnosis not present

## 2016-02-01 DIAGNOSIS — Z6836 Body mass index (BMI) 36.0-36.9, adult: Secondary | ICD-10-CM | POA: Diagnosis not present

## 2016-02-01 DIAGNOSIS — G8929 Other chronic pain: Secondary | ICD-10-CM | POA: Diagnosis not present

## 2016-02-01 DIAGNOSIS — Z794 Long term (current) use of insulin: Secondary | ICD-10-CM | POA: Diagnosis not present

## 2016-02-01 DIAGNOSIS — Z833 Family history of diabetes mellitus: Secondary | ICD-10-CM | POA: Diagnosis not present

## 2016-02-01 DIAGNOSIS — E11628 Type 2 diabetes mellitus with other skin complications: Secondary | ICD-10-CM | POA: Diagnosis present

## 2016-02-01 DIAGNOSIS — I10 Essential (primary) hypertension: Secondary | ICD-10-CM | POA: Insufficient documentation

## 2016-02-01 DIAGNOSIS — L97529 Non-pressure chronic ulcer of other part of left foot with unspecified severity: Secondary | ICD-10-CM | POA: Insufficient documentation

## 2016-02-01 DIAGNOSIS — L089 Local infection of the skin and subcutaneous tissue, unspecified: Secondary | ICD-10-CM

## 2016-02-01 DIAGNOSIS — E11621 Type 2 diabetes mellitus with foot ulcer: Secondary | ICD-10-CM | POA: Diagnosis not present

## 2016-02-01 DIAGNOSIS — M869 Osteomyelitis, unspecified: Secondary | ICD-10-CM

## 2016-02-01 DIAGNOSIS — I998 Other disorder of circulatory system: Secondary | ICD-10-CM

## 2016-02-01 HISTORY — PX: PERIPHERAL VASCULAR CATHETERIZATION: SHX172C

## 2016-02-01 LAB — POCT I-STAT, CHEM 8
BUN: 26 mg/dL — AB (ref 6–20)
CALCIUM ION: 1.23 mmol/L (ref 1.13–1.30)
Chloride: 105 mmol/L (ref 101–111)
Creatinine, Ser: 0.8 mg/dL (ref 0.44–1.00)
Glucose, Bld: 153 mg/dL — ABNORMAL HIGH (ref 65–99)
HEMATOCRIT: 45 % (ref 36.0–46.0)
Hemoglobin: 15.3 g/dL — ABNORMAL HIGH (ref 12.0–15.0)
Potassium: 5.5 mmol/L — ABNORMAL HIGH (ref 3.5–5.1)
SODIUM: 144 mmol/L (ref 135–145)
TCO2: 29 mmol/L (ref 0–100)

## 2016-02-01 LAB — GLUCOSE, CAPILLARY
Glucose-Capillary: 125 mg/dL — ABNORMAL HIGH (ref 65–99)
Glucose-Capillary: 125 mg/dL — ABNORMAL HIGH (ref 65–99)

## 2016-02-01 SURGERY — ABDOMINAL AORTOGRAM W/LOWER EXTREMITY
Anesthesia: LOCAL

## 2016-02-01 MED ORDER — SODIUM CHLORIDE 0.9 % IV SOLN: 1.0000 mL/kg/h | INTRAVENOUS | Status: DC

## 2016-02-01 MED ORDER — LIDOCAINE HCL (PF) 1 % IJ SOLN
INTRAMUSCULAR | Status: DC | PRN
  Administered 2016-02-01: 23 mL via SUBCUTANEOUS

## 2016-02-01 MED ORDER — OXYCODONE-ACETAMINOPHEN 5-325 MG PO TABS: 1.0000 | ORAL_TABLET | Freq: Four times a day (QID) | ORAL | Status: DC | PRN

## 2016-02-01 MED ORDER — CIPROFLOXACIN HCL 500 MG PO TABS: 500.0000 mg | ORAL_TABLET | Freq: Two times a day (BID) | ORAL | 0 refills | Status: DC

## 2016-02-01 MED ORDER — HEPARIN (PORCINE) IN NACL 2-0.9 UNIT/ML-% IJ SOLN
INTRAMUSCULAR | Status: AC
Start: 2016-02-01 — End: 2016-02-01
  Filled 2016-02-01: qty 1000

## 2016-02-01 MED ORDER — LIDOCAINE HCL (PF) 1 % IJ SOLN
INTRAMUSCULAR | Status: AC
  Filled 2016-02-01: qty 30

## 2016-02-01 MED ORDER — MIDAZOLAM HCL 2 MG/2ML IJ SOLN
INTRAMUSCULAR | Status: DC | PRN
  Administered 2016-02-01: 1 mg via INTRAVENOUS

## 2016-02-01 MED ORDER — MORPHINE SULFATE (PF) 2 MG/ML IV SOLN: 2.0000 mg | INTRAVENOUS | Status: DC | PRN

## 2016-02-01 MED ORDER — SODIUM CHLORIDE 0.9 % IV SOLN
INTRAVENOUS | Status: DC
  Administered 2016-02-01: 09:00:00 via INTRAVENOUS

## 2016-02-01 MED ORDER — FENTANYL CITRATE (PF) 100 MCG/2ML IJ SOLN
INTRAMUSCULAR | Status: DC | PRN
  Administered 2016-02-01: 50 ug via INTRAVENOUS

## 2016-02-01 MED ORDER — IODIXANOL 320 MG/ML IV SOLN
INTRAVENOUS | Status: DC | PRN
  Administered 2016-02-01: 208 mL via INTRA_ARTERIAL

## 2016-02-01 MED ORDER — HEPARIN (PORCINE) IN NACL 2-0.9 UNIT/ML-% IJ SOLN
INTRAMUSCULAR | Status: DC | PRN
  Administered 2016-02-01: 1000 mL via INTRA_ARTERIAL

## 2016-02-01 MED ORDER — MIDAZOLAM HCL 2 MG/2ML IJ SOLN
INTRAMUSCULAR | Status: AC
  Filled 2016-02-01: qty 2

## 2016-02-01 MED ORDER — FENTANYL CITRATE (PF) 100 MCG/2ML IJ SOLN
INTRAMUSCULAR | Status: AC
  Filled 2016-02-01: qty 2

## 2016-02-01 MED ORDER — OXYCODONE-ACETAMINOPHEN 5-325 MG PO TABS
ORAL_TABLET | ORAL | Status: AC
  Administered 2016-02-01: 1
  Filled 2016-02-01: qty 1

## 2016-02-01 SURGICAL SUPPLY — 11 items
CATH OMNI FLUSH 5F 65CM (CATHETERS) ×2 IMPLANT
COVER PRB 48X5XTLSCP FOLD TPE (BAG) ×1 IMPLANT
COVER PROBE 5X48 (BAG) ×1
KIT MICROINTRODUCER STIFF 5F (SHEATH) ×2 IMPLANT
KIT PV (KITS) ×2 IMPLANT
SHEATH PINNACLE 5F 10CM (SHEATH) ×2 IMPLANT
SYR MEDRAD MARK V 150ML (SYRINGE) ×2 IMPLANT
TRANSDUCER W/STOPCOCK (MISCELLANEOUS) ×2 IMPLANT
TRAY PV CATH (CUSTOM PROCEDURE TRAY) ×2 IMPLANT
WIRE BENTSON .035X145CM (WIRE) ×2 IMPLANT
WIRE TORQFLEX AUST .018X40CM (WIRE) ×2 IMPLANT

## 2016-02-01 NOTE — H&P (View-Only) (Signed)
Referred by:  Tresa Garter, MD [address], [place] [id number]  Reason for referral: right foot ischemia  History of Present Illness  Debbie [name] is a 55 y.o. (Nov 08, 1960) female who presents with chief complaint: pain in both feet.  History was obtained from chart and via translator.  There are limits evident with translation service, so the history is limited. Onset of symptoms occurred is unknown but she has known significant DM related complications resulting in TMA of R foot.  Over the last ? 1-2 months, she has been developing worsening pain in the L foot with blistering around the L great toe.  The "color change" in the L great toe has been improving with Bactroban use.  Pt has multiple pain sx including her residual chronic pain in her R TMA and in her throat.  She seems to indicate the pain in her throat is worse than her poot.  Atherosclerotic risk factors include: uncontrolled IDDM, HTN.  The patient reportedly does not ambulate due to pain in her right foot.  Past Medical History  Diagnosis Date  . Type II or unspecified type diabetes mellitus with unspecified complication, uncontrolled     x ~ 10 years  . Hypertension     x ~ 10 years  . Hypothyroidism   . Chest pain     a. ? MI in 2010, [place]->medically managed  . Diabetic foot ulcer ([place])   . Obesity   . Cataract   . Blind     Past Surgical History  Procedure Laterality Date  . Back surgery    . Tubal ligation      Social History   Social History  . Marital Status: Single    Spouse Name: N/A  . Number of Children: N/A  . Years of Education: N/A   Occupational History  . Not on file.   Social History Main Topics  . Smoking status: Never Smoker   . Smokeless tobacco: Not on file  . Alcohol Use: No  . Drug Use: No  . Sexual Activity: No   Other Topics Concern  . Not on file   Social History Narrative   From [place], [place].  Moved to [place] to live with sister in April of 2014.  She  does not routinely exercise.    Family History  Problem Relation Age of Onset  . Diabetes Father     died in his 5's?  . Diabetes Father     died in her 27's?  . Diabetes Sister     alive and well.    Current Outpatient Prescriptions  Medication Sig Dispense Refill  . acetaminophen (TYLENOL) 325 MG tablet Take 2 tablets (650 mg total) by mouth every 6 (six) hours as needed.    Marland Kitchen acetaminophen-codeine (TYLENOL #3) 300-30 MG tablet Take 1 tablet by mouth every 4 (four) hours as needed. 60 tablet 0  . aspirin EC 81 MG tablet Take 81 mg by mouth daily.    . canagliflozin (INVOKANA) 100 MG TABS tablet TAKE ONE (1) TABLET BY MOUTH EVERY DAY 30 tablet 6  . clopidogrel (PLAVIX) 75 MG tablet Take 1 tablet (75 mg total) by mouth daily. 30 tablet 11  . clotrimazole (LOTRIMIN) 1 % cream APPLY TO AFFECTED AREAS TWICE DAILY 60 g 0  . diazepam (VALIUM) 2 MG tablet Take 1 tablet (2 mg total) by mouth every 8 (eight) hours as needed for anxiety. 60 tablet 0  . Docusate Sodium (DSS) 100  MG CAPS Take 100 mg by mouth 2 (two) times daily. 30 each 3  . doxycycline (VIBRA-TABS) 100 MG tablet Take 1 tablet (100 mg total) by mouth 2 (two) times daily. 20 tablet 0  . DULoxetine (CYMBALTA) 30 MG capsule Take 1 capsule (30 mg total) by mouth daily. 30 capsule 3  . gabapentin (NEURONTIN) 300 MG capsule Take 1 capsule (300 mg total) by mouth 3 (three) times daily. 90 capsule 3  . glucose blood (ACCU-CHEK AVIVA PLUS) test strip 1 each by Other route See admin instructions. Use as instructed 200 each 3  . insulin aspart (NOVOLOG) 100 UNIT/ML injection SEE NOTES Dispense QH 1 month 20 mL 2  . insulin glargine (LANTUS) 100 UNIT/ML injection Inject 0.2 mLs (20 Units total) into the skin at bedtime. 10 mL 3  . Insulin Syringe-Needle U-100 (INSULIN SYRINGE 1CC/30GX5/16") 30G X 5/16" 1 ML MISC USE AS DIRECTED WITH LANTUS AND NOVOLOG INJECTIONS 100 each 12  . Lancets (ACCU-CHEK SOFT TOUCH) lancets Use as instructed 100  each 12  . lisinopril-hydrochlorothiazide (PRINZIDE,ZESTORETIC) 10-12.5 MG tablet Take 1 tablet by mouth daily. 90 tablet 0  . loteprednol (LOTEMAX) 0.5 % ophthalmic suspension Place 1 drop into both eyes 2 (two) times daily. 15 mL 3  . mupirocin ointment (BACTROBAN) 2 % Apply 1 application topically 2 (two) times daily. 22 g 1  . propranolol ER (INDERAL LA) 60 MG 24 hr capsule Take 1 capsule (60 mg total) by mouth daily. 90 capsule 0  . senna (SENOKOT) 8.6 MG TABS tablet Take 2 tablets (17.2 mg total) by mouth at bedtime as needed (for constipation). 30 each 0  . sertraline (ZOLOFT) 50 MG tablet Take 1 tablet (50 mg total) by mouth daily. 90 tablet 3  . sitaGLIPtin (JANUVIA) 100 MG tablet Take 1 tablet (100 mg total) by mouth daily. 30 tablet 2  . triamcinolone cream (KENALOG) 0.1 % Apply 1 application topically 2 (two) times daily. 80 g 3  . ACCU-CHEK SOFTCLIX LANCETS lancets      No current facility-administered medications for this visit.    Allergies  Allergen Reactions  . Hydrocortisone   . Penicillins Rash     REVIEW OF SYSTEMS:  (Positives checked otherwise negative)  CARDIOVASCULAR:   [ ]  chest pain,  [ ]  chest pressure,  [ ]  palpitations,  [ ]  shortness of breath when laying flat,  [ ]  shortness of breath with exertion,   [x]  pain in feet when walking,  [x]  pain in feet when laying flat, [ ]  history of blood clot in veins (DVT),  [ ]  history of phlebitis,  [ ]  swelling in legs,  [ ]  varicose veins  PULMONARY:   [ ]  productive cough,  [ ]  asthma,  [ ]  wheezing  NEUROLOGIC:   [ ]  weakness in arms or legs,  [ ]  numbness in arms or legs,  [ ]  difficulty speaking or slurred speech,  [ ]  loss of vision in both eye,  [ ]  dizziness  HEMATOLOGIC:   [ ]  bleeding problems,  [ ]  problems with blood clotting too easily  MUSCULOSKEL:   [ ]  joint pain, [ ]  joint swelling  GASTROINTEST:   [ ]  vomiting blood,  [ ]  blood in stool     GENITOURINARY:   [ ]  burning  with urination,  [ ]  blood in urine  PSYCHIATRIC:   [ ]  history of major depression  INTEGUMENTARY:   [ ]  rashes,  [ ]  ulcers  CONSTITUTIONAL:   [ ]   fever,  [ ]  chills   For VQI Use Only  PRE-ADM LIVING: Home  AMB STATUS: Wheelchair  CAD Sx: History of MI, but no symptoms No MI within 6 months  PRIOR CHF: None  STRESS TEST: [x]  No, [ ]  Normal, [ ]  + ischemia, [ ]  + MI, [ ]  Both   Physical Examination  Filed Vitals:   01/22/16 1031 01/22/16 1032  BP: 140/75 140/67  Pulse: 100   Temp: 97.8 F (36.6 C)   TempSrc: Oral   Resp: 24    There is no weight on file to calculate BMI.  General: A&O x 3, WD, Obese,   Head: [place]/AT  Ear/Nose/Throat: Grossly appears to have intact hearing, nares w/o erythema or drainage, oropharynx w/o Erythema/Exudate, Mallampati score: 3  Eyes: unable to test  Neck: Supple, no nuchal rigidity, +palpable enlarged R thyroid lobe, some mild TTP to R lobe  Pulmonary: Sym exp, good air movt, CTAB, no rales, rhonchi, & wheezing  Cardiac: RRR, Nl S1, S2, no Murmurs, rubs or gallops  Vascular: Vessel Right Left  Radial Palpable Palpable  Ulnar Not Palpable Not Palpable  Brachial Palpable Palpable  Carotid Palpable, without bruit Palpable, without bruit  Aorta Not palpable due to pannus N/A  Femoral Not Palpable due pannus Not Palpable due to pannus  Popliteal Not palpable Not palpable  PT Not Palpable Not Palpable  DP Not palpable Not Palpable   Gastrointestinal: soft, NTND, -G/R, - HSM, - masses, - CVAT B  Musculoskeletal: unable to test, R TMA without gangrene or ulcers, L great toe with eythema without frank drainage, mild swelling of soft tissue in great toe, mild TTP, no ascending cellulitis  Neurologic: unable to test   Psychiatric: unable to test  Dermatologic: trunk not fully examined, no obvious rashes on arms or legs  Lymph : limited exam, no obvious cervical lymphadenopathy    Non-Invasive Vascular  Imaging  Outside ABI (Date: 12/23/15)  R: Spearman, DP: mono, PT: tri, TBI: NA  L: , DP: mono, PT: mono, TBI: 0.17  CTA Abd/pelvis with bilateral runoff (01/14/16)  No significant inflow, outflow or runoff disease.   Minimal atherosclerotic disease in the lower extremities.  Narrowing of the celiac artery trunk is compatible with median arcuate ligament compression.  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  I reviewed the patient's CTA, surprisingly I don't see much calcification in the arteries in the legs.  There also appears to be 3-vessel runoff on both sides.  These findings do not correlate with non-invasive studies or physical exam.   Outside Studies/Documentation 10 pages of outside documents were reviewed including: outside ABI, PCP report, CTA report.   Medical Decision Making  Debbie [name] is a 55 y.o. female who presents with: s/p R TMA, L foot great toe ischemia, IDDM with complications   There are significant cultural and language barriers with the care of this patient.  Despite the time spent with this patient, >1.5 hours between staff and MD, I still don't think the patient and the family have a good understanding of her disease process and what might need to be done for it.  There are inconsistencies within the physiologic studies that were done on the 12/23/15.  The calf is listed as non-compressible yet ABI are calculated.  The L ABI looks normal but the absolute toe pressure in the L great toe is 28, which is clearly abnormal.  The TBI usually is accurate even in diabetics, so its severe abnormality should  not be ignored.  These non-invasive results obviously do not correlate with the CTA findings.  It has been my experience that CTA usually undercalls disease, so in this patient that has already had a R TMA and a worrisome L great toe, I would proceed with the gold standard study, i.e. angiography.  Given the limb threatening status of this patient, I  recommend an aggressive work up including proceeding with an: Aortogram, Bilateral runoff and possible intervention.  I discussed with the patient the nature of angiographic procedures, especially the limited patencies of any endovascular intervention.  The patient is aware of that the risks of an angiographic procedure include but are not limited to: bleeding, infection, access site complications, embolization, rupture of treated vessel, dissection, possible need for emergent surgical intervention, and possible need for surgical procedures to treat the patient's pathology.  I discussed these possible complications with the patient and family, but I don't think they understand fully what I tried to communicate to them.  I think they understand that I feel that the angiography is necessary to determine what needs to be done with the L great toe.  The patient is aware of the risks and agrees to proceed.  The procedure is scheduled for: 27 JUL 17..  The patient is currently on a statin:  Lipitor.  The patient is currently on an anti-platelet: ASA and plavix.  I also renewed her Plavix, Cymbalata, and bactronban until she returns to her PCP.  Thank you for allowing [place] to participate in this patient's care.   Adele Barthel, MD [facility] Office: [phone] Pager: [phone]  01/22/2016, 3:18 PM

## 2016-02-01 NOTE — Progress Notes (Signed)
Site area: rt groin Site Prior to Removal:  Level 0 Pressure Applied For:  20 minutes Manual:   yes Patient Status During Pull:  stable Post Pull Site:  Level  0 Post Pull Instructions Given:  Yes w/translator Post Pull Pulses Present: yes Dressing Applied:  tegaderm Bedrest begins @ [id number] Comments:

## 2016-02-01 NOTE — Op Note (Signed)
OPERATIVE NOTE   PROCEDURE: 1.  Right common femoral artery cannulation under ultrasound guidance 2.  Placement of catheter in aorta 3.  Aortogram 4.  Conscious sedation for 50 minutes 5.  Bilateral leg runoff  PRE-OPERATIVE DIAGNOSIS: left great toe infection, ischemic great toe  POST-OPERATIVE DIAGNOSIS: same as above   SURGEON: Adele Barthel, MD  ANESTHESIA: conscious sedation  ESTIMATED BLOOD LOSS: 30 cc  CONTRAST: 208 cc  FINDING(S):  Aorta: patent  Superior mesenteric artery: not seen Celiac artery: not seen   Right Left  RA patent patent  CIA patent patent  EIA patent patent  IIA patent patent  CFA patent patent  SFA patent patent  PFA patent patent  Pop patent patent  Trif patent patent  AT Patent, runoff to foot Patent, runoff to foot  Pero Patent, miniscule distally Patent, miniscule distally  PT Patent, runoff to foot Patent, runoff to foot  Feet TMA Digital artery to great toe visualized, diseased arch vessels, diseased digital arteries   SPECIMEN(S):  none  INDICATIONS:   Debbie [name] is a 55 y.o. female who presents with ischemic left great toe with some infection.  Her CTA and BLE ABI did not agree with her physical findings, so I recommended angiography in this patient with diabetes.  The patient presents for: aortogram, bilateral leg runoff.  I discussed with the patient the nature of angiographic procedures, especially the limited patencies of any endovascular intervention.  The patient is aware of that the risks of an angiographic procedure include but are not limited to: bleeding, infection, access site complications, renal failure, embolization, rupture of vessel, dissection, possible need for emergent surgical intervention, possible need for surgical procedures to treat the patient's pathology, and stroke and death.  The patient is aware of the risks and agrees to proceed.  DESCRIPTION: After full informed consent was obtained from the patient,  the patient was brought back to the angiography suite.  The patient was placed supine upon the angiography table and connected to cardiopulmonary monitoring equipment.  The patient was then given conscious sedation, the amounts of which are documented in the patient's chart.  A circulating radiologic technician maintained continuous monitoring of the patient's cardiopulmonary status.  Additionally, the control room radiologic technician provided backup monitoring throughout the procedure.  The patient was prepped and drape in the standard fashion for an angiographic procedure.  At this point, attention was turned to the right groin.  Under ultrasound guidance, the subcutaneous tissue surrounding the right common femoral artery was anesthesized with 1% lidocaine with epinephrine.  The artery was difficult to cannulate due to calcified anterior plaque.  Eventually the artery was cannulated with a micropuncture needle.  The microwire was advanced into the iliac arterial system.  The needle was exchanged for a microsheath, which was loaded into the common femoral artery over the wire.  The microwire was exchanged for a [facility] wire which was advanced into the aorta.  The microsheath was then exchanged for a 5-Fr sheath which was loaded into the common femoral artery.  The Omniflush catheter was then loaded over the wire up to the level of L1.  The catheter was connected to the power injector circuit.  After de-airring and de-clotting the circuit, a power injector aortogram was completed.  I pulled the catheter down to the distal aorta.  An automated bilateral leg runoff was completed.  The findings are listed above.  I repeated a dedicated injection at the level of the feet.  Due to  problems with catheter position, I had to repeat this twice.    Based on the images, this patient has digital artery disease in her left foot related to her diabetes.  No endovascular or surgical intervention is  indicated.   COMPLICATIONS: none  CONDITION: stable   Adele Barthel, MD [facility] Office: [phone] Pager: [phone]  02/01/2016, 12:21 PM

## 2016-02-01 NOTE — Interval H&P Note (Signed)
[facility]  History and Physical Update  The patient was interviewed and re-examined.  The patient's previous History and Physical has been reviewed and is unchanged from my consult.  There is no change in the plan of care: aortogram, bilateral leg runoff, and possible intervention.     I discussed with the patient the nature of angiographic procedures, especially the limited patencies of any endovascular intervention.    The patient is aware of that the risks of an angiographic procedure include but are not limited to: bleeding, infection, access site complications, renal failure, embolization, rupture of vessel, dissection, arteriovenous fistula, possible need for emergent surgical intervention, possible need for surgical procedures to treat the patient's pathology, anaphylactic reaction to contrast, and stroke and death.    The patient is aware of the risks and agrees to proceed.  BMET    Component Value Date/Time   NA 144 02/01/2016 0817   K 5.5 (H) 02/01/2016 0817   CL 105 02/01/2016 0817   CO2 25 01/23/2016 1638   GLUCOSE 153 (H) 02/01/2016 0817   BUN 26 (H) 02/01/2016 0817   CREATININE 0.80 02/01/2016 0817   CREATININE 0.83 12/23/2015 1608   CALCIUM 9.1 01/23/2016 1638   GFRNONAA >60 01/23/2016 1638   GFRNONAA 80 12/23/2015 1608   GFRAA >60 01/23/2016 1638   GFRAA >89 12/23/2015 Parker [employer], MD [facility] Office: [phone] Pager: [phone]  02/01/2016, 9:06 AM

## 2016-02-01 NOTE — Progress Notes (Addendum)
Pt has generalized red rash on arms, legs; rash on left upper arm open, no drainage noted; Eliezer Champagne, RN notified

## 2016-02-01 NOTE — Telephone Encounter (Signed)
Sched [facility] appt 8/25 at 9:00. Spoke to sister to inform them of appt.  Pt already has appt w/ Dr. Amalia Hailey for her diabetic foot ulcer on 02/02/16.

## 2016-02-01 NOTE — Telephone Encounter (Signed)
-----   Message from Mena Goes, RN sent at 02/01/2016  1:19 PM EDT ----- Regarding: schedule   ----- Message ----- From: Conrad [place], MD Sent: 02/01/2016  12:28 PM To: [facility] Charge Pool  Debbie [name] [id number] 26-May-1961  PROCEDURE: 1.  Right common femoral artery cannulation under ultrasound guidance 2.  Placement of catheter in aorta 3.  Aortogram 4.  Conscious sedation for 50 minutes 5.  Bilateral leg runoff  Follow-up:  4 weeks  Orders(s) for follow-up:  1.  Refer to Dr. Sharol Given ASAP for diabetic L great toe infection

## 2016-02-01 NOTE — Discharge Instructions (Signed)
Angiogram, Care After °Refer to this sheet in the next few weeks. These instructions provide you with information about caring for yourself after your procedure. Your health care provider may also give you more specific instructions. Your treatment has been planned according to current medical practices, but problems sometimes occur. Call your health care provider if you have any problems or questions after your procedure. °WHAT TO EXPECT AFTER THE PROCEDURE °After your procedure, it is typical to have the following: °· Bruising at the catheter insertion site that usually fades within 1-2 weeks. °· Blood collecting in the tissue (hematoma) that may be painful to the touch. It should usually decrease in size and tenderness within 1-2 weeks. °HOME CARE INSTRUCTIONS °· Take medicines only as directed by your health care provider. °· You may shower 24-48 hours after the procedure or as directed by your health care provider. Remove the bandage (dressing) and gently wash the site with plain soap and water. Pat the area dry with a clean towel. Do not rub the site, because this may cause bleeding. °· Do not take baths, swim, or use a hot tub until your health care provider approves. °· Check your insertion site every day for redness, swelling, or drainage. °· Do not apply powder or lotion to the site. °· Do not lift over 10 lb (4.5 kg) for 5 days after your procedure or as directed by your health care provider. °· Ask your health care provider when it is okay to: °¨ Return to work or school. °¨ Resume usual physical activities or sports. °¨ Resume sexual activity. °· Do not drive home if you are discharged the same day as the procedure. Have someone else drive you. °· You may drive 24 hours after the procedure unless otherwise instructed by your health care provider. °· Do not operate machinery or power tools for 24 hours after the procedure or as directed by your health care provider. °· If your procedure was done as an  outpatient procedure, which means that you went home the same day as your procedure, a responsible adult should be with you for the first 24 hours after you arrive home. °· Keep all follow-up visits as directed by your health care provider. This is important. °SEEK MEDICAL CARE IF: °· You have a fever. °· You have chills. °· You have increased bleeding from the catheter insertion site. Hold pressure on the site. °SEEK IMMEDIATE MEDICAL CARE IF: °· You have unusual pain at the catheter insertion site. °· You have redness, warmth, or swelling at the catheter insertion site. °· You have drainage (other than a small amount of blood on the dressing) from the catheter insertion site. °· The catheter insertion site is bleeding, and the bleeding does not stop after 30 minutes of holding steady pressure on the site. °· The area near or just beyond the catheter insertion site becomes pale, cool, tingly, or numb. °  °This information is not intended to replace advice given to you by your health care provider. Make sure you discuss any questions you have with your health care provider. °  °Document Released: 01/13/2005 Document Revised: 07/18/2014 Document Reviewed: 11/28/2012 °Elsevier Interactive Patient Education ©2016 [employer] ° °

## 2016-02-02 ENCOUNTER — Emergency Department (HOSPITAL_COMMUNITY): Payer: Medicaid Other

## 2016-02-02 ENCOUNTER — Encounter (HOSPITAL_COMMUNITY): Payer: Self-pay | Admitting: Vascular Surgery

## 2016-02-02 ENCOUNTER — Observation Stay (HOSPITAL_COMMUNITY): Payer: Medicaid Other

## 2016-02-02 ENCOUNTER — Ambulatory Visit (INDEPENDENT_AMBULATORY_CARE_PROVIDER_SITE_OTHER): Payer: Medicaid Other | Admitting: Podiatry

## 2016-02-02 ENCOUNTER — Inpatient Hospital Stay (HOSPITAL_COMMUNITY)
Admission: EM | Admit: 2016-02-02 | Discharge: 2016-02-05 | DRG: 300 | Disposition: A | Discharge status: Home Health | Payer: Medicaid Other | Attending: Family Medicine | Admitting: Family Medicine

## 2016-02-02 VITALS — BP 122/82 | HR 62

## 2016-02-02 DIAGNOSIS — M869 Osteomyelitis, unspecified: Secondary | ICD-10-CM | POA: Diagnosis present

## 2016-02-02 DIAGNOSIS — E1165 Type 2 diabetes mellitus with hyperglycemia: Secondary | ICD-10-CM | POA: Diagnosis present

## 2016-02-02 DIAGNOSIS — L03032 Cellulitis of left toe: Secondary | ICD-10-CM | POA: Diagnosis not present

## 2016-02-02 DIAGNOSIS — G894 Chronic pain syndrome: Secondary | ICD-10-CM | POA: Diagnosis present

## 2016-02-02 DIAGNOSIS — Z789 Other specified health status: Secondary | ICD-10-CM

## 2016-02-02 DIAGNOSIS — E785 Hyperlipidemia, unspecified: Secondary | ICD-10-CM | POA: Diagnosis present

## 2016-02-02 DIAGNOSIS — L089 Local infection of the skin and subcutaneous tissue, unspecified: Secondary | ICD-10-CM

## 2016-02-02 DIAGNOSIS — Z6836 Body mass index (BMI) 36.0-36.9, adult: Secondary | ICD-10-CM

## 2016-02-02 DIAGNOSIS — Z79899 Other long term (current) drug therapy: Secondary | ICD-10-CM

## 2016-02-02 DIAGNOSIS — E1142 Type 2 diabetes mellitus with diabetic polyneuropathy: Secondary | ICD-10-CM | POA: Diagnosis present

## 2016-02-02 DIAGNOSIS — E1169 Type 2 diabetes mellitus with other specified complication: Secondary | ICD-10-CM | POA: Diagnosis not present

## 2016-02-02 DIAGNOSIS — N179 Acute kidney failure, unspecified: Secondary | ICD-10-CM | POA: Diagnosis not present

## 2016-02-02 DIAGNOSIS — M79675 Pain in left toe(s): Secondary | ICD-10-CM

## 2016-02-02 DIAGNOSIS — E11628 Type 2 diabetes mellitus with other skin complications: Secondary | ICD-10-CM

## 2016-02-02 DIAGNOSIS — Z89431 Acquired absence of right foot: Secondary | ICD-10-CM

## 2016-02-02 DIAGNOSIS — E039 Hypothyroidism, unspecified: Secondary | ICD-10-CM | POA: Diagnosis present

## 2016-02-02 DIAGNOSIS — E6609 Other obesity due to excess calories: Secondary | ICD-10-CM | POA: Diagnosis present

## 2016-02-02 DIAGNOSIS — E10319 Type 1 diabetes mellitus with unspecified diabetic retinopathy without macular edema: Secondary | ICD-10-CM | POA: Diagnosis present

## 2016-02-02 DIAGNOSIS — Z888 Allergy status to other drugs, medicaments and biological substances status: Secondary | ICD-10-CM

## 2016-02-02 DIAGNOSIS — H540 Blindness, both eyes: Secondary | ICD-10-CM | POA: Diagnosis present

## 2016-02-02 DIAGNOSIS — M7989 Other specified soft tissue disorders: Secondary | ICD-10-CM

## 2016-02-02 DIAGNOSIS — I959 Hypotension, unspecified: Secondary | ICD-10-CM | POA: Diagnosis not present

## 2016-02-02 DIAGNOSIS — Z880 Allergy status to penicillin: Secondary | ICD-10-CM

## 2016-02-02 DIAGNOSIS — I70209 Unspecified atherosclerosis of native arteries of extremities, unspecified extremity: Secondary | ICD-10-CM

## 2016-02-02 DIAGNOSIS — T368X5A Adverse effect of other systemic antibiotics, initial encounter: Secondary | ICD-10-CM | POA: Diagnosis not present

## 2016-02-02 DIAGNOSIS — L0390 Cellulitis, unspecified: Secondary | ICD-10-CM | POA: Diagnosis present

## 2016-02-02 DIAGNOSIS — Z833 Family history of diabetes mellitus: Secondary | ICD-10-CM

## 2016-02-02 DIAGNOSIS — Z7982 Long term (current) use of aspirin: Secondary | ICD-10-CM

## 2016-02-02 DIAGNOSIS — F411 Generalized anxiety disorder: Secondary | ICD-10-CM | POA: Diagnosis present

## 2016-02-02 DIAGNOSIS — F4310 Post-traumatic stress disorder, unspecified: Secondary | ICD-10-CM | POA: Diagnosis present

## 2016-02-02 DIAGNOSIS — Z794 Long term (current) use of insulin: Secondary | ICD-10-CM

## 2016-02-02 DIAGNOSIS — E1151 Type 2 diabetes mellitus with diabetic peripheral angiopathy without gangrene: Principal | ICD-10-CM | POA: Diagnosis present

## 2016-02-02 DIAGNOSIS — I10 Essential (primary) hypertension: Secondary | ICD-10-CM | POA: Diagnosis present

## 2016-02-02 LAB — GLUCOSE, CAPILLARY
GLUCOSE-CAPILLARY: 98 mg/dL (ref 65–99)
Glucose-Capillary: 189 mg/dL — ABNORMAL HIGH (ref 65–99)

## 2016-02-02 LAB — CBC WITH DIFFERENTIAL/PLATELET
BASOS ABS: 0.1 10*3/uL (ref 0.0–0.1)
BASOS PCT: 1 %
EOS ABS: 0.6 10*3/uL (ref 0.0–0.7)
Eosinophils Relative: 6 %
HCT: 41.2 % (ref 36.0–46.0)
Hemoglobin: 13.3 g/dL (ref 12.0–15.0)
Lymphocytes Relative: 46 %
Lymphs Abs: 4.5 10*3/uL — ABNORMAL HIGH (ref 0.7–4.0)
MCH: 27.5 pg (ref 26.0–34.0)
MCHC: 32.3 g/dL (ref 30.0–36.0)
MCV: 85.1 fL (ref 78.0–100.0)
MONO ABS: 0.4 10*3/uL (ref 0.1–1.0)
MONOS PCT: 4 %
Neutro Abs: 4.1 10*3/uL (ref 1.7–7.7)
Neutrophils Relative %: 43 %
PLATELETS: 258 10*3/uL (ref 150–400)
RBC: 4.84 MIL/uL (ref 3.87–5.11)
RDW: 13.9 % (ref 11.5–15.5)
WBC: 9.7 10*3/uL (ref 4.0–10.5)

## 2016-02-02 LAB — COMPREHENSIVE METABOLIC PANEL
ALBUMIN: 3.4 g/dL — AB (ref 3.5–5.0)
ALK PHOS: 120 U/L (ref 38–126)
ALT: 40 U/L (ref 14–54)
AST: 26 U/L (ref 15–41)
Anion gap: 6 (ref 5–15)
BILIRUBIN TOTAL: 0.6 mg/dL (ref 0.3–1.2)
BUN: 20 mg/dL (ref 6–20)
CALCIUM: 9.1 mg/dL (ref 8.9–10.3)
CO2: 29 mmol/L (ref 22–32)
CREATININE: 0.8 mg/dL (ref 0.44–1.00)
Chloride: 105 mmol/L (ref 101–111)
GFR calc non Af Amer: >60 mL/min (ref >60)
GLUCOSE: 246 mg/dL — AB (ref 65–99)
Potassium: 4.3 mmol/L (ref 3.5–5.1)
SODIUM: 140 mmol/L (ref 135–145)
TOTAL PROTEIN: 6.4 g/dL — AB (ref 6.5–8.1)

## 2016-02-02 LAB — CBG MONITORING, ED: Glucose-Capillary: 228 mg/dL — ABNORMAL HIGH (ref 65–99)

## 2016-02-02 MED ORDER — ENOXAPARIN SODIUM 40 MG/0.4ML ~~LOC~~ SOLN
40.0000 mg | SUBCUTANEOUS | Status: DC
  Administered 2016-02-02: 40 mg via SUBCUTANEOUS
  Filled 2016-02-02 (×2): qty 0.4

## 2016-02-02 MED ORDER — VANCOMYCIN HCL IN DEXTROSE 1-5 GM/200ML-% IV SOLN
1000.0000 mg | Freq: Two times a day (BID) | INTRAVENOUS | Status: DC
  Administered 2016-02-03 – 2016-02-04 (×3): 1000 mg via INTRAVENOUS
  Filled 2016-02-02 (×5): qty 200

## 2016-02-02 MED ORDER — LINAGLIPTIN 5 MG PO TABS
5.0000 mg | ORAL_TABLET | Freq: Every day | ORAL | Status: DC
  Administered 2016-02-03 – 2016-02-05 (×3): 5 mg via ORAL
  Filled 2016-02-02 (×3): qty 1

## 2016-02-02 MED ORDER — SODIUM CHLORIDE 0.9 % IV SOLN
INTRAVENOUS | Status: DC
  Administered 2016-02-02 – 2016-02-03 (×2): via INTRAVENOUS

## 2016-02-02 MED ORDER — OXYCODONE HCL 5 MG PO TABS: 5.0000 mg | ORAL_TABLET | ORAL | Status: DC | PRN

## 2016-02-02 MED ORDER — HYDROCHLOROTHIAZIDE 12.5 MG PO CAPS
12.5000 mg | ORAL_CAPSULE | Freq: Every day | ORAL | Status: DC
  Administered 2016-02-02: 12.5 mg via ORAL
  Filled 2016-02-02: qty 1

## 2016-02-02 MED ORDER — ASPIRIN EC 81 MG PO TBEC
81.0000 mg | DELAYED_RELEASE_TABLET | Freq: Every day | ORAL | Status: DC
  Administered 2016-02-03 – 2016-02-05 (×3): 81 mg via ORAL
  Filled 2016-02-02 (×3): qty 1

## 2016-02-02 MED ORDER — INSULIN ASPART 100 UNIT/ML ~~LOC~~ SOLN
0.0000 [IU] | Freq: Every day | SUBCUTANEOUS | Status: DC
Start: 2016-02-02 — End: 2016-02-05

## 2016-02-02 MED ORDER — INSULIN ASPART 100 UNIT/ML ~~LOC~~ SOLN
4.0000 [IU] | Freq: Three times a day (TID) | SUBCUTANEOUS | Status: DC
  Administered 2016-02-02 – 2016-02-05 (×7): 4 [IU] via SUBCUTANEOUS

## 2016-02-02 MED ORDER — CLOPIDOGREL BISULFATE 75 MG PO TABS
75.0000 mg | ORAL_TABLET | Freq: Every day | ORAL | Status: DC
  Administered 2016-02-03 – 2016-02-05 (×3): 75 mg via ORAL
  Filled 2016-02-02 (×3): qty 1

## 2016-02-02 MED ORDER — INSULIN ASPART 100 UNIT/ML ~~LOC~~ SOLN
0.0000 [IU] | Freq: Three times a day (TID) | SUBCUTANEOUS | Status: DC
  Administered 2016-02-02: 4 [IU] via SUBCUTANEOUS
  Administered 2016-02-03: 3 [IU] via SUBCUTANEOUS

## 2016-02-02 MED ORDER — LOTEPREDNOL ETABONATE 0.5 % OP SUSP
1.0000 [drp] | Freq: Two times a day (BID) | OPHTHALMIC | Status: DC
  Administered 2016-02-02 – 2016-02-05 (×6): 1 [drp] via OPHTHALMIC
  Filled 2016-02-02: qty 5

## 2016-02-02 MED ORDER — GABAPENTIN 300 MG PO CAPS
300.0000 mg | ORAL_CAPSULE | Freq: Three times a day (TID) | ORAL | Status: DC
  Administered 2016-02-02 – 2016-02-05 (×9): 300 mg via ORAL
  Filled 2016-02-02 (×9): qty 1

## 2016-02-02 MED ORDER — LISINOPRIL 10 MG PO TABS
10.0000 mg | ORAL_TABLET | Freq: Every day | ORAL | Status: DC
Start: 2016-02-02 — End: 2016-02-03
  Administered 2016-02-02: 10 mg via ORAL
  Filled 2016-02-02: qty 1

## 2016-02-02 MED ORDER — DIPHENHYDRAMINE HCL 25 MG PO CAPS
50.0000 mg | ORAL_CAPSULE | Freq: Four times a day (QID) | ORAL | Status: DC | PRN
  Administered 2016-02-02: 50 mg via ORAL
  Filled 2016-02-02: qty 2

## 2016-02-02 MED ORDER — VANCOMYCIN HCL 10 G IV SOLR
2000.0000 mg | Freq: Once | INTRAVENOUS | Status: AC
  Administered 2016-02-02: 2000 mg via INTRAVENOUS
  Filled 2016-02-02: qty 2000

## 2016-02-02 MED ORDER — PROPRANOLOL HCL ER 60 MG PO CP24
60.0000 mg | ORAL_CAPSULE | Freq: Every day | ORAL | Status: DC
  Administered 2016-02-02 – 2016-02-05 (×4): 60 mg via ORAL
  Filled 2016-02-02 (×4): qty 1

## 2016-02-02 MED ORDER — INSULIN GLARGINE 100 UNIT/ML ~~LOC~~ SOLN
50.0000 [IU] | Freq: Every day | SUBCUTANEOUS | Status: DC
  Administered 2016-02-02 – 2016-02-04 (×3): 50 [IU] via SUBCUTANEOUS
  Filled 2016-02-02 (×4): qty 0.5

## 2016-02-02 MED ORDER — SERTRALINE HCL 50 MG PO TABS
50.0000 mg | ORAL_TABLET | Freq: Every day | ORAL | Status: DC
  Administered 2016-02-02 – 2016-02-05 (×4): 50 mg via ORAL
  Filled 2016-02-02 (×4): qty 1

## 2016-02-02 MED ORDER — CETYLPYRIDINIUM CHLORIDE 0.05 % MT LIQD
7.0000 mL | Freq: Two times a day (BID) | OROMUCOSAL | Status: DC
  Administered 2016-02-02 – 2016-02-05 (×6): 7 mL via OROMUCOSAL

## 2016-02-02 MED ORDER — LISINOPRIL-HYDROCHLOROTHIAZIDE 10-12.5 MG PO TABS: 1.0000 | ORAL_TABLET | Freq: Every day | ORAL | Status: DC

## 2016-02-02 MED ORDER — CANAGLIFLOZIN 100 MG PO TABS
100.0000 mg | ORAL_TABLET | Freq: Every day | ORAL | Status: DC
  Administered 2016-02-03 – 2016-02-05 (×3): 100 mg via ORAL
  Filled 2016-02-02 (×4): qty 1

## 2016-02-02 MED ORDER — SENNA 8.6 MG PO TABS
2.0000 | ORAL_TABLET | Freq: Every evening | ORAL | Status: DC | PRN
  Administered 2016-02-03: 17.2 mg via ORAL
  Filled 2016-02-02: qty 2

## 2016-02-02 NOTE — Progress Notes (Signed)
  Pt admitted to the unit. Pt is stable, alert and oriented per baseline. Oriented to room, staff, and call bell. Educated to call for any assistance. Bed in lowest position, call bell within reach- will continue to monitor. 

## 2016-02-02 NOTE — ED Triage Notes (Signed)
Pt reports to the ED for eval of left great toe erythema, decreased sensation, and decreased left sided pedal pulses. Pt had an angiogram performed on the right leg yesterday and was d/ced home. Pt A&O, resp e/u, and skin warm and dry.

## 2016-02-02 NOTE — ED Notes (Signed)
2nd IV team RN at bedside attempting to obtain IV access.

## 2016-02-02 NOTE — ED Provider Notes (Signed)
[facility] DEPT Provider Note   CSN: [id number] Arrival date & time: 02/02/16  1124  First Provider Contact:  None       History   Chief Complaint Chief Complaint  Patient presents with  . Cellulitis    HPI Debbie [name] is a 55 y.o. female.  Pt does not speak [place].  Her sister interprets.  Pt has had cellulitis to her left great toe for the past few weeks.  Her pcp noticed a possible necrotic left toe and referred pt to get a CT angio.  The CT angio was ok.  So, she was referred to Dr. Bridgett Larsson from vascular.  The pt was seen in the ED on 7/15 and mild redness was noted.  The pt did have an aortic angiogram with run off by Dr. Bridgett Larsson yesterday.  It showed digital arterial disease to left foot and he recommended no vascular surgical intervention.  The pt saw Dr. Amalia Hailey from podiatry today who sent her here.  The pt denies any pain, but she does not feel her foot due to her diabetes.  Pt's sister said that she's been on mupirocin for 2 months.  No oral abx.   The history is provided by the patient and a relative. A language interpreter was used.    Past Medical History:  Diagnosis Date  . Blind   . Cataract   . Chest pain    a. ? MI in 2010, [place]->medically managed  . Diabetic foot ulcer ([place])   . Hypertension    x ~ 10 years  . Hypothyroidism   . Obesity   . Type II or unspecified type diabetes mellitus with unspecified complication, uncontrolled    x ~ 10 years    Patient Active Problem List   Diagnosis Date Noted  . Atherosclerotic peripheral vascular disease with rest pain ([place]) 01/22/2016  . Type 2 diabetes mellitus with complication, with long-term current use of insulin ([place]) 11/05/2015  . Diarrhea 11/05/2015  . PAD (peripheral artery disease) ([place]) 11/05/2015  . Hypoglycemia 04/13/2015  . Chronic pain syndrome 02/05/2015  . Type 2 diabetes mellitus with hyperglycemia ([place]) 02/05/2015  . Fall at home 02/05/2015  . DM (diabetes mellitus) type I controlled,  peripheral vascular disorder ([place]) 07/24/2014  . DM (diabetes mellitus) type 2, uncontrolled, with ketoacidosis ([place]) 01/09/2014  . PTSD (post-traumatic stress disorder) 01/09/2014  . Fungal dermatitis 01/09/2014  . Essential hypertension 01/09/2014  . Hyperglycemia 11/19/2013  . DKA (diabetic ketoacidoses) ([place]) 11/19/2013  . Hyperkalemia 11/19/2013  . [place] 08/02/2013  . Uncontrolled diabetes mellitus ([place]) 08/02/2013  . Altered mental status 12/29/2012  . Generalized anxiety disorder 12/13/2012  . Polyneuropathy in diabetes(357.2) 12/13/2012  . Diabetic retinopathy ([place]) 12/12/2012  . Diabetic foot ulcers ([place]) 12/05/2012  . Diabetic foot infection ([place]) 11/30/2012  . Unspecified hypothyroidism 11/30/2012  . Adjustment disorder with mixed anxiety and depressed mood 11/10/2012  . Diabetes mellitus type 2 with peripheral artery disease ([place]) 11/09/2012  . HTN (hypertension) 11/09/2012    Past Surgical History:  Procedure Laterality Date  . BACK SURGERY    . PERIPHERAL VASCULAR CATHETERIZATION N/A 02/01/2016   Procedure: Abdominal Aortogram w/Lower Extremity;  Surgeon: Conrad Prospect, MD;  Location: [facility];  Service: Cardiovascular;  Laterality: N/A;  . TUBAL LIGATION      OB History    No data available       Home Medications    Prior to Admission medications   Medication Sig Start Date End Date  Taking? Authorizing Provider  ACCU-CHEK SOFTCLIX LANCETS lancets  11/17/15   Historical Provider, MD  acetaminophen-codeine (TYLENOL #3) 300-30 MG tablet Take 1 tablet by mouth every 4 (four) hours as needed. Patient taking differently: Take 1 tablet by mouth every 4 (four) hours as needed for moderate pain.  12/24/15   Tresa Garter, MD  aspirin EC 81 MG tablet Take 81 mg by mouth daily.    Historical Provider, MD  canagliflozin (INVOKANA) 100 MG TABS tablet TAKE ONE (1) TABLET BY MOUTH EVERY DAY 02/05/15   Tresa Garter, MD  ciprofloxacin (CIPRO) 500 MG  tablet Take 1 tablet (500 mg total) by mouth 2 (two) times daily. 02/01/16 02/11/16  Conrad [place], MD  clopidogrel (PLAVIX) 75 MG tablet Take 1 tablet (75 mg total) by mouth daily. 01/22/16   Conrad [place], MD  clotrimazole (LOTRIMIN) 1 % cream APPLY TO AFFECTED AREAS TWICE DAILY Patient taking differently: Apply 1 application topically 2 (two) times daily.  11/10/15   Tresa Garter, MD  diazepam (VALIUM) 2 MG tablet Take 1 tablet (2 mg total) by mouth every 8 (eight) hours as needed for anxiety. 11/05/15   Tresa Garter, MD  DULoxetine (CYMBALTA) 30 MG capsule Take 1 capsule (30 mg total) by mouth daily. 01/22/16   Conrad [place], MD  gabapentin (NEURONTIN) 300 MG capsule Take 1 capsule (300 mg total) by mouth 3 (three) times daily. 12/24/15   Tresa Garter, MD  glucose blood (ACCU-CHEK AVIVA PLUS) test strip Check blood sugar 3 times daily. 01/28/16   Tresa Garter, MD  insulin aspart (NOVOLOG) 100 UNIT/ML injection SEE NOTES Dispense QH 1 month Patient taking differently: Inject 0-15 Units into the skin See admin instructions. Three times daily per Sliding scale: 121-150, 2 units : 151-200, 3 units : 201-250, 5 units : 251-300 8 units : 301-350, 11 units : 351-400, 15 units 07/23/15   Willeen Niece, MD  insulin glargine (LANTUS) 100 UNIT/ML injection Inject 0.2 mLs (20 Units total) into the skin at bedtime. 12/31/15   Tresa Garter, MD  Insulin Syringe-Needle U-100 (INSULIN SYRINGE 1CC/30GX5/16") 30G X 5/16" 1 ML MISC USE AS DIRECTED WITH LANTUS AND NOVOLOG INJECTIONS 10/08/15   Tresa Garter, MD  Lancets (ACCU-CHEK SOFT TOUCH) lancets Use as instructed 12/31/15   Tresa Garter, MD  lisinopril-hydrochlorothiazide (PRINZIDE,ZESTORETIC) 10-12.5 MG tablet Take 1 tablet by mouth daily. 12/31/15   Tresa Garter, MD  loteprednol (LOTEMAX) 0.5 % ophthalmic suspension Place 1 drop into both eyes 2 (two) times daily. 01/09/14   Tresa Garter, MD  mupirocin ointment  (BACTROBAN) 2 % Apply 1 application topically 2 (two) times daily. 01/22/16   Conrad [place], MD  propranolol ER (INDERAL LA) 60 MG 24 hr capsule Take 1 capsule (60 mg total) by mouth daily. 07/23/15   Willeen Niece, MD  saxagliptin HCl (ONGLYZA) 2.5 MG TABS tablet Take 5 mg by mouth daily.     Historical Provider, MD  senna (SENOKOT) 8.6 MG TABS tablet Take 2 tablets (17.2 mg total) by mouth at bedtime as needed (for constipation). 07/23/15   Willeen Niece, MD  sertraline (ZOLOFT) 50 MG tablet Take 1 tablet by mouth daily. 11/17/15   Historical Provider, MD  sitaGLIPtin (JANUVIA) 100 MG tablet Take 1 tablet (100 mg total) by mouth daily. 11/11/15   Tresa Garter, MD  triamcinolone cream (KENALOG) 0.1 % Apply 1 application topically 2 (two) times daily. 11/13/15  Tresa Garter, MD    Family History Family History  Problem Relation Age of Onset  . Diabetes Father     died in his 50's?/died in her 17's?  . Diabetes Sister     alive and well.    Social History Social History  Substance Use Topics  . Smoking status: Never Smoker  . Smokeless tobacco: Not on file  . Alcohol use No     Allergies   Hydrocortisone and Penicillins   Review of Systems Review of Systems  Skin: Positive for color change and wound.  All other systems reviewed and are negative.    Physical Exam Updated Vital Signs Pulse 82   Resp 16   LMP 05/21/2013   SpO2 100%   Physical Exam  Constitutional: She is oriented to person, place, and time. She appears well-developed and well-nourished.  HENT:  Head: Normocephalic and atraumatic.  Right Ear: External ear normal.  Left Ear: External ear normal.  Nose: Nose normal.  Mouth/Throat: Oropharynx is clear and moist.  Eyes:  Blind in both eyese  Neck: Normal range of motion. Neck supple.  Cardiovascular: Normal rate, regular rhythm, normal heart sounds and intact distal pulses.   Pulmonary/Chest: Effort normal and breath sounds normal.  Abdominal:  Soft. Bowel sounds are normal.  Musculoskeletal: Normal range of motion.  Neurological: She is alert and oriented to person, place, and time.  Skin: Skin is warm.  Left great toe with redness and cellulitis extending up foot.  No abscess.    Psychiatric: She has a normal mood and affect. Her behavior is normal. Judgment and thought content normal.  Nursing note and vitals reviewed.    ED Treatments / Results  Labs (all labs ordered are listed, but only abnormal results are displayed) Labs Reviewed  COMPREHENSIVE METABOLIC PANEL - Abnormal; Notable for the following:       Result Value   Glucose, Bld 246 (*)    Total Protein 6.4 (*)    Albumin 3.4 (*)    All other components within normal limits  CBC WITH DIFFERENTIAL/PLATELET - Abnormal; Notable for the following:    Lymphs Abs 4.5 (*)    All other components within normal limits  CBG MONITORING, ED - Abnormal; Notable for the following:    Glucose-Capillary 228 (*)    All other components within normal limits  URINALYSIS, ROUTINE W REFLEX MICROSCOPIC (NOT AT [facility])    EKG  EKG Interpretation None       Radiology Dg Foot Complete Left  Result Date: 02/02/2016 CLINICAL DATA:  Pain and swelling in the left foot, particularly of the left first and second digits. EXAM: LEFT FOOT - COMPLETE 3+ VIEW COMPARISON:  Left foot radiographs 01/23/2016. FINDINGS: No acute bone or soft tissue abnormalities are present. There is no osseous erosion. Extensive small vessel disease is present throughout the foot. The joints are located. A small plantar calcaneal spur is noted. IMPRESSION: 1. No acute abnormality. 2. Extensive vascular calcifications compatible with diabetes. Electronically Signed   By: San Morelle M.D.   On: 02/02/2016 12:29   Procedures Procedures (including critical care time)  Medications Ordered in ED Medications  vancomycin (VANCOCIN) 2,000 mg in sodium chloride 0.9 % 500 mL IVPB (not administered)    vancomycin (VANCOCIN) IVPB 1000 mg/200 mL premix (not administered)     Initial Impression / Assessment and Plan / ED Course  I have reviewed the triage vital signs and the nursing notes.  Pertinent labs & imaging results  that were available during my care of the patient were reviewed by me and considered in my medical decision making (see chart for details).  Clinical Course    Pt is not improving at home, it is getting worse.  She has decreased blood flow to that toe and her blood sugar is elevated.  Pt d/w unassigned medical admissions (family practice residency with Dr. Andria Frames as attending) who will admit pt for observation.  Final Clinical Impressions(s) / ED Diagnoses   Final diagnoses:  Cellulitis of toe of left foot  Poorly controlled type 2 diabetes mellitus ([place])  Failure of outpatient treatment    New Prescriptions New Prescriptions   No medications on file     Isla Pence, MD 02/02/16 1315

## 2016-02-02 NOTE — Progress Notes (Signed)
Pharmacy Antibiotic Note  Maryah Frohn is a 55 y.o. female admitted on 02/02/2016 with cellulitis.  Pharmacy has been consulted for vancomycin dosing. Pt is afebrile and WBC is WNL. SCr is WNL.   Plan: - Vancomycin 2gm IV x 1 then 1gm IV Q12H - F/u renal fxn, C&S, clinical status and trough at SS     Temp (24hrs), Avg:98.7 F (37.1 C), Min:98.7 F (37.1 C), Max:98.7 F (37.1 C)   Recent Labs Lab 02/01/16 0817 02/02/16 1153  WBC  --  9.7  CREATININE 0.80 0.80    Estimated Creatinine Clearance: 96.7 mL/min (by C-G formula based on SCr of 0.8 mg/dL).    Allergies  Allergen Reactions  . Hydrocortisone Rash    Redness  . Penicillins Rash    Has patient had a PCN reaction causing immediate rash, facial/tongue/throat swelling, SOB or lightheadedness with hypotension: Yes Has patient had a PCN reaction causing severe rash involving mucus membranes or skin necrosis: No Has patient had a PCN reaction that required hospitalization No Has patient had a PCN reaction occurring within the last 10 years: No If all of the above answers are "NO", then may proceed with Cephalosporin use.     Antimicrobials this admission: Vanc 7/25>>  Dose adjustments this admission: N/A  Microbiology results: Pending  Thank you for allowing pharmacy to be a part of this patient's care.  Sally-Anne Wamble, Rande Lawman 02/02/2016 12:42 PM

## 2016-02-02 NOTE — ED Notes (Signed)
Pt's CBG result was 228. Informed Benjamine Mola - RN and [place] - RN.

## 2016-02-02 NOTE — H&P (Signed)
[facility] Admission History and Physical Service Pager: [phone]  Patient name: Debbie [name] Medical record number: [id number] Date of birth: 1961-02-02 Age: 55 y.o. Gender: female  Primary Care Provider: Angelica Chessman, MD Consultants: None Code Status: FULL  Chief Complaint: L toe swelling and pain  Assessment and Plan: Debbie [name] is a 55 y.o. female presenting with L great toe pain and swelling. 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   L great toe cellulitis: Failed outpatient treatment with topical mupirocin. Physical exam findings of erythema and swelling as well as pain consistent with cellulitis. WBC WNL at 9.7. Afebrile. DG foot in ED with no soft tissue abnormalities or osseous erosions making osteomyelitis less likely. Outpatient CTA performed day prior to admission showing digital artery disease of the affected foot related to Type II DM with no indication for surgical intervention.  - Place in observation, attending Dr. Andria Frames - Continue vancomycin  - Oxy IR 5mg  q4 PRN pain - Will consult ortho if not improving with IV abx - AM CBC - Consider MRI foot if concern for osteo  Hives: Etiology unclear. Possibly medication related, although patient has not started any new medications in past week. Also less likely 2/2 any type of exposure as patient has not been outdoors or used any new soaps or lotions. No difficulty breathing or concern for anaphylaxis.  - Benadryl PRN  Type II DM: Uncontrolled in the past with complications of neuropathy and retinopathy leading to blindness bilaterally. Last A1C 6.3 (12/24/15). On Lantus 100U QHS, Novolog TID, Invokana, and reportedly both saxagliptin and sitagliptin at home. CBG 246 on admission.  - Sitagliptin not on record at patient's pharmacy, so will not continue as duplicate therapy with saxagliptin - Continue saxagliptin,  Invokana - Lantus 50U QHS - SSI - Continue gabapentin - CBG ACHS  Peripheral arterial disease: Followed by Dr. Bridgett Larsson with vascular surgery. On ASA and Plavix at home.  - Continue home Plavix and ASA  HTN: On lisinopril-HCTZ and propranolol at home. BP 140/49 on admission.  - Continue lisinopril-HCTZ and propranolol  Depression/anxiety: Reportedly on both Cymbalta and Zoloft at home.  - Continue Zoloft - Will not continue Cymbalta as duplicate therapy with SSRI and SNRI  FEN/GI: carb modified heart healthy diet, NS@100cc /hr, Senna Prophylaxis: Lovenox  Disposition: Place in observation  History of Present Illness:  Debbie [name] is a 55 y.o. female presenting with L great toe pain and swelling.   History provided by patient's sister using Arabic language interpreter.   Patient has had swelling, redness, and pain in her L great two for a couple months. She was prescribed mupirocin ointment by her podiatrist about two months ago, with no improvement in symptoms. She has not taken any oral antibiotics for this. She was seen by her podiatrist this morning, who recommended she go to the ED for IV antibiotics as her toe looked worse than at prior appointments. Patient is also followed by vascular surgery for peripheral arterial disease. CT angiogram of the affected foot was actually performed yesterday, which showed arterial disease of the foot but no indication for surgery at this time. Patient denies fevers, chills.   Patient also endorses onset of red pruritic hives about a week ago. Denies beginning any new medications or using any new lotions or soaps at that time. Has not been outdoors or near any potentially poisonous plants. Has not taken anything to help with the itching. Denies  difficulty breathing or feeling of throat closing in. Has allergies to hydrocortisone and penicillin, but has not used these medications recently.   Of note, patient had R foot amputated a few years ago 2/2  complications of diabetes, and is also blind bilaterally due to diabetic retinopathy. She is cared for by her sister and does not know any details about her own health.   Review Of Systems: Per HPI with the following additions: denies abdominal pain, nausea/vomiting, diarrhea, constipation.  Otherwise the remainder of the systems were negative.  Patient Active Problem List   Diagnosis Date Noted  . Cellulitis 02/02/2016  . Atherosclerotic peripheral vascular disease with rest pain ([place]) 01/22/2016  . Type 2 diabetes mellitus with complication, with long-term current use of insulin ([place]) 11/05/2015  . Diarrhea 11/05/2015  . PAD (peripheral artery disease) ([place]) 11/05/2015  . Hypoglycemia 04/13/2015  . Chronic pain syndrome 02/05/2015  . Type 2 diabetes mellitus with hyperglycemia ([place]) 02/05/2015  . Fall at home 02/05/2015  . DM (diabetes mellitus) type I controlled, peripheral vascular disorder ([place]) 07/24/2014  . DM (diabetes mellitus) type 2, uncontrolled, with ketoacidosis ([place]) 01/09/2014  . PTSD (post-traumatic stress disorder) 01/09/2014  . Fungal dermatitis 01/09/2014  . Essential hypertension 01/09/2014  . Hyperglycemia 11/19/2013  . DKA (diabetic ketoacidoses) ([place]) 11/19/2013  . Hyperkalemia 11/19/2013  . [place] 08/02/2013  . Uncontrolled diabetes mellitus ([place]) 08/02/2013  . Altered mental status 12/29/2012  . Generalized anxiety disorder 12/13/2012  . Polyneuropathy in diabetes(357.2) 12/13/2012  . Diabetic retinopathy ([place]) 12/12/2012  . Diabetic foot ulcers ([place]) 12/05/2012  . Diabetic foot infection ([place]) 11/30/2012  . Unspecified hypothyroidism 11/30/2012  . Adjustment disorder with mixed anxiety and depressed mood 11/10/2012  . Diabetes mellitus type 2 with peripheral artery disease ([place]) 11/09/2012  . HTN (hypertension) 11/09/2012    Past Medical History: Past Medical History:  Diagnosis Date  . Blind   . Cataract   . Chest pain    a. ? MI in 2010,  [place]->medically managed  . Diabetic foot ulcer ([place])   . Hypertension    x ~ 10 years  . Hypothyroidism   . Obesity   . Type II or unspecified type diabetes mellitus with unspecified complication, uncontrolled    x ~ 10 years    Past Surgical History: Past Surgical History:  Procedure Laterality Date  . BACK SURGERY    . PERIPHERAL VASCULAR CATHETERIZATION N/A 02/01/2016   Procedure: Abdominal Aortogram w/Lower Extremity;  Surgeon: Conrad Livingston, MD;  Location: [facility];  Service: Cardiovascular;  Laterality: N/A;  . TUBAL LIGATION      Social History: Social History  Substance Use Topics  . Smoking status: Never Smoker  . Smokeless tobacco: Not on file  . Alcohol use No   Additional social history: Is cared for by her sister.   Please also refer to relevant sections of EMR.  Family History: Family History  Problem Relation Age of Onset  . Diabetes Father     died in his 50's?/died in her 40's?  . Diabetes Sister     alive and well.    Allergies and Medications: Allergies  Allergen Reactions  . Hydrocortisone Rash    Redness  . Penicillins Rash    Has patient had a PCN reaction causing immediate rash, facial/tongue/throat swelling, SOB or lightheadedness with hypotension: Yes Has patient had a PCN reaction causing severe rash involving mucus membranes or skin necrosis: No Has patient had a PCN reaction that required hospitalization  No Has patient had a PCN reaction occurring within the last 10 years: No If all of the above answers are "NO", then may proceed with Cephalosporin use.    No current facility-administered medications on file prior to encounter.    Current Outpatient Prescriptions on File Prior to Encounter  Medication Sig Dispense Refill  . ACCU-CHEK SOFTCLIX LANCETS lancets     . acetaminophen-codeine (TYLENOL #3) 300-30 MG tablet Take 1 tablet by mouth every 4 (four) hours as needed. (Patient taking differently: Take 1 tablet by mouth  every 4 (four) hours as needed for moderate pain. ) 60 tablet 0  . aspirin EC 81 MG tablet Take 81 mg by mouth daily.    . canagliflozin (INVOKANA) 100 MG TABS tablet TAKE ONE (1) TABLET BY MOUTH EVERY DAY 30 tablet 6  . ciprofloxacin (CIPRO) 500 MG tablet Take 1 tablet (500 mg total) by mouth 2 (two) times daily. 20 tablet 0  . clopidogrel (PLAVIX) 75 MG tablet Take 1 tablet (75 mg total) by mouth daily. 30 tablet 11  . clotrimazole (LOTRIMIN) 1 % cream APPLY TO AFFECTED AREAS TWICE DAILY (Patient taking differently: Apply 1 application topically 2 (two) times daily. ) 60 g 0  . diazepam (VALIUM) 2 MG tablet Take 1 tablet (2 mg total) by mouth every 8 (eight) hours as needed for anxiety. 60 tablet 0  . DULoxetine (CYMBALTA) 30 MG capsule Take 1 capsule (30 mg total) by mouth daily. 30 capsule 3  . gabapentin (NEURONTIN) 300 MG capsule Take 1 capsule (300 mg total) by mouth 3 (three) times daily. 90 capsule 3  . glucose blood (ACCU-CHEK AVIVA PLUS) test strip Check blood sugar 3 times daily. 100 each 12  . insulin aspart (NOVOLOG) 100 UNIT/ML injection SEE NOTES Dispense QH 1 month (Patient taking differently: Inject 0-15 Units into the skin See admin instructions. Three times daily per Sliding scale: 121-150, 2 units : 151-200, 3 units : 201-250, 5 units : 251-300 8 units : 301-350, 11 units : 351-400, 15 units) 20 mL 2  . insulin glargine (LANTUS) 100 UNIT/ML injection Inject 0.2 mLs (20 Units total) into the skin at bedtime. 10 mL 3  . Insulin Syringe-Needle U-100 (INSULIN SYRINGE 1CC/30GX5/16") 30G X 5/16" 1 ML MISC USE AS DIRECTED WITH LANTUS AND NOVOLOG INJECTIONS 100 each 12  . Lancets (ACCU-CHEK SOFT TOUCH) lancets Use as instructed 100 each 12  . lisinopril-hydrochlorothiazide (PRINZIDE,ZESTORETIC) 10-12.5 MG tablet Take 1 tablet by mouth daily. 90 tablet 0  . loteprednol (LOTEMAX) 0.5 % ophthalmic suspension Place 1 drop into both eyes 2 (two) times daily. 15 mL 3  . mupirocin ointment  (BACTROBAN) 2 % Apply 1 application topically 2 (two) times daily. 22 g 1  . propranolol ER (INDERAL LA) 60 MG 24 hr capsule Take 1 capsule (60 mg total) by mouth daily. 90 capsule 0  . saxagliptin HCl (ONGLYZA) 2.5 MG TABS tablet Take 5 mg by mouth daily.     Marland Kitchen senna (SENOKOT) 8.6 MG TABS tablet Take 2 tablets (17.2 mg total) by mouth at bedtime as needed (for constipation). 30 each 0  . sertraline (ZOLOFT) 50 MG tablet Take 1 tablet by mouth daily.  2  . sitaGLIPtin (JANUVIA) 100 MG tablet Take 1 tablet (100 mg total) by mouth daily. 30 tablet 2  . triamcinolone cream (KENALOG) 0.1 % Apply 1 application topically 2 (two) times daily. 80 g 3    Objective: BP (!) 140/49 (BP Location: Left Arm)   Pulse  86   Temp 98.7 F (37.1 C) (Oral)   Resp 18   Ht 5\' 6"  (1.676 m) Comment: estimated  Wt 228 lb (103.4 kg)   LMP 05/21/2013   SpO2 99%   BMI 36.80 kg/m  Exam: General: Sitting in bed in NAD Eyes: Blind bilaterally, wearing sunglasses ENTM: MMM Neck: Full ROM, supple Cardiovascular: RRR, no murmurs appreciated Respiratory: CTAB, normal WOB on RA Abdomen: Obese, soft, non-tender, non-distended, +BS MSK: Moving all extremities spontaneously  Skin: Erythema and swelling of L great toe. No fluctuance noted. Some tenderness with palpation. +DP pulse. R foot with midtarsal amputation. Erythematous macules presents primarily on upper extremities consistent with hives.  Neuro: A&Ox3, no focal deficits Psych: Appropriate mood and affect  Labs and Imaging: CBC BMET   Recent Labs Lab 02/02/16 1153  WBC 9.7  HGB 13.3  HCT 41.2  PLT 258    Recent Labs Lab 02/02/16 1153  NA 140  K 4.3  CL 105  CO2 29  BUN 20  CREATININE 0.80  GLUCOSE 246*  CALCIUM 9.1     Dg Foot Complete Left  Result Date: 02/02/2016 CLINICAL DATA:  Pain and swelling in the left foot, particularly of the left first and second digits. EXAM: LEFT FOOT - COMPLETE 3+ VIEW COMPARISON:  Left foot radiographs  01/23/2016. FINDINGS: No acute bone or soft tissue abnormalities are present. There is no osseous erosion. Extensive small vessel disease is present throughout the foot. The joints are located. A small plantar calcaneal spur is noted. IMPRESSION: 1. No acute abnormality. 2. Extensive vascular calcifications compatible with diabetes. Electronically Signed   By: San Morelle M.D.   On: 02/02/2016 12:29    Verner Mould, MD 02/02/2016, 3:17 PM PGY-2, [name] Intern pager: [phone], text pages welcome

## 2016-02-02 NOTE — ED Notes (Signed)
Admitting at bedside 

## 2016-02-02 NOTE — ED Notes (Signed)
IV team RN unable to obtain access. She states she will send second IV team nurse.

## 2016-02-02 NOTE — Patient Instructions (Addendum)
I'm referring this patient to the emergency department today for treatment of cellulitis in the left great toe. There is a history of oral antibiotics that apparently was started yesterday and not taken This patient has a history of amputation and peripheral arterial disease  \Diabetes and Foot Care Diabetes may cause you to have problems because of poor blood supply (circulation) to your feet and legs. This may cause the skin on your feet to become thinner, break easier, and heal more slowly. Your skin may become dry, and the skin may peel and crack. You may also have nerve damage in your legs and feet causing decreased feeling in them. You may not notice minor injuries to your feet that could lead to infections or more serious problems. Taking care of your feet is one of the most important things you can do for yourself.  HOME CARE INSTRUCTIONS  Wear shoes at all times, even in the house. Do not go barefoot. Bare feet are easily injured.  Check your feet daily for blisters, cuts, and redness. If you cannot see the bottom of your feet, use a mirror or ask someone for help.  Wash your feet with warm water (do not use hot water) and mild soap. Then pat your feet and the areas between your toes until they are completely dry. Do not soak your feet as this can dry your skin.  Apply a moisturizing lotion or petroleum jelly (that does not contain alcohol and is unscented) to the skin on your feet and to dry, brittle toenails. Do not apply lotion between your toes.  Trim your toenails straight across. Do not dig under them or around the cuticle. File the edges of your nails with an emery board or nail file.  Do not cut corns or calluses or try to remove them with medicine.  Wear clean socks or stockings every day. Make sure they are not too tight. Do not wear knee-high stockings since they may decrease blood flow to your legs.  Wear shoes that fit properly and have enough cushioning. To break in new  shoes, wear them for just a few hours a day. This prevents you from injuring your feet. Always look in your shoes before you put them on to be sure there are no objects inside.  Do not cross your legs. This may decrease the blood flow to your feet.  If you find a minor scrape, cut, or break in the skin on your feet, keep it and the skin around it clean and dry. These areas may be cleansed with mild soap and water. Do not cleanse the area with peroxide, alcohol, or iodine.  When you remove an adhesive bandage, be sure not to damage the skin around it.  If you have a wound, look at it several times a day to make sure it is healing.  Do not use heating pads or hot water bottles. They may burn your skin. If you have lost feeling in your feet or legs, you may not know it is happening until it is too late.  Make sure your health care provider performs a complete foot exam at least annually or more often if you have foot problems. Report any cuts, sores, or bruises to your health care provider immediately. SEEK MEDICAL CARE IF:   You have an injury that is not healing.  You have cuts or breaks in the skin.  You have an ingrown nail.  You notice redness on your legs or feet.  You feel burning or tingling in your legs or feet.  You have pain or cramps in your legs and feet.  Your legs or feet are numb.  Your feet always feel cold. SEEK IMMEDIATE MEDICAL CARE IF:   There is increasing redness, swelling, or pain in or around a wound.  There is a red line that goes up your leg.  Pus is coming from a wound.  You develop a fever or as directed by your health care provider.  You notice a bad smell coming from an ulcer or wound.   This information is not intended to replace advice given to you by your health care provider. Make sure you discuss any questions you have with your health care provider.   Document Released: 06/24/2000 Document Revised: 02/27/2013 Document Reviewed:  12/04/2012 Elsevier Interactive Patient Education [employer].

## 2016-02-02 NOTE — Progress Notes (Signed)
   Subjective:    Patient ID: Debbie [name], female    DOB: 04-03-1961, 55 y.o.   MRN: [id number]  HPI    This patient presents today seated in a wheelchair and unable to transfer. Patient does not communicate in [place] and her sister is present in the treatment room. Also in Database administrator is speaking interpreting for patient through her sister. Patient has approximately 2 month history of infection in the left hallux and apparently was given a prescription for doxycycline the past 24 hours which she has not started as well as topical mupirocin ointment. Patient has a history of peripheral arterial disease with history of recent vascular evaluation.   Review of Systems  Musculoskeletal: Positive for gait problem.  Skin: Positive for color change.       Objective:   Physical Exam  Patient response to questioning through sister an interpreter  Objective: Patient seated in a wheelchair and unable to transfer   Vascular: DP and PT pulses 0/4 bilaterally Capillary reflex delayed left  Neurological: Sensation to 10 g monofilament wire intact 0/5 bilaterally Vibratory sensation nonreactive bilaterally  Dermatological: Erythema distal left hallux to the dorsal left MPJ. No active drainage or malodor noted No malodor or warmth in the left hallux.  Musculoskeletal: Transmetatarsal amputation right      Assessment & Plan:   Assessment: Type II diabetic Absent pedal pulses consistent with peripheral arterial disease Peripheral neuropathy Cellulitis left hallux  Plan: At this time because patient has peripheral arterial disease, neuropathy and cellulitis and recommending the patient present to the emergency department today for IV antibiotics. This information was given to patient sister, written form and through [place] translator  Patient instructed to present to the emergency department today for follow-up care

## 2016-02-03 DIAGNOSIS — Z79899 Other long term (current) drug therapy: Secondary | ICD-10-CM | POA: Diagnosis not present

## 2016-02-03 DIAGNOSIS — E039 Hypothyroidism, unspecified: Secondary | ICD-10-CM | POA: Diagnosis present

## 2016-02-03 DIAGNOSIS — F4310 Post-traumatic stress disorder, unspecified: Secondary | ICD-10-CM | POA: Diagnosis present

## 2016-02-03 DIAGNOSIS — N179 Acute kidney failure, unspecified: Secondary | ICD-10-CM | POA: Diagnosis not present

## 2016-02-03 DIAGNOSIS — I959 Hypotension, unspecified: Secondary | ICD-10-CM | POA: Diagnosis not present

## 2016-02-03 DIAGNOSIS — E1169 Type 2 diabetes mellitus with other specified complication: Secondary | ICD-10-CM | POA: Diagnosis present

## 2016-02-03 DIAGNOSIS — Z880 Allergy status to penicillin: Secondary | ICD-10-CM | POA: Diagnosis not present

## 2016-02-03 DIAGNOSIS — E1151 Type 2 diabetes mellitus with diabetic peripheral angiopathy without gangrene: Secondary | ICD-10-CM | POA: Diagnosis present

## 2016-02-03 DIAGNOSIS — Z789 Other specified health status: Secondary | ICD-10-CM

## 2016-02-03 DIAGNOSIS — I10 Essential (primary) hypertension: Secondary | ICD-10-CM | POA: Diagnosis present

## 2016-02-03 DIAGNOSIS — E785 Hyperlipidemia, unspecified: Secondary | ICD-10-CM | POA: Diagnosis present

## 2016-02-03 DIAGNOSIS — Z794 Long term (current) use of insulin: Secondary | ICD-10-CM | POA: Diagnosis not present

## 2016-02-03 DIAGNOSIS — I70209 Unspecified atherosclerosis of native arteries of extremities, unspecified extremity: Secondary | ICD-10-CM | POA: Diagnosis not present

## 2016-02-03 DIAGNOSIS — H540 Blindness, both eyes: Secondary | ICD-10-CM | POA: Diagnosis present

## 2016-02-03 DIAGNOSIS — F411 Generalized anxiety disorder: Secondary | ICD-10-CM | POA: Diagnosis present

## 2016-02-03 DIAGNOSIS — Z7982 Long term (current) use of aspirin: Secondary | ICD-10-CM | POA: Diagnosis not present

## 2016-02-03 DIAGNOSIS — E6609 Other obesity due to excess calories: Secondary | ICD-10-CM | POA: Diagnosis present

## 2016-02-03 DIAGNOSIS — E11628 Type 2 diabetes mellitus with other skin complications: Secondary | ICD-10-CM | POA: Diagnosis present

## 2016-02-03 DIAGNOSIS — E1165 Type 2 diabetes mellitus with hyperglycemia: Secondary | ICD-10-CM | POA: Diagnosis present

## 2016-02-03 DIAGNOSIS — M869 Osteomyelitis, unspecified: Secondary | ICD-10-CM | POA: Diagnosis present

## 2016-02-03 DIAGNOSIS — M79675 Pain in left toe(s): Secondary | ICD-10-CM

## 2016-02-03 DIAGNOSIS — E10319 Type 1 diabetes mellitus with unspecified diabetic retinopathy without macular edema: Secondary | ICD-10-CM | POA: Diagnosis present

## 2016-02-03 DIAGNOSIS — E1142 Type 2 diabetes mellitus with diabetic polyneuropathy: Secondary | ICD-10-CM | POA: Diagnosis present

## 2016-02-03 DIAGNOSIS — M7989 Other specified soft tissue disorders: Secondary | ICD-10-CM

## 2016-02-03 DIAGNOSIS — T368X5A Adverse effect of other systemic antibiotics, initial encounter: Secondary | ICD-10-CM | POA: Diagnosis not present

## 2016-02-03 DIAGNOSIS — L03032 Cellulitis of left toe: Secondary | ICD-10-CM | POA: Diagnosis present

## 2016-02-03 DIAGNOSIS — G894 Chronic pain syndrome: Secondary | ICD-10-CM | POA: Diagnosis present

## 2016-02-03 DIAGNOSIS — Z888 Allergy status to other drugs, medicaments and biological substances status: Secondary | ICD-10-CM | POA: Diagnosis not present

## 2016-02-03 LAB — BASIC METABOLIC PANEL
Anion gap: 4 — ABNORMAL LOW (ref 5–15)
BUN: 20 mg/dL (ref 6–20)
CALCIUM: 8.9 mg/dL (ref 8.9–10.3)
CO2: 28 mmol/L (ref 22–32)
CREATININE: 1.1 mg/dL — AB (ref 0.44–1.00)
Chloride: 109 mmol/L (ref 101–111)
GFR calc Af Amer: >60 mL/min (ref >60)
GFR, EST NON AFRICAN AMERICAN: 55 mL/min — AB (ref >60)
GLUCOSE: 111 mg/dL — AB (ref 65–99)
Potassium: 4.8 mmol/L (ref 3.5–5.1)
Sodium: 141 mmol/L (ref 135–145)

## 2016-02-03 LAB — CBC
HEMATOCRIT: 41 % (ref 36.0–46.0)
HEMATOCRIT: 42.6 % (ref 36.0–46.0)
Hemoglobin: 12.8 g/dL (ref 12.0–15.0)
Hemoglobin: 13.3 g/dL (ref 12.0–15.0)
MCH: 26.9 pg (ref 26.0–34.0)
MCH: 27 pg (ref 26.0–34.0)
MCHC: 31.2 g/dL (ref 30.0–36.0)
MCHC: 31.2 g/dL (ref 30.0–36.0)
MCV: 86.1 fL (ref 78.0–100.0)
MCV: 86.4 fL (ref 78.0–100.0)
PLATELETS: 249 10*3/uL (ref 150–400)
Platelets: 243 10*3/uL (ref 150–400)
RBC: 4.76 MIL/uL (ref 3.87–5.11)
RBC: 4.93 MIL/uL (ref 3.87–5.11)
RDW: 14.2 % (ref 11.5–15.5)
RDW: 14.1 % (ref 11.5–15.5)
WBC: 10 10*3/uL (ref 4.0–10.5)
WBC: 9.4 10*3/uL (ref 4.0–10.5)

## 2016-02-03 LAB — GLUCOSE, CAPILLARY
GLUCOSE-CAPILLARY: 106 mg/dL — AB (ref 65–99)
GLUCOSE-CAPILLARY: 126 mg/dL — AB (ref 65–99)
Glucose-Capillary: 88 mg/dL (ref 65–99)
Glucose-Capillary: 90 mg/dL (ref 65–99)

## 2016-02-03 LAB — URINALYSIS, ROUTINE W REFLEX MICROSCOPIC
Bilirubin Urine: NEGATIVE
GLUCOSE, UA: NEGATIVE mg/dL
Ketones, ur: NEGATIVE mg/dL
LEUKOCYTES UA: NEGATIVE
Nitrite: NEGATIVE
PH: 6 (ref 5.0–8.0)
Protein, ur: NEGATIVE mg/dL
Specific Gravity, Urine: 1.013 (ref 1.005–1.030)

## 2016-02-03 LAB — URINE MICROSCOPIC-ADD ON

## 2016-02-03 MED ORDER — SODIUM CHLORIDE 0.45 % IV SOLN
INTRAVENOUS | Status: DC
  Administered 2016-02-03: 18:00:00 via INTRAVENOUS

## 2016-02-03 MED ORDER — DEXTROSE 5 % IV SOLN
1.0000 g | Freq: Three times a day (TID) | INTRAVENOUS | Status: DC
  Administered 2016-02-03 – 2016-02-05 (×6): 1 g via INTRAVENOUS
  Filled 2016-02-03 (×8): qty 1

## 2016-02-03 MED ORDER — DEXTROSE-NACL 5-0.9 % IV SOLN
INTRAVENOUS | Status: DC
  Administered 2016-02-03: 10:00:00 via INTRAVENOUS

## 2016-02-03 MED ORDER — GLYCERIN (LAXATIVE) 2.1 G RE SUPP
1.0000 | Freq: Once | RECTAL | Status: AC
  Administered 2016-02-03: 1 via RECTAL
  Filled 2016-02-03: qty 1

## 2016-02-03 MED ORDER — ATORVASTATIN CALCIUM 40 MG PO TABS
40.0000 mg | ORAL_TABLET | Freq: Every day | ORAL | Status: DC
  Administered 2016-02-03 – 2016-02-04 (×2): 40 mg via ORAL
  Filled 2016-02-03 (×2): qty 1

## 2016-02-03 MED ORDER — HEPARIN SODIUM (PORCINE) 5000 UNIT/ML IJ SOLN: 5000.0000 [IU] | Freq: Three times a day (TID) | INTRAMUSCULAR | Status: DC

## 2016-02-03 MED ORDER — METRONIDAZOLE IN NACL 5-0.79 MG/ML-% IV SOLN
500.0000 mg | Freq: Four times a day (QID) | INTRAVENOUS | Status: DC
  Administered 2016-02-03 – 2016-02-05 (×8): 500 mg via INTRAVENOUS
  Filled 2016-02-03 (×11): qty 100

## 2016-02-03 NOTE — Progress Notes (Signed)
Pt had an incontinent episode of bowel movements this evening

## 2016-02-03 NOTE — Progress Notes (Signed)
Orthopedic doctor called and explained that there will be no surgery so pt can be placed back on a diet. Hospitalist notified

## 2016-02-03 NOTE — Clinical Social Work Note (Signed)
CSW received consult for patient needing additional assistance at home, CSW does not assist with this please consult case manager, CSW to sign off.  Jones Broom. [place], MSW, [place] 02/03/2016 12:10 PM

## 2016-02-03 NOTE — Progress Notes (Signed)
Per infectious diseases,pt does not need to be on contact precautions.

## 2016-02-03 NOTE — Progress Notes (Signed)
Offered pt. A bath . Pt.'s family member refused she said not to. That perhaps we can do a wash cloth for her face but not right now. I inform family member whenever she wanted [place] to do it to call me

## 2016-02-03 NOTE — Progress Notes (Signed)
Family Medicine Teaching Service Daily Progress Note Intern Pager: [phone]  Patient name: Debbie [name] Medical record number: [id number] Date of birth: 1960/08/06 Age: 55 y.o. Gender: female  Primary Care Provider: Angelica Chessman, MD Consultants: Orthopedics  Code Status: FULL  Pt Overview and Major Events to Date:  7/25 Placed in observation for L great toe cellulitis  Assessment and Plan: Manuel Cortese is a 55 y.o. female presenting with L great toe pain and swelling. 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 Needs Arabic language interpreter.  L great toe cellulitis with osteomyelitis: Followed by podiatry outpatient failed treatment with topical mupirocin. WBC 9.4, Afebrile. MRI L foot suspicious for osteomyelitis of distal tip of L great toe - Continue vancomycin (7/25-), start cefepime and flagyl (7/26-) d/t pcn allergy with tolerating cephalosporins in the past per pharmacy - Oxy IR 5mg  q4 PRN pain  - Orthopedic surgery consult, appreciate recommendations. - Courtesy consult to [facility] because seen by Dr Bridgett Larsson for L arteriogram 2 days prior to admission  Hives: Etiology unclear. No difficulty breathing or concern for anaphylaxis.  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 At home on Lantus 100U QHS, Novolog TID, Invokana, and reportedly both saxagliptin and sitagliptin - Sitagliptin not on record at patient's pharmacy, holding - Continue Lantus 50U QHS, saxagliptin, Invokana, gabapentin - SSI - CBG ACHS - adding D5 to IVF since now NPO - consider starting statin  Peripheral arterial disease: Followed by Dr. Bridgett Larsson with vascular surgery. On ASA and Plavix at home.  - Continue home Plavix and ASA  H/o HTN now hypotensive. Likely  On lisinopril-HCTZ and propranolol at home. BP 140/49 on admission, BP now 104/54.  - hold lisinopril-HCTZ - continue  propranolol  Depression/anxiety: Reportedly on both Cymbalta and Zoloft at home.  - Continue Zoloft - holding Cymbalta as duplicate therapy with SSRI and SNRI  FEN/GI: NPO, D5 NS@100cc /hr, Senna, glycerin suppository x1 Prophylaxis: hold heparin  Disposition: Place in observation  Subjective:  Sister is historian with help of interpreter. States patient is feeling constipation this morning, is asking for suppository vs enema today. States they do not want surgery but is amenable with speaking to orthopedic surgery about options.  Objective: Temp:  [98.3 F (36.8 C)-98.7 F (37.1 C)] 98.6 F (37 C) (07/26 0542) Pulse Rate:  [62-88] 88 (07/26 0542) Resp:  [16-18] 18 (07/26 0542) BP: (94-140)/(47-82) 104/54 (07/26 0542) SpO2:  [96 %-100 %] 96 % (07/26 0542) Weight:  [228 lb (103.4 kg)] 228 lb (103.4 kg) (07/25 1511) Physical Exam: General: Sitting in bed in NAD Eyes: Blind bilaterally, wearing sunglasses Cardiovascular: RRR, no murmurs appreciated Respiratory: CTAB, normal WOB on RA Abdomen: Obese, soft, non-tender, non-distended, +BS Extremities: Moving all extremities spontaneously. Erythema and swelling of L great toe. No fluctuance noted. Some tenderness with palpation. +DP pulse. R foot with midtarsal amputation.          Laboratory:  Recent Labs Lab 02/01/16 0817 02/02/16 1153  WBC  --  9.7  HGB 15.3* 13.3  HCT 45.0 41.2  PLT  --  258    Recent Labs Lab 02/01/16 0817 02/02/16 1153  NA 144 140  K 5.5* 4.3  CL 105 105  CO2  --  29  BUN 26* 20  CREATININE 0.80 0.80  CALCIUM  --  9.1  PROT  --  6.4*  BILITOT  --  0.6  ALKPHOS  --  120  ALT  --  40  AST  --  26  GLUCOSE 153* 246*     Imaging/Diagnostic Tests: Mr Foot Left Wo Contrast  Result Date: 02/03/2016 CLINICAL DATA:  Pain, swelling and inflammation of the great toe. EXAM: MRI OF THE LEFT FOREFOOT WITHOUT CONTRAST TECHNIQUE: Multiplanar, multisequence MR imaging was performed. No  intravenous contrast was administered. COMPARISON:  Radiographs 02/02/2016 FINDINGS: There is subcutaneous soft tissue swelling/ edema/fluid suggesting cellulitis. No discrete fluid collection to suggest a drainable abscess. There is diffuse myofasciitis but no definite findings for pyomyositis. There are degenerative changes but no definite MR findings for septic arthritis. Edema like signal abnormality in the distal tuft region of the great toe suspicious for osteomyelitis. IMPRESSION: Diffuse cellulitis but no definite drainable soft tissue abscess. Diffuse myofasciitis without findings for pyomyositis. Findings suspicious for osteomyelitis involving the distal tip of the great toe. Electronically Signed   By: Marijo Sanes M.D.   On: 02/03/2016 08:19  Dg Foot Complete Left  Result Date: 02/02/2016 CLINICAL DATA:  Pain and swelling in the left foot, particularly of the left first and second digits. EXAM: LEFT FOOT - COMPLETE 3+ VIEW COMPARISON:  Left foot radiographs 01/23/2016. FINDINGS: No acute bone or soft tissue abnormalities are present. There is no osseous erosion. Extensive small vessel disease is present throughout the foot. The joints are located. A small plantar calcaneal spur is noted. IMPRESSION: 1. No acute abnormality. 2. Extensive vascular calcifications compatible with diabetes. Electronically Signed   By: San Morelle M.D.   On: 02/02/2016 12:29   Bufford Lope, DO 02/03/2016, 6:44 AM PGY-1, [place] Intern pager: [phone], text pages welcome

## 2016-02-03 NOTE — Progress Notes (Signed)
Called dietary services and requested an early dinner tray

## 2016-02-03 NOTE — Progress Notes (Signed)
Hospitalist paged as pt 's sister wants to know why pt is not allowed to eat at this time. MD called back and explained that per orthopedic doctor's recommendation, pt is to remain npo

## 2016-02-03 NOTE — Consult Note (Signed)
ORTHOPAEDIC CONSULTATION  REQUESTING PHYSICIAN: Zenia Resides, MD  PCP:  Angelica Chessman, MD  Chief Complaint: Left great toe osteomyelitis  HPI: Debbie [name] is a 55 y.o. female who complains of left great toe redness for approximately 2 months. She has been managed by a podiatrist. She has been seen previously by vascular surgery. She has a history of previous right TMA due to foot. She did have a recent CT angiogram of the lower extremity which did show 3 vessel runoff into the left lower extremity. She came to the hospital with worsening erythema to the great toe. X-rays were obtained in the emergency department that showed possible osteomyelitis of the distal phalanx; she was also noted to have arterial calcification of the great toe. An MRI was obtained showing osteomyelitis to the distal phalanx of the great toe. No fevers or chills. No elevated white count.  Past Medical History:  Diagnosis Date  . Blind   . Cataract   . Chest pain    a. ? MI in 2010, [place]->medically managed  . Diabetic foot ulcer ([place])   . Hypertension    x ~ 10 years  . Hypothyroidism   . Obesity   . Type II or unspecified type diabetes mellitus with unspecified complication, uncontrolled    x ~ 10 years   Past Surgical History:  Procedure Laterality Date  . BACK SURGERY    . PERIPHERAL VASCULAR CATHETERIZATION N/A 02/01/2016   Procedure: Abdominal Aortogram w/Lower Extremity;  Surgeon: Conrad Bratenahl, MD;  Location: [facility];  Service: Cardiovascular;  Laterality: N/A;  . TUBAL LIGATION     Social History   Social History  . Marital status: Single    Spouse name: N/A  . Number of children: N/A  . Years of education: N/A   Social History Main Topics  . Smoking status: Never Smoker  . Smokeless tobacco: None  . Alcohol use No  . Drug use: No  . Sexual activity: No   Other Topics Concern  . None   Social History Narrative   From [place], [place].  Moved to [place] to live with  sister in April of 2014.  She does not routinely exercise.   Family History  Problem Relation Age of Onset  . Diabetes Father     died in his 50's?/died in her 64's?  . Diabetes Sister     alive and well.   Allergies  Allergen Reactions  . Penicillins Rash    Has patient had a PCN reaction causing immediate rash, facial/tongue/throat swelling, SOB or lightheadedness with hypotension: Yes Has patient had a PCN reaction causing severe rash involving mucus membranes or skin necrosis: No Has patient had a PCN reaction that required hospitalization No Has patient had a PCN reaction occurring within the last 10 years: No If all of the above answers are "NO", then may proceed with Cephalosporin use.   Marland Kitchen Hydrocortisone Rash    Redness   Prior to Admission medications   Medication Sig Start Date End Date Taking? Authorizing Provider  ACCU-CHEK SOFTCLIX LANCETS lancets  11/17/15  Yes Historical Provider, MD  acetaminophen-codeine (TYLENOL #3) 300-30 MG tablet Take 1 tablet by mouth every 4 (four) hours as needed. Patient taking differently: Take 1 tablet by mouth every 4 (four) hours as needed for moderate pain.  12/24/15  Yes Tresa Garter, MD  canagliflozin (INVOKANA) 100 MG TABS tablet TAKE ONE (1) TABLET BY MOUTH EVERY DAY 02/05/15  Yes Olugbemiga E Jegede,  MD  clopidogrel (PLAVIX) 75 MG tablet Take 1 tablet (75 mg total) by mouth daily. 01/22/16  Yes Conrad Baudette, MD  clotrimazole (LOTRIMIN) 1 % cream APPLY TO AFFECTED AREAS TWICE DAILY Patient taking differently: Apply 1 application topically 2 (two) times daily.  11/10/15  Yes Tresa Garter, MD  diazepam (VALIUM) 2 MG tablet Take 1 tablet (2 mg total) by mouth every 8 (eight) hours as needed for anxiety. 11/05/15  Yes Tresa Garter, MD  DULoxetine (CYMBALTA) 30 MG capsule Take 1 capsule (30 mg total) by mouth daily. 01/22/16  Yes Conrad La Coma, MD  gabapentin (NEURONTIN) 300 MG capsule Take 1 capsule (300 mg total) by mouth 3  (three) times daily. 12/24/15  Yes Olugbemiga E Doreene Burke, MD  glucose blood (ACCU-CHEK AVIVA PLUS) test strip Check blood sugar 3 times daily. 01/28/16  Yes Tresa Garter, MD  insulin aspart (NOVOLOG) 100 UNIT/ML injection SEE NOTES Dispense QH 1 month Patient taking differently: Inject 0-15 Units into the skin See admin instructions. Three times daily per Sliding scale: 121-150, 2 units : 151-200, 3 units : 201-250, 5 units : 251-300 8 units : 301-350, 11 units : 351-400, 15 units 07/23/15  Yes Willeen Niece, MD  insulin glargine (LANTUS) 100 UNIT/ML injection Inject 0.2 mLs (20 Units total) into the skin at bedtime. 12/31/15  Yes Tresa Garter, MD  Insulin Syringe-Needle U-100 (INSULIN SYRINGE 1CC/30GX5/16") 30G X 5/16" 1 ML MISC USE AS DIRECTED WITH LANTUS AND NOVOLOG INJECTIONS 10/08/15  Yes Tresa Garter, MD  Lancets (ACCU-CHEK SOFT TOUCH) lancets Use as instructed 12/31/15  Yes Tresa Garter, MD  lisinopril-hydrochlorothiazide (PRINZIDE,ZESTORETIC) 10-12.5 MG tablet Take 1 tablet by mouth daily. 12/31/15  Yes Tresa Garter, MD  loteprednol (LOTEMAX) 0.5 % ophthalmic suspension Place 1 drop into both eyes 2 (two) times daily. 01/09/14  Yes Tresa Garter, MD  mupirocin ointment (BACTROBAN) 2 % Apply 1 application topically 2 (two) times daily. 01/22/16  Yes Conrad Calypso, MD  propranolol ER (INDERAL LA) 60 MG 24 hr capsule Take 1 capsule (60 mg total) by mouth daily. 07/23/15  Yes Willeen Niece, MD  saxagliptin HCl (ONGLYZA) 2.5 MG TABS tablet Take 2.5 mg by mouth 2 (two) times daily.    Yes Historical Provider, MD  senna (SENOKOT) 8.6 MG TABS tablet Take 2 tablets (17.2 mg total) by mouth at bedtime as needed (for constipation). 07/23/15  Yes Willeen Niece, MD  sertraline (ZOLOFT) 50 MG tablet Take 1 tablet by mouth daily. 11/17/15  Yes Historical Provider, MD  triamcinolone cream (KENALOG) 0.1 % Apply 1 application topically 2 (two) times daily. 11/13/15  Yes Tresa Garter, MD  ciprofloxacin (CIPRO) 500 MG tablet Take 1 tablet (500 mg total) by mouth 2 (two) times daily. 02/01/16 02/11/16  Conrad Cumming, MD   Mr Foot Left Wo Contrast  Result Date: 02/03/2016 CLINICAL DATA:  Pain, swelling and inflammation of the great toe. EXAM: MRI OF THE LEFT FOREFOOT WITHOUT CONTRAST TECHNIQUE: Multiplanar, multisequence MR imaging was performed. No intravenous contrast was administered. COMPARISON:  Radiographs 02/02/2016 FINDINGS: There is subcutaneous soft tissue swelling/ edema/fluid suggesting cellulitis. No discrete fluid collection to suggest a drainable abscess. There is diffuse myofasciitis but no definite findings for pyomyositis. There are degenerative changes but no definite MR findings for septic arthritis. Edema like signal abnormality in the distal tuft region of the great toe suspicious for osteomyelitis. IMPRESSION: Diffuse cellulitis but no definite drainable soft  tissue abscess. Diffuse myofasciitis without findings for pyomyositis. Findings suspicious for osteomyelitis involving the distal tip of the great toe. Electronically Signed   By: Marijo Sanes M.D.   On: 02/03/2016 08:19  Dg Foot Complete Left  Result Date: 02/02/2016 CLINICAL DATA:  Pain and swelling in the left foot, particularly of the left first and second digits. EXAM: LEFT FOOT - COMPLETE 3+ VIEW COMPARISON:  Left foot radiographs 01/23/2016. FINDINGS: No acute bone or soft tissue abnormalities are present. There is no osseous erosion. Extensive small vessel disease is present throughout the foot. The joints are located. A small plantar calcaneal spur is noted. IMPRESSION: 1. No acute abnormality. 2. Extensive vascular calcifications compatible with diabetes. Electronically Signed   By: San Morelle M.D.   On: 02/02/2016 12:29   Positive ROS: All other systems have been reviewed and were otherwise negative with the exception of those mentioned in the HPI and as above.  Physical  Exam: General: Alert, no acute distress Cardiovascular: No pedal edema Respiratory: No cyanosis, no use of accessory musculature GI: No organomegaly, abdomen is soft and non-tender Skin: No lesions in the area of chief complaint Neurologic: Sensation intact distally Psychiatric: Patient is competent for consent with normal mood and affect Lymphatic: No axillary or cervical lymphadenopathy  MUSCULOSKELETAL: Examination of the left foot reveals she has some ischemic changes to the distal phalanx of the great toe. There are no open wounds. She does have palpable pedal pulses. Subjective sensory changes present. Motor function grossly intact.  Assessment: Osteomyelitis left great toe number next atherosclerotic  Microvascular arterial disease  Plan: I had a lengthy discussion with the patient and her sister. They are adamant that at this point in time they do not want to have an amputation. Since she is not systemically ill and there is no wound, I think that is reasonable. Ultimately, I think that the situation will declare itself, and she will require amputation of the great toe. For now, I would recommend appropriate IV antibiotic treatment for osteomyelitis. She may follow up in the office with Dr. Sharol Given in 2 weeks as an outpatient.    Edrie Ehrich, Horald Pollen, MD Cell [phone]    02/03/2016 3:03 PM

## 2016-02-03 NOTE — Plan of Care (Signed)
Problem: Fluid Volume: Goal: Ability to maintain a balanced intake and output will improve Outcome: Progressing Pt's dietary needs met, heart healthy/carb mod diet restarted this pm  Problem: Nutrition: Goal: Adequate nutrition will be maintained Outcome: Progressing Diet resumed  Problem: Bowel/Gastric: Goal: Will not experience complications related to bowel motility Outcome: Progressing Pt had a bowel movement this morning

## 2016-02-03 NOTE — Progress Notes (Signed)
   Daily Progress Note  Per the angiogram on 02/01/16, pt has three vessel runoff to the foot with dominant runoff via AT and PT.  Her problem is digital and tarsal disease in the left foot, for which there is NO endovascular or open surgical intervention.  There is a single digital artery that feed the great toe.  Her disease pattern is consistent with her diabetes which has in the past been uncontrolled, as evident by A1c > 15 in 2016.  - Nothing more else to add to this patient's care - I have already referred her to Dr. Sharol Given for longitudinal wound management of the L great toe - Available as needed  Adele Barthel, MD [facility]: [phone] Pager: [phone]  02/03/2016, 6:09 PM

## 2016-02-03 NOTE — Progress Notes (Signed)
Pharmacy Antibiotic Note  Debbie [name] is a 55 y.o. female admitted on 02/02/2016 with cellulitis and osteomyelitis of R great toe.  Vancomycin started 7/24.  To add cefepime and flagyl today for osteo.  Pt allergic to PCN but has taken cefepime and ceftriaxone during past admissions.  Afebrile, WBC WNL, Scr 0.8>>1.1, Wt 103.4 kg MRI of L foot:  diffuse cellulitis, no definite drainable soft tissue abscess, diffuse myofasciitis, suspicious for osteomyelitis of distal tip of great toe   Plan: - cefepime 1 gm q8h - flagyl 500 mg IV q8h - continue vancomycin 1gm IV Q12H - F/u renal fxn, C&S, clinical status and trough at SS  Height: 5\' 6"  (167.6 cm) (estimated) Weight: 228 lb (103.4 kg) IBW/kg (Calculated) : 59.3  Temp (24hrs), Avg:98.5 F (36.9 C), Min:98.3 F (36.8 C), Max:98.7 F (37.1 C)   Recent Labs Lab 02/01/16 0817 02/02/16 1153 02/03/16 0717 02/03/16 1047  WBC  --  9.7 10.0 9.4  CREATININE 0.80 0.80 1.10*  --     Estimated Creatinine Clearance: 70.2 mL/min (by C-G formula based on SCr of 1.1 mg/dL).    Allergies  Allergen Reactions  . Penicillins Rash    Has patient had a PCN reaction causing immediate rash, facial/tongue/throat swelling, SOB or lightheadedness with hypotension: Yes Has patient had a PCN reaction causing severe rash involving mucus membranes or skin necrosis: No Has patient had a PCN reaction that required hospitalization No Has patient had a PCN reaction occurring within the last 10 years: No If all of the above answers are "NO", then may proceed with Cephalosporin use.   Marland Kitchen Hydrocortisone Rash    Redness    Antimicrobials this admission: Vanc 7/25>> Flagyl 7/26>> Cefepime 7/26>> Dose adjustments this admission: N/A  Microbiology results: None ordered  Thank you for allowing pharmacy to be a part of this patient's care.  Eudelia Bunch, Pharm.D. [id number] 02/03/2016 12:22 PM

## 2016-02-03 NOTE — Progress Notes (Signed)
Pt's sister insists that there is not going to be any surgery done so the pt should be allowed to eat and drink. Hospitalist paged and notified of this concern. Pt remains npo as recommended by orthopedics earlier

## 2016-02-04 ENCOUNTER — Ambulatory Visit: Payer: Medicaid Other | Admitting: Internal Medicine

## 2016-02-04 ENCOUNTER — Inpatient Hospital Stay (HOSPITAL_COMMUNITY): Payer: Self-pay

## 2016-02-04 DIAGNOSIS — L089 Local infection of the skin and subcutaneous tissue, unspecified: Secondary | ICD-10-CM

## 2016-02-04 DIAGNOSIS — Z789 Other specified health status: Secondary | ICD-10-CM

## 2016-02-04 DIAGNOSIS — L03032 Cellulitis of left toe: Secondary | ICD-10-CM

## 2016-02-04 DIAGNOSIS — M79672 Pain in left foot: Secondary | ICD-10-CM

## 2016-02-04 DIAGNOSIS — E1169 Type 2 diabetes mellitus with other specified complication: Secondary | ICD-10-CM

## 2016-02-04 DIAGNOSIS — M7989 Other specified soft tissue disorders: Secondary | ICD-10-CM

## 2016-02-04 LAB — GLUCOSE, CAPILLARY
GLUCOSE-CAPILLARY: 72 mg/dL (ref 65–99)
GLUCOSE-CAPILLARY: 105 mg/dL — AB (ref 65–99)
GLUCOSE-CAPILLARY: 145 mg/dL — AB (ref 65–99)
GLUCOSE-CAPILLARY: 66 mg/dL (ref 65–99)
Glucose-Capillary: 129 mg/dL — ABNORMAL HIGH (ref 65–99)
Glucose-Capillary: 75 mg/dL (ref 65–99)

## 2016-02-04 LAB — CBC
HCT: 40.3 % (ref 36.0–46.0)
Hemoglobin: 12.9 g/dL (ref 12.0–15.0)
MCH: 27.3 pg (ref 26.0–34.0)
MCHC: 32 g/dL (ref 30.0–36.0)
MCV: 85.2 fL (ref 78.0–100.0)
PLATELETS: 242 10*3/uL (ref 150–400)
RBC: 4.73 MIL/uL (ref 3.87–5.11)
RDW: 14 % (ref 11.5–15.5)
WBC: 10.4 10*3/uL (ref 4.0–10.5)

## 2016-02-04 LAB — BASIC METABOLIC PANEL
Anion gap: 7 (ref 5–15)
BUN: 21 mg/dL — AB (ref 6–20)
CALCIUM: 8.8 mg/dL — AB (ref 8.9–10.3)
CHLORIDE: 109 mmol/L (ref 101–111)
CO2: 26 mmol/L (ref 22–32)
CREATININE: 1.02 mg/dL — AB (ref 0.44–1.00)
GFR calc non Af Amer: >60 mL/min (ref >60)
Glucose, Bld: 78 mg/dL (ref 65–99)
Potassium: 3.8 mmol/L (ref 3.5–5.1)
SODIUM: 142 mmol/L (ref 135–145)

## 2016-02-04 LAB — VANCOMYCIN, TROUGH: Vancomycin Tr: 32 ug/mL (ref 15–20)

## 2016-02-04 LAB — MRSA PCR SCREENING: MRSA BY PCR: NEGATIVE

## 2016-02-04 MED ORDER — ENOXAPARIN SODIUM 30 MG/0.3ML ~~LOC~~ SOLN
30.0000 mg | Freq: Two times a day (BID) | SUBCUTANEOUS | Status: DC
  Administered 2016-02-04 – 2016-02-05 (×3): 30 mg via SUBCUTANEOUS
  Filled 2016-02-04 (×3): qty 0.3

## 2016-02-04 MED ORDER — VANCOMYCIN HCL IN DEXTROSE 1-5 GM/200ML-% IV SOLN
1000.0000 mg | INTRAVENOUS | Status: DC
  Administered 2016-02-05: 1000 mg via INTRAVENOUS
  Filled 2016-02-04: qty 200

## 2016-02-04 MED FILL — SULFAMETHOXAZOLE/TMP DS TAB: 800-160 | 10 days supply | Qty: 20 | Fill #0

## 2016-02-04 NOTE — Progress Notes (Signed)
PT Cancellation Note  Patient Details Name: Debbie [name] MRN: [id number] DOB: Mar 05, 1961   Cancelled Treatment:    Reason Eval/Treat Not Completed: Other (comment). Pt with [facility] in room and not available for PT evaluation (attempted to see pt X2 today). Will check back tomorrow for PT evaluation.    Cassell Clement, PT, CSCS Pager [phone] Office [phone]  02/04/2016, 3:25 PM

## 2016-02-04 NOTE — Progress Notes (Signed)
Family Medicine Teaching Service Daily Progress Note Intern Pager: [phone]  Patient name: Debbie [name] Medical record number: [id number] Date of birth: [birth year] Age: 55 y.o. Gender: female  Primary Care Provider: Angelica Chessman, MD Consultants: Orthopedics, Infectious Disease Code Status: FULL  Pt Overview and Major Events to Date:  7/25 Placed in observation for L great toe cellulitis  Assessment and Plan: Debbie [name] is a 55 y.o. female presenting with L great toe pain and swelling. 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 Needs Arabic language interpreter.  L great toe cellulitis with osteomyelitis: Followed by podiatry outpatient failed treatment with topical mupirocin. WBC 9.4, Afebrile. MRI L foot suspicious for osteomyelitis of distal tip of L great toe - Continue vancomycin (7/25-), cefepime and flagyl (7/26-) d/t pcn allergy with tolerating cephalosporins in the past per pharmacy - Oxy IR 5mg  q4 PRN pain  - Orthopedic surgery, recommended surgery which patient and sister refused and opted for IV ABX - Needs PICC and IV antibiotics for 6 weeks. Follow up with Dr. Sharol Given in 2 weeks.  - Consulted ID, appreciate recommendations - per VVF, referred to Dr. Princella Ion, resolved Etiology unclear. No difficulty breathing or concern for anaphylaxis.  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 At home on Lantus 100U QHS, Novolog TID, Invokana, and reportedly both saxagliptin and sitagliptin - Sitagliptin not on record at patient's pharmacy, holding - Continue Lantus 50U QHS, saxagliptin, Invokana, gabapentin - SSI - CBG ACHS  Peripheral arterial disease: Followed by Dr. Bridgett Larsson with vascular surgery. On ASA and Plavix at home, recently started in 12/2015. - Continue home Plavix and ASA  H/o HTN.  On lisinopril-HCTZ and propranolol at home. BP 140/49  on admission, BP now 125/63 - hold lisinopril-HCTZ - continue propranolol  HLD previous VVF notes states patient has been on lipitor - restarted atorvastatin 40mg  qd  Depression/anxiety: Reportedly on both Cymbalta and Zoloft at home.  - Continue Zoloft - holding Cymbalta as duplicate therapy with SSRI and SNRI  FEN/GI: heart health carb modified diet, Senna, glycerin suppository x1 Prophylaxis: lovenox  Disposition: likely home today if PICC placed  Subjective: Sister is historian. States patient feels well this am. Is amenable to going home with PICC and 6 weeks of IV antibiotics. Spoke at length about possibility of still needing surgery in the future, sister voiced good understanding.  Objective: Temp:  [98.2 F (36.8 C)-98.3 F (36.8 C)] 98.2 F (36.8 C) (07/27 0620) Pulse Rate:  [71-74] 72 (07/27 0620) Resp:  [18-19] 19 (07/27 0620) BP: (122-126)/(52-63) 125/63 (07/27 0620) SpO2:  [98 %-99 %] 99 % (07/27 [id number]) Physical Exam:  General: Sitting in bed in NAD Eyes: Blind bilaterally, wearing sunglasses Cardiovascular: RRR, no murmurs appreciated Respiratory: CTAB, normal WOB on RA Abdomen: Obese, soft, non-tender, non-distended, +BS Extremities: Moving all extremities spontaneously. Erythema and swelling of L great toe. No fluctuance noted. Some tenderness with palpation. +DP pulse. R foot with midtarsal amputation.  Skin: no rashes noted         Laboratory:  Recent Labs Lab 02/03/16 0717 02/03/16 1047 02/04/16 0211  WBC 10.0 9.4 10.4  HGB 12.8 13.3 12.9  HCT 41.0 42.6 40.3  PLT 243 249 242    Recent Labs Lab 02/02/16 1153 02/03/16 0717 02/04/16 0211  NA 140 141 142  K 4.3 4.8 3.8  CL 105 109 109  CO2 29 28 26   BUN  20 20 21*  CREATININE 0.80 1.10* 1.02*  CALCIUM 9.1 8.9 8.8*  PROT 6.4*  --   --   BILITOT 0.6  --   --   ALKPHOS 120  --   --   ALT 40  --   --   AST 26  --   --   GLUCOSE 246* 111* 78     Imaging/Diagnostic Tests: Mr  Foot Left Wo Contrast  Result Date: 02/03/2016 CLINICAL DATA:  Pain, swelling and inflammation of the great toe. EXAM: MRI OF THE LEFT FOREFOOT WITHOUT CONTRAST TECHNIQUE: Multiplanar, multisequence MR imaging was performed. No intravenous contrast was administered. COMPARISON:  Radiographs 02/02/2016 FINDINGS: There is subcutaneous soft tissue swelling/ edema/fluid suggesting cellulitis. No discrete fluid collection to suggest a drainable abscess. There is diffuse myofasciitis but no definite findings for pyomyositis. There are degenerative changes but no definite MR findings for septic arthritis. Edema like signal abnormality in the distal tuft region of the great toe suspicious for osteomyelitis. IMPRESSION: Diffuse cellulitis but no definite drainable soft tissue abscess. Diffuse myofasciitis without findings for pyomyositis. Findings suspicious for osteomyelitis involving the distal tip of the great toe. Electronically Signed   By: Marijo Sanes M.D.   On: 02/03/2016 08:19  Dg Foot Complete Left  Result Date: 02/02/2016 CLINICAL DATA:  Pain and swelling in the left foot, particularly of the left first and second digits. EXAM: LEFT FOOT - COMPLETE 3+ VIEW COMPARISON:  Left foot radiographs 01/23/2016. FINDINGS: No acute bone or soft tissue abnormalities are present. There is no osseous erosion. Extensive small vessel disease is present throughout the foot. The joints are located. A small plantar calcaneal spur is noted. IMPRESSION: 1. No acute abnormality. 2. Extensive vascular calcifications compatible with diabetes. Electronically Signed   By: San Morelle M.D.   On: 02/02/2016 12:29   Bufford Lope, DO 02/04/2016, 7:23 AM PGY-1, [facility] Intern pager: [phone], text pages welcome

## 2016-02-04 NOTE — Progress Notes (Signed)
FPTS Social Progress Note Paged by RN that patient and sister had many concerns about PICC line. Spoke with both at length about risks and benefits of going home with PICC line vs surgery. Patient and sister were persistent in asking if patient could stay in hospital for entirety of antibiotic regimen (6 weeks total) vs having home nursing for 24/7 throughout the 6 weeks, I explained to them neither suggestion was feasible or reasonable. They are still very hesitant to proceed with surgery. I explained that the current options for optimal treatment are to have surgery or to go home with PICC and that although they may consider their options, they will need to eventually make a decision. They voiced good understanding and would like to consider both options overnight.   Bufford Lope, DO 02/04/2016, 2:21 PM PGY-1, [facility] Service pager [phone]

## 2016-02-04 NOTE — Progress Notes (Signed)
Used Arabic interpreter via phone to attempt consent for PICC however pt wanted to wait for sister to return from [place] appointment around 12Noon to discuss PICC insertion.    Will check back later.

## 2016-02-04 NOTE — Progress Notes (Signed)
OT Cancellation Note  Patient Details Name: Debbie [name] MRN: [id number] DOB: December 12, 1960   Cancelled Treatment:    Reason Eval/Treat Not Completed: Pt awaiting PICC line placement. Will follow.  Malka So 02/04/2016, 1:28 PM  [phone]

## 2016-02-04 NOTE — Progress Notes (Signed)
PT Cancellation Note  Patient Details Name: Mirely Galkin MRN: [id number] DOB: 11-29-1960   Cancelled Treatment:    Reason Eval/Treat Not Completed: Other (comment) Pt preparing for PICC line placement, not available for evaluation. Will check back as able.    Cassell Clement, PT, CSCS Pager [phone] Office [phone]  02/04/2016, 1:19 PM

## 2016-02-04 NOTE — Discharge Summary (Signed)
[facility] Discharge Summary  Patient name: Debbie [name] Medical record number: [id number] Date of birth: [birth year] Age: 55 y.o. Gender: female Date of Admission: 02/02/2016  Date of Discharge: 02/05/16 Admitting Physician: Zenia Resides, MD  Primary Care Provider: Angelica Chessman, MD Consultants: Infectious Disease  Indication for Hospitalization: L great toe osteomyelitis  Discharge Diagnoses/Problem List:  L great toe osteomyelitis Hives T2DM with neuropathy and retinopathy Peripheral arterial disease HTN HLD Depression/anxiety  Disposition: home  Discharge Condition: Stable but not improved d/t patient declining optimal medical management  Discharge Exam: General:Sitting in bed in NAD, tearful Eyes: Blind bilaterally, wearing sunglasses Cardiovascular: RRR, no murmurs appreciated Respiratory: CTAB, normal WOB on RA Abdomen: Obese, soft, non-tender, non-distended, +BS Extremities: Moving all extremities spontaneously. Erythema and swelling of L great toe. No fluctuance noted. Some tenderness with palpation. +DP pulse. R foot with midtarsal amputation.  Skin: no rashes noted Psych: flat affect, sparse speech  Brief Hospital Course:  [name] a 55 y.o.femalepresenting with L great toe pain and swelling. 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 Needs Arabic language interpreter.  L great toe osteomyelitis Patient has had swelling, redness, and pain in her L great toe for 2 months. Followed by podiatry and failed outpatient mupirocin ointment, no oral antibiotics were tried before admit. Saw podiatrist recently prior to admit and was told L great toe appeared worse than previous visits and to go to ED for IV antibiotics. Also had seen vascular surgery the day before presentation and had CT angiogram showed arterial disease of L foot. During hospital stay,  L foot MRI showed osteomyelitis involving the distal tip of the great toe. Orthopedic surgery was consulted, patient declined amputation even though she was made aware that it was the best course of treatment. Home with IV antibiotics for 6 weeks was offered as the next best option but the patient declined PICC placement. Patient and her sister persistently requested to stay in the hospital for full 6 weeks of IV antibiotics, they were made aware that is was not an option. The compromise was reached to go home with oral antibiotics was reached, the patient and the sister were made aware through an interpreter that this was not a reliable or a optimal course of treatment. They voiced good understanding and the patient still insisted on going home. Per ID, oral levoquin 750mg  Q D and bactrim DS 800-160mg  BID were started on discharge for 6 weeks.  AKI  Patient was treated on vancomycin from 02/02/16 to 02/05/16 with worsening of her creatinine from a baseline of 0.8 to 1.3 on discharge. It was therefore decided by ID that patient would not tolerate Bactrim DS 2 tab on 800-160mg  BID and would be discharged on bactrim 1 tablet bid with twice weekly BMP monitoring.  The remainder of her chronic medical conditions were stable and changes were made to her  home medications.   Issues for Follow Up:  1. Please monitor L great toe for signs of worsening osteomyelitis given patient declined both surgery and IV antibiotics and was sent home on oral levaquin and bactrim for 6 weeks. 2. Patient's creatinine increased from baseline of 0.8 to 1.3 likely from being on vancomycin, since on Bactrim for 6 weeks, please monitor twice weekly BMP per Infectious Disease recommendations. 3. Patient's BP was in mid 120s/60s so her home lisinopril-HCTZ was held during her hospital stay and on discharge.  Significant Procedures: none  Significant Labs and Imaging:   Recent Labs Lab 02/03/16 1047 02/04/16 0211 02/05/16 0301   WBC 9.4 10.4 11.0*  HGB 13.3 12.9 12.3  HCT 42.6 40.3 38.2  PLT 249 242 233    Recent Labs Lab 02/01/16 0817 02/02/16 1153 02/03/16 0717 02/04/16 0211 02/05/16 0301  NA 144 140 141 142 142  K 5.5* 4.3 4.8 3.8 4.7  CL 105 105 109 109 107  CO2  --  29 28 26 30   GLUCOSE 153* 246* 111* 78 116*  BUN 26* 20 20 21* 25*  CREATININE 0.80 0.80 1.10* 1.02* 1.30*  CALCIUM  --  9.1 8.9 8.8* 8.7*  ALKPHOS  --  120  --   --   --   AST  --  26  --   --   --   ALT  --  40  --   --   --   ALBUMIN  --  3.4*  --   --   --    Mr Foot Left Wo Contrast  Result Date: 02/03/2016 CLINICAL DATA:  Pain, swelling and inflammation of the great toe. EXAM: MRI OF THE LEFT FOREFOOT WITHOUT CONTRAST TECHNIQUE: Multiplanar, multisequence MR imaging was performed. No intravenous contrast was administered. COMPARISON:  Radiographs 02/02/2016 FINDINGS: There is subcutaneous soft tissue swelling/ edema/fluid suggesting cellulitis. No discrete fluid collection to suggest a drainable abscess. There is diffuse myofasciitis but no definite findings for pyomyositis. There are degenerative changes but no definite MR findings for septic arthritis. Edema like signal abnormality in the distal tuft region of the great toe suspicious for osteomyelitis. IMPRESSION: Diffuse cellulitis but no definite drainable soft tissue abscess. Diffuse myofasciitis without findings for pyomyositis. Findings suspicious for osteomyelitis involving the distal tip of the great toe. Electronically Signed   By: Marijo Sanes M.D.   On: 02/03/2016 08:19  Dg Foot Complete Left  Result Date: 02/02/2016 CLINICAL DATA:  Pain and swelling in the left foot, particularly of the left first and second digits. EXAM: LEFT FOOT - COMPLETE 3+ VIEW COMPARISON:  Left foot radiographs 01/23/2016. FINDINGS: No acute bone or soft tissue abnormalities are present. There is no osseous erosion. Extensive small vessel disease is present throughout the foot. The joints are  located. A small plantar calcaneal spur is noted. IMPRESSION: 1. No acute abnormality. 2. Extensive vascular calcifications compatible with diabetes. Electronically Signed   By: San Morelle M.D.   On: 02/02/2016 12:29   Results/Tests Pending at Time of Discharge: none  Discharge Medications:    Medication List    STOP taking these medications   ciprofloxacin 500 MG tablet Commonly known as:  CIPRO   lisinopril-hydrochlorothiazide 10-12.5 MG tablet Commonly known as:  PRINZIDE,ZESTORETIC   mupirocin ointment 2 % Commonly known as:  BACTROBAN     TAKE these medications   ACCU-CHEK SOFTCLIX LANCETS lancets   accu-chek soft touch lancets Use as instructed   acetaminophen-codeine 300-30 MG tablet Commonly known as:  TYLENOL #3 Take 1 tablet by mouth every 4 (four) hours as needed. What changed:  reasons to take this   aspirin 81 MG EC tablet Take 1 tablet (81 mg total) by mouth daily.   atorvastatin 40 MG tablet Commonly known as:  LIPITOR Take 1 tablet (40 mg total) by mouth daily at 8 pm.   canagliflozin 100 MG Tabs tablet Commonly known as:  INVOKANA TAKE ONE (1) TABLET BY MOUTH EVERY DAY   clopidogrel 75 MG tablet Commonly known as:  PLAVIX Take 1  tablet (75 mg total) by mouth daily.   clotrimazole 1 % cream Commonly known as:  LOTRIMIN APPLY TO AFFECTED AREAS TWICE DAILY What changed:  how much to take  how to take this  when to take this  additional instructions   diazepam 2 MG tablet Commonly known as:  VALIUM Take 1 tablet (2 mg total) by mouth every 8 (eight) hours as needed for anxiety.   DULoxetine 30 MG capsule Commonly known as:  CYMBALTA Take 1 capsule (30 mg total) by mouth daily.   gabapentin 300 MG capsule Commonly known as:  NEURONTIN Take 1 capsule (300 mg total) by mouth 3 (three) times daily.   glucose blood test strip Commonly known as:  ACCU-CHEK AVIVA PLUS Check blood sugar 3 times daily.   insulin aspart 100  UNIT/ML injection Commonly known as:  NOVOLOG SEE NOTES Dispense QH 1 month What changed:  how much to take  how to take this  when to take this  additional instructions   insulin glargine 100 UNIT/ML injection Commonly known as:  LANTUS Inject 0.2 mLs (20 Units total) into the skin at bedtime.   INSULIN SYRINGE 1CC/30GX5/16" 30G X 5/16" 1 ML Misc USE AS DIRECTED WITH LANTUS AND NOVOLOG INJECTIONS   levofloxacin 750 MG tablet Commonly known as:  LEVAQUIN Take 1 tablet (750 mg total) by mouth daily.   loteprednol 0.5 % ophthalmic suspension Commonly known as:  LOTEMAX Place 1 drop into both eyes 2 (two) times daily.   propranolol ER 60 MG 24 hr capsule Commonly known as:  INDERAL LA Take 1 capsule (60 mg total) by mouth daily.   saxagliptin HCl 2.5 MG Tabs tablet Commonly known as:  ONGLYZA Take 2.5 mg by mouth 2 (two) times daily.   senna 8.6 MG Tabs tablet Commonly known as:  SENOKOT Take 2 tablets (17.2 mg total) by mouth at bedtime as needed (for constipation).   sertraline 50 MG tablet Commonly known as:  ZOLOFT Take 1 tablet by mouth daily.   sulfamethoxazole-trimethoprim 800-160 MG tablet Commonly known as:  BACTRIM DS,SEPTRA DS Take 1 tablet by mouth every 12 (twelve) hours.   triamcinolone cream 0.1 % Commonly known as:  KENALOG Apply 1 application topically 2 (two) times daily.       Discharge Instructions: Please refer to Patient Instructions section of EMR for full details.  Patient was counseled important signs and symptoms that should prompt return to medical care, changes in medications, dietary instructions, activity restrictions, and follow up appointments.   Follow-Up Appointments: Follow-up Information    DUDA,MARCUS V, MD. Schedule an appointment as soon as possible for a visit in 2 week(s).   Specialty:  Orthopedic Surgery Why:  call as soon as possible to schedule a follow up appointment Contact information: [place] [address] [phone]        Angelica Chessman, MD Follow up on 02/11/2016.   Specialty:  Internal Medicine Why:  Hospital follow-up appointment on 02/11/16 at 9:00 am with Dr. Doreene Burke. Contact information: [place] [address] [phone]           Aryaman Haliburton J Audi Wettstein, DO 02/05/2016, 8:40 PM PGY-1, [place]

## 2016-02-04 NOTE — Progress Notes (Signed)
Pt's sister informed this RN that the pt does not want to have anything done.  Pt's sister stated that the pt does not want surgery and she does not want to go home with IV antibiotics.  MD made aware and stated that they would talk to pt and sister in the morning.  Debbie [name]

## 2016-02-04 NOTE — Progress Notes (Signed)
Pharmacy Antibiotic Note  Debbie [name] is a 55 y.o. female admitted on 02/02/2016 with cellulitis and osteomyelitis of R great toe.  Vancomycin started 7/24.  Cefepime and flagyl added 7/26 for osteo.  Pt allergic to PCN but has taken cefepime and ceftriaxone during past admissions.   Afebrile, WBC WNL, Scr 0.8>>1.05, Wt 103.4 kg MRI of L foot:  diffuse cellulitis, no definite drainable soft tissue abscess, diffuse myofasciitis, suspicious for osteomyelitis of distal tip of great toe.   Vancomycin trough is higher than goal at 32 mcg/ml after 2 gm loading dose and 3 doses of vancomycin 1 gm q8h, goal 15-20 mcg/ml for osteo.  To get PICC line for home abx.    Plan: decrease vancomycin to 1 gm IV q24, next dose to be 7/28 am - cefepime 1 gm q8h - flagyl 500 mg IV q8h- could probably give PO - f/u ID consult recs  Height: 5\' 6"  (167.6 cm) (estimated) Weight: 228 lb (103.4 kg) IBW/kg (Calculated) : 59.3  Temp (24hrs), Avg:98.3 F (36.8 C), Min:98.2 F (36.8 C), Max:98.3 F (36.8 C)   Recent Labs Lab 02/01/16 0817 02/02/16 1153 02/03/16 0717 02/03/16 1047 02/04/16 0211 02/04/16 1158  WBC  --  9.7 10.0 9.4 10.4  --   CREATININE 0.80 0.80 1.10*  --  1.02*  --   VANCOTROUGH  --   --   --   --   --  32*    Estimated Creatinine Clearance: 75.7 mL/min (by C-G formula based on SCr of 1.02 mg/dL).    Allergies  Allergen Reactions  . Penicillins Rash    Has patient had a PCN reaction causing immediate rash, facial/tongue/throat swelling, SOB or lightheadedness with hypotension: Yes Has patient had a PCN reaction causing severe rash involving mucus membranes or skin necrosis: No Has patient had a PCN reaction that required hospitalization No Has patient had a PCN reaction occurring within the last 10 years: No If all of the above answers are "NO", then may proceed with Cephalosporin use.   Marland Kitchen Hydrocortisone Rash    Redness    Antimicrobials this admission: Vanc 7/25>> Flagyl  7/26>> Cefepime 7/26>> Dose adjustments this admission: 7/27 VT = 32, drawn at noon, dose due 1300, after  vanc 2 gm x1 then 3 doses of vanc 1 gm q8h; last dose 7/27 at 01 am - decrease dose to 1 gm q24  Microbiology results: None ordered  Thank you for allowing pharmacy to be a part of this patient's care.  Eudelia Bunch, Pharm.D. [id number] 02/04/2016 1:42 PM

## 2016-02-04 NOTE — Progress Notes (Signed)
Arrived to insert PICC.   Sister was still not back from her doctor's appointment.   The patient was ok with me communicating with her via the interpreter phone service without sister present.    While I was explaining the PICC procedure using the interpreter line her sister arrived.  She began asking many questions that I as the PICC nurse was not able to answer.  I got her primary RN, Ria Comment involved.   We then ended up paging the resident MD.  The resident arrived shortly after being paged and between myself and the resident spent a great deal of time answering questions using the interpreter line.  Dr. Linus Salmons with infectious disease also came in and spent time answering their questions also.  They had many concerns regarding taking care of the PICC and giving the medications at home without a nurse present all the time.  After all of the discussions and questions being answered the pt and sister decided they will made a decision in the morning regarding PICC insertion and going home or having surgery.  The [place] nurse is coming shortly to talk with them regarding what is involved with home antibiotics and PICC care.  I encouraged the pt and sister to talk with her because she could answer a lot of their questions regarding what is entailed with the home care aspect.

## 2016-02-04 NOTE — Consult Note (Signed)
[name]       Reason for Consult: osteomyelitis    Referring Physician: Dr. Andria Frames  Active Problems:   Cellulitis   Cellulitis of toe of left foot   Atherosclerotic peripheral vascular disease ([place])   Failure of outpatient treatment   Pain and swelling of toe of left foot   . antiseptic oral rinse  7 mL Mouth Rinse BID  . aspirin EC  81 mg Oral Daily  . atorvastatin  40 mg Oral Q2000  . canagliflozin  100 mg Oral QAC breakfast  . ceFEPime (MAXIPIME) IV  1 g Intravenous Q8H  . clopidogrel  75 mg Oral Daily  . enoxaparin (LOVENOX) injection  30 mg Subcutaneous Q12H  . gabapentin  300 mg Oral TID  . insulin aspart  0-20 Units Subcutaneous TID WC  . insulin aspart  0-5 Units Subcutaneous QHS  . insulin aspart  4 Units Subcutaneous TID WC  . insulin glargine  50 Units Subcutaneous QHS  . linagliptin  5 mg Oral Daily  . loteprednol  1 drop Both Eyes BID  . metronidazole  500 mg Intravenous Q6H  . propranolol ER  60 mg Oral Daily  . sertraline  50 mg Oral Daily  . vancomycin  1,000 mg Intravenous Q12H    Recommendations: If they decide on IV antibiotics: Vancomycin with trough goal of 15-20 levaquin 750 mg po daily picc line Home health  Antibiotics through September 4th  If she decides on antibiotics, I will arrange follow up in our clinic  Assessment: She has osteomyelitis on MRI of toe and refused definitive therapy.  Prefers attempt at salvage with antibiotic treatment.  At this time though, the patient and family members are going to think about it overnight with the two options of IV antibiotics vs amputation.      Antibiotics: Vancomycin, cefepime, flagyl  HPI: Debbie [name] is a 55 y.o. female with DM 2, PVD and red great toe infection now found to be osteomyelitis and was seen by Dr. Lyla Glassing who recommended ampuation but was refused.  I was called to discuss IV antibiotic treatment, though the patient and family are still  decided on the best option for her.  No fever, no chills.  Has been on broad spectrum antibiotics.  No n/v.   MRI independently reviewed and noted area   Review of Systems:  Constitutional: negative for fevers and chills Gastrointestinal: negative for diarrhea Musculoskeletal: negative for myalgias and arthralgias All other systems reviewed and are negative   Past Medical History:  Diagnosis Date  . Blind   . Cataract   . Chest pain    a. ? MI in 2010, [place]->medically managed  . Diabetic foot ulcer ([place])   . Hypertension    x ~ 10 years  . Hypothyroidism   . Obesity   . Type II or unspecified type diabetes mellitus with unspecified complication, uncontrolled    x ~ 10 years    Social History  Substance Use Topics  . Smoking status: Never Smoker  . Smokeless tobacco: Not on file  . Alcohol use No    Family History  Problem Relation Age of Onset  . Diabetes Father     died in his 50's?/died in her 18's?  . Diabetes Sister     alive and well.    Allergies  Allergen Reactions  . Penicillins Rash    Has patient had a PCN reaction causing immediate rash, facial/tongue/throat swelling, SOB or  lightheadedness with hypotension: Yes Has patient had a PCN reaction causing severe rash involving mucus membranes or skin necrosis: No Has patient had a PCN reaction that required hospitalization No Has patient had a PCN reaction occurring within the last 10 years: No If all of the above answers are "NO", then may proceed with Cephalosporin use.   Marland Kitchen Hydrocortisone Rash    Redness    Physical Exam: Constitutional: in no apparent distress and alert  Vitals:   02/03/16 2121 02/04/16 0620  BP: (!) 126/52 125/63  Pulse: 74 72  Resp: 19 19  Temp: 98.3 F (36.8 C) 98.2 F (36.8 C)   EYES: anicteric ENMT: no thrush Cardiovascular: Cor RRR Respiratory: normal respiratory effort; GI: Bowel sounds are normal Musculoskeletal: no pedal edema noted Skin: negatives: no  rash Neuro: non focal  Lab Results  Component Value Date   WBC 10.4 02/04/2016   HGB 12.9 02/04/2016   HCT 40.3 02/04/2016   MCV 85.2 02/04/2016   PLT 242 02/04/2016    Lab Results  Component Value Date   CREATININE 1.02 (H) 02/04/2016   BUN 21 (H) 02/04/2016   NA 142 02/04/2016   K 3.8 02/04/2016   CL 109 02/04/2016   CO2 26 02/04/2016    Lab Results  Component Value Date   ALT 40 02/02/2016   AST 26 02/02/2016   ALKPHOS 120 02/02/2016     Microbiology: No results found for this or any previous visit (from the past 240 hour(s)).  Scharlene Gloss, [facility] [place] [email or web address] [phone] pager  [phone] cell 02/04/2016, 1:26 PM

## 2016-02-04 NOTE — Progress Notes (Signed)
ABI order received. Patient had recent ABI completed 12/2015, results can be found in [facility]. Please advise if repeat is necessary.  02/04/2016 9:58 AM Maudry Mayhew, B.S., RVT, RDCS, RDMS

## 2016-02-04 NOTE — Care Management Note (Signed)
Case Management Note  Patient Details  Name: Lizzete Hossfeld MRN: [id number] Date of Birth: 02-22-1961  Subjective/Objective:                    Action/Plan:   Expected Discharge Date:  02/05/16               Expected Discharge Plan:  [place]  In-House Referral:     Discharge planning Services  CM Consult  Post Acute Care Choice:  Home Health Choice offered to:  Patient, Sibling  DME Arranged:    DME Agency:     HH Arranged:  RN, PT, OT, Nurse's Aide [place] Agency:  [facility]  Status of Service:  In process, will continue to follow  If discussed at Long Length of Stay Meetings, dates discussed:    Additional Comments:  Patient and sister are going to discuss home health for 6 weeks IV ABX or amputation and make decision tomorrow. Pam with [facility] will also discuss home IV ABX with patient and sister  Marilu Favre, RN 02/04/2016, 3:12 PM

## 2016-02-04 NOTE — Progress Notes (Signed)
OT Cancellation Note  Patient Details Name: Christeen Love MRN: [id number] DOB: 06/25/1961   Cancelled Treatment:    Reason Eval/Treat Not Completed:  Pt working with [facility] on PICC line management, not available. Will follow.  Malka So 02/04/2016, 4:18 PM

## 2016-02-05 ENCOUNTER — Telehealth: Payer: Self-pay

## 2016-02-05 LAB — GLUCOSE, CAPILLARY
GLUCOSE-CAPILLARY: 73 mg/dL (ref 65–99)
Glucose-Capillary: 115 mg/dL — ABNORMAL HIGH (ref 65–99)

## 2016-02-05 LAB — BASIC METABOLIC PANEL
Anion gap: 5 (ref 5–15)
BUN: 25 mg/dL — ABNORMAL HIGH (ref 6–20)
CALCIUM: 8.7 mg/dL — AB (ref 8.9–10.3)
CHLORIDE: 107 mmol/L (ref 101–111)
CO2: 30 mmol/L (ref 22–32)
CREATININE: 1.3 mg/dL — AB (ref 0.44–1.00)
GFR calc Af Amer: 53 mL/min — ABNORMAL LOW (ref >60)
GFR calc non Af Amer: 45 mL/min — ABNORMAL LOW (ref >60)
GLUCOSE: 116 mg/dL — AB (ref 65–99)
Potassium: 4.7 mmol/L (ref 3.5–5.1)
Sodium: 142 mmol/L (ref 135–145)

## 2016-02-05 LAB — CBC
HCT: 38.2 % (ref 36.0–46.0)
HEMOGLOBIN: 12.3 g/dL (ref 12.0–15.0)
MCH: 27.2 pg (ref 26.0–34.0)
MCHC: 32.2 g/dL (ref 30.0–36.0)
MCV: 84.3 fL (ref 78.0–100.0)
PLATELETS: 233 10*3/uL (ref 150–400)
RBC: 4.53 MIL/uL (ref 3.87–5.11)
RDW: 14.1 % (ref 11.5–15.5)
WBC: 11 10*3/uL — ABNORMAL HIGH (ref 4.0–10.5)

## 2016-02-05 MED ORDER — LEVOFLOXACIN 750 MG PO TABS
750.0000 mg | ORAL_TABLET | Freq: Every day | ORAL | Status: DC
  Administered 2016-02-05: 750 mg via ORAL
  Filled 2016-02-05: qty 1

## 2016-02-05 MED ORDER — SULFAMETHOXAZOLE-TRIMETHOPRIM 800-160 MG PO TABS: 1.0000 | ORAL_TABLET | Freq: Two times a day (BID) | ORAL | 0 refills | Status: DC

## 2016-02-05 MED ORDER — LEVOFLOXACIN 750 MG PO TABS: 750.0000 mg | ORAL_TABLET | Freq: Every day | ORAL | 0 refills | Status: DC

## 2016-02-05 MED ORDER — ATORVASTATIN CALCIUM 40 MG PO TABS: 40.0000 mg | ORAL_TABLET | Freq: Every day | ORAL | 0 refills | Status: DC

## 2016-02-05 MED ORDER — SULFAMETHOXAZOLE-TRIMETHOPRIM 800-160 MG PO TABS
1.0000 | ORAL_TABLET | Freq: Two times a day (BID) | ORAL | Status: DC
  Administered 2016-02-05: 1 via ORAL
  Filled 2016-02-05: qty 1

## 2016-02-05 MED ORDER — ASPIRIN 81 MG PO TBEC: 81.0000 mg | DELAYED_RELEASE_TABLET | Freq: Every day | ORAL | 0 refills | Status: DC

## 2016-02-05 NOTE — Evaluation (Signed)
Physical Therapy Evaluation and D/C Patient Details Name: Debbie [name] MRN: [id number] DOB: June 20, 1961 Today's Date: 02/05/2016   History of Present Illness  Pt admitted with cellulitis with osteomyelitis of L great toe. PMH: DM, neuropathy, retinopathy with blindness, HTN, anxiety and depression, PAD.  Clinical Impression  Pt admitted with above diagnosis. Pt currently with functional limitations due to the deficits listed below (see PT Problem List). Sister states pt is at baseline.  Pt currently mod assist for transfers.  Tried to recommend some things to assist sister.   Called CM as sister's needs really need to be addressed in the home.  No further inpatient needs.  Pt appears to be at baseline.   Follow Up Recommendations No PT follow up;Supervision/Assistance - 24 hour    Equipment Recommendations  Hospital bed repair (gait belt and hoyer lift (sister refuses these at present))    Recommendations for Other Services       Precautions / Restrictions Precautions Precautions: Fall Restrictions Weight Bearing Restrictions: No      Mobility  Bed Mobility               General bed mobility comments: pt in chair  Transfers Overall transfer level: Needs assistance Equipment used: Rolling walker (2 wheeled) Transfers: Sit to/from Stand Sit to Stand: +2 physical assistance;Mod assist         General transfer comment: assist to rise and for hand placement, very flexed posture in standing, unable to step with walker and assist, fearful of falling and LOB posteriorly assisting pt back to chair.    Ambulation/Gait             General Gait Details: unable  Stairs            Wheelchair Mobility    Modified Rankin (Stroke Patients Only)       Balance Overall balance assessment: Needs assistance Sitting-balance support: Bilateral upper extremity supported;Feet supported Sitting balance-Leahy Scale: Poor Sitting balance - Comments: relieso n UE support  for sitting balance.  Postural control: Posterior lean                                   Pertinent Vitals/Pain Pain Assessment: No/denies pain Faces Pain Scale: No hurt  VSS    Home Living Family/patient expects to be discharged to:: Private residence Living Arrangements: Other relatives (sister) Available Help at Discharge: Family Type of Home: Apartment Home Access: Stairs to enter   Technical brewer of Steps: 3 Home Layout: One level Home Equipment: Wheelchair - manual;Bedside commode;Hospital bed Additional Comments: hospital bed has a broken rail, sister cannot afford to pay for service visit, CM notified    Prior Function Level of Independence: Needs assistance   Gait / Transfers Assistance Needed: requires 1-2 person assist short distances and wheelchair level for most part  ADL's / Homemaking Assistance Needed: pt requires total care         Hand Dominance   Dominant Hand: Left    Extremity/Trunk Assessment   Upper Extremity Assessment: Defer to OT evaluation           Lower Extremity Assessment: Generalized weakness      Cervical / Trunk Assessment:  (flexed posture in standing)  Communication   Communication: Prefers language other than English (Arabic, sister interpreted)  Cognition Arousal/Alertness: Awake/alert Behavior During Therapy: Flat affect;Anxious Overall Cognitive Status: Difficult to assess  General Comments General comments (skin integrity, edema, etc.): Talked with pt sister who was in room about home set up.  Sister states she uses wheelchair for pt because it is easiest.  After trying to use RW, this PT agrees that pt is at wheelchair level.  Pt has a hospital bed that she would like [facility] to come and look at rail.  CM notified.  CM also notified of fact that pt sister would benefit from aide services if she will qualify.  Lastly discussed with sister that she could use gait belt for  transfers to ease burden or hoyer lift and sister appreciates but does not want thiis equipment yet.      Exercises        Assessment/Plan    PT Assessment Patent does not need any further PT services  PT Diagnosis Generalized weakness;Difficulty walking   PT Problem List    PT Treatment Interventions     PT Goals (Current goals can be found in the Care Plan section) Acute Rehab PT Goals Patient Stated Goal: unable to state PT Goal Formulation: All assessment and education complete, DC therapy    Frequency     Barriers to discharge        Co-evaluation PT/OT/SLP Co-Evaluation/Treatment: Yes Reason for Co-Treatment: For patient/therapist safety PT goals addressed during session: Mobility/safety with mobility OT goals addressed during session: Proper use of Adaptive equipment and DME       End of Session Equipment Utilized During Treatment: Gait belt Activity Tolerance: Patient limited by fatigue Patient left: in chair;with call bell/phone within reach;with family/visitor present Nurse Communication: Mobility status         Time: 1203-1220 PT Time Calculation (min) (ACUTE ONLY): 17 min   Charges:   PT Evaluation $PT Eval Moderate Complexity: 1 Procedure     PT G CodesIrwin Brakeman F 2016-02-16, 4:58 PM  [facility] Acute Rehabilitation [phone] (pager)

## 2016-02-05 NOTE — Discharge Instructions (Signed)
You were found to have a bone infection in the L big toe called osteomyelitis. We recommended surgery as the best option but you have opted for medical management for now and have declined IV antibiotics. You have chosen to proceed with oral antibiotics: Please continue with levoquin 750mg  daily by mouth and twice daily Bactrim 800-160mg  1 tablet twice a day by mouth, both for a total of 6 weeks. Since you have declined optimal medical management, please seek medical attention if left toe does not improve for becomes purple/dark in color, if your left foot becomes red and swollen, a wound develops, pus starts draining, you develop fever/chills, or your toe gives off a foul odor.   Please stop taking your lisinopril-hydrochlorothiazide until you see your primary care physician because your blood pressure was low in the hospital.   Bone and Joint Infections, Adult Bone infections (osteomyelitis) and joint infections (septic arthritis) occur when bacteria or other germs get inside a bone or a joint. This can happen if you have an infection in another part of your body that spreads through your blood. Germs from your skin or from outside of your body can also cause this type of infection if you have a wound or a broken bone (fracture) that breaks the skin. Anyone can get a bone infection or joint infection. You may be more likely to get this type of infection if you have a condition, such as diabetes, that lowers your ability to fight infection or increases your chances of getting an infection. Bone and joint infections can cause damage, and they can spread to other areas of your body. They need to be treated quickly. CAUSES Most bone and joint infections are caused by bacteria. They can also be caused by other germs, such as viruses and funguses. RISK FACTORS This condition is more likely to develop in:  People who recently had surgery, especially bone or joint surgery.  People who have a long-term  (chronic) disease, such as:  HIV (human immunodeficiency virus).  Diabetes.  Rheumatoid arthritis.  Sickle cell anemia.  Elderly people.  People who take medicines that block or weaken the body's defense system (immune system).  People who have a condition that reduces their blood flow.  People who are on kidney dialysis.  People who have an artificial joint.  People who have had a joint or bone repaired with plates or screws (surgical hardware).  People who use or abuse IV drugs.  People who have had trauma, such as stepping on a nail. SYMPTOMS Symptoms vary depending on the type and location of your infection. Common symptoms of bone and joint infections include:  Fever and chills.  Redness and warmth.  Swelling.  Pain and stiffness.  Drainage of fluid or pus near the infection.  Weight loss and fatigue.  Decreased ability to use a hand or foot. DIAGNOSIS This condition may be diagnosed based on symptoms, medical history, a physical exam, and diagnostic tests. Tests can help to identify the cause of the infection. You may have various tests, such as:  A sample of tissue, fluid, or blood taken to be examined under a microscope.  A procedure to remove fluid from the infected joint with a needle (joint aspiration) for testing in a lab.  Pus or discharge swabbed from a wound for testing to identify germs and to determine what type of medicine will kill them (culture and sensitivity).  Blood tests to look for evidence of infection and inflammation (biomarkers).  Imaging studies  to determine how severe the bone or joint infection is. These may include:  X-rays.  CT scan.  MRI.  Bone scan. TREATMENT Treatment depends on the cause and type of infection. Antibiotic medicines are usually the first treatment for a bone or joint infection. Treatment with antibiotics may include:  Getting IV antibiotics. This may be done in a hospital at first. You may have to  continue IV antibiotics at home for several weeks. You may also have to take antibiotics by mouth for several weeks after that.  Taking more than one kind of antibiotic. Treatment may start with a type of antibiotic that works against many different bacteria (broad spectrumantibiotics). IV antibiotics may be changed if tests show that another type may work better. Other treatments may include:  Draining fluid from the joint by placing a needle into it (aspiration).  Surgery to remove:  Dead or dying tissue from a bone or joint.  An infected artificial joint.  Infected plates or screws that were used to repair a broken bone. HOME CARE INSTRUCTIONS  Take medicines only as directed by your health care provider.  Take your antibiotic medicine as directed by your health care provider. Finish the antibiotic even if you start to feel better.  Follow instructions from your health care provider about how to take IV antibiotics at home.  Ask your health care provider if you have any restrictions on your activities.  Keep all follow-up visits as directed by your health care provider. This is important. SEEK MEDICAL CARE IF:  You have a fever or chills.  You have redness, warmth, pain, or swelling that returns after treatment. SEEK IMMEDIATE MEDICAL CARE IF:  You have rapid breathing or you have trouble breathing.  You have chest pain.  You cannot drink fluids or make urine.  The affected arm or leg swells, changes color, or turns blue.   This information is not intended to replace advice given to you by your health care provider. Make sure you discuss any questions you have with your health care provider.   Document Released: 06/27/2005 Document Revised: 11/11/2014 Document Reviewed: 06/25/2014 Elsevier Interactive Patient Education [employer].

## 2016-02-05 NOTE — Progress Notes (Signed)
Called and spoke with primary RN, Suezanne Jacquet regarding PICC insertion for this patient.   He asked the sister if they wanted the PICC inserted and the answer was no.  I made it clear that the PICC probably would not be inserted today if they changed their mind.  Suezanne Jacquet made this clear to the sister who verbalized understanding.

## 2016-02-05 NOTE — Progress Notes (Signed)
Pt discharged home via taxi in stable condition. Discharge instructions provided with no concerns voiced

## 2016-02-05 NOTE — Evaluation (Signed)
Occupational Therapy Evaluation and Discharge Patient Details Name: Debbie [name] MRN: [id number] DOB: 1960-10-08 Today's Date: 02/05/2016    History of Present Illness Pt admitted with cellulitis with osteomyelitis of L great toe. PMH: DM, neuropathy, retinopathy with blindness, HTN, anxiety and depression, PAD.   Clinical Impression   Pt was dependent in mobility, primarily using a w/c, and in all ADL. No acute OT needs.    Follow Up Recommendations  No OT follow up;Supervision/Assistance - 24 hour    Equipment Recommendations  None recommended by OT    Recommendations for Other Services       Precautions / Restrictions Precautions Precautions: Fall Restrictions Weight Bearing Restrictions: No      Mobility Bed Mobility               General bed mobility comments: pt in chair  Transfers Overall transfer level: Needs assistance Equipment used: Rolling walker (2 wheeled) Transfers: Sit to/from Stand Sit to Stand: +2 physical assistance;Mod assist         General transfer comment: assist to rise and for hand placement, very flexed posture in standing, unable to step with walker and assist, fearful of falling    Balance                                            ADL Overall ADL's : At baseline                                             Vision     Perception     Praxis      Pertinent Vitals/Pain Pain Assessment: Faces Faces Pain Scale: No hurt     Hand Dominance     Extremity/Trunk Assessment Upper Extremity Assessment Upper Extremity Assessment: Generalized weakness   Lower Extremity Assessment Lower Extremity Assessment: Defer to PT evaluation   Cervical / Trunk Assessment Cervical / Trunk Assessment:  (flexed posture in standing)   Communication Communication Communication: Prefers language other than English (Arabic, sister interpreted)   Cognition Arousal/Alertness: Awake/alert Behavior  During Therapy: Flat affect;Anxious Overall Cognitive Status: Difficult to assess                     General Comments       Exercises       Shoulder Instructions      Home Living Family/patient expects to be discharged to:: Private residence Living Arrangements: Other relatives (sister) Available Help at Discharge: Family Type of Home: Apartment Home Access: Stairs to enter Technical brewer of Steps: 3   Home Layout: One level     Bathroom Shower/Tub: Other (comment) (bathroom is inaccessible to w/c)   Bathroom Toilet: Standard     Home Equipment: Wheelchair - manual;Bedside commode;Hospital bed   Additional Comments: hospital bed has a broken rail, sister cannot afford to pay for service visit, CM notified      Prior Functioning/Environment Level of Independence: Needs assistance  Gait / Transfers Assistance Needed: requires 1-2 person assist short distances ADL's / Homemaking Assistance Needed: pt requires total care         OT Diagnosis: Generalized weakness;Blindness and low vision   OT Problem List:     OT Treatment/Interventions:      OT Goals(Current goals  can be found in the care plan section)    OT Frequency:     Barriers to D/C:            Co-evaluation PT/OT/SLP Co-Evaluation/Treatment: Yes Reason for Co-Treatment: For patient/therapist safety;Complexity of the patient's impairments (multi-system involvement)   OT goals addressed during session: Proper use of Adaptive equipment and DME      End of Session Equipment Utilized During Treatment: Gait belt;Rolling walker  Activity Tolerance: Patient tolerated treatment well Patient left: in chair;with call bell/phone within reach;with family/visitor present   Time: 1203-1220 OT Time Calculation (min): 17 min Charges:  OT General Charges $OT Visit: 1 Procedure OT Evaluation $OT Eval Moderate Complexity: 1 Procedure G-Codes:    Malka So 02/05/2016, 1:30  PM [phone]

## 2016-02-05 NOTE — Progress Notes (Signed)
Family Medicine Teaching Service Daily Progress Note Intern Pager: [phone]  Patient name: Debbie [name] Medical record number: [id number] Date of birth: [birth year] Age: 55 y.o. Gender: female  Primary Care Provider: Angelica Chessman, MD Consultants: Orthopedics, Infectious Disease Code Status: FULL  Pt Overview and Major Events to Date:  7/25 Placed in observation for L great toe cellulitis  Assessment and Plan: Debbie [name] is a 55 y.o. female presenting with L great toe pain and swelling. 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 Needs Arabic language interpreter.  L great toe cellulitis with osteomyelitis: Followed by podiatry outpatient failed treatment with topical mupirocin. MRI L foot suspicious for osteomyelitis of distal tip of L great toe Now WBC 11 but still afebrile. Patient and sister are now refusing both surgery and IV ABX, opted for home with po antibiotics today and voiced good understanding that this is not the optimal treatment. - DC vancomycin (7/25-/7/28), cefepime and flagyl (7/26-7/28) d/t pcn allergy with tolerating cephalosporins in the past per pharmacy  - Oxy IR 5mg  q4 PRN pain  - Orthopedic surgery, recommended surgery which patient and sister refused and opted for IV ABX - Per ID, can go home po levaquin and po bactrim since patient has declined IV antibiotics. Will need 2x/weekly BMP monitoring. Start po levaquin 750mg  daily, bactrim 1 tab DS bid (cannot do 2 DS tab d/t AKI) for 6 weeks.  - per VVF, referred to Dr. Sharol Given  AKI baseline Cr 0.8, now 1.3 likely 2/2 vancomycin - monitor Cr  Hives, resolved. Etiology unclear. No difficulty breathing or concern for anaphylaxis.  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 At home on Lantus 100U QHS, Novolog TID, Invokana, and reportedly both saxagliptin and sitagliptin - Sitagliptin not  on record at patient's pharmacy, holding - Continue Lantus 50U QHS, saxagliptin, Invokana, gabapentin - SSI - CBG ACHS  Peripheral arterial disease: Followed by Dr. Bridgett Larsson with vascular surgery. On ASA and Plavix at home, recently started in 12/2015. - Continue home Plavix and ASA  H/o HTN.  On lisinopril-HCTZ and propranolol at home. BP 140/49 on admission, BP now 103/42 - hold lisinopril-HCTZ - continue propranolol  HLD previous VVF notes states patient has been on lipitor - restarted atorvastatin 40mg  qd  Depression/anxiety: Reportedly on both Cymbalta and Zoloft at home.  - Continue Zoloft - holding Cymbalta as duplicate therapy with SSRI and SNRI  FEN/GI: heart health carb modified diet, Senna, glycerin suppository x1 Prophylaxis: lovenox  Disposition: home today  Subjective:  Patient is stating very clearly through the intrepreter that she would like to go home today and as soon as possible. Does not want surgery. Does not want IV antibiotics. Both patient and sister voiced good understanding that this is not the optimal treatment and that it will most likely not control the patient's infection. The patient would very much like to go home and has opted for po antibiotics.  Objective: Temp:  [98.1 F (36.7 C)-98.3 F (36.8 C)] 98.2 F (36.8 C) (07/28 0447) Pulse Rate:  [62-68] 67 (07/28 0447) Resp:  [17-19] 17 (07/28 0447) BP: (103-129)/(42-68) 103/42 (07/28 0447) SpO2:  [97 %-99 %] 99 % (07/28 0447) Physical Exam:  General: Sitting in bed in NAD, tearful Eyes: Blind bilaterally, wearing sunglasses Cardiovascular: RRR, no murmurs appreciated Respiratory: CTAB, normal WOB on RA Abdomen: Obese, soft, non-tender, non-distended, +BS Extremities: Moving all extremities spontaneously. Erythema and swelling  of L great toe. No fluctuance noted. Some tenderness with palpation. +DP pulse. R foot with midtarsal amputation.  Skin: no rashes noted Psych: flat affect, sparse  speech   Laboratory:  Recent Labs Lab 02/03/16 1047 02/04/16 0211 02/05/16 0301  WBC 9.4 10.4 11.0*  HGB 13.3 12.9 12.3  HCT 42.6 40.3 38.2  PLT 249 242 233    Recent Labs Lab 02/02/16 1153 02/03/16 0717 02/04/16 0211 02/05/16 0301  NA 140 141 142 142  K 4.3 4.8 3.8 4.7  CL 105 109 109 107  CO2 29 28 26 30   BUN 20 20 21* 25*  CREATININE 0.80 1.10* 1.02* 1.30*  CALCIUM 9.1 8.9 8.8* 8.7*  PROT 6.4*  --   --   --   BILITOT 0.6  --   --   --   ALKPHOS 120  --   --   --   ALT 40  --   --   --   AST 26  --   --   --   GLUCOSE 246* 111* 78 116*     Imaging/Diagnostic Tests: No results found.  Bufford Lope, DO 02/05/2016, 10:03 AM PGY-1, [facility] Intern pager: [phone], text pages welcome

## 2016-02-05 NOTE — Progress Notes (Signed)
Pt to be discharged home this pm. Taxi voucher provided by Education officer, museum

## 2016-02-05 NOTE — Telephone Encounter (Signed)
[facility] and [place]:  This Case Manager received call from Carolynn Sayers, RN CM, [facility], on 02/04/16 inquiring if patient qualified for the [facility] at [facility] and [facility]. Chart reviewed, and patient does not qualify for the [facility] because this is patient's first inpatient admission in the last year.  Hospital follow-up appointment scheduled for 02/11/16 at 0900 with Dr. Doreene Burke. AVS updated. Updated Magdalen Spatz, RN CM who indicated patient's sister should be updated.   Call placed to patient's sister using [employer] (Intepreter [phone]) to inform of hospital follow-up appointment with PCP. Patient's sister indicated she and patient refusing surgery and IV antibiotics. She wanted Dr. Doreene Burke (patient's PCP) to be aware. Spoke with Dr. Doreene Burke who indicated he agrees with the hospital care team that the patient needs IV antibiotics or amputation. Patient's sister updated, and she indicated patient is refusing IV antibiotics. Informed patient's sister that she and patient will have to work with inpatient care team regarding plan and further recommendations. Patient's sister verbalized understanding. Reminded her once again of hospital follow-up appointment on 02/11/16 at 0900 with Dr. Doreene Burke.

## 2016-02-05 NOTE — Care Management (Signed)
Patient has hospital bed at home she received from [facility] . She would like someone to check rail "it is stuck in the up position" . Tobin Chad with [facility] aware. Magdalen Spatz RN BSN [phone]

## 2016-02-05 NOTE — Progress Notes (Signed)
Md made aware of Pt's blood sugar of 66 tonight. After giving snacks it came up to 145.

## 2016-02-05 NOTE — Progress Notes (Signed)
Results for AASHI, GOHR (MRN [id number]) as of 02/05/2016 11:57  Ref. Range 02/04/2016 17:17 02/04/2016 21:44 02/04/2016 22:29 02/05/2016 07:38 02/05/2016 11:49  Glucose-Capillary Latest Ref Range: 65 - 99 mg/dL 105 (H) 66 145 (H) 73 115 (H)   Noted that blood sugars have been less than 100 mg/dl and one blood sugar less than 70 mg/dl.  Patient takes Lantus 20 units daily at home along with Novolog MODERATE correction scale TID.  Recommend decreasing Lantus to 20-25 units daily and decrease Novolog correction scale to MODERATE. Continue meal coverage of Novolog 4 units along with the oral DM medications.  Will continue to monitor blood sugars while in the hospital. Harvel Ricks RN BSN CDE

## 2016-02-08 ENCOUNTER — Telehealth: Payer: Self-pay | Admitting: Internal Medicine

## 2016-02-08 NOTE — Telephone Encounter (Signed)
Patient has appointment on 02/11/16.

## 2016-02-08 NOTE — Telephone Encounter (Signed)
Medical Assistant left message on [employer] cell voicemail. Voicemail states to give a call back to [place] with [facility] at [phone].

## 2016-02-08 NOTE — Telephone Encounter (Signed)
Debbie [name] from [facility] called stating that pts sister declined the visit to have labs done on pt  Pts sister stated that they can visit on Thursday to check labs

## 2016-02-11 ENCOUNTER — Encounter: Payer: Self-pay | Admitting: Internal Medicine

## 2016-02-11 ENCOUNTER — Ambulatory Visit: Payer: Self-pay | Admitting: Internal Medicine

## 2016-02-11 ENCOUNTER — Ambulatory Visit: Payer: Medicaid Other | Attending: Internal Medicine | Admitting: Internal Medicine

## 2016-02-11 VITALS — BP 133/77 | HR 61 | Temp 98.2°F | Resp 18

## 2016-02-11 DIAGNOSIS — Z794 Long term (current) use of insulin: Secondary | ICD-10-CM | POA: Diagnosis not present

## 2016-02-11 DIAGNOSIS — Z9109 Other allergy status, other than to drugs and biological substances: Secondary | ICD-10-CM | POA: Diagnosis not present

## 2016-02-11 DIAGNOSIS — E11319 Type 2 diabetes mellitus with unspecified diabetic retinopathy without macular edema: Secondary | ICD-10-CM | POA: Diagnosis not present

## 2016-02-11 DIAGNOSIS — L299 Pruritus, unspecified: Secondary | ICD-10-CM | POA: Diagnosis not present

## 2016-02-11 DIAGNOSIS — E039 Hypothyroidism, unspecified: Secondary | ICD-10-CM | POA: Diagnosis not present

## 2016-02-11 DIAGNOSIS — I10 Essential (primary) hypertension: Secondary | ICD-10-CM | POA: Insufficient documentation

## 2016-02-11 DIAGNOSIS — E669 Obesity, unspecified: Secondary | ICD-10-CM | POA: Diagnosis not present

## 2016-02-11 DIAGNOSIS — E1169 Type 2 diabetes mellitus with other specified complication: Secondary | ICD-10-CM

## 2016-02-11 DIAGNOSIS — Z880 Allergy status to penicillin: Secondary | ICD-10-CM | POA: Diagnosis not present

## 2016-02-11 DIAGNOSIS — E1165 Type 2 diabetes mellitus with hyperglycemia: Secondary | ICD-10-CM | POA: Insufficient documentation

## 2016-02-11 DIAGNOSIS — H269 Unspecified cataract: Secondary | ICD-10-CM | POA: Diagnosis not present

## 2016-02-11 DIAGNOSIS — Z7982 Long term (current) use of aspirin: Secondary | ICD-10-CM | POA: Insufficient documentation

## 2016-02-11 DIAGNOSIS — Z79899 Other long term (current) drug therapy: Secondary | ICD-10-CM | POA: Diagnosis not present

## 2016-02-11 DIAGNOSIS — Z791 Long term (current) use of non-steroidal anti-inflammatories (NSAID): Secondary | ICD-10-CM | POA: Insufficient documentation

## 2016-02-11 DIAGNOSIS — L03032 Cellulitis of left toe: Secondary | ICD-10-CM | POA: Insufficient documentation

## 2016-02-11 DIAGNOSIS — R079 Chest pain, unspecified: Secondary | ICD-10-CM | POA: Insufficient documentation

## 2016-02-11 DIAGNOSIS — E11628 Type 2 diabetes mellitus with other skin complications: Secondary | ICD-10-CM

## 2016-02-11 DIAGNOSIS — L089 Local infection of the skin and subcutaneous tissue, unspecified: Secondary | ICD-10-CM

## 2016-02-11 LAB — POCT GLYCOSYLATED HEMOGLOBIN (HGB A1C): HEMOGLOBIN A1C: 6.4

## 2016-02-11 LAB — GLUCOSE, POCT (MANUAL RESULT ENTRY): POC Glucose: 64 mg/dl — AB (ref 70–99)

## 2016-02-11 MED ORDER — ATORVASTATIN CALCIUM 40 MG PO TABS: 40.0000 mg | ORAL_TABLET | Freq: Every day | ORAL | 3 refills | Status: DC

## 2016-02-11 MED ORDER — TRIAMCINOLONE ACETONIDE 0.1 % EX CREA: 1.0000 "application " | TOPICAL_CREAM | Freq: Two times a day (BID) | CUTANEOUS | 3 refills | Status: DC

## 2016-02-11 MED ORDER — DIPHENHYDRAMINE HCL 25 MG PO TABS: 25.0000 mg | ORAL_TABLET | Freq: Four times a day (QID) | ORAL | 3 refills | Status: DC | PRN

## 2016-02-11 MED FILL — CIPRODEX OTIC SUSPENSION: 0.3-0.1 | 20 days supply | Qty: 8 | Fill #0

## 2016-02-11 NOTE — Patient Instructions (Signed)
Diabetes and Exercise Exercising regularly is important. It is not just about losing weight. It has many health benefits, such as:  Improving your overall fitness, flexibility, and endurance.  Increasing your bone density.  Helping with weight control.  Decreasing your body fat.  Increasing your muscle strength.  Reducing stress and tension.  Improving your overall health. People with diabetes who exercise gain additional benefits because exercise:  Reduces appetite.  Improves the body's use of blood sugar (glucose).  Helps lower or control blood glucose.  Decreases blood pressure.  Helps control blood lipids (such as cholesterol and triglycerides).  Improves the body's use of the hormone insulin by:  Increasing the body's insulin sensitivity.  Reducing the body's insulin needs.  Decreases the risk for heart disease because exercising:  Lowers cholesterol and triglycerides levels.  Increases the levels of good cholesterol (such as high-density lipoproteins [HDL]) in the body.  Lowers blood glucose levels. YOUR ACTIVITY PLAN  Choose an activity that you enjoy, and set realistic goals. To exercise safely, you should begin practicing any new physical activity slowly, and gradually increase the intensity of the exercise over time. Your health care provider or diabetes educator can help create an activity plan that works for you. General recommendations include:  Encouraging children to engage in at least 60 minutes of physical activity each day.  Stretching and performing strength training exercises, such as yoga or weight lifting, at least 2 times per week.  Performing a total of at least 150 minutes of moderate-intensity exercise each week, such as brisk walking or water aerobics.  Exercising at least 3 days per week, making sure you allow no more than 2 consecutive days to pass without exercising.  Avoiding long periods of inactivity (90 minutes or more). When you  have to spend an extended period of time sitting down, take frequent breaks to walk or stretch. RECOMMENDATIONS FOR EXERCISING WITH TYPE 1 OR TYPE 2 DIABETES   Check your blood glucose before exercising. If blood glucose levels are greater than 240 mg/dL, check for urine ketones. Do not exercise if ketones are present.  Avoid injecting insulin into areas of the body that are going to be exercised. For example, avoid injecting insulin into:  The arms when playing tennis.  The legs when jogging.  Keep a record of:  Food intake before and after you exercise.  Expected peak times of insulin action.  Blood glucose levels before and after you exercise.  The type and amount of exercise you have done.  Review your records with your health care provider. Your health care provider will help you to develop guidelines for adjusting food intake and insulin amounts before and after exercising.  If you take insulin or oral hypoglycemic agents, watch for signs and symptoms of hypoglycemia. They include:  Dizziness.  Shaking.  Sweating.  Chills.  Confusion.  Drink plenty of water while you exercise to prevent dehydration or heat stroke. Body water is lost during exercise and must be replaced.  Talk to your health care provider before starting an exercise program to make sure it is safe for you. Remember, almost any type of activity is better than none.   This information is not intended to replace advice given to you by your health care provider. Make sure you discuss any questions you have with your health care provider.   Document Released: 09/17/2003 Document Revised: 11/11/2014 Document Reviewed: 12/04/2012 Elsevier Interactive Patient Education 2016 Elsevier Inc. Basic Carbohydrate Counting for Diabetes Mellitus Carbohydrate counting   is a method for keeping track of the amount of carbohydrates you eat. Eating carbohydrates naturally increases the level of sugar (glucose) in your  blood, so it is important for you to know the amount that is okay for you to have in every meal. Carbohydrate counting helps keep the level of glucose in your blood within normal limits. The amount of carbohydrates allowed is different for every person. A dietitian can help you calculate the amount that is right for you. Once you know the amount of carbohydrates you can have, you can count the carbohydrates in the foods you want to eat. Carbohydrates are found in the following foods:  Grains, such as breads and cereals.  Dried beans and soy products.  Starchy vegetables, such as potatoes, peas, and corn.  Fruit and fruit juices.  Milk and yogurt.  Sweets and snack foods, such as cake, cookies, candy, chips, soft drinks, and fruit drinks. CARBOHYDRATE COUNTING There are two ways to count the carbohydrates in your food. You can use either of the methods or a combination of both. Reading the "Nutrition Facts" on Packaged Food The "Nutrition Facts" is an area that is included on the labels of almost all packaged food and beverages in the [place]. It includes the serving size of that food or beverage and information about the nutrients in each serving of the food, including the grams (g) of carbohydrate per serving.  Decide the number of servings of this food or beverage that you will be able to eat or drink. Multiply that number of servings by the number of grams of carbohydrate that is listed on the label for that serving. The total will be the amount of carbohydrates you will be having when you eat or drink this food or beverage. Learning Standard Serving Sizes of Food When you eat food that is not packaged or does not include "Nutrition Facts" on the label, you need to measure the servings in order to count the amount of carbohydrates.A serving of most carbohydrate-rich foods contains about 15 g of carbohydrates. The following list includes serving sizes of carbohydrate-rich foods that  provide 15 g ofcarbohydrate per serving:   1 slice of bread (1 oz) or 1 six-inch tortilla.    of a hamburger bun or English muffin.  4-6 crackers.   cup unsweetened dry cereal.    cup hot cereal.   cup rice or pasta.    cup mashed potatoes or  of a large baked potato.  1 cup fresh fruit or one small piece of fruit.    cup canned or frozen fruit or fruit juice.  1 cup milk.   cup plain fat-free yogurt or yogurt sweetened with artificial sweeteners.   cup cooked dried beans or starchy vegetable, such as peas, corn, or potatoes.  Decide the number of standard-size servings that you will eat. Multiply that number of servings by 15 (the grams of carbohydrates in that serving). For example, if you eat 2 cups of strawberries, you will have eaten 2 servings and 30 g of carbohydrates (2 servings x 15 g = 30 g). For foods such as soups and casseroles, in which more than one food is mixed in, you will need to count the carbohydrates in each food that is included. EXAMPLE OF CARBOHYDRATE COUNTING Sample Dinner  3 oz chicken breast.   cup of brown rice.   cup of corn.  1 cup milk.   1 cup strawberries with sugar-free whipped topping.  Carbohydrate Calculation Step   1: Identify the foods that contain carbohydrates:   Rice.   Corn.   Milk.   Strawberries. Step 2:Calculate the number of servings eaten of each:   2 servings of rice.   1 serving of corn.   1 serving of milk.   1 serving of strawberries. Step 3: Multiply each of those number of servings by 15 g:   2 servings of rice x 15 g = 30 g.   1 serving of corn x 15 g = 15 g.   1 serving of milk x 15 g = 15 g.   1 serving of strawberries x 15 g = 15 g. Step 4: Add together all of the amounts to find the total grams of carbohydrates eaten: 30 g + 15 g + 15 g + 15 g = 75 g.   This information is not intended to replace advice given to you by your health care provider. Make sure you  discuss any questions you have with your health care provider.   Document Released: 06/27/2005 Document Revised: 07/18/2014 Document Reviewed: 05/24/2013 Elsevier Interactive Patient Education 2016 Elsevier Inc.  

## 2016-02-11 NOTE — Progress Notes (Signed)
Debbie [name], is a 55 y.o. female  [id number]  [id number]  DOB - 06/15/1961  Subjective:   Debbie [name] is a 55 y.o. female here today for a hospital discharge follow up visit. Patient was admitted to the hospital on 02/02/2016 with left great toe cellulitis that has failed outpatient treatment. Her physical examination revealed erythema, swelling and tenderness, she was treated with IV antibiotics and advised amputation but patient declined. She is here today for routine follow-up, still on oral antibiotics. 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 She has no new complaints today. Patient had vascular surgeon evaluation as well as orthopedic surgery consult during admission, both recommended amputation because of severe arterial disease and osteomyelitis of the left great toe evidenced on CT angiogram and MRI left foot, but patient declined. Patient also declined PICC line insertion for IV antibiotics at home, the reason she is on by mouth antibiotics, Levaquin and Bactrim DS. Patient has an appointment with the vascular surgeon close to her residence at [place], [place]. Patient denies headache, No fever, no chest pain, No abdominal pain - No Nausea, No new weakness tingling or numbness, No Cough - SOB.  Problem  Failure of Outpatient Treatment (Resolved)  Cellulitis (Resolved)  Atherosclerotic Peripheral Vascular Disease (Hcc) (Resolved)  Diarrhea (Resolved)  Type 2 diabetes mellitus with hyperglycemia (HCC) (Resolved)  DM (Diabetes Mellitus) Type I Controlled, Peripheral Vascular Disorder (Hcc) (Resolved)  DM (Diabetes Mellitus) Type 2, Uncontrolled, With Ketoacidosis (Hcc) (Resolved)  Essential Hypertension (Resolved)  Hyperglycemia (Resolved)  DKA (diabetic ketoacidoses) (HCC) (Resolved)  Hyperkalemia (Resolved)  Altered Mental Status  (Resolved)   ALLERGIES: Allergies  Allergen Reactions  . Penicillins Rash    Has patient had a PCN reaction causing immediate rash, facial/tongue/throat swelling, SOB or lightheadedness with hypotension: Yes Has patient had a PCN reaction causing severe rash involving mucus membranes or skin necrosis: No Has patient had a PCN reaction that required hospitalization No Has patient had a PCN reaction occurring within the last 10 years: No If all of the above answers are "NO", then may proceed with Cephalosporin use.   Marland Kitchen Hydrocortisone Rash    Redness   PAST MEDICAL HISTORY: Past Medical History:  Diagnosis Date  . Blind   . Cataract   . Chest pain    a. ? MI in 2010, [place]->medically managed  . Diabetic foot ulcer ([place])   . Hypertension    x ~ 10 years  . Hypothyroidism   . Obesity   . Type II or unspecified type diabetes mellitus with unspecified complication, uncontrolled    x ~ 10 years   MEDICATIONS AT HOME: Prior to Admission medications   Medication Sig Start Date End Date Taking? Authorizing Provider  ACCU-CHEK SOFTCLIX LANCETS lancets  11/17/15   Historical Provider, MD  acetaminophen-codeine (TYLENOL #3) 300-30 MG tablet Take 1 tablet by mouth every 4 (four) hours as needed. Patient taking differently: Take 1 tablet by mouth every 4 (four) hours as needed for moderate pain.  12/24/15   Tresa Garter, MD  aspirin EC 81 MG EC tablet Take 1 tablet (81 mg total) by mouth daily. 02/05/16   Elsia Nigel Sloop, DO  atorvastatin (LIPITOR) 40 MG tablet Take 1 tablet (40 mg total) by mouth daily at 8 pm. 02/11/16   Tresa Garter, MD  canagliflozin (INVOKANA) 100 MG TABS tablet TAKE ONE (1) TABLET BY MOUTH EVERY DAY 02/05/15  Tresa Garter, MD  clopidogrel (PLAVIX) 75 MG tablet Take 1 tablet (75 mg total) by mouth daily. 01/22/16   Conrad [place], MD  clotrimazole (LOTRIMIN) 1 % cream APPLY TO AFFECTED AREAS TWICE DAILY Patient taking differently: Apply 1 application topically  2 (two) times daily.  11/10/15   Tresa Garter, MD  diazepam (VALIUM) 2 MG tablet Take 1 tablet (2 mg total) by mouth every 8 (eight) hours as needed for anxiety. 11/05/15   Tresa Garter, MD  diphenhydrAMINE (BENADRYL) 25 MG tablet Take 1 tablet (25 mg total) by mouth every 6 (six) hours as needed. 02/11/16   Tresa Garter, MD  DULoxetine (CYMBALTA) 30 MG capsule Take 1 capsule (30 mg total) by mouth daily. 01/22/16   Conrad [place], MD  gabapentin (NEURONTIN) 300 MG capsule Take 1 capsule (300 mg total) by mouth 3 (three) times daily. 12/24/15   Tresa Garter, MD  glucose blood (ACCU-CHEK AVIVA PLUS) test strip Check blood sugar 3 times daily. 01/28/16   Tresa Garter, MD  insulin aspart (NOVOLOG) 100 UNIT/ML injection SEE NOTES Dispense QH 1 month Patient taking differently: Inject 0-15 Units into the skin See admin instructions. Three times daily per Sliding scale: 121-150, 2 units : 151-200, 3 units : 201-250, 5 units : 251-300 8 units : 301-350, 11 units : 351-400, 15 units 07/23/15   Willeen Niece, MD  insulin glargine (LANTUS) 100 UNIT/ML injection Inject 0.2 mLs (20 Units total) into the skin at bedtime. 12/31/15   Tresa Garter, MD  Insulin Syringe-Needle U-100 (INSULIN SYRINGE 1CC/30GX5/16") 30G X 5/16" 1 ML MISC USE AS DIRECTED WITH LANTUS AND NOVOLOG INJECTIONS 10/08/15   Tresa Garter, MD  Lancets (ACCU-CHEK SOFT TOUCH) lancets Use as instructed 12/31/15   Tresa Garter, MD  levofloxacin (LEVAQUIN) 750 MG tablet Take 1 tablet (750 mg total) by mouth daily. 02/05/16   Elsia Nigel Sloop, DO  loteprednol (LOTEMAX) 0.5 % ophthalmic suspension Place 1 drop into both eyes 2 (two) times daily. 01/09/14   Tresa Garter, MD  propranolol ER (INDERAL LA) 60 MG 24 hr capsule Take 1 capsule (60 mg total) by mouth daily. 07/23/15   Willeen Niece, MD  saxagliptin HCl (ONGLYZA) 2.5 MG TABS tablet Take 2.5 mg by mouth 2 (two) times daily.     Historical Provider, MD  senna  (SENOKOT) 8.6 MG TABS tablet Take 2 tablets (17.2 mg total) by mouth at bedtime as needed (for constipation). 07/23/15   Willeen Niece, MD  sertraline (ZOLOFT) 50 MG tablet Take 1 tablet by mouth daily. 11/17/15   Historical Provider, MD  sulfamethoxazole-trimethoprim (BACTRIM DS,SEPTRA DS) 800-160 MG tablet Take 1 tablet by mouth every 12 (twelve) hours. 02/05/16   Bufford Lope, DO  triamcinolone cream (KENALOG) 0.1 % Apply 1 application topically 2 (two) times daily. 02/11/16   Tresa Garter, MD     Objective:   Exam General appearance : Awake, alert, not in any distress. Speech Clear. Not toxic looking HEENT: Atraumatic and Normocephalic, pupils equally reactive to light and accomodation Neck: Supple, no JVD. No cervical lymphadenopathy.  Chest: Good air entry bilaterally, no added sounds  CVS: S1 S2 regular, no murmurs.  Abdomen: Bowel sounds present, Non tender and not distended with no gaurding, rigidity or rebound. Extremities: Erythema and mild swelling of L great toe. No fluctuance, Some tenderness with palpation. +DP pulse. R foot s/p midtarsal amputation. B/L Lower Ext shows no edema,  both legs are warm to touch Erythematous macules and scratch marks presents primarily on upper extremities consistent with hives.  Neurology: Awake alert, and oriented X 3, CN II-XII intact, Non focal  Data Review Lab Results  Component Value Date   HGBA1C 6.4 02/11/2016   HGBA1C 6.3 12/24/2015   HGBA1C 13.50 02/05/2015     Assessment & Plan   1. Type 2 diabetes mellitus with hyperglycemia, unspecified long term insulin use status (HCC)  - POCT glucose (manual entry) - POCT glycosylated hemoglobin (Hb A1C) - atorvastatin (LIPITOR) 40 MG tablet; Take 1 tablet (40 mg total) by mouth daily at 8 pm.  Dispense: 90 tablet; Refill: 3   2. Essential hypertension  We have discussed target BP range and blood pressure goal. I have advised patient to check BP regularly and to call us back or report  to clinic if the numbers are consistently higher than 140/90. We discussed the importance of compliance with medical therapy and DASH diet recommended, consequences of uncontrolled hypertension discussed.  - continue current BP medications  3. Diabetic foot infection ([place])  Follow up with Vascular Surgery and Podiatry as scheduled Continue Antibiotics Continue Wound dressing Return if symptoms of fever or any sign of infection develops  4. Itching with irritation  - triamcinolone cream (KENALOG) 0.1 %; Apply 1 application topically 2 (two) times daily.  Dispense: 80 g; Refill: 3 - diphenhydrAMINE (BENADRYL) 25 MG tablet; Take 1 tablet (25 mg total) by mouth every 6 (six) hours as needed.  Dispense: 60 tablet; Refill: 3  Patient have been counseled extensively about nutrition and exercise  Return in about 4 weeks (around 03/10/2016) for Follow up HTN,  Follow up Pain and comorbidities, Follow up Pain and comorbidities.  Interpreter was used to communicate directly with patient for the entire encounter including providing detailed patient instructions.   The patient was given clear instructions to go to ER or return to medical center if symptoms don't improve, worsen or new problems develop. The patient verbalized understanding. The patient was told to call to get lab results if they haven't heard anything in the next week.   This note has been created with Surveyor, quantity. Any transcriptional errors are unintentional.    Angelica Chessman, MD, [place], [place], [place], [place] and [place], [place]   02/11/2016, 3:47 PM

## 2016-02-23 ENCOUNTER — Telehealth: Payer: Self-pay | Admitting: Internal Medicine

## 2016-02-23 NOTE — Telephone Encounter (Signed)
Donita from [facility] called to let pt. PCP know that pt. Had BMP labs done and her potassium was 5.8. Rep. Will repeat labs today and will fax the results. Pt. Is also having vaginal bleeding and pt. Sister called last week and was told to take her to the ED. Pt. Refused to go to the ED and is still having vaginal bleeding. Please f/u.

## 2016-02-23 NOTE — Telephone Encounter (Signed)
Routed to PCP for advise

## 2016-02-23 NOTE — Telephone Encounter (Signed)
Debbie [name] from [facility] called to let pt. PCP know that pt. Has a wound on her left great toe and it is showing black necrotic tissue. Pt. Refuses to go to the ED. Debbie [name] would like a hospice referral for the pt. Please f/u.

## 2016-02-24 ENCOUNTER — Telehealth: Payer: Self-pay | Admitting: Vascular Surgery

## 2016-02-24 NOTE — Telephone Encounter (Signed)
Resched appt 9/29 at 9:30. Spoke to pt to inform her of new appt.

## 2016-02-24 NOTE — Telephone Encounter (Signed)
-----   Message from Mena Goes, RN sent at 02/24/2016 11:02 AM EDT ----- Regarding: Move pt on 03-04-16 per [place]   ----- Message ----- From: Conrad [place], MD Sent: 02/24/2016  10:18 AM To: Vvs Charge Riese Lounsberry [id number] 04-12-1961  Pt is on my schedule for 25 AUG 17.  Reschedule her another 2-4 weeks later.  She has follow up with Dr. Sharol Given in 2 weeks, so I don't need to see her before then, as I have nothing more to add to her care.  This patient took nearly 1.5 hours to see last time: 45 minute with RN, another 45 minutes with MD, so I would plan on booking her minimally into a 30 minute slot.

## 2016-02-25 ENCOUNTER — Inpatient Hospital Stay (HOSPITAL_COMMUNITY)
Admission: EM | Admit: 2016-02-25 | Discharge: 2016-02-28 | DRG: 760 | Disposition: A | Discharge status: Home / Self Care | Payer: Medicaid Other | Attending: Family Medicine | Admitting: Family Medicine

## 2016-02-25 ENCOUNTER — Encounter (HOSPITAL_COMMUNITY): Payer: Self-pay | Admitting: Emergency Medicine

## 2016-02-25 ENCOUNTER — Inpatient Hospital Stay (HOSPITAL_COMMUNITY): Payer: Medicaid Other

## 2016-02-25 ENCOUNTER — Telehealth: Payer: Self-pay | Admitting: Internal Medicine

## 2016-02-25 ENCOUNTER — Emergency Department (HOSPITAL_COMMUNITY): Payer: Medicaid Other

## 2016-02-25 ENCOUNTER — Other Ambulatory Visit: Payer: Self-pay | Admitting: Pharmacist

## 2016-02-25 DIAGNOSIS — N939 Abnormal uterine and vaginal bleeding, unspecified: Secondary | ICD-10-CM | POA: Diagnosis present

## 2016-02-25 DIAGNOSIS — I959 Hypotension, unspecified: Secondary | ICD-10-CM | POA: Diagnosis not present

## 2016-02-25 DIAGNOSIS — L03032 Cellulitis of left toe: Secondary | ICD-10-CM

## 2016-02-25 DIAGNOSIS — I10 Essential (primary) hypertension: Secondary | ICD-10-CM | POA: Diagnosis present

## 2016-02-25 DIAGNOSIS — Z789 Other specified health status: Secondary | ICD-10-CM

## 2016-02-25 DIAGNOSIS — D62 Acute posthemorrhagic anemia: Secondary | ICD-10-CM

## 2016-02-25 DIAGNOSIS — Z7982 Long term (current) use of aspirin: Secondary | ICD-10-CM

## 2016-02-25 DIAGNOSIS — F329 Major depressive disorder, single episode, unspecified: Secondary | ICD-10-CM | POA: Diagnosis present

## 2016-02-25 DIAGNOSIS — Z794 Long term (current) use of insulin: Secondary | ICD-10-CM | POA: Diagnosis not present

## 2016-02-25 DIAGNOSIS — H269 Unspecified cataract: Secondary | ICD-10-CM | POA: Diagnosis present

## 2016-02-25 DIAGNOSIS — E1151 Type 2 diabetes mellitus with diabetic peripheral angiopathy without gangrene: Secondary | ICD-10-CM | POA: Diagnosis present

## 2016-02-25 DIAGNOSIS — E039 Hypothyroidism, unspecified: Secondary | ICD-10-CM | POA: Diagnosis present

## 2016-02-25 DIAGNOSIS — F411 Generalized anxiety disorder: Secondary | ICD-10-CM | POA: Diagnosis present

## 2016-02-25 DIAGNOSIS — E1165 Type 2 diabetes mellitus with hyperglycemia: Secondary | ICD-10-CM

## 2016-02-25 DIAGNOSIS — N179 Acute kidney failure, unspecified: Secondary | ICD-10-CM | POA: Diagnosis not present

## 2016-02-25 DIAGNOSIS — F4310 Post-traumatic stress disorder, unspecified: Secondary | ICD-10-CM | POA: Diagnosis present

## 2016-02-25 DIAGNOSIS — E1142 Type 2 diabetes mellitus with diabetic polyneuropathy: Secondary | ICD-10-CM | POA: Diagnosis present

## 2016-02-25 DIAGNOSIS — Z7902 Long term (current) use of antithrombotics/antiplatelets: Secondary | ICD-10-CM

## 2016-02-25 DIAGNOSIS — E11628 Type 2 diabetes mellitus with other skin complications: Secondary | ICD-10-CM | POA: Diagnosis present

## 2016-02-25 DIAGNOSIS — E11319 Type 2 diabetes mellitus with unspecified diabetic retinopathy without macular edema: Secondary | ICD-10-CM | POA: Diagnosis present

## 2016-02-25 DIAGNOSIS — R55 Syncope and collapse: Secondary | ICD-10-CM | POA: Diagnosis present

## 2016-02-25 LAB — WET PREP, GENITAL
Clue Cells Wet Prep HPF POC: NONE SEEN
Sperm: NONE SEEN
TRICH WET PREP: NONE SEEN
WBC, Wet Prep HPF POC: NONE SEEN
YEAST WET PREP: NONE SEEN

## 2016-02-25 LAB — CBC WITH DIFFERENTIAL/PLATELET
Basophils Absolute: 0 10*3/uL (ref 0.0–0.1)
Basophils Relative: 0 %
Eosinophils Absolute: 0.1 10*3/uL (ref 0.0–0.7)
Eosinophils Relative: 1 %
HCT: 22.8 % — ABNORMAL LOW (ref 36.0–46.0)
HEMOGLOBIN: 7.1 g/dL — AB (ref 12.0–15.0)
LYMPHS ABS: 3.1 10*3/uL (ref 0.7–4.0)
Lymphocytes Relative: 30 %
MCH: 27 pg (ref 26.0–34.0)
MCHC: 31.1 g/dL (ref 30.0–36.0)
MCV: 86.7 fL (ref 78.0–100.0)
MONOS PCT: 3 %
Monocytes Absolute: 0.3 10*3/uL (ref 0.1–1.0)
NEUTROS ABS: 6.8 10*3/uL (ref 1.7–7.7)
Neutrophils Relative %: 66 %
Platelets: 247 10*3/uL (ref 150–400)
RBC: 2.63 MIL/uL — AB (ref 3.87–5.11)
RDW: 15 % (ref 11.5–15.5)
WBC: 10.4 10*3/uL (ref 4.0–10.5)

## 2016-02-25 LAB — I-STAT CHEM 8, ED
BUN: 22 mg/dL — AB (ref 6–20)
CHLORIDE: 104 mmol/L (ref 101–111)
Calcium, Ion: 1.15 mmol/L (ref 1.13–1.30)
Creatinine, Ser: 1.2 mg/dL — ABNORMAL HIGH (ref 0.44–1.00)
Glucose, Bld: 179 mg/dL — ABNORMAL HIGH (ref 65–99)
HEMATOCRIT: 19 % — AB (ref 36.0–46.0)
Hemoglobin: 6.5 g/dL — CL (ref 12.0–15.0)
POTASSIUM: 4.6 mmol/L (ref 3.5–5.1)
SODIUM: 138 mmol/L (ref 135–145)
TCO2: 28 mmol/L (ref 0–100)

## 2016-02-25 LAB — BASIC METABOLIC PANEL
Anion gap: 4 — ABNORMAL LOW (ref 5–15)
BUN: 20 mg/dL (ref 6–20)
CHLORIDE: 107 mmol/L (ref 101–111)
CO2: 25 mmol/L (ref 22–32)
Calcium: 8.4 mg/dL — ABNORMAL LOW (ref 8.9–10.3)
Creatinine, Ser: 1.18 mg/dL — ABNORMAL HIGH (ref 0.44–1.00)
GFR calc Af Amer: 59 mL/min — ABNORMAL LOW (ref >60)
GFR calc non Af Amer: 51 mL/min — ABNORMAL LOW (ref >60)
GLUCOSE: 181 mg/dL — AB (ref 65–99)
POTASSIUM: 4.5 mmol/L (ref 3.5–5.1)
SODIUM: 136 mmol/L (ref 135–145)

## 2016-02-25 LAB — ABO/RH: ABO/RH(D): O POS

## 2016-02-25 LAB — I-STAT BETA HCG BLOOD, ED (MC, WL, AP ONLY)

## 2016-02-25 LAB — PREPARE RBC (CROSSMATCH)

## 2016-02-25 MED ORDER — SODIUM CHLORIDE 0.9 % IV SOLN
Freq: Once | INTRAVENOUS | Status: AC
  Administered 2016-02-25: 22:00:00 via INTRAVENOUS

## 2016-02-25 MED ORDER — MEGESTROL ACETATE 40 MG PO TABS
40.0000 mg | ORAL_TABLET | ORAL | Status: AC
  Administered 2016-02-25: 40 mg via ORAL
  Filled 2016-02-25: qty 1

## 2016-02-25 MED ORDER — CANAGLIFLOZIN 100 MG PO TABS: ORAL_TABLET | ORAL | 3 refills | Status: DC

## 2016-02-25 NOTE — ED Notes (Signed)
Attempted Report x2. 

## 2016-02-25 NOTE — ED Notes (Signed)
Patient transported to Ultrasound 

## 2016-02-25 NOTE — Telephone Encounter (Signed)
Donita from [facility] called the office to speak with nurse regarding patients lab. Pt is suppose to get lab work done twice a week. Labs were drawn on Tuesday 8/15 put they weren't picked up. Donita drew stat labs today on patient and will have results soon. Please follow up.  Thank you.

## 2016-02-25 NOTE — ED Notes (Signed)
Phlebotomy at the bedside  

## 2016-02-25 NOTE — ED Notes (Signed)
Attempted report 

## 2016-02-25 NOTE — ED Notes (Signed)
Patient transported to CT 

## 2016-02-25 NOTE — ED Triage Notes (Signed)
PER [facility]: Patient to ED from home following witnessed fall - per EMS, patient's sister states patient has been dizzy all day, had one episode emesis and then fell, hitting her head on edge of coffee table. Patient is on Plavix. Small lac to bridge of nose and under R eye, bleeding controlled. Patient moaning/repeating prayer over and over again, speaks Arabic, no [place]. Patient insists on having big black sunglasses on, clenches eyes shut when attempting to assess pupils. Upon EMS arrival, pt cool, clammy skin, BP 80/40 - increased to 106/54 in Trendelenburg. EMS VS: EKG unremarkable, HR 84, 100% RA, CBG 174. Also, per sister, patient has "been on her period for 3 weeks."

## 2016-02-25 NOTE — ED Provider Notes (Signed)
[facility] DEPT Provider Note   CSN: [id number] Arrival date & time: 02/25/16  1814     History   Chief Complaint Chief Complaint  Patient presents with  . Fall    HPI Debbie [name] is a 55 y.o. female.  55 yo F with a chief complaint of a syncopal event. Patient has been having vaginal bleeding since she was discharged from the hospital about 2 weeks ago. Passing large clots. She has an irregular menstrual cycle. She denies fevers or chills. Is on Plavix but is not sure why. Upon standing she passed out earlier today and struck her head. Complaining mostly of right-sided head pain. Has been feeling lightheaded and dizzy since she's been passing the clots. This been getting worse.   The history is provided by the patient.  Fall  This is a new problem. The current episode started 6 to 12 hours ago. The problem occurs rarely. The problem has not changed since onset.Pertinent negatives include no chest pain, no headaches and no shortness of breath. Nothing aggravates the symptoms. Nothing relieves the symptoms. She has tried nothing for the symptoms. The treatment provided no relief.    Past Medical History:  Diagnosis Date  . Blind   . Cataract   . Chest pain    a. ? MI in 2010, [place]->medically managed  . Diabetic foot ulcer ([place])   . Hypertension    x ~ 10 years  . Hypothyroidism   . Obesity   . Type II or unspecified type diabetes mellitus with unspecified complication, uncontrolled    x ~ 10 years    Patient Active Problem List   Diagnosis Date Noted  . Vaginal bleeding 02/25/2016  . Pain and swelling of toe of left foot   . Cellulitis of toe of left foot   . Atherosclerotic peripheral vascular disease with rest pain ([place]) 01/22/2016  . Poorly controlled type 2 diabetes mellitus ([place]) 11/05/2015  . PAD (peripheral artery disease) ([place]) 11/05/2015  . Hypoglycemia 04/13/2015  . Chronic pain syndrome 02/05/2015  . Fall at home 02/05/2015  . PTSD (post-traumatic  stress disorder) 01/09/2014  . Fungal dermatitis 01/09/2014  . Furuncle 08/02/2013  . Uncontrolled diabetes mellitus ([place]) 08/02/2013  . Generalized anxiety disorder 12/13/2012  . Polyneuropathy in diabetes(357.2) 12/13/2012  . Diabetic retinopathy ([place]) 12/12/2012  . Diabetic foot ulcers ([place]) 12/05/2012  . Diabetic foot infection ([place]) 11/30/2012  . Unspecified hypothyroidism 11/30/2012  . Adjustment disorder with mixed anxiety and depressed mood 11/10/2012  . Diabetes mellitus type 2 with peripheral artery disease ([place]) 11/09/2012  . HTN (hypertension) 11/09/2012    Past Surgical History:  Procedure Laterality Date  . BACK SURGERY    . PERIPHERAL VASCULAR CATHETERIZATION N/A 02/01/2016   Procedure: Abdominal Aortogram w/Lower Extremity;  Surgeon: Conrad [place], MD;  Location: [facility];  Service: Cardiovascular;  Laterality: N/A;  . TUBAL LIGATION      OB History    No data available       Home Medications    Prior to Admission medications   Medication Sig Start Date End Date Taking? Authorizing Provider  ACCU-CHEK SOFTCLIX LANCETS lancets  11/17/15  Yes Historical Provider, MD  aspirin EC 81 MG EC tablet Take 1 tablet (81 mg total) by mouth daily. 02/05/16  Yes Elsia Nigel Sloop, DO  atorvastatin (LIPITOR) 40 MG tablet Take 1 tablet (40 mg total) by mouth daily at 8 pm. Patient taking differently: Take 40 mg by mouth every morning.  02/11/16  Yes Tresa Garter, MD  canagliflozin (INVOKANA) 100 MG TABS tablet TAKE ONE (1) TABLET BY MOUTH EVERY DAY 02/25/16  Yes Tresa Garter, MD  clopidogrel (PLAVIX) 75 MG tablet Take 1 tablet (75 mg total) by mouth daily. 01/22/16  Yes Conrad [place], MD  clotrimazole (LOTRIMIN) 1 % cream APPLY TO AFFECTED AREAS TWICE DAILY Patient taking differently: Apply 1 application topically 2 (two) times daily.  11/10/15  Yes Tresa Garter, MD  DULoxetine (CYMBALTA) 30 MG capsule Take 1 capsule (30 mg total) by mouth daily. 01/22/16  Yes  Conrad Crest, MD  gabapentin (NEURONTIN) 300 MG capsule Take 1 capsule (300 mg total) by mouth 3 (three) times daily. Patient taking differently: Take 300 mg by mouth 2 (two) times daily.  12/24/15  Yes Olugbemiga E Doreene Burke, MD  glucose blood (ACCU-CHEK AVIVA PLUS) test strip Check blood sugar 3 times daily. 01/28/16  Yes Tresa Garter, MD  insulin aspart (NOVOLOG) 100 UNIT/ML injection SEE NOTES Dispense QH 1 month Patient taking differently: Inject 10 Units into the skin 3 (three) times daily with meals. Three times daily per Sliding scale: 121-150, 2 units : 151-200, 3 units : 201-250, 5 units : 251-300 8 units : 301-350, 11 units : 351-400, 15 units 07/23/15  Yes Willeen Niece, MD  insulin glargine (LANTUS) 100 UNIT/ML injection Inject 0.2 mLs (20 Units total) into the skin at bedtime. 12/31/15  Yes Tresa Garter, MD  Insulin Syringe-Needle U-100 (INSULIN SYRINGE 1CC/30GX5/16") 30G X 5/16" 1 ML MISC USE AS DIRECTED WITH LANTUS AND NOVOLOG INJECTIONS 10/08/15  Yes Tresa Garter, MD  Lancets (ACCU-CHEK SOFT TOUCH) lancets Use as instructed 12/31/15  Yes Tresa Garter, MD  levofloxacin (LEVAQUIN) 750 MG tablet Take 1 tablet (750 mg total) by mouth daily. 02/05/16  Yes Elsia Nigel Sloop, DO  loteprednol (LOTEMAX) 0.5 % ophthalmic suspension Place 1 drop into both eyes 2 (two) times daily. 01/09/14  Yes Tresa Garter, MD  mupirocin ointment (BACTROBAN) 2 % Apply 1 application topically daily as needed (for wound healing).  12/25/15  Yes Historical Provider, MD  propranolol ER (INDERAL LA) 60 MG 24 hr capsule Take 1 capsule (60 mg total) by mouth daily. 07/23/15  Yes Willeen Niece, MD  saxagliptin HCl (ONGLYZA) 2.5 MG TABS tablet Take 2.5 mg by mouth 2 (two) times daily.    Yes Historical Provider, MD  senna (SENOKOT) 8.6 MG TABS tablet Take 2 tablets (17.2 mg total) by mouth at bedtime as needed (for constipation). 07/23/15  Yes Willeen Niece, MD  sertraline (ZOLOFT) 50 MG tablet Take 1  tablet by mouth daily. 11/17/15  Yes Historical Provider, MD  triamcinolone cream (KENALOG) 0.1 % Apply 1 application topically 2 (two) times daily. Patient taking differently: Apply 1 application topically daily as needed (for itching).  02/11/16  Yes Tresa Garter, MD  diphenhydrAMINE (BENADRYL) 25 MG tablet Take 1 tablet (25 mg total) by mouth every 6 (six) hours as needed. Patient not taking: Reported on 02/25/2016 02/11/16   Tresa Garter, MD    Family History Family History  Problem Relation Age of Onset  . Diabetes Father     died in his 50's?/died in her 80's?  . Diabetes Sister     alive and well.    Social History Social History  Substance Use Topics  . Smoking status: Never Smoker  . Smokeless tobacco: Never Used  . Alcohol use No     Allergies  Penicillins and Hydrocortisone   Review of Systems Review of Systems  Constitutional: Negative for chills and fever.  HENT: Negative for congestion and rhinorrhea.   Eyes: Negative for redness and visual disturbance.  Respiratory: Negative for shortness of breath and wheezing.   Cardiovascular: Negative for chest pain and palpitations.  Gastrointestinal: Negative for nausea and vomiting.  Genitourinary: Positive for vaginal bleeding. Negative for dysuria and urgency.  Musculoskeletal: Negative for arthralgias and myalgias.  Skin: Negative for pallor and wound.  Neurological: Negative for dizziness and headaches.     Physical Exam Updated Vital Signs BP (!) 104/51   Pulse 84   Temp 97.9 F (36.6 C) (Oral)   Resp 15   LMP 05/21/2013   SpO2 100%   Physical Exam  Constitutional: She is oriented to person, place, and time. She appears well-developed and well-nourished. No distress.  HENT:  Head: Normocephalic and atraumatic.  Superficial lac not through the dermis to the right forehead  Eyes: EOM are normal. Pupils are equal, round, and reactive to light.  Neck: Normal range of motion. Neck supple.    Cardiovascular: Normal rate and regular rhythm.  Exam reveals no gallop and no friction rub.   No murmur heard. Pulmonary/Chest: Effort normal. She has no wheezes. She has no rales.  Abdominal: Soft. She exhibits no distension. There is no tenderness.  Genitourinary:  Genitourinary Comments: Large clots found in the vagina also had clots falling of the cervix upon placement of the speculum.  Musculoskeletal: She exhibits no edema or tenderness.  Neurological: She is alert and oriented to person, place, and time.  Skin: Skin is warm and dry. She is not diaphoretic.  Psychiatric: She has a normal mood and affect. Her behavior is normal.  Nursing note and vitals reviewed.    ED Treatments / Results  Labs (all labs ordered are listed, but only abnormal results are displayed) Labs Reviewed  CBC WITH DIFFERENTIAL/PLATELET - Abnormal; Notable for the following:       Result Value   RBC 2.63 (*)    Hemoglobin 7.1 (*)    HCT 22.8 (*)    All other components within normal limits  BASIC METABOLIC PANEL - Abnormal; Notable for the following:    Glucose, Bld 181 (*)    Creatinine, Ser 1.18 (*)    Calcium 8.4 (*)    GFR calc non Af Amer 51 (*)    GFR calc Af Amer 59 (*)    Anion gap 4 (*)    All other components within normal limits  I-STAT CHEM 8, ED - Abnormal; Notable for the following:    BUN 22 (*)    Creatinine, Ser 1.20 (*)    Glucose, Bld 179 (*)    Hemoglobin 6.5 (*)    HCT 19.0 (*)    All other components within normal limits  WET PREP, GENITAL  HIV ANTIBODY (ROUTINE TESTING)  I-STAT BETA HCG BLOOD, ED ([facility], [facility], [facility] ONLY)  TYPE AND SCREEN  PREPARE RBC (CROSSMATCH)  ABO/RH  GC/CHLAMYDIA PROBE AMP ([place]) NOT AT [facility]    EKG  EKG Interpretation  Date/Time:  Thursday February 25 2016 18:15:07 EDT Ventricular Rate:  96 PR Interval:    QRS Duration: 93 QT Interval:  385 QTC Calculation: 487 R Axis:   -26 Text Interpretation:  Sinus rhythm Borderline prolonged PR  interval Borderline left axis deviation Low voltage, precordial leads Borderline prolonged QT interval Baseline wander in lead(s) III aVF Otherwise no significant change Confirmed by  Lynnda Wiersma MD, Quillian Quince [phone]) on 02/25/2016 8:35:12 PM       Radiology Ct Abdomen Pelvis Wo Contrast  Result Date: 02/25/2016 CLINICAL DATA:  Vaginal bleeding. Anemia. 6 g drop in hemoglobin over 2 weeks. EXAM: CT ABDOMEN AND PELVIS WITHOUT CONTRAST TECHNIQUE: Multidetector CT imaging of the abdomen and pelvis was performed following the standard protocol without IV contrast. COMPARISON:  CTA abdomen and pelvis 01/14/2016 FINDINGS: Lower chest: The lung bases are clear without focal nodule, mass, or airspace disease. The heart size is normal. No pleural or pericardial effusion is present. The distal esophagus is mildly dilated, unchanged. Hepatobiliary: No focal hepatic lesions are present. Liver contour is within normal limits. The gallbladder is markedly distended and filled with hyperdense material suggesting sludge. The common bile duct is within normal limits. Pancreas: No focal inflammatory changes are present. There is no solid or cystic mass lesion. No ductal dilation is present. Spleen: The spleen is within normal limits. Adrenals/Urinary Tract: The adrenal glands are normal bilaterally. Mild stranding is present about the kidneys. No focal mass lesion or stone is present. There is no hydronephrosis. The ureters are within normal limits bilaterally. The urinary bladder is unremarkable. Stomach/Bowel: The stomach and duodenum are within normal limits. The small bowel is unremarkable. The appendix is visualized and normal. The ascending and transverse colon are within normal limits. The descending and rectosigmoid colon are normal. Moderate stool is present in the distal sigmoid and rectum. Vascular/Lymphatic: Extensive vascular calcifications have been previously documented by the CTA. This predominantly involves medium and  distal small vessels without significant calcification at the aorta. No significant adenopathy is present. Reproductive: Uterus and adnexa are within normal limits for age. There is no focal lesion to explain the patient's vaginal bleeding. Other: No significant free fluid or free air is present. Musculoskeletal: Endplate degenerative changes and sclerosis are again noted L5-S1. Vertebral body heights and alignment are otherwise normal throughout the remainder of the thoracolumbar spine. The bony pelvis is intact. IMPRESSION: 1. Moderate stool in the sigmoid colon and rectum without obstruction. 2. No discrete lesion to explain vaginal bleeding. 3. Extensive medium and distal small vessel calcifications are again noted. 4. Gallbladder distention and probable sludge. 5. Slight dilation of the distal esophagus.  This may be functional. Electronically Signed   By: San Morelle M.D.   On: 02/25/2016 21:28   Ct Head Wo Contrast  Result Date: 02/25/2016 CLINICAL DATA:  Fall.  Hit head on coffee table. EXAM: CT HEAD WITHOUT CONTRAST CT CERVICAL SPINE WITHOUT CONTRAST TECHNIQUE: Multidetector CT imaging of the head and cervical spine was performed following the standard protocol without intravenous contrast. Multiplanar CT image reconstructions of the cervical spine were also generated. COMPARISON:  CT head without contrast 01/16/2013. FINDINGS: CT HEAD FINDINGS No acute infarct, hemorrhage, or mass lesion is present. The ventricles are of normal size. The basal ganglia are intact. No significant extra-axial fluid collection is present. Calvarium is intact. Fluid levels are present in the frontal sinuses and anterior ethmoid air cells. Minimal mucosal thickening is present in the left maxillary sinus. There is some fluid or debris in the left sphenoid sinus. The left mastoid air cells are clear. Fluid is present within the right mastoid air cells. No obstructing nasopharyngeal lesion is evident. A lucency along  the left frontal sinus is stable. Minimal right supraorbital scalp soft tissue swelling is present without an underlying fracture. CT CERVICAL SPINE FINDINGS The cervical spine is imaged from the skullbase through T1-2. There straightening  of the normal cervical lordosis. This may be positional as the patient is an a hard collar. Degenerate changes are present at C3-4 with left greater than right uncovertebral spurring. Vertebral body heights and alignment are maintained. No acute fracture or traumatic subluxation is present. Soft tissue windows demonstrate a calcified right thyroid nodule measuring 1.9 cm. Thyroid nodules have been followed by ultrasound ended not require any immediate follow-up. The lung apices are clear. IMPRESSION: 1. No acute intracranial abnormality. 2. Bilateral frontal sinus and anterior ethmoid sinus disease. This is likely inflammatory and not related to trauma. No associated fractures are present. 3. Right mastoid effusion. No obstructing nasopharyngeal lesion is present. 4. PICC focal degenerative changes at C3-4 without acute fracture or traumatic subluxation in the cervical spine. Electronically Signed   By: San Morelle M.D.   On: 02/25/2016 21:21   Ct Cervical Spine Wo Contrast  Result Date: 02/25/2016 CLINICAL DATA:  Fall.  Hit head on coffee table. EXAM: CT HEAD WITHOUT CONTRAST CT CERVICAL SPINE WITHOUT CONTRAST TECHNIQUE: Multidetector CT imaging of the head and cervical spine was performed following the standard protocol without intravenous contrast. Multiplanar CT image reconstructions of the cervical spine were also generated. COMPARISON:  CT head without contrast 01/16/2013. FINDINGS: CT HEAD FINDINGS No acute infarct, hemorrhage, or mass lesion is present. The ventricles are of normal size. The basal ganglia are intact. No significant extra-axial fluid collection is present. Calvarium is intact. Fluid levels are present in the frontal sinuses and anterior ethmoid  air cells. Minimal mucosal thickening is present in the left maxillary sinus. There is some fluid or debris in the left sphenoid sinus. The left mastoid air cells are clear. Fluid is present within the right mastoid air cells. No obstructing nasopharyngeal lesion is evident. A lucency along the left frontal sinus is stable. Minimal right supraorbital scalp soft tissue swelling is present without an underlying fracture. CT CERVICAL SPINE FINDINGS The cervical spine is imaged from the skullbase through T1-2. There straightening of the normal cervical lordosis. This may be positional as the patient is an a hard collar. Degenerate changes are present at C3-4 with left greater than right uncovertebral spurring. Vertebral body heights and alignment are maintained. No acute fracture or traumatic subluxation is present. Soft tissue windows demonstrate a calcified right thyroid nodule measuring 1.9 cm. Thyroid nodules have been followed by ultrasound ended not require any immediate follow-up. The lung apices are clear. IMPRESSION: 1. No acute intracranial abnormality. 2. Bilateral frontal sinus and anterior ethmoid sinus disease. This is likely inflammatory and not related to trauma. No associated fractures are present. 3. Right mastoid effusion. No obstructing nasopharyngeal lesion is present. 4. PICC focal degenerative changes at C3-4 without acute fracture or traumatic subluxation in the cervical spine. Electronically Signed   By: San Morelle M.D.   On: 02/25/2016 21:21    Procedures Procedures (including critical care time)  Medications Ordered in ED Medications  0.9 %  sodium chloride infusion ( Intravenous New Bag/Given 02/25/16 2227)  megestrol (MEGACE) tablet 40 mg (40 mg Oral Given 02/25/16 2225)     Initial Impression / Assessment and Plan / ED Course  I have reviewed the triage vital signs and the nursing notes.  Pertinent labs & imaging results that were available during my care of the  patient were reviewed by me and considered in my medical decision making (see chart for details).  Clinical Course    55 yo F With a chief complaint of vaginal bleeding. Patient  is a 5 g hemoglobin drop over the course of 2 weeks. We'll transfuse blood. Discussed with Bovard-Stuckert, OB/GYN. After discussing the case, recommended megace 40mg  bid.    CRITICAL CARE Performed by: Cecilio Asper   Total critical care time: 76 minutes  Critical care time was exclusive of separately billable procedures and treating other patients.  Critical care was necessary to treat or prevent imminent or life-threatening deterioration.  Critical care was time spent personally by me on the following activities: development of treatment plan with patient and/or surrogate as well as nursing, discussions with consultants, evaluation of patient's response to treatment, examination of patient, obtaining history from patient or surrogate, ordering and performing treatments and interventions, ordering and review of laboratory studies, ordering and review of radiographic studies, pulse oximetry and re-evaluation of patient's condition.  The patients results and plan were reviewed and discussed.   Any x-rays performed were independently reviewed by myself.   Differential diagnosis were considered with the presenting HPI.  Medications  0.9 %  sodium chloride infusion ( Intravenous New Bag/Given 02/25/16 2227)  megestrol (MEGACE) tablet 40 mg (40 mg Oral Given 02/25/16 2225)    Vitals:   02/25/16 2245 02/25/16 2245 02/25/16 2300 02/25/16 2316  BP: (!) 110/50 (!) 110/50 (!) 95/54 (!) 104/51  Pulse: 91 92 87 84  Resp: 20 16 23 15   Temp:  97.9 F (36.6 C)    TempSrc:  Oral    SpO2: 97% 100% 100% 100%    Final diagnoses:  Vaginal bleeding  Acute blood loss anemia    Admission/ observation were discussed with the admitting physician, patient and/or family and they are comfortable with the plan.    Final Clinical Impressions(s) / ED Diagnoses   Final diagnoses:  Vaginal bleeding  Acute blood loss anemia    New Prescriptions New Prescriptions   No medications on file     Deno Etienne, DO 02/25/16 2359

## 2016-02-26 ENCOUNTER — Encounter (HOSPITAL_COMMUNITY): Payer: Self-pay

## 2016-02-26 DIAGNOSIS — N939 Abnormal uterine and vaginal bleeding, unspecified: Principal | ICD-10-CM

## 2016-02-26 LAB — BASIC METABOLIC PANEL
ANION GAP: 8 (ref 5–15)
BUN: 20 mg/dL (ref 6–20)
CALCIUM: 8.2 mg/dL — AB (ref 8.9–10.3)
CO2: 24 mmol/L (ref 22–32)
Chloride: 107 mmol/L (ref 101–111)
Creatinine, Ser: 1.07 mg/dL — ABNORMAL HIGH (ref 0.44–1.00)
GFR calc Af Amer: >60 mL/min (ref >60)
GFR, EST NON AFRICAN AMERICAN: 57 mL/min — AB (ref >60)
GLUCOSE: 145 mg/dL — AB (ref 65–99)
Potassium: 4.4 mmol/L (ref 3.5–5.1)
Sodium: 139 mmol/L (ref 135–145)

## 2016-02-26 LAB — CBC
HCT: 25.7 % — ABNORMAL LOW (ref 36.0–46.0)
Hemoglobin: 8.8 g/dL — ABNORMAL LOW (ref 12.0–15.0)
MCH: 28.8 pg (ref 26.0–34.0)
MCHC: 34.2 g/dL (ref 30.0–36.0)
MCV: 84 fL (ref 78.0–100.0)
PLATELETS: 189 10*3/uL (ref 150–400)
RBC: 3.06 MIL/uL — ABNORMAL LOW (ref 3.87–5.11)
RDW: 15.5 % (ref 11.5–15.5)
WBC: 13.7 10*3/uL — ABNORMAL HIGH (ref 4.0–10.5)

## 2016-02-26 LAB — HIV ANTIBODY (ROUTINE TESTING W REFLEX): HIV Screen 4th Generation wRfx: NONREACTIVE

## 2016-02-26 LAB — PREPARE RBC (CROSSMATCH)

## 2016-02-26 LAB — GLUCOSE, CAPILLARY
GLUCOSE-CAPILLARY: 144 mg/dL — AB (ref 65–99)
Glucose-Capillary: 153 mg/dL — ABNORMAL HIGH (ref 65–99)
Glucose-Capillary: 163 mg/dL — ABNORMAL HIGH (ref 65–99)
Glucose-Capillary: 165 mg/dL — ABNORMAL HIGH (ref 65–99)
Glucose-Capillary: 192 mg/dL — ABNORMAL HIGH (ref 65–99)

## 2016-02-26 LAB — HEMOGLOBIN AND HEMATOCRIT, BLOOD
HCT: 24.6 % — ABNORMAL LOW (ref 36.0–46.0)
Hemoglobin: 8 g/dL — ABNORMAL LOW (ref 12.0–15.0)

## 2016-02-26 LAB — T4, FREE: Free T4: 0.88 ng/dL (ref 0.61–1.12)

## 2016-02-26 LAB — TSH: TSH: 0.296 u[IU]/mL — ABNORMAL LOW (ref 0.350–4.500)

## 2016-02-26 LAB — GC/CHLAMYDIA PROBE AMP (~~LOC~~) NOT AT ARMC
Chlamydia: NEGATIVE
Neisseria Gonorrhea: NEGATIVE

## 2016-02-26 MED ORDER — CLOPIDOGREL BISULFATE 75 MG PO TABS: 75.0000 mg | ORAL_TABLET | Freq: Every day | ORAL | Status: DC

## 2016-02-26 MED ORDER — SODIUM CHLORIDE 0.9 % IV SOLN
INTRAVENOUS | Status: AC
  Administered 2016-02-26: 04:00:00 via INTRAVENOUS

## 2016-02-26 MED ORDER — SODIUM CHLORIDE 0.9 % IV SOLN
Freq: Once | INTRAVENOUS | Status: AC
  Administered 2016-02-28: 10 mL/h via INTRAVENOUS

## 2016-02-26 MED ORDER — ACETAMINOPHEN 650 MG RE SUPP: 650.0000 mg | Freq: Four times a day (QID) | RECTAL | Status: DC | PRN

## 2016-02-26 MED ORDER — ACETAMINOPHEN 325 MG PO TABS: 650.0000 mg | ORAL_TABLET | Freq: Four times a day (QID) | ORAL | Status: DC | PRN

## 2016-02-26 MED ORDER — SULFAMETHOXAZOLE-TRIMETHOPRIM 800-160 MG PO TABS
1.0000 | ORAL_TABLET | Freq: Two times a day (BID) | ORAL | Status: DC
  Administered 2016-02-26 – 2016-02-28 (×6): 1 via ORAL
  Filled 2016-02-26 (×6): qty 1

## 2016-02-26 MED ORDER — ATORVASTATIN CALCIUM 40 MG PO TABS
40.0000 mg | ORAL_TABLET | Freq: Every day | ORAL | Status: DC
  Administered 2016-02-26 – 2016-02-27 (×3): 40 mg via ORAL
  Filled 2016-02-26 (×3): qty 1

## 2016-02-26 MED ORDER — INSULIN ASPART 100 UNIT/ML ~~LOC~~ SOLN: 0.0000 [IU] | Freq: Every day | SUBCUTANEOUS | Status: DC

## 2016-02-26 MED ORDER — INSULIN ASPART 100 UNIT/ML ~~LOC~~ SOLN
0.0000 [IU] | Freq: Three times a day (TID) | SUBCUTANEOUS | Status: DC
  Administered 2016-02-26: 2 [IU] via SUBCUTANEOUS
  Administered 2016-02-26 – 2016-02-27 (×3): 3 [IU] via SUBCUTANEOUS
  Administered 2016-02-27 (×2): 2 [IU] via SUBCUTANEOUS
  Administered 2016-02-28: 5 [IU] via SUBCUTANEOUS

## 2016-02-26 MED ORDER — LEVOFLOXACIN 750 MG PO TABS
750.0000 mg | ORAL_TABLET | Freq: Every day | ORAL | Status: DC
  Administered 2016-02-26 – 2016-02-28 (×3): 750 mg via ORAL
  Filled 2016-02-26 (×3): qty 1

## 2016-02-26 MED ORDER — INSULIN GLARGINE 100 UNIT/ML ~~LOC~~ SOLN
10.0000 [IU] | Freq: Every day | SUBCUTANEOUS | Status: DC
  Administered 2016-02-26 – 2016-02-28 (×3): 10 [IU] via SUBCUTANEOUS
  Filled 2016-02-26 (×3): qty 0.1

## 2016-02-26 MED ORDER — LOTEPREDNOL ETABONATE 0.5 % OP SUSP
1.0000 [drp] | Freq: Two times a day (BID) | OPHTHALMIC | Status: DC
  Administered 2016-02-26 – 2016-02-28 (×3): 1 [drp] via OPHTHALMIC
  Filled 2016-02-26: qty 5

## 2016-02-26 MED ORDER — DULOXETINE HCL 30 MG PO CPEP: 30.0000 mg | ORAL_CAPSULE | Freq: Every day | ORAL | Status: DC

## 2016-02-26 MED ORDER — MEGESTROL ACETATE 40 MG PO TABS
40.0000 mg | ORAL_TABLET | Freq: Two times a day (BID) | ORAL | Status: DC
  Administered 2016-02-26 (×3): 40 mg via ORAL
  Filled 2016-02-26 (×4): qty 1

## 2016-02-26 MED ORDER — GABAPENTIN 300 MG PO CAPS
300.0000 mg | ORAL_CAPSULE | Freq: Three times a day (TID) | ORAL | Status: DC
  Administered 2016-02-26 – 2016-02-28 (×8): 300 mg via ORAL
  Filled 2016-02-26 (×8): qty 1

## 2016-02-26 MED ORDER — SERTRALINE HCL 50 MG PO TABS
50.0000 mg | ORAL_TABLET | Freq: Every day | ORAL | Status: DC
  Administered 2016-02-26 – 2016-02-28 (×3): 50 mg via ORAL
  Filled 2016-02-26 (×3): qty 1

## 2016-02-26 MED ORDER — ONDANSETRON 4 MG PO TBDP: 4.0000 mg | ORAL_TABLET | Freq: Three times a day (TID) | ORAL | Status: DC | PRN

## 2016-02-26 NOTE — H&P (Signed)
[facility] Admission History and Physical Service Pager: [phone]  Patient name: Debbie [name] Medical record number: [id number] Date of birth: 06-Mar-1961 Age: 55 y.o. Gender: female  Primary Care Provider: Angelica Chessman, MD Consultants: None Code Status: Full  Chief Complaint: vaginal bleeding  Assessment and Plan: [name] is a 55 y.o. 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    Vaginal Bleeding- prolonged menstrual bleeding in setting of perimenopause. Hemodynamically stable but with mild hypotension. Differential includes structural abnormalities ( fibroid, polyp), trauma, endocrine abnormalities, more concerning causes like malignancy. Pelvic US showed fibroid uterus with endometrial strip 5 mm making malignancy less likely, bleeding, Hgb trended to 6.5 in the ED from 7.1 on presentation. Clinically no evidence of diffuse blood loss, had clots which after clearing no evidence of profuse bleeding.  - admit for blood transfusion, 2 units, follow post transfusion Hct - Discussed with Gyn treatment with the decision to start megace 40 mg PO BID, with Gyn follow up - discuss weaning to provera 5 mg qD on discharge - pad counts - TSH - will need EMB as outpatient - zofran PRN nausea  Syncope- likely due to acute blood loss with subsequent hypotension and anemia. CT head and c spine negative for acute process - tylenol PRN pain  L great toe cellulitis: Previously admitted on 7/25 for concern for left toe osteomyelitis. She refused surgical management as well as PICC for prolonged IV antibiotics. She was discharged on oral levoquin and bactrim. No fevers at home, improving pain, WBC today 10.4. It is uncertain whether she was taking the bactrim as it was not on her home med list - Continue home levaquin, bactrim  -  continue to counsel on preferred method of treatment with surgery  Type II DM: History of poorly controlled DM on lantus 20 qHS, invokana, Onglyza at home  A1c 6.4 on 02/12/2016 - Lantus 10 U qD and titrate up as needed - SSI - Continue gabapentin - CBG ACHS  Peripheral arterial disease: Followed by Dr. Bridgett Larsson with vascular surgery. On ASA and Plavix at home.  - hold home Plavix and ASA in setting of bleeding - continue atovastatin  HTN: Hypotensive in the ED to 90s-low 100s/50s On  propranolol at home.  - Hold propranolol for now  Renal- Cr 1.2 on admission improved from discharge creatinine of 1.3 on discharge 7/27. This most recent Cr elevation was from a baseline of 0.9 and thought to be due to antibiotic use - follow Cr  Depression/anxiety: Reportedly on both Cymbalta and Zoloft at home.  - Continue Zoloft - Will not continue Cymbalta as duplicate therapy with SSRI and SNRI  FEN/GI: carb modified heart healthy diet, NS@100cc /hr, Senna Prophylaxis: SCDs   History of Present Illness: History obtained by video Arabic interpreter    Debbie [name] is a 54 y.o. female presenting with vaginal bleeding since discharge from last admission on 02/04/2016. Her bleeding has become increasingly heavy with large clots. As the patient is blind, she lives wither sister who often cleans up after her. Her sister has noted large blood clots in the floor in the bathroom.  Patient reports she has not reached the menopause yet, however her periods are coming less frequently, now every 2-3 months. Previously her menses were regular. She reports abdominal cramping that come and go. Denies any sexual intercourse or instrumentation of the vagina. Denies vaginal irritation or  discharge.  Of note today she felt her bleeding was ever worse, felt dizzy with headache and fainted. She was then taken to the ED for evaluation. She had emesis x1 en route to the ED   Review Of Systems: Per HPI with the following  additions: recent illness, fevers, chest pain, shortness of breath Otherwise the remainder of the systems were negative.  Patient Active Problem List   Diagnosis Date Noted  . Vaginal bleeding 02/25/2016  . Pain and swelling of toe of left foot   . Cellulitis of toe of left foot   . Atherosclerotic peripheral vascular disease with rest pain ([place]) 01/22/2016  . Poorly controlled type 2 diabetes mellitus ([place]) 11/05/2015  . PAD (peripheral artery disease) ([place]) 11/05/2015  . Hypoglycemia 04/13/2015  . Chronic pain syndrome 02/05/2015  . Fall at home 02/05/2015  . PTSD (post-traumatic stress disorder) 01/09/2014  . Fungal dermatitis 01/09/2014  . Furuncle 08/02/2013  . Uncontrolled diabetes mellitus ([place]) 08/02/2013  . Generalized anxiety disorder 12/13/2012  . Polyneuropathy in diabetes(357.2) 12/13/2012  . Diabetic retinopathy ([place]) 12/12/2012  . Diabetic foot ulcers ([place]) 12/05/2012  . Diabetic foot infection ([place]) 11/30/2012  . Unspecified hypothyroidism 11/30/2012  . Adjustment disorder with mixed anxiety and depressed mood 11/10/2012  . Diabetes mellitus type 2 with peripheral artery disease ([place]) 11/09/2012  . HTN (hypertension) 11/09/2012    Past Medical History: Past Medical History:  Diagnosis Date  . Blind   . Cataract   . Chest pain    a. ? MI in 2010, [place]->medically managed  . Diabetic foot ulcer ([place])   . Hypertension    x ~ 10 years  . Hypothyroidism   . Obesity   . Type II or unspecified type diabetes mellitus with unspecified complication, uncontrolled    x ~ 10 years    Past Surgical History: Past Surgical History:  Procedure Laterality Date  . BACK SURGERY    . PERIPHERAL VASCULAR CATHETERIZATION N/A 02/01/2016   Procedure: Abdominal Aortogram w/Lower Extremity;  Surgeon: Conrad , MD;  Location: [facility];  Service: Cardiovascular;  Laterality: N/A;  . TUBAL LIGATION      Social History: Social History  Substance Use Topics  .  Smoking status: Never Smoker  . Smokeless tobacco: Never Used  . Alcohol use No   Additional social history: denies etoh, drug use, tobacco Please also refer to relevant sections of EMR.  Family History: Family History  Problem Relation Age of Onset  . Diabetes Father     died in his 50's?/died in her 25's?  . Diabetes Sister     alive and well.     Allergies and Medications: Allergies  Allergen Reactions  . Penicillins Rash    Has patient had a PCN reaction causing immediate rash, facial/tongue/throat swelling, SOB or lightheadedness with hypotension: Yes Has patient had a PCN reaction causing severe rash involving mucus membranes or skin necrosis: No Has patient had a PCN reaction that required hospitalization No Has patient had a PCN reaction occurring within the last 10 years: No If all of the above answers are "NO", then may proceed with Cephalosporin use.   Marland Kitchen Hydrocortisone Rash    Redness   No current facility-administered medications on file prior to encounter.    Current Outpatient Prescriptions on File Prior to Encounter  Medication Sig Dispense Refill  . ACCU-CHEK SOFTCLIX LANCETS lancets     . aspirin EC 81 MG EC tablet Take 1 tablet (81 mg total) by  mouth daily. 30 tablet 0  . atorvastatin (LIPITOR) 40 MG tablet Take 1 tablet (40 mg total) by mouth daily at 8 pm. (Patient taking differently: Take 40 mg by mouth every morning. ) 90 tablet 3  . canagliflozin (INVOKANA) 100 MG TABS tablet TAKE ONE (1) TABLET BY MOUTH EVERY DAY 30 tablet 3  . clopidogrel (PLAVIX) 75 MG tablet Take 1 tablet (75 mg total) by mouth daily. 30 tablet 11  . clotrimazole (LOTRIMIN) 1 % cream APPLY TO AFFECTED AREAS TWICE DAILY (Patient taking differently: Apply 1 application topically 2 (two) times daily. ) 60 g 0  . DULoxetine (CYMBALTA) 30 MG capsule Take 1 capsule (30 mg total) by mouth daily. 30 capsule 3  . gabapentin (NEURONTIN) 300 MG capsule Take 1 capsule (300 mg total) by mouth 3  (three) times daily. (Patient taking differently: Take 300 mg by mouth 2 (two) times daily. ) 90 capsule 3  . glucose blood (ACCU-CHEK AVIVA PLUS) test strip Check blood sugar 3 times daily. 100 each 12  . insulin aspart (NOVOLOG) 100 UNIT/ML injection SEE NOTES Dispense QH 1 month (Patient taking differently: Inject 10 Units into the skin 3 (three) times daily with meals. Three times daily per Sliding scale: 121-150, 2 units : 151-200, 3 units : 201-250, 5 units : 251-300 8 units : 301-350, 11 units : 351-400, 15 units) 20 mL 2  . insulin glargine (LANTUS) 100 UNIT/ML injection Inject 0.2 mLs (20 Units total) into the skin at bedtime. 10 mL 3  . Insulin Syringe-Needle U-100 (INSULIN SYRINGE 1CC/30GX5/16") 30G X 5/16" 1 ML MISC USE AS DIRECTED WITH LANTUS AND NOVOLOG INJECTIONS 100 each 12  . Lancets (ACCU-CHEK SOFT TOUCH) lancets Use as instructed 100 each 12  . levofloxacin (LEVAQUIN) 750 MG tablet Take 1 tablet (750 mg total) by mouth daily. 42 tablet 0  . loteprednol (LOTEMAX) 0.5 % ophthalmic suspension Place 1 drop into both eyes 2 (two) times daily. 15 mL 3  . propranolol ER (INDERAL LA) 60 MG 24 hr capsule Take 1 capsule (60 mg total) by mouth daily. 90 capsule 0  . saxagliptin HCl (ONGLYZA) 2.5 MG TABS tablet Take 2.5 mg by mouth 2 (two) times daily.     Marland Kitchen senna (SENOKOT) 8.6 MG TABS tablet Take 2 tablets (17.2 mg total) by mouth at bedtime as needed (for constipation). 30 each 0  . sertraline (ZOLOFT) 50 MG tablet Take 1 tablet by mouth daily.  2  . triamcinolone cream (KENALOG) 0.1 % Apply 1 application topically 2 (two) times daily. (Patient taking differently: Apply 1 application topically daily as needed (for itching). ) 80 g 3  . diphenhydrAMINE (BENADRYL) 25 MG tablet Take 1 tablet (25 mg total) by mouth every 6 (six) hours as needed. (Patient not taking: Reported on 02/25/2016) 60 tablet 3    Objective: BP (!) 104/51   Pulse 84   Temp 97.9 F (36.6 C) (Oral)   Resp 15   LMP  05/21/2013   SpO2 100%  Exam: General: NAD, lying in bed,  Eyes: bilateral blindness, wearing shaded glasses bilaterally ENTM: MMM Cardiovascular: RRR Respiratory: CTAB Abdomen: soft, non tender to palpation, + BS Extremities: right midtarsal amputation, left great toe erythema non tender to palpation, no fluctuance Neuro: AOx3 Psych: normal mood and affect Pelvic: Pelvic: Normal EGBUS, normal vaginal canal with mild amount of blood, cervix unable to be appreciated due to patient poor tolerance of exam,    Labs and Imaging: CBC BMET   Recent  Labs Lab 02/25/16 1915 02/25/16 1942  WBC 10.4  --   HGB 7.1* 6.5*  HCT 22.8* 19.0*  PLT 247  --     Recent Labs Lab 02/25/16 1915 02/25/16 1942  NA 136 138  K 4.5 4.6  CL 107 104  CO2 25  --   BUN 20 22*  CREATININE 1.18* 1.20*  GLUCOSE 181* 179*  CALCIUM 8.4*  --      US Pelvic: Endometrial strip 10mm Multiple uterine fibroids are demonstrated. Normal endometrial thickness. Both ovaries are visualized and appear normal on trans abdominal scanning.  CT abd pelvis  Moderate stool in the sigmoid colon and rectum without obstruction. 2. No discrete lesion to explain vaginal bleeding. 3. Extensive medium and distal small vessel calcifications are again noted. 4. Gallbladder distention and probable sludge. 5. Slight dilation of the distal esophagus.  This may be functional.  CT C spine  No acute intracranial abnormality. 2. Bilateral frontal sinus and anterior ethmoid sinus disease. This is likely inflammatory and not related to trauma. No associated fractures are present. 3. Right mastoid effusion. No obstructing nasopharyngeal lesion is present. 4. PICC focal degenerative changes at C3-4 without acute fracture or traumatic subluxation in the cervical spine.  CT  Head  No acute intracranial abnormality. 2. Bilateral frontal sinus and anterior ethmoid sinus disease. This is likely inflammatory and not related to  trauma. No associated fractures are present. 3. Right mastoid effusion. No obstructing nasopharyngeal lesion is present. 4. PICC focal degenerative changes at C3-4 without acute fracture or traumatic subluxation in the cervical spine  Veatrice Bourbon, MD 02/26/2016, 12:33 AM PGY-3, [facility] pager: [phone], text pages welcome

## 2016-02-26 NOTE — Progress Notes (Signed)
New Admission Note:  Arrival Method: Via stretcher with nurse Tech Mental Orientation: Alert and oriented Telemetry: n/a Assessment: Completed Skin: ecchymosis present on left arm, left great toe wound, partial amputation of right foot and all toes IV: left AC,blood infusing on arrival Pain: See doc flowsheet Tubes:n/a Safety Measures: Safety Fall Prevention Plan discussed. Admission: initiated [place] Orientation: Patient has been orientated to the room, unit and the staff. Family: sister at bedside  Orders have been reviewed and implemented. Will continue to monitor the patient. Call light has been placed within reach and bed alarm has been activated.   Leandro Reasoner BSN, RN  Phone Number: [phone] [facility]

## 2016-02-26 NOTE — Progress Notes (Signed)
FPTS Interim Progress Note  S: Patient rests comfortably in bed, in no apparent distress.   O: BP (!) 110/51 (BP Location: Right Arm)   Pulse 89   Temp 98.4 F (36.9 C) (Oral)   Resp 16   Ht 5\' 6"  (1.676 m)   Wt 227 lb (103 kg)   LMP 05/21/2013   SpO2 98%   BMI 36.64 kg/m    GEN: NAD, rests comfortably in bed HEENT: R ear with cerumen, L ear with dried blood and pain CARD: RRR, no m/r/g PULM: CTA, no w/r/r ABD: soft, nontender, nondistended GYN: +bleeding in pad, no clots visualized but per report from nurse there were clots in previous pads  A/P: 1. Vaginal bleeding: Hgb improved to 8.8 s/p two units PRBC - No pad changes overnight, pad with slightly more blood than the single streak reported last night - started megace 40 mg PO BID, will wean to provera 5 mg qd on discharge - transfusion threshold 8 given PAD - TSH low at 0.296, ordered free T4 - Used 3 pads with bleeding and clots from 7:30 am - 4pm per nursing  2. L Great toe cellulitis - Distal toe osteo found on previous admission, patient declined intervention at that time although Dr. Delfino Lovett had indicated it may be necessary, and he recommended follow up with Dr. Sharol Given. Patient is amenable to possible surgery.  Will reach out to Dr. Jess Barters office to see whether she should be seen during this hospitalization or as an outpatient follow-up.  -  WBC elevated today at 13.7 from 10.4.  3. Syncope - CT head and spine negative.  CT head showed mastoid effusion. - L ear with apparent dried blood - mild pain, tylenol    Everrett Coombe, MD 02/26/2016, 12:11 PM PGY-1, [facility] pager [phone]

## 2016-02-26 NOTE — Progress Notes (Addendum)
10:30am Patient with 2 soaked pads with blood clots at this time. Will recheck in 2 hours.  15:15pm Patient with 1 blood soaked pad with copious clots.   18:30pm Patient with 1 lightly bloodied pad with one blood clot.

## 2016-02-27 DIAGNOSIS — N179 Acute kidney failure, unspecified: Secondary | ICD-10-CM

## 2016-02-27 DIAGNOSIS — Z603 Acculturation difficulty: Secondary | ICD-10-CM

## 2016-02-27 DIAGNOSIS — L03032 Cellulitis of left toe: Secondary | ICD-10-CM

## 2016-02-27 DIAGNOSIS — Z789 Other specified health status: Secondary | ICD-10-CM

## 2016-02-27 DIAGNOSIS — D62 Acute posthemorrhagic anemia: Secondary | ICD-10-CM

## 2016-02-27 LAB — CBC
HEMATOCRIT: 24.9 % — AB (ref 36.0–46.0)
Hemoglobin: 8.2 g/dL — ABNORMAL LOW (ref 12.0–15.0)
MCH: 28.6 pg (ref 26.0–34.0)
MCHC: 32.9 g/dL (ref 30.0–36.0)
MCV: 86.8 fL (ref 78.0–100.0)
PLATELETS: 210 10*3/uL (ref 150–400)
RBC: 2.87 MIL/uL — AB (ref 3.87–5.11)
RDW: 15.9 % — AB (ref 11.5–15.5)
WBC: 12.8 10*3/uL — AB (ref 4.0–10.5)

## 2016-02-27 LAB — BASIC METABOLIC PANEL
ANION GAP: 4 — AB (ref 5–15)
BUN: 24 mg/dL — ABNORMAL HIGH (ref 6–20)
CALCIUM: 8.1 mg/dL — AB (ref 8.9–10.3)
CHLORIDE: 108 mmol/L (ref 101–111)
CO2: 24 mmol/L (ref 22–32)
Creatinine, Ser: 1.21 mg/dL — ABNORMAL HIGH (ref 0.44–1.00)
GFR calc non Af Amer: 49 mL/min — ABNORMAL LOW (ref >60)
GFR, EST AFRICAN AMERICAN: 57 mL/min — AB (ref >60)
GLUCOSE: 134 mg/dL — AB (ref 65–99)
Potassium: 4.5 mmol/L (ref 3.5–5.1)
Sodium: 136 mmol/L (ref 135–145)

## 2016-02-27 LAB — GLUCOSE, CAPILLARY
GLUCOSE-CAPILLARY: 127 mg/dL — AB (ref 65–99)
GLUCOSE-CAPILLARY: 148 mg/dL — AB (ref 65–99)
Glucose-Capillary: 141 mg/dL — ABNORMAL HIGH (ref 65–99)
Glucose-Capillary: 157 mg/dL — ABNORMAL HIGH (ref 65–99)

## 2016-02-27 MED ORDER — MEDROXYPROGESTERONE ACETATE 2.5 MG PO TABS
5.0000 mg | ORAL_TABLET | Freq: Two times a day (BID) | ORAL | Status: DC
  Administered 2016-02-27 – 2016-02-28 (×3): 5 mg via ORAL
  Filled 2016-02-27 (×2): qty 1
  Filled 2016-02-27: qty 2
  Filled 2016-02-27: qty 1

## 2016-02-27 MED ORDER — SODIUM CHLORIDE 0.9 % IV SOLN
INTRAVENOUS | Status: AC
  Administered 2016-02-27 – 2016-02-28 (×2): via INTRAVENOUS
  Filled 2016-02-27 (×2): qty 1000

## 2016-02-27 NOTE — Progress Notes (Signed)
FPTS Interim Progress Note  S: Paged by RN, as sister asking for updates.  [place] phone interpreter ID [phone] utilized for Arabic interpretation.  O: BP (!) 117/44 (BP Location: Right Arm)   Pulse 82   Temp 99.3 F (37.4 C) (Oral)   Resp 17   Ht 5\' 6"  (1.676 m)   Wt 227 lb 8.2 oz (103.2 kg)   LMP 05/21/2013   SpO2 99%   BMI 36.72 kg/m   Gen: patient awake, lying on side in bed Cardio: rate regular Pulm: normal WOB on room air  A/P: Debbie [name] is a 55 y.o. female that is hospitalized for acute blood loss anemia.  She was transitioned from Megace to Provera today 8/19.  Her am labs were remarkable for hgb stable 8.2 and an AKI Cr 1.21.  I suspect her AKI to be secondary to volume depletion for acute blood loss.  Her vaginal bleeding has improved since admission.  Since she was transitioned this morning, she would benefit from continued monitoring for vaginal bleeding. - am hgb - am BMP - MIVFs started x12 hours - Will continue to monitor for urinary retention - Expect d/c 8/20 w/ close outpatient f/u w/ GYN - All questions answered  Debbie Norlander, DO 02/27/2016, 7:00 PM PGY-3, [facility] pager [phone]

## 2016-02-27 NOTE — Progress Notes (Signed)
[employer] used to explained procedure for in and cath.  Patient agreed to have I & O cath.  All questions answered using [employer].

## 2016-02-27 NOTE — Discharge Instructions (Signed)
You were admitted for excessive vaginal bleeding.  You were transfused with 2 units of blood during the admission.  You were also treated with medications to help decrease your vaginal bleeding.  You will be continuing this medication twice daily for the next 10 days.  You will need to be seen by a GYN to evaluate you further and discuss the hysterectomy that you are interested in.  You also need to follow up with Dr Sharol Given for your left big toe.  Continue taking your antibiotics as prescribed until you see him.    Abnormal Uterine Bleeding Abnormal uterine bleeding means bleeding from the vagina that is not your normal menstrual period. This can be:  Bleeding or spotting between periods.  Bleeding after sex (sexual intercourse).  Bleeding that is heavier or more than normal.  Periods that last longer than usual.  Bleeding after menopause. There are many problems that may cause this. Treatment will depend on the cause of the bleeding. Any kind of bleeding that is not normal should be reviewed by your doctor.  HOME CARE Watch your condition for any changes. These actions may lessen any discomfort you are having:  Do not use tampons or douches as told by your doctor.  Change your pads often. You should get regular pelvic exams and Pap tests. Keep all appointments for tests as told by your doctor. GET HELP IF:  You are bleeding for more than 1 week.  You feel dizzy at times. GET HELP RIGHT AWAY IF:   You pass out.  You have to change pads every 15 to 30 minutes.  You have belly pain.  You have a fever.  You become sweaty or weak.  You are passing large blood clots from the vagina.  You feel sick to your stomach (nauseous) and throw up (vomit). MAKE SURE YOU:  Understand these instructions.  Will watch your condition.  Will get help right away if you are not doing well or get worse.   This information is not intended to replace advice given to you by your health care  provider. Make sure you discuss any questions you have with your health care provider.   Document Released: 04/24/2009 Document Revised: 07/02/2013 Document Reviewed: 01/24/2013 Elsevier Interactive Patient Education [employer].

## 2016-02-27 NOTE — Progress Notes (Signed)
Maternity pad changed for mod amt. Bleeding with clots

## 2016-02-27 NOTE — Progress Notes (Signed)
Maternity pad changed for mod amt of vaginal bleeding with 3 clots @ 1240.  Patient in-and-out-cathed for 450 ml clear yellow urine.  Stated felt relieved.

## 2016-02-27 NOTE — Progress Notes (Signed)
Family Medicine Teaching Service Daily Progress Note Intern Pager: [phone]  Patient name: Debbie [name] Medical record number: [id number] Date of birth: 07-30-1960 Age: 55 y.o. 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   # Vaginal Bleeding-  Hgb 8.2. TSH 0.88.  Vaginal bleeding has lightened significantly per RN.  Patient asymptomatic. - phone c/s GYN: Megace 40 mg PO BID. Bleeding better so will transition to Provera, with Gyn follow up outpatient.  Will need endometrial bioppsy as outpatient.  Patient wanting hysterectomy. - Continue provera 5 mg qD on discharge - sanitary pad counts.  Has not been done, discussed with RN, who will monitor. - zofran PRN nausea - Will obtain hgb if bleeding increases.  # Decreased UOP. Per RN, patient not urinating but I/O show 9 unmeasured? - Bladder scan - I/O cath if >200cc  # Syncope- likely due to acute blood loss with subsequent hypotension and anemia. CT head and c spine negative for acute process - tylenol PRN pain  # L great toe cellulitis:Previously admitted on 7/25 for concern for left toe osteomyelitis. She refused surgical management as well as PICC for prolonged IV antibiotics. She was discharged on oral levaquin and bactrim. No fevers at home, improving pain, WBC today 12.8. - Continue home levaquin, bactrim  - Dr Sharol Given to see outpatient.  Patient has appt scheduled in 1 week.  Likely to proceed with amputation of toe.  # Type II [id number] 20 qHS, invokana, Onglyza at home  A1c 6.4 on 02/12/2016 - Lantus 10 U qD and titrate up as needed - SSI - Continue gabapentin -  CBG ACHS  # Peripheral arterial disease:Followed by Dr. Bridgett Larsson with vascular surgery. On ASA and Plavix at home.  - hold home Plavix and ASA in setting of bleeding - continue atovastatin  # [id number] in the ED to 90s-low 100s/50s On  propranolol at home.  - Hold propranolol for now  # Renal- Cr 1.2>1.07. This most recent Cr elevation was from a baseline of 0.9 and thought to be due to antibiotic use - Obtain am BMP  # Depression/anxiety: Reportedly on both Cymbalta and Zoloft at home.  - Continue Zoloft - Will not continue Cymbalta as duplicate therapy with SSRI and SNRI  FEN/GI: carb modified heart healthy diet, SLIV, Senna Prophylaxis: SCDs (active bleed)  Disposition: Discharge home this evening vs tomorrow am.  Will see how bleeding does on Provera.  Subjective:  Patient reports that she is doing ok this am.  She is unable to report on vaginal bleeding as she is unable to see sanitary pads.  She reports that she wishes to have a hysterectomy done.  She denies dizziness, palpitations, CP, SOB.  Endorses abdominal discomfort.  Tolerating PO without difficulty.  No N/V.  [address] Phone Interpreter ID [id number] used for Arabic interpretation of this encounter.  Objective: Temp:  [97.5 F (36.4 C)-99.6 F (37.6 C)] 99.6 F (37.6 C) (08/19 0744) Pulse Rate:  [80-92] 80 (08/19 0744) Resp:  [16-18] 17 (08/19 0744) BP: (89-120)/(39-47) 118/44 (08/19 0744) SpO2:  [97 %-98 %] 98 % (08/19 0744) Weight:  [227 lb 8.2 oz (103.2 kg)] 227 lb 8.2 oz (103.2 kg) (08/18 2047) Physical Exam:  General: awake, alert, sunglasses on, lying in bed Cardiovascular: RRR, no murmurs Respiratory: CTAB, normal WOB on room air Abdomen: soft, obese, +fullness in the RLQ, +mild TTP to bilateral lower quadrants. +BS Extremities: Left great toe with hyperpigmentation of the superiomedial aspect, no palpable fluctuance, no increased heat, +DP, light touch sensation in tact.  Laboratory:  Recent  Labs Lab 02/25/16 1915  02/26/16 0631 02/26/16 1702 02/27/16 0358  WBC 10.4  --  13.7*  --  12.8*  HGB 7.1*  < > 8.8* 8.0* 8.2*  HCT 22.8*  < > 25.7* 24.6* 24.9*  PLT 247  --  189  --  210  < > = values in this interval not displayed.  Recent Labs Lab 02/25/16 1915 02/25/16 1942 02/26/16 0631 02/27/16 0840  NA 136 138 139 136  K 4.5 4.6 4.4 4.5  CL 107 104 107 108  CO2 25  --  24 24  BUN 20 22* 20 24*  CREATININE 1.18* 1.20* 1.07* 1.21*  CALCIUM 8.4*  --  8.2* 8.1*  GLUCOSE 181* 179* 145* 134*    Imaging/Diagnostic Tests: Ct Abdomen Pelvis Wo Contrast  Result Date: 02/25/2016 CLINICAL DATA:  Vaginal bleeding. Anemia. 6 g drop in hemoglobin over 2 weeks. EXAM: CT ABDOMEN AND PELVIS WITHOUT CONTRAST TECHNIQUE: Multidetector CT imaging of the abdomen and pelvis was performed following the standard protocol without IV contrast. COMPARISON:  CTA abdomen and pelvis 01/14/2016 FINDINGS: Lower chest: The lung bases are clear without focal nodule, mass, or airspace disease. The heart size is normal. No pleural or pericardial effusion is present. The distal esophagus is mildly dilated, unchanged. Hepatobiliary: No focal hepatic lesions are present. Liver contour is within normal limits. The gallbladder is markedly distended and filled with hyperdense material suggesting sludge. The common bile duct is within normal limits. Pancreas: No focal inflammatory changes are present. There is no solid or cystic mass lesion. No ductal dilation is present. Spleen: The spleen is within normal limits. Adrenals/Urinary Tract: The adrenal glands are normal bilaterally. Mild stranding is present about the kidneys. No focal mass lesion or stone is present. There is no hydronephrosis. The ureters are within normal limits bilaterally. The urinary bladder is unremarkable. Stomach/Bowel: The stomach and duodenum are within normal limits. The small bowel is unremarkable. The appendix is visualized and normal. The  ascending and transverse colon are within normal limits. The descending and rectosigmoid colon are normal. Moderate stool is present in the distal sigmoid and rectum. Vascular/Lymphatic: Extensive vascular calcifications have been previously documented by the CTA. This predominantly involves medium and distal small vessels without significant calcification at the aorta. No significant adenopathy is present. Reproductive: Uterus and adnexa are within normal limits for age. There is no focal lesion to explain the patient's vaginal bleeding. Other: No significant free fluid or free air is present. Musculoskeletal: Endplate degenerative changes and sclerosis are again noted L5-S1. Vertebral body heights and alignment are otherwise normal throughout the remainder of the thoracolumbar spine. The bony pelvis is intact. IMPRESSION: 1. Moderate stool in the sigmoid colon and rectum without obstruction. 2. No discrete lesion to explain vaginal bleeding. 3. Extensive medium and distal small vessel calcifications are again noted. 4. Gallbladder distention and probable sludge. 5. Slight dilation of the distal esophagus.  This may be functional. Electronically Signed   By: San Morelle M.D.   On: 02/25/2016 21:28   Ct Head Wo Contrast  Result Date: 02/25/2016 CLINICAL DATA:  Fall.  Hit head on coffee table. EXAM:  CT HEAD WITHOUT CONTRAST CT CERVICAL SPINE WITHOUT CONTRAST TECHNIQUE: Multidetector CT imaging of the head and cervical spine was performed following the standard protocol without intravenous contrast. Multiplanar CT image reconstructions of the cervical spine were also generated. COMPARISON:  CT head without contrast 01/16/2013. FINDINGS: CT HEAD FINDINGS No acute infarct, hemorrhage, or mass lesion is present. The ventricles are of normal size. The basal ganglia are intact. No significant extra-axial fluid collection is present. Calvarium is intact. Fluid levels are present in the frontal sinuses and  anterior ethmoid air cells. Minimal mucosal thickening is present in the left maxillary sinus. There is some fluid or debris in the left sphenoid sinus. The left mastoid air cells are clear. Fluid is present within the right mastoid air cells. No obstructing nasopharyngeal lesion is evident. A lucency along the left frontal sinus is stable. Minimal right supraorbital scalp soft tissue swelling is present without an underlying fracture. CT CERVICAL SPINE FINDINGS The cervical spine is imaged from the skullbase through T1-2. There straightening of the normal cervical lordosis. This may be positional as the patient is an a hard collar. Degenerate changes are present at C3-4 with left greater than right uncovertebral spurring. Vertebral body heights and alignment are maintained. No acute fracture or traumatic subluxation is present. Soft tissue windows demonstrate a calcified right thyroid nodule measuring 1.9 cm. Thyroid nodules have been followed by ultrasound ended not require any immediate follow-up. The lung apices are clear. IMPRESSION: 1. No acute intracranial abnormality. 2. Bilateral frontal sinus and anterior ethmoid sinus disease. This is likely inflammatory and not related to trauma. No associated fractures are present. 3. Right mastoid effusion. No obstructing nasopharyngeal lesion is present. 4. PICC focal degenerative changes at C3-4 without acute fracture or traumatic subluxation in the cervical spine. Electronically Signed   By: San Morelle M.D.   On: 02/25/2016 21:21   Ct Cervical Spine Wo Contrast  Result Date: 02/25/2016 CLINICAL DATA:  Fall.  Hit head on coffee table. EXAM: CT HEAD WITHOUT CONTRAST CT CERVICAL SPINE WITHOUT CONTRAST TECHNIQUE: Multidetector CT imaging of the head and cervical spine was performed following the standard protocol without intravenous contrast. Multiplanar CT image reconstructions of the cervical spine were also generated. COMPARISON:  CT head without  contrast 01/16/2013. FINDINGS: CT HEAD FINDINGS No acute infarct, hemorrhage, or mass lesion is present. The ventricles are of normal size. The basal ganglia are intact. No significant extra-axial fluid collection is present. Calvarium is intact. Fluid levels are present in the frontal sinuses and anterior ethmoid air cells. Minimal mucosal thickening is present in the left maxillary sinus. There is some fluid or debris in the left sphenoid sinus. The left mastoid air cells are clear. Fluid is present within the right mastoid air cells. No obstructing nasopharyngeal lesion is evident. A lucency along the left frontal sinus is stable. Minimal right supraorbital scalp soft tissue swelling is present without an underlying fracture. CT CERVICAL SPINE FINDINGS The cervical spine is imaged from the skullbase through T1-2. There straightening of the normal cervical lordosis. This may be positional as the patient is an a hard collar. Degenerate changes are present at C3-4 with left greater than right uncovertebral spurring. Vertebral body heights and alignment are maintained. No acute fracture or traumatic subluxation is present. Soft tissue windows demonstrate a calcified right thyroid nodule measuring 1.9 cm. Thyroid nodules have been followed by ultrasound ended not require any immediate follow-up. The lung apices are clear. IMPRESSION: 1. No acute intracranial abnormality. 2. Bilateral  frontal sinus and anterior ethmoid sinus disease. This is likely inflammatory and not related to trauma. No associated fractures are present. 3. Right mastoid effusion. No obstructing nasopharyngeal lesion is present. 4. PICC focal degenerative changes at C3-4 without acute fracture or traumatic subluxation in the cervical spine. Electronically Signed   By: San Morelle M.D.   On: 02/25/2016 21:21   US Pelvis Complete  Result Date: 02/26/2016 CLINICAL DATA:  Vaginal bleeding for 3 weeks. Decreased hemoglobin 7.1. Patient  refused transvaginal scanning. EXAM: TRANSABDOMINAL ULTRASOUND OF PELVIS TECHNIQUE: Transabdominal ultrasound examination of the pelvis was performed including evaluation of the uterus, ovaries, adnexal regions, and pelvic cul-de-sac. COMPARISON:  CT abdomen and pelvis 02/25/2016 FINDINGS: Uterus Measurements: 10.2 x 4.1 x 5 cm. Uterus is neutral in orientation. Lobulated contour to the uterus with multiple hypoechoic nodules present in the myometrium consistent with fibroids. Largest is present anteriorly, measuring 2.6 cm maximal diameter. Additional lesion is present along the fundus measuring 2.4 cm maximal diameter. Lesion in the anterior lower uterine segment measures 2.4 cm diameter. Endometrium Thickness: 5 mm.  No focal abnormality visualized. Right ovary Measurements: 2.5 x 1.4 x 2 cm. Normal appearance/no adnexal mass. Left ovary Measurements: 2.8 x 1.7 x 1.9 cm. Normal appearance/no adnexal mass. Other findings:  No abnormal free fluid. IMPRESSION: Multiple uterine fibroids are demonstrated. Normal endometrial thickness. Both ovaries are visualized and appear normal on trans abdominal scanning. Electronically Signed   By: Lucienne Capers M.D.   On: 02/26/2016 00:43   Janora Norlander, DO 02/27/2016, 9:32 AM PGY-3, [place] Intern pager: [phone], text pages welcome

## 2016-02-27 NOTE — Discharge Summary (Signed)
[facility] Discharge Summary  Patient name: Dalal Hatem Medical record number: [id number] Date of birth: 1960/10/18 Age: 55 y.o. Gender: female Date of Admission: 02/25/2016  Date of Discharge: 02/28/2016 Admitting Physician: Leeanne Rio, MD  Primary Care Provider: Angelica Chessman, MD Consultants: phone c/s to GYN and Dr Sharol Given  Indication for Hospitalization: acute blood loss anemia  Discharge Diagnoses/Problem List:  Active Problems:   Vaginal bleeding   Acute blood loss anemia   Cellulitis of left toe   Language barrier affecting health care   AKI (acute kidney injury) [facility])  Disposition: Discharge home  Discharge Condition: Stable  Discharge Exam:  Temp:  [98.4 F (36.9 C)-99.3 F (37.4 C)] 98.4 F (36.9 C) (08/20 0802) Pulse Rate:  [82-90] 86 (08/20 0802) Resp:  [17-18] 18 (08/20 0802) BP: (101-127)/(33-53) 101/33 (08/20 0802) SpO2:  [96 %-99 %] 96 % (08/20 0802) Weight:  [230 lb (104.3 kg)] 230 lb (104.3 kg) (08/19 2038) Physical Exam: General: NAD, rests comfortably in bed Cardiovascular: RRR, no m/r/g Respiratory: CTA bil, no W/R/R Abdomen: soft, nontender, nondistended, normoactive BS Extremities: +great L toe cellulitis with apparent eschar, otherwise no LE edema bilaterally  Brief Hospital Course:  Patient was admitted for symptomatic anemia secondary prolonged menstrual bleeding.   She was found to be anemic with Hgb 7.1 that further decreased to 6.5 in the emergency department with IV fluids.  The patient was admitted for blood transfusion, 2 units.  Her follow up hgb improved.  The patient was treated with megace 40 mg for one day until bleeding improved and then she was transitioned to provera 5 mg qD, which she tolerated well over night and was considered stable for discharge.  On the day of discharge her bleeding had decreased but not resolved.  She was advised to schedule an appointment with her PCP within the next  week.  Additionally, patient was previously admitted and found to have L great toe osteomyelitis.  She was not amiable to amputation when seen by orthopedics on that admission, however on this admission expressed willingness to go ahead with the amputation if necessary. [place] agreed to see the patient in the office shortly within 2 days of discharge from the hospital and the patient was instructed to call the Monday morning after discharge to set up this appointment.  Issues for Follow Up:  1. Vaginal bleeding.  Recommend repeat CBC at hospital follow up.  Provera to be d/c in 9 days. 2. Needs GYN referral for uterine biopsy +/- hysterectomy 3. Left toe infection.  Discharged on Bactrim and Levaquin.  Needs close follow up for amputation.  Will likely need cardiac clearance for surgery.  Significant Procedures: transfused 2 units pRBCs  Significant Labs and Imaging:   Recent Labs Lab 02/26/16 0631 02/26/16 1702 02/27/16 0358 02/28/16 0509  WBC 13.7*  --  12.8* 12.7*  HGB 8.8* 8.0* 8.2* 8.3*  HCT 25.7* 24.6* 24.9* 25.2*  PLT 189  --  210 240    Recent Labs Lab 02/25/16 1915 02/25/16 1942 02/26/16 0631 02/27/16 0840 02/28/16 0509 02/28/16 1057  NA 136 138 139 136 139  --   K 4.5 4.6 4.4 4.5 5.3* 5.3*  CL 107 104 107 108 107  --   CO2 25  --  24 24 22   --   GLUCOSE 181* 179* 145* 134* 115*  --   BUN 20 22* 20 24* 20  --   CREATININE 1.18* 1.20* 1.07* 1.21* 1.08*  --  CALCIUM 8.4*  --  8.2* 8.1* 8.3*  --     No results found. Results/Tests Pending at Time of Discharge: none  Discharge Medications:    Medication List    STOP taking these medications   clopidogrel 75 MG tablet Commonly known as:  PLAVIX   mupirocin ointment 2 % Commonly known as:  BACTROBAN   sertraline 50 MG tablet Commonly known as:  ZOLOFT     TAKE these medications   ACCU-CHEK SOFTCLIX LANCETS lancets   accu-chek soft touch lancets Use as instructed   aspirin 81 MG EC  tablet Take 1 tablet (81 mg total) by mouth daily.   atorvastatin 40 MG tablet Commonly known as:  LIPITOR Take 1 tablet (40 mg total) by mouth daily at 8 pm. What changed:  when to take this   canagliflozin 100 MG Tabs tablet Commonly known as:  INVOKANA TAKE ONE (1) TABLET BY MOUTH EVERY DAY   clotrimazole 1 % cream Commonly known as:  LOTRIMIN APPLY TO AFFECTED AREAS TWICE DAILY What changed:  how much to take  how to take this  when to take this  additional instructions   diphenhydrAMINE 25 MG tablet Commonly known as:  BENADRYL Take 1 tablet (25 mg total) by mouth every 6 (six) hours as needed.   DULoxetine 30 MG capsule Commonly known as:  CYMBALTA Take 1 capsule (30 mg total) by mouth daily.   gabapentin 300 MG capsule Commonly known as:  NEURONTIN Take 1 capsule (300 mg total) by mouth 3 (three) times daily. What changed:  when to take this   glucose blood test strip Commonly known as:  ACCU-CHEK AVIVA PLUS Check blood sugar 3 times daily.   insulin aspart 100 UNIT/ML injection Commonly known as:  NOVOLOG SEE NOTES Dispense QH 1 month What changed:  how much to take  how to take this  when to take this  additional instructions   insulin glargine 100 UNIT/ML injection Commonly known as:  LANTUS Inject 0.2 mLs (20 Units total) into the skin at bedtime.   INSULIN SYRINGE 1CC/30GX5/16" 30G X 5/16" 1 ML Misc USE AS DIRECTED WITH LANTUS AND NOVOLOG INJECTIONS   levofloxacin 750 MG tablet Commonly known as:  LEVAQUIN Take 1 tablet (750 mg total) by mouth daily.   loteprednol 0.5 % ophthalmic suspension Commonly known as:  LOTEMAX Place 1 drop into both eyes 2 (two) times daily.   medroxyPROGESTERone 5 MG tablet Commonly known as:  PROVERA Take 1 tablet (5 mg total) by mouth 2 (two) times daily.   propranolol ER 60 MG 24 hr capsule Commonly known as:  INDERAL LA Take 1 capsule (60 mg total) by mouth daily.   saxagliptin HCl 2.5 MG Tabs  tablet Commonly known as:  ONGLYZA Take 2.5 mg by mouth 2 (two) times daily.   senna 8.6 MG Tabs tablet Commonly known as:  SENOKOT Take 2 tablets (17.2 mg total) by mouth at bedtime as needed (for constipation).   sulfamethoxazole-trimethoprim 800-160 MG tablet Commonly known as:  BACTRIM DS,SEPTRA DS Take 1 tablet by mouth every 12 (twelve) hours.   triamcinolone cream 0.1 % Commonly known as:  KENALOG Apply 1 application topically 2 (two) times daily. What changed:  when to take this  reasons to take this      Discharge Instructions: Please refer to Patient Instructions section of EMR for full details.  Patient was counseled important signs and symptoms that should prompt return to medical care, changes in medications, dietary  instructions, activity restrictions, and follow up appointments.   Follow-Up Appointments: Follow-up Information    JEGEDE, OLUGBEMIGA, MD. Schedule an appointment as soon as possible for a visit in 1 week.   Specialty:  Internal Medicine Why:  Hospital follow up Contact information: [place] [address] [phone]        Schedule an appointment as soon as possible for a visit with Newt Minion, MD.   Specialty:  Orthopedic Surgery Why:  Call the office and make an appointment for Tues, 8/22.  Contact information: [place] [address] [phone]           Everrett Coombe, MD 03/01/2016, 7:01 PM PGY-1, [place]

## 2016-02-28 LAB — CBC
HEMATOCRIT: 25.2 % — AB (ref 36.0–46.0)
HEMOGLOBIN: 8.3 g/dL — AB (ref 12.0–15.0)
MCH: 29 pg (ref 26.0–34.0)
MCHC: 32.9 g/dL (ref 30.0–36.0)
MCV: 88.1 fL (ref 78.0–100.0)
Platelets: 240 10*3/uL (ref 150–400)
RBC: 2.86 MIL/uL — ABNORMAL LOW (ref 3.87–5.11)
RDW: 15.6 % — AB (ref 11.5–15.5)
WBC: 12.7 10*3/uL — AB (ref 4.0–10.5)

## 2016-02-28 LAB — BASIC METABOLIC PANEL
ANION GAP: 10 (ref 5–15)
BUN: 20 mg/dL (ref 6–20)
CALCIUM: 8.3 mg/dL — AB (ref 8.9–10.3)
CO2: 22 mmol/L (ref 22–32)
Chloride: 107 mmol/L (ref 101–111)
Creatinine, Ser: 1.08 mg/dL — ABNORMAL HIGH (ref 0.44–1.00)
GFR, EST NON AFRICAN AMERICAN: 57 mL/min — AB (ref >60)
GLUCOSE: 115 mg/dL — AB (ref 65–99)
POTASSIUM: 5.3 mmol/L — AB (ref 3.5–5.1)
SODIUM: 139 mmol/L (ref 135–145)

## 2016-02-28 LAB — POTASSIUM: Potassium: 5.3 mmol/L — ABNORMAL HIGH (ref 3.5–5.1)

## 2016-02-28 LAB — GLUCOSE, CAPILLARY
GLUCOSE-CAPILLARY: 120 mg/dL — AB (ref 65–99)
GLUCOSE-CAPILLARY: 206 mg/dL — AB (ref 65–99)
Glucose-Capillary: 123 mg/dL — ABNORMAL HIGH (ref 65–99)

## 2016-02-28 MED ORDER — SULFAMETHOXAZOLE-TRIMETHOPRIM 800-160 MG PO TABS: 1.0000 | ORAL_TABLET | Freq: Two times a day (BID) | ORAL | 0 refills | Status: DC

## 2016-02-28 MED ORDER — MEDROXYPROGESTERONE ACETATE 5 MG PO TABS
5.0000 mg | ORAL_TABLET | Freq: Two times a day (BID) | ORAL | 0 refills | Status: DC
Start: 2016-02-28 — End: 2016-03-02

## 2016-02-28 NOTE — Progress Notes (Signed)
Family Medicine Teaching Service Daily Progress Note Intern Pager: [phone]  Patient name: Debbie [name] Medical record number: [id number] Date of birth: 21-May-1961 Age: 55 y.o. 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   # Vaginal Bleeding-  Improved. Hgb 8.3 (stable). TSH 0.88.   Patient asymptomatic. - phone c/s GYN: Megace 40 mg PO BID. Bleeding better so will transition to Provera, with Gyn follow up outpatient.  Will need endometrial bioppsy as outpatient.  Patient wanting hysterectomy. -  Continue provera 5 mg qD on discharge  (started 8/19) - sanitary pad counts; 3 pads all day yesterday with no clots, 2 pads o/n no clots per nursing. - zofran PRN nausea - Will obtain hgb if bleeding increases.  # Decreased UOP. Per RN, however seems there was some question about UOP measurements. Patient bladder scanned with 129 cc, no I/O cath indicated - monitor - consider I/O cath if no UOP and >200cc urinary retention on bladder scan  # Syncope- likely due to acute blood loss with subsequent hypotension and anemia. CT head and c spine negative for acute process - tylenol PRN pain  # L great toe cellulitis:Previously admitted on 7/25 for concern for left toe osteomyelitis. She refused surgical management as well as PICC for prolonged IV antibiotics. She was discharged on oral levaquin and bactrim. No fevers at home, improving pain, WBC today stab;e at 12/7. - Continue home levaquin, bactrim  - Dr Sharol Given to see outpatient.  Will have patient call to make an appointmt with him for Monday or tuesday   # Type II [id number]  qHS, invokana, Onglyza at home A1c 6.4 on 02/12/2016 - Lantus 10 U qD and titrate up as needed - SSI - Continue gabapentin - CBG ACHS   # Peripheral arterial disease:Followed by Dr. Bridgett Larsson with vascular surgery. On ASA and Plavix at home.  - hold home Plavix and ASA in setting of bleeding - continue atovastatin   # [id number] in the ED to 90s-low 100s/50s On propranolol at home.  - Holdpropranolol for now   # Renal-Cr 1.2>1.08. This most recent Cr elevation was from a baseline of 0.9 and thought to be due to antibiotic use - Obtain am BMP - potassium 5.3, sample hemolyzed. Ordered repeat potassium will F/U  # Depression/anxiety: Reportedly on both Cymbalta and Zoloft at home.  - Continue Zoloft - Will not continue Cymbalta as duplicate therapy with SSRI and SNRI  FEN/GI:carb modified heart healthy diet, SLIV, Senna Prophylaxis: SCDs (active bleed)  Disposition: Discharge home today if bleeding improved on Provera, close follow up with ortho and family medicine this week  Subjective:  Patient rests comfortably in bed, states she has no pain, no light-headedness, no cramping.  States bleeding is improved.  Translator was used.  Objective: Temp:  [98.4 F (36.9 C)-99.3 F (37.4 C)] 98.4 F (36.9 C) (08/20 0802) Pulse Rate:  [82-90] 86 (08/20 0802) Resp:  [17-18] 18 (08/20 0802) BP: (101-127)/(33-53) 101/33 (08/20 0802) SpO2:  [96 %-99 %] 96 % (08/20 0802) Weight:  [230 lb (104.3 kg)] 230 lb (104.3 kg) (08/19 2038) Physical Exam: General: NAD, rests comfortably in bed Cardiovascular: RRR, no m/r/g Respiratory: CTA bil, no  W/R/R Abdomen: soft, nontender, nondistended, normoactive BS Extremities: +great L toe cellulitis with apparent eschar, otherwise no LE edema bilaterally  Laboratory:  Recent Labs Lab 02/26/16 0631 02/26/16 1702 02/27/16 0358 02/28/16 0509  WBC 13.7*  --  12.8* 12.7*  HGB 8.8* 8.0* 8.2* 8.3*  HCT 25.7* 24.6* 24.9* 25.2*  PLT 189  --   210 240    Recent Labs Lab 02/26/16 0631 02/27/16 0840 02/28/16 0509  NA 139 136 139  K 4.4 4.5 5.3*  CL 107 108 107  CO2 24 24 22   BUN 20 24* 20  CREATININE 1.07* 1.21* 1.08*  CALCIUM 8.2* 8.1* 8.3*  GLUCOSE 145* 134* 115*      Imaging/Diagnostic Tests: No results found.   Everrett Coombe, MD 02/28/2016, 8:49 AM PGY-1, [place] Intern pager: [phone], text pages welcome

## 2016-02-28 NOTE — Progress Notes (Signed)
3 pads used the whole shift, moderate bleeding with no clots. Will continue to monitor.

## 2016-02-29 LAB — TYPE AND SCREEN
ABO/RH(D): O POS
Antibody Screen: NEGATIVE
UNIT DIVISION: 0
Unit division: 0
Unit division: 0

## 2016-02-29 NOTE — Telephone Encounter (Signed)
Voicemail states to give a call back to [place] with [facility] at [phone]. Medical Assistant left message on patient's home and cell voicemail.

## 2016-03-01 ENCOUNTER — Encounter (HOSPITAL_COMMUNITY): Payer: Self-pay | Admitting: Emergency Medicine

## 2016-03-01 DIAGNOSIS — Z794 Long term (current) use of insulin: Secondary | ICD-10-CM | POA: Diagnosis not present

## 2016-03-01 DIAGNOSIS — E11319 Type 2 diabetes mellitus with unspecified diabetic retinopathy without macular edema: Secondary | ICD-10-CM | POA: Diagnosis not present

## 2016-03-01 DIAGNOSIS — Z7982 Long term (current) use of aspirin: Secondary | ICD-10-CM | POA: Diagnosis not present

## 2016-03-01 DIAGNOSIS — E039 Hypothyroidism, unspecified: Secondary | ICD-10-CM | POA: Diagnosis not present

## 2016-03-01 DIAGNOSIS — E1140 Type 2 diabetes mellitus with diabetic neuropathy, unspecified: Secondary | ICD-10-CM | POA: Diagnosis not present

## 2016-03-01 DIAGNOSIS — D259 Leiomyoma of uterus, unspecified: Secondary | ICD-10-CM | POA: Diagnosis not present

## 2016-03-01 DIAGNOSIS — I10 Essential (primary) hypertension: Secondary | ICD-10-CM | POA: Insufficient documentation

## 2016-03-01 DIAGNOSIS — Z7984 Long term (current) use of oral hypoglycemic drugs: Secondary | ICD-10-CM | POA: Diagnosis not present

## 2016-03-01 DIAGNOSIS — N939 Abnormal uterine and vaginal bleeding, unspecified: Secondary | ICD-10-CM | POA: Diagnosis present

## 2016-03-01 LAB — CBC
HEMATOCRIT: 27.4 % — AB (ref 36.0–46.0)
Hemoglobin: 8.6 g/dL — ABNORMAL LOW (ref 12.0–15.0)
MCH: 28 pg (ref 26.0–34.0)
MCHC: 31.4 g/dL (ref 30.0–36.0)
MCV: 89.3 fL (ref 78.0–100.0)
Platelets: 270 10*3/uL (ref 150–400)
RBC: 3.07 MIL/uL — AB (ref 3.87–5.11)
RDW: 15.7 % — AB (ref 11.5–15.5)
WBC: 11.7 10*3/uL — AB (ref 4.0–10.5)

## 2016-03-01 LAB — COMPREHENSIVE METABOLIC PANEL
ALT: 33 U/L (ref 14–54)
AST: 28 U/L (ref 15–41)
Albumin: 3 g/dL — ABNORMAL LOW (ref 3.5–5.0)
Alkaline Phosphatase: 76 U/L (ref 38–126)
Anion gap: 4 — ABNORMAL LOW (ref 5–15)
BUN: 15 mg/dL (ref 6–20)
CHLORIDE: 108 mmol/L (ref 101–111)
CO2: 22 mmol/L (ref 22–32)
Calcium: 8.5 mg/dL — ABNORMAL LOW (ref 8.9–10.3)
Creatinine, Ser: 0.94 mg/dL (ref 0.44–1.00)
GFR calc Af Amer: >60 mL/min (ref >60)
Glucose, Bld: 182 mg/dL — ABNORMAL HIGH (ref 65–99)
POTASSIUM: 4.5 mmol/L (ref 3.5–5.1)
SODIUM: 134 mmol/L — AB (ref 135–145)
Total Bilirubin: 0.2 mg/dL — ABNORMAL LOW (ref 0.3–1.2)
Total Protein: 5.9 g/dL — ABNORMAL LOW (ref 6.5–8.1)

## 2016-03-01 LAB — TYPE AND SCREEN
ABO/RH(D): O POS
Antibody Screen: NEGATIVE

## 2016-03-01 NOTE — Telephone Encounter (Signed)
Debbie [name] spoke with Dr. Doreene Burke. Patient has appointment with PCP on 03/02/16. No further questions at this time.

## 2016-03-01 NOTE — Telephone Encounter (Signed)
Debbie [name] called to let pt. PCP know that her L great toe has turned necrotic since last week. Please f/u

## 2016-03-01 NOTE — Telephone Encounter (Signed)
Debbie [name] from [place] returned call. Please f/u.

## 2016-03-01 NOTE — ED Triage Notes (Signed)
Pt here for low hgb per family; pt sent from PCP

## 2016-03-02 ENCOUNTER — Telehealth: Payer: Self-pay

## 2016-03-02 ENCOUNTER — Emergency Department (HOSPITAL_COMMUNITY)
Admission: EM | Admit: 2016-03-02 | Discharge: 2016-03-02 | Disposition: A | Discharge status: Home / Self Care | Payer: Medicaid Other | Attending: Emergency Medicine | Admitting: Emergency Medicine

## 2016-03-02 ENCOUNTER — Encounter: Payer: Self-pay | Admitting: Internal Medicine

## 2016-03-02 ENCOUNTER — Ambulatory Visit: Payer: Medicaid Other | Attending: Internal Medicine | Admitting: Internal Medicine

## 2016-03-02 VITALS — BP 118/69 | HR 99 | Temp 98.3°F | Resp 16 | Ht 65.0 in

## 2016-03-02 DIAGNOSIS — L089 Local infection of the skin and subcutaneous tissue, unspecified: Secondary | ICD-10-CM

## 2016-03-02 DIAGNOSIS — E1169 Type 2 diabetes mellitus with other specified complication: Secondary | ICD-10-CM | POA: Diagnosis not present

## 2016-03-02 DIAGNOSIS — N939 Abnormal uterine and vaginal bleeding, unspecified: Secondary | ICD-10-CM

## 2016-03-02 DIAGNOSIS — E11628 Type 2 diabetes mellitus with other skin complications: Secondary | ICD-10-CM

## 2016-03-02 DIAGNOSIS — N950 Postmenopausal bleeding: Secondary | ICD-10-CM | POA: Insufficient documentation

## 2016-03-02 DIAGNOSIS — E1165 Type 2 diabetes mellitus with hyperglycemia: Secondary | ICD-10-CM

## 2016-03-02 DIAGNOSIS — I10 Essential (primary) hypertension: Secondary | ICD-10-CM | POA: Diagnosis not present

## 2016-03-02 DIAGNOSIS — D259 Leiomyoma of uterus, unspecified: Secondary | ICD-10-CM

## 2016-03-02 MED ORDER — MEGESTROL ACETATE 40 MG PO TABS: 40.0000 mg | ORAL_TABLET | Freq: Two times a day (BID) | ORAL | 1 refills | Status: DC

## 2016-03-02 MED ORDER — MEGESTROL ACETATE 40 MG PO TABS
40.0000 mg | ORAL_TABLET | Freq: Every day | ORAL | Status: DC
  Administered 2016-03-02: 40 mg via ORAL
  Filled 2016-03-02: qty 1

## 2016-03-02 MED ORDER — FERROUS SULFATE 325 (65 FE) MG PO TABS: 325.0000 mg | ORAL_TABLET | Freq: Two times a day (BID) | ORAL | 1 refills | Status: DC

## 2016-03-02 NOTE — ED Provider Notes (Signed)
[place] DEPT Provider Note   CSN: [id number] Arrival date & time: 03/01/16  1831  By signing my name below, I, Higinio Plan, attest that this documentation has been prepared under the direction and in the presence of Orpah Greek, MD . Electronically Signed: Higinio Plan, Scribe. 03/02/2016. 1:08 AM.  History   Chief Complaint Chief Complaint  Patient presents with  . Abnormal Lab   The history is provided by the patient and a relative. No language interpreter was used.   HPI Comments: Debbie [name] is a 55 y.o. female with PMHx of HTN, hypothyroidism and Type II DM, who presents to the Emergency Department for an evaluation of low hemoglobin levels by her PCP. Per sister, pt was recently admitted to [facility] for anemia related to vaginal bleeding and was given a blood transfusion and told to follow up with her PCP. Pt's sister reports she was called by her PCP at 5:30 PM today due to low hemoglobin levels and was advised to visit the ED. Pt' sister states pt is still currently bleeding but not as much as before; she has to change her diaper and pad every 3 hours. Pt states associated headache and right groin pain that began at the onset of her vaginal bleeding. She denies chest pain, difficulty breathing and dizziness. Per sister, pt does not have regular menstrual periods. She reports pt has an appointment with her PCP tomorrow morning.   Past Medical History:  Diagnosis Date  . Blind   . Cataract   . Chest pain    a. ? MI in 2010, [place]->medically managed  . Diabetic foot ulcer ([place])   . Hypertension    x ~ 10 years  . Hypothyroidism   . Obesity   . Type II or unspecified type diabetes mellitus with unspecified complication, uncontrolled    x ~ 10 years    Patient Active Problem List   Diagnosis Date Noted  . AKI (acute kidney injury) ([place]) 02/27/2016  . Acute blood loss anemia   . Cellulitis of left toe   . Language barrier affecting health care   . Vaginal bleeding  02/25/2016  . Pain and swelling of toe of left foot   . Cellulitis of toe of left foot   . Atherosclerotic peripheral vascular disease with rest pain ([place]) 01/22/2016  . Poorly controlled type 2 diabetes mellitus ([place] [address]) 11/05/2015  . PAD (peripheral artery disease) ([place]) 11/05/2015  . Hypoglycemia 04/13/2015  . Chronic pain syndrome 02/05/2015  . Fall at home 02/05/2015  . PTSD (post-traumatic stress disorder) 01/09/2014  . Fungal dermatitis 01/09/2014  . Furuncle 08/02/2013  . Uncontrolled diabetes mellitus ([place]) 08/02/2013  . Generalized anxiety disorder 12/13/2012  . Polyneuropathy in diabetes(357.2) 12/13/2012  . Diabetic retinopathy ([place]) 12/12/2012  . Diabetic foot ulcers ([place]) 12/05/2012  . Diabetic foot infection ([place]) 11/30/2012  . Unspecified hypothyroidism 11/30/2012  . Adjustment disorder with mixed anxiety and depressed mood 11/10/2012  . Diabetes mellitus type 2 with peripheral artery disease ([place]) 11/09/2012  . HTN (hypertension) 11/09/2012    Past Surgical History:  Procedure Laterality Date  . BACK SURGERY    . PERIPHERAL VASCULAR CATHETERIZATION N/A 02/01/2016   Procedure: Abdominal Aortogram w/Lower Extremity;  Surgeon: Conrad , MD;  Location: [facility];  Service: Cardiovascular;  Laterality: N/A;  . TUBAL LIGATION      OB History    No data available       Home Medications    Prior to Admission medications  Medication Sig Start Date End Date Taking? Authorizing Provider  ACCU-CHEK SOFTCLIX LANCETS lancets  11/17/15   Historical Provider, MD  aspirin EC 81 MG EC tablet Take 1 tablet (81 mg total) by mouth daily. 02/05/16   Elsia Nigel Sloop, DO  atorvastatin (LIPITOR) 40 MG tablet Take 1 tablet (40 mg total) by mouth daily at 8 pm. Patient taking differently: Take 40 mg by mouth every morning.  02/11/16   Tresa Garter, MD  canagliflozin (INVOKANA) 100 MG TABS tablet TAKE ONE (1) TABLET BY MOUTH EVERY DAY 02/25/16   Tresa Garter, MD    clotrimazole (LOTRIMIN) 1 % cream APPLY TO AFFECTED AREAS TWICE DAILY Patient taking differently: Apply 1 application topically 2 (two) times daily.  11/10/15   Tresa Garter, MD  diphenhydrAMINE (BENADRYL) 25 MG tablet Take 1 tablet (25 mg total) by mouth every 6 (six) hours as needed. Patient not taking: Reported on 02/25/2016 02/11/16   Tresa Garter, MD  DULoxetine (CYMBALTA) 30 MG capsule Take 1 capsule (30 mg total) by mouth daily. 01/22/16   Conrad [place], MD  gabapentin (NEURONTIN) 300 MG capsule Take 1 capsule (300 mg total) by mouth 3 (three) times daily. Patient taking differently: Take 300 mg by mouth 2 (two) times daily.  12/24/15   Tresa Garter, MD  glucose blood (ACCU-CHEK AVIVA PLUS) test strip Check blood sugar 3 times daily. 01/28/16   Tresa Garter, MD  insulin aspart (NOVOLOG) 100 UNIT/ML injection SEE NOTES Dispense QH 1 month Patient taking differently: Inject 10 Units into the skin 3 (three) times daily with meals. Three times daily per Sliding scale: 121-150, 2 units : 151-200, 3 units : 201-250, 5 units : 251-300 8 units : 301-350, 11 units : 351-400, 15 units 07/23/15   Willeen Niece, MD  insulin glargine (LANTUS) 100 UNIT/ML injection Inject 0.2 mLs (20 Units total) into the skin at bedtime. 12/31/15   Tresa Garter, MD  Insulin Syringe-Needle U-100 (INSULIN SYRINGE 1CC/30GX5/16") 30G X 5/16" 1 ML MISC USE AS DIRECTED WITH LANTUS AND NOVOLOG INJECTIONS 10/08/15   Tresa Garter, MD  Lancets (ACCU-CHEK SOFT TOUCH) lancets Use as instructed 12/31/15   Tresa Garter, MD  levofloxacin (LEVAQUIN) 750 MG tablet Take 1 tablet (750 mg total) by mouth daily. 02/05/16   Elsia Nigel Sloop, DO  loteprednol (LOTEMAX) 0.5 % ophthalmic suspension Place 1 drop into both eyes 2 (two) times daily. 01/09/14   Tresa Garter, MD  medroxyPROGESTERone (PROVERA) 5 MG tablet Take 1 tablet (5 mg total) by mouth 2 (two) times daily. 02/28/16 03/07/16  Everrett Coombe, MD   propranolol ER (INDERAL LA) 60 MG 24 hr capsule Take 1 capsule (60 mg total) by mouth daily. 07/23/15   Willeen Niece, MD  saxagliptin HCl (ONGLYZA) 2.5 MG TABS tablet Take 2.5 mg by mouth 2 (two) times daily.     Historical Provider, MD  senna (SENOKOT) 8.6 MG TABS tablet Take 2 tablets (17.2 mg total) by mouth at bedtime as needed (for constipation). 07/23/15   Willeen Niece, MD  sulfamethoxazole-trimethoprim (BACTRIM DS,SEPTRA DS) 800-160 MG tablet Take 1 tablet by mouth every 12 (twelve) hours. 02/28/16   Everrett Coombe, MD  triamcinolone cream (KENALOG) 0.1 % Apply 1 application topically 2 (two) times daily. Patient taking differently: Apply 1 application topically daily as needed (for itching).  02/11/16   Tresa Garter, MD    Family History Family History  Problem Relation Age of  Onset  . Diabetes Father     died in his 50's?/died in her 54's?  . Diabetes Sister     alive and well.    Social History Social History  Substance Use Topics  . Smoking status: Never Smoker  . Smokeless tobacco: Never Used  . Alcohol use Not on file     Allergies   Penicillins and Hydrocortisone   Review of Systems Review of Systems  Respiratory: Negative for shortness of breath.   Cardiovascular: Negative for chest pain.  Musculoskeletal: Positive for myalgias.  Neurological: Positive for headaches. Negative for dizziness.   Physical Exam Updated Vital Signs BP 143/55 (BP Location: Right Arm)   Pulse 98   Temp 98.9 F (37.2 C) (Oral)   Resp 18   LMP 05/21/2013   SpO2 100%   Physical Exam  Constitutional: She is oriented to person, place, and time. She appears well-developed and well-nourished. No distress.  HENT:  Head: Normocephalic and atraumatic.  Right Ear: Hearing normal.  Left Ear: Hearing normal.  Nose: Nose normal.  Mouth/Throat: Oropharynx is clear and moist and mucous membranes are normal.  Eyes: Conjunctivae and EOM are normal. Pupils are equal, round, and reactive  to light.  Neck: Normal range of motion. Neck supple.  Cardiovascular: Regular rhythm, S1 normal and S2 normal.  Exam reveals no gallop and no friction rub.   No murmur heard. Pulmonary/Chest: Effort normal and breath sounds normal. No respiratory distress. She exhibits no tenderness.  Abdominal: Soft. Normal appearance and bowel sounds are normal. There is no hepatosplenomegaly. There is no tenderness. There is no rebound, no guarding, no tenderness at McBurney's point and negative Murphy's sign. No hernia.  Musculoskeletal: Normal range of motion.  Neurological: She is alert and oriented to person, place, and time. She has normal strength. No cranial nerve deficit or sensory deficit. Coordination normal. GCS eye subscore is 4. GCS verbal subscore is 5. GCS motor subscore is 6.  Skin: Skin is warm, dry and intact. No rash noted. No cyanosis.  Psychiatric: She has a normal mood and affect. Her speech is normal and behavior is normal. Thought content normal.  Nursing note and vitals reviewed.  ED Treatments / Results  Labs (all labs ordered are listed, but only abnormal results are displayed) Labs Reviewed  COMPREHENSIVE METABOLIC PANEL - Abnormal; Notable for the following:       Result Value   Sodium 134 (*)    Glucose, Bld 182 (*)    Calcium 8.5 (*)    Total Protein 5.9 (*)    Albumin 3.0 (*)    Total Bilirubin 0.2 (*)    Anion gap 4 (*)    All other components within normal limits  CBC - Abnormal; Notable for the following:    WBC 11.7 (*)    RBC 3.07 (*)    Hemoglobin 8.6 (*)    HCT 27.4 (*)    RDW 15.7 (*)    All other components within normal limits  TYPE AND SCREEN    EKG  EKG Interpretation None       Radiology No results found.  Procedures Procedures  DIAGNOSTIC STUDIES:  Oxygen Saturation is 100% on RA, normal by my interpretation.    COORDINATION OF CARE:  1:11 AM Discussed treatment plan with pt at bedside and pt agreed to plan.  Medications Ordered  in ED Medications  megestrol (MEGACE) tablet 40 mg (not administered)    Initial Impression / Assessment and Plan / ED Course  I have reviewed the triage vital signs and the nursing notes.  Pertinent labs & imaging results that were available during my care of the patient were reviewed by me and considered in my medical decision making (see chart for details).  Clinical Course    Patient told to come back to the ER after post hospitalization blood work revealed anemia. Patient was hospitalized for abnormal uterine bleeding, felt to be secondary to uterine fibroids. Hemoglobin was 6.5 at time of admission. She was given 2 units of blood showing her hospitalization and treated with Megace followed by Provera.  Patient continues to have bleeding at home. She is not having dizziness, syncope, chest pain, shortness of breath. Hemoglobin is 8.6. This is actually higher than it was when she was discharged from the hospital. She is not currently on iron supplementation and is still having some bleeding, therefore does not surprising that her hemoglobin has not normalized. She has not, however, dropping. She does not require any further transfusion.  Stress with Dr. Kennon Rounds, on-call for OB/GYN. Recommend changing back to Megace twice a day. Continue until bleeding stops, then after 1 week drop to once a day. Will be seen in the office for follow-up. Patient will be started on iron supplementation as well.  I personally performed the services described in this documentation, which was scribed in my presence. The recorded information has been reviewed and is accurate.   Final Clinical Impressions(s) / ED Diagnoses   Final diagnoses:  Abnormal uterine bleeding (AUB)  Uterine leiomyoma, unspecified location    New Prescriptions New Prescriptions   No medications on file     Orpah Greek, MD 03/02/16 [id number]

## 2016-03-02 NOTE — Patient Instructions (Addendum)
Diabetes and Foot Care Diabetes may cause you to have problems because of poor blood supply (circulation) to your feet and legs. This may cause the skin on your feet to become thinner, break easier, and heal more slowly. Your skin may become dry, and the skin may peel and crack. You may also have nerve damage in your legs and feet causing decreased feeling in them. You may not notice minor injuries to your feet that could lead to infections or more serious problems. Taking care of your feet is one of the most important things you can do for yourself.  HOME CARE INSTRUCTIONS  Wear shoes at all times, even in the house. Do not go barefoot. Bare feet are easily injured.  Check your feet daily for blisters, cuts, and redness. If you cannot see the bottom of your feet, use a mirror or ask someone for help.  Wash your feet with warm water (do not use hot water) and mild soap. Then pat your feet and the areas between your toes until they are completely dry. Do not soak your feet as this can dry your skin.  Apply a moisturizing lotion or petroleum jelly (that does not contain alcohol and is unscented) to the skin on your feet and to dry, brittle toenails. Do not apply lotion between your toes.  Trim your toenails straight across. Do not dig under them or around the cuticle. File the edges of your nails with an emery board or nail file.  Do not cut corns or calluses or try to remove them with medicine.  Wear clean socks or stockings every day. Make sure they are not too tight. Do not wear knee-high stockings since they may decrease blood flow to your legs.  Wear shoes that fit properly and have enough cushioning. To break in new shoes, wear them for just a few hours a day. This prevents you from injuring your feet. Always look in your shoes before you put them on to be sure there are no objects inside.  Do not cross your legs. This may decrease the blood flow to your feet.  If you find a minor scrape,  cut, or break in the skin on your feet, keep it and the skin around it clean and dry. These areas may be cleansed with mild soap and water. Do not cleanse the area with peroxide, alcohol, or iodine.  When you remove an adhesive bandage, be sure not to damage the skin around it.  If you have a wound, look at it several times a day to make sure it is healing.  Do not use heating pads or hot water bottles. They may burn your skin. If you have lost feeling in your feet or legs, you may not know it is happening until it is too late.  Make sure your health care provider performs a complete foot exam at least annually or more often if you have foot problems. Report any cuts, sores, or bruises to your health care provider immediately. SEEK MEDICAL CARE IF:   You have an injury that is not healing.  You have cuts or breaks in the skin.  You have an ingrown nail.  You notice redness on your legs or feet.  You feel burning or tingling in your legs or feet.  You have pain or cramps in your legs and feet.  Your legs or feet are numb.  Your feet always feel cold. SEEK IMMEDIATE MEDICAL CARE IF:   There is increasing redness,   swelling, or pain in or around a wound.  There is a red line that goes up your leg.  Pus is coming from a wound.  You develop a fever or as directed by your health care provider.  You notice a bad smell coming from an ulcer or wound.   This information is not intended to replace advice given to you by your health care provider. Make sure you discuss any questions you have with your health care provider.   Document Released: 06/24/2000 Document Revised: 02/27/2013 Document Reviewed: 12/04/2012 Elsevier Interactive Patient Education 2016 [employer] Basic Carbohydrate Counting for Diabetes Mellitus Carbohydrate counting is a method for keeping track of the amount of carbohydrates you eat. Eating carbohydrates naturally increases the level of sugar (glucose) in your  blood, so it is important for you to know the amount that is okay for you to have in every meal. Carbohydrate counting helps keep the level of glucose in your blood within normal limits. The amount of carbohydrates allowed is different for every person. A dietitian can help you calculate the amount that is right for you. Once you know the amount of carbohydrates you can have, you can count the carbohydrates in the foods you want to eat. Carbohydrates are found in the following foods:  Grains, such as breads and cereals.  Dried beans and soy products.  Starchy vegetables, such as potatoes, peas, and corn.  Fruit and fruit juices.  Milk and yogurt.  Sweets and snack foods, such as cake, cookies, candy, chips, soft drinks, and fruit drinks. CARBOHYDRATE COUNTING There are two ways to count the carbohydrates in your food. You can use either of the methods or a combination of both. Reading the "Nutrition Facts" on Packaged Food The "Nutrition Facts" is an area that is included on the labels of almost all packaged food and beverages in the [place]. It includes the serving size of that food or beverage and information about the nutrients in each serving of the food, including the grams (g) of carbohydrate per serving.  Decide the number of servings of this food or beverage that you will be able to eat or drink. Multiply that number of servings by the number of grams of carbohydrate that is listed on the label for that serving. The total will be the amount of carbohydrates you will be having when you eat or drink this food or beverage. Learning Standard Serving Sizes of Food When you eat food that is not packaged or does not include "Nutrition Facts" on the label, you need to measure the servings in order to count the amount of carbohydrates.A serving of most carbohydrate-rich foods contains about 15 g of carbohydrates. The following list includes serving sizes of carbohydrate-rich foods that  provide 15 g ofcarbohydrate per serving:   1 slice of bread (1 oz) or 1 six-inch tortilla.    of a hamburger bun or English muffin.  4-6 crackers.   cup unsweetened dry cereal.    cup hot cereal.   cup rice or pasta.    cup mashed potatoes or  of a large baked potato.  1 cup fresh fruit or one small piece of fruit.    cup canned or frozen fruit or fruit juice.  1 cup milk.   cup plain fat-free yogurt or yogurt sweetened with artificial sweeteners.   cup cooked dried beans or starchy vegetable, such as peas, corn, or potatoes.  Decide the number of standard-size servings that you will eat. Multiply that   number of servings by 15 (the grams of carbohydrates in that serving). For example, if you eat 2 cups of strawberries, you will have eaten 2 servings and 30 g of carbohydrates (2 servings x 15 g = 30 g). For foods such as soups and casseroles, in which more than one food is mixed in, you will need to count the carbohydrates in each food that is included. EXAMPLE OF CARBOHYDRATE COUNTING Sample Dinner  3 oz chicken breast.   cup of brown rice.   cup of corn.  1 cup milk.   1 cup strawberries with sugar-free whipped topping.  Carbohydrate Calculation Step 1: Identify the foods that contain carbohydrates:   Rice.   Corn.   Milk.   Strawberries. Step 2:Calculate the number of servings eaten of each:   2 servings of rice.   1 serving of corn.   1 serving of milk.   1 serving of strawberries. Step 3: Multiply each of those number of servings by 15 g:   2 servings of rice x 15 g = 30 g.   1 serving of corn x 15 g = 15 g.   1 serving of milk x 15 g = 15 g.   1 serving of strawberries x 15 g = 15 g. Step 4: Add together all of the amounts to find the total grams of carbohydrates eaten: 30 g + 15 g + 15 g + 15 g = 75 g.   This information is not intended to replace advice given to you by your health care provider. Make sure you  discuss any questions you have with your health care provider.   Document Released: 06/27/2005 Document Revised: 07/18/2014 Document Reviewed: 05/24/2013 Elsevier Interactive Patient Education 2016 [place]. Diabetes and Foot Care Diabetes may cause you to have problems because of poor blood supply (circulation) to your feet and legs. This may cause the skin on your feet to become thinner, break easier, and heal more slowly. Your skin may become dry, and the skin may peel and crack. You may also have nerve damage in your legs and feet causing decreased feeling in them. You may not notice minor injuries to your feet that could lead to infections or more serious problems. Taking care of your feet is one of the most important things you can do for yourself.  HOME CARE INSTRUCTIONS  Wear shoes at all times, even in the house. Do not go barefoot. Bare feet are easily injured.  Check your feet daily for blisters, cuts, and redness. If you cannot see the bottom of your feet, use a mirror or ask someone for help.  Wash your feet with warm water (do not use hot water) and mild soap. Then pat your feet and the areas between your toes until they are completely dry. Do not soak your feet as this can dry your skin.  Apply a moisturizing lotion or petroleum jelly (that does not contain alcohol and is unscented) to the skin on your feet and to dry, brittle toenails. Do not apply lotion between your toes.  Trim your toenails straight across. Do not dig under them or around the cuticle. File the edges of your nails with an emery board or nail file.  Do not cut corns or calluses or try to remove them with medicine.  Wear clean socks or stockings every day. Make sure they are not too tight. Do not wear knee-high stockings since they may decrease blood flow to your legs.  Wear  shoes that fit properly and have enough cushioning. To break in new shoes, wear them for just a few hours a day. This prevents you  from injuring your feet. Always look in your shoes before you put them on to be sure there are no objects inside.  Do not cross your legs. This may decrease the blood flow to your feet.  If you find a minor scrape, cut, or break in the skin on your feet, keep it and the skin around it clean and dry. These areas may be cleansed with mild soap and water. Do not cleanse the area with peroxide, alcohol, or iodine.  When you remove an adhesive bandage, be sure not to damage the skin around it.  If you have a wound, look at it several times a day to make sure it is healing.  Do not use heating pads or hot water bottles. They may burn your skin. If you have lost feeling in your feet or legs, you may not know it is happening until it is too late.  Make sure your health care provider performs a complete foot exam at least annually or more often if you have foot problems. Report any cuts, sores, or bruises to your health care provider immediately. SEEK MEDICAL CARE IF:   You have an injury that is not healing.  You have cuts or breaks in the skin.  You have an ingrown nail.  You notice redness on your legs or feet.  You feel burning or tingling in your legs or feet.  You have pain or cramps in your legs and feet.  Your legs or feet are numb.  Your feet always feel cold. SEEK IMMEDIATE MEDICAL CARE IF:   There is increasing redness, swelling, or pain in or around a wound.  There is a red line that goes up your leg.  Pus is coming from a wound.  You develop a fever or as directed by your health care provider.  You notice a bad smell coming from an ulcer or wound.   This information is not intended to replace advice given to you by your health care provider. Make sure you discuss any questions you have with your health care provider.   Document Released: 06/24/2000 Document Revised: 02/27/2013 Document Reviewed: 12/04/2012 Elsevier Interactive Patient Education 2016 [place] Carbohydrate Counting for Diabetes Mellitus Carbohydrate counting is a method for keeping track of the amount of carbohydrates you eat. Eating carbohydrates naturally increases the level of sugar (glucose) in your blood, so it is important for you to know the amount that is okay for you to have in every meal. Carbohydrate counting helps keep the level of glucose in your blood within normal limits. The amount of carbohydrates allowed is different for every person. A dietitian can help you calculate the amount that is right for you. Once you know the amount of carbohydrates you can have, you can count the carbohydrates in the foods you want to eat. Carbohydrates are found in the following foods:  Grains, such as breads and cereals.  Dried beans and soy products.  Starchy vegetables, such as potatoes, peas, and corn.  Fruit and fruit juices.  Milk and yogurt.  Sweets and snack foods, such as cake, cookies, candy, chips, soft drinks, and fruit drinks. CARBOHYDRATE COUNTING There are two ways to count the carbohydrates in your food. You can use either of the methods or a combination of both. Reading the "Nutrition Facts" on [place] The "  Nutrition Facts" is an area that is included on the labels of almost all packaged food and beverages in the [place]. It includes the serving size of that food or beverage and information about the nutrients in each serving of the food, including the grams (g) of carbohydrate per serving.  Decide the number of servings of this food or beverage that you will be able to eat or drink. Multiply that number of servings by the number of grams of carbohydrate that is listed on the label for that serving. The total will be the amount of carbohydrates you will be having when you eat or drink this food or beverage. Learning Standard Serving Sizes of Food When you eat food that is not packaged or does not include "Nutrition Facts" on the label, you need to  measure the servings in order to count the amount of carbohydrates.A serving of most carbohydrate-rich foods contains about 15 g of carbohydrates. The following list includes serving sizes of carbohydrate-rich foods that provide 15 g ofcarbohydrate per serving:   1 slice of bread (1 oz) or 1 six-inch tortilla.    of a hamburger bun or English muffin.  4-6 crackers.   cup unsweetened dry cereal.    cup hot cereal.   cup rice or pasta.    cup mashed potatoes or  of a large baked potato.  1 cup fresh fruit or one small piece of fruit.    cup canned or frozen fruit or fruit juice.  1 cup milk.   cup plain fat-free yogurt or yogurt sweetened with artificial sweeteners.   cup cooked dried beans or starchy vegetable, such as peas, corn, or potatoes.  Decide the number of standard-size servings that you will eat. Multiply that number of servings by 15 (the grams of carbohydrates in that serving). For example, if you eat 2 cups of strawberries, you will have eaten 2 servings and 30 g of carbohydrates (2 servings x 15 g = 30 g). For foods such as soups and casseroles, in which more than one food is mixed in, you will need to count the carbohydrates in each food that is included. EXAMPLE OF CARBOHYDRATE COUNTING Sample Dinner  3 oz chicken breast.   cup of brown rice.   cup of corn.  1 cup milk.   1 cup strawberries with sugar-free whipped topping.  Carbohydrate Calculation Step 1: Identify the foods that contain carbohydrates:   Rice.   Corn.   Milk.   Strawberries. Step 2:Calculate the number of servings eaten of each:   2 servings of rice.   1 serving of corn.   1 serving of milk.   1 serving of strawberries. Step 3: Multiply each of those number of servings by 15 g:   2 servings of rice x 15 g = 30 g.   1 serving of corn x 15 g = 15 g.   1 serving of milk x 15 g = 15 g.   1 serving of strawberries x 15 g = 15 g. Step 4: Add  together all of the amounts to find the total grams of carbohydrates eaten: 30 g + 15 g + 15 g + 15 g = 75 g.   This information is not intended to replace advice given to you by your health care provider. Make sure you discuss any questions you have with your health care provider.   Document Released: 06/27/2005 Document Revised: 07/18/2014 Document Reviewed: 05/24/2013 Elsevier Interactive Patient Education [employer].

## 2016-03-02 NOTE — Progress Notes (Signed)
Debbie [name], is a 55 y.o. female  [id number]  [id number]  DOB - 18-Nov-1960  Chief Complaint  Patient presents with  . Hospitalization Follow-up      Subjective:   Debbie [name] is a 55 y.o. 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 Patient was recently admitted for symptomatic anemia secondary to prolonged post-menstrual bleeding. She was found to be anemic with Hgb 7.1 that further decreased to 6.5 in the emergency department with IV fluids, she was admitted for blood transfusion, 2 units.  Her follow up hgb improved, she was also treated with megace 40 mg for one day until bleeding improved and then she was transitioned to provera 5 mg qD, which she tolerated well over night and was considered stable for discharge. Since discharge from the hospital, she has been in a LTC facility where a repeat Hb was thought to be low and sent back to the ED, but at the ED, Hb was 8.5, so was discharged from ED without any intervention. She is here today for ED visit follow up. Patient is still been followed and evaluated for L great toe osteomyelitis which had been recommended for amputation by both Vascular and Orthopedic Surgeons. Patient is now in agreement with plan and has an appointment coming up. She has no new complaint today, vaginal bleeding is minimal, no symptom of anemia present. She is still on antibiotics (bactrim and Levaquin).   Problem  Postmenopausal Bleeding  Essential Hypertension  Poorly Controlled Type 2 Diabetes Mellitus (Hcc) (Resolved)  Type 2 Diabetes Mellitus With Hyperglycemia (Hcc) (Resolved)  Furuncle (Resolved)  Uncontrolled diabetes mellitus (HCC) (Resolved)  Htn (Hypertension) (Resolved)    ALLERGIES: Allergies  Allergen Reactions  . Penicillins Rash    Has patient had a PCN reaction causing  immediate rash, facial/tongue/throat swelling, SOB or lightheadedness with hypotension: Yes Has patient had a PCN reaction causing severe rash involving mucus membranes or skin necrosis: No Has patient had a PCN reaction that required hospitalization No Has patient had a PCN reaction occurring within the last 10 years: No If all of the above answers are "NO", then may proceed with Cephalosporin use.   Marland Kitchen Hydrocortisone Rash    Redness    PAST MEDICAL HISTORY: Past Medical History:  Diagnosis Date  . Blind   . Cataract   . Chest pain    a. ? MI in 2010, [place]->medically managed  . Diabetic foot ulcer ([place])   . Hypertension    x ~ 10 years  . Hypothyroidism   . Obesity   . Type II or unspecified type diabetes mellitus with unspecified complication, uncontrolled    x ~ 10 years    MEDICATIONS AT HOME: Prior to Admission medications   Medication Sig Start Date End Date Taking? Authorizing Provider  ACCU-CHEK SOFTCLIX LANCETS lancets  11/17/15   Historical Provider, MD  aspirin EC 81 MG EC tablet Take 1 tablet (81 mg total) by mouth daily. 02/05/16   Elsia Nigel Sloop, DO  atorvastatin (LIPITOR) 40 MG tablet Take 1 tablet (40 mg total) by mouth daily at 8 pm. Patient taking differently: Take 40 mg by mouth every morning.  02/11/16   Tresa Garter, MD  canagliflozin (INVOKANA) 100 MG TABS tablet TAKE ONE (1) TABLET BY MOUTH EVERY DAY 02/25/16   Tresa Garter, MD  clotrimazole (LOTRIMIN) 1 % cream APPLY TO AFFECTED  AREAS TWICE DAILY Patient taking differently: Apply 1 application topically 2 (two) times daily.  11/10/15   Tresa Garter, MD  diphenhydrAMINE (BENADRYL) 25 MG tablet Take 1 tablet (25 mg total) by mouth every 6 (six) hours as needed. Patient not taking: Reported on 02/25/2016 02/11/16   Tresa Garter, MD  DULoxetine (CYMBALTA) 30 MG capsule Take 1 capsule (30 mg total) by mouth daily. 01/22/16   Conrad Coats, MD  ferrous sulfate 325 (65 FE) MG tablet Take 1 tablet  (325 mg total) by mouth 2 (two) times daily with a meal. 03/02/16   Orpah Greek, MD  gabapentin (NEURONTIN) 300 MG capsule Take 1 capsule (300 mg total) by mouth 3 (three) times daily. Patient taking differently: Take 300 mg by mouth 2 (two) times daily.  12/24/15   Tresa Garter, MD  glucose blood (ACCU-CHEK AVIVA PLUS) test strip Check blood sugar 3 times daily. 01/28/16   Tresa Garter, MD  insulin aspart (NOVOLOG) 100 UNIT/ML injection SEE NOTES Dispense QH 1 month Patient taking differently: Inject 10 Units into the skin 3 (three) times daily with meals. Three times daily per Sliding scale: 121-150, 2 units : 151-200, 3 units : 201-250, 5 units : 251-300 8 units : 301-350, 11 units : 351-400, 15 units 07/23/15   Willeen Niece, MD  insulin glargine (LANTUS) 100 UNIT/ML injection Inject 0.2 mLs (20 Units total) into the skin at bedtime. 12/31/15   Tresa Garter, MD  Insulin Syringe-Needle U-100 (INSULIN SYRINGE 1CC/30GX5/16") 30G X 5/16" 1 ML MISC USE AS DIRECTED WITH LANTUS AND NOVOLOG INJECTIONS 10/08/15   Tresa Garter, MD  Lancets (ACCU-CHEK SOFT TOUCH) lancets Use as instructed 12/31/15   Tresa Garter, MD  levofloxacin (LEVAQUIN) 750 MG tablet Take 1 tablet (750 mg total) by mouth daily. 02/05/16   Elsia Nigel Sloop, DO  loteprednol (LOTEMAX) 0.5 % ophthalmic suspension Place 1 drop into both eyes 2 (two) times daily. 01/09/14   Tresa Garter, MD  megestrol (MEGACE) 40 MG tablet Take 1 tablet (40 mg total) by mouth 2 (two) times daily. Reduce to once daily one week after bleeding stops 03/02/16   Orpah Greek, MD  propranolol ER (INDERAL LA) 60 MG 24 hr capsule Take 1 capsule (60 mg total) by mouth daily. 07/23/15   Willeen Niece, MD  saxagliptin HCl (ONGLYZA) 2.5 MG TABS tablet Take 2.5 mg by mouth 2 (two) times daily.     Historical Provider, MD  senna (SENOKOT) 8.6 MG TABS tablet Take 2 tablets (17.2 mg total) by mouth at bedtime as needed (for  constipation). 07/23/15   Willeen Niece, MD  sulfamethoxazole-trimethoprim (BACTRIM DS,SEPTRA DS) 800-160 MG tablet Take 1 tablet by mouth every 12 (twelve) hours. 02/28/16   Everrett Coombe, MD  triamcinolone cream (KENALOG) 0.1 % Apply 1 application topically 2 (two) times daily. Patient taking differently: Apply 1 application topically daily as needed (for itching).  02/11/16   Tresa Garter, MD   Objective:   Vitals:   03/02/16 1320  BP: 118/69  Pulse: 99  Resp: 16  Temp: 98.3 F (36.8 C)  TempSrc: Oral  SpO2: 100%  Height: 5\' 5"  (1.651 m)    Exam General appearance : Awake, alert, not in any distress. Speech Clear. Not toxic looking HEENT: Atraumatic and Normocephalic, pupils equally reactive to light and accomodation Neck: Supple, no JVD. No cervical lymphadenopathy.  Chest: Good air entry bilaterally, no added sounds  CVS:  S1 S2 regular, no murmurs.  Abdomen: Bowel sounds present, Non tender and not distended with no gaurding, rigidity or rebound. Extremities: Erythema and mild swelling of L great toe. No fluctuance, Some tenderness with palpation. +DP pulse. R foot s/p midtarsal amputation. B/L Lower Ext shows no edema, both legs are warm to touch B/L Lower Ext shows no edema, both legs are warm to touch Neurology: Awake alert, and oriented X 3, CN II-XII intact, Non focal Skin: No Rash  Data Review Lab Results  Component Value Date   HGBA1C 6.4 02/11/2016   HGBA1C 6.3 12/24/2015   HGBA1C 13.50 02/05/2015     Assessment & Plan   1. Type 2 diabetes mellitus with hyperglycemia, unspecified long term insulin use status (HCC)  Aim for 30 minutes of exercise most days. Rethink what you drink. Water is great! Aim for 2-3 Carb Choices per meal (30-45 grams) +/- 1 either way  Aim for 0-15 Carbs per snack if hungry  Include protein in moderation with your meals and snacks  Consider reading food labels for Total Carbohydrate and Fat Grams of foods  Consider checking BG  at alternate times per day  Continue taking medication as directed Be mindful about how much sugar you are adding to beverages and other foods. Fruit Punch - find one with no sugar  Measure and decrease portions of carbohydrate foods  Make your plate and don't go back for seconds  2. Essential hypertension  We have discussed target BP range and blood pressure goal. I have advised patient to check BP regularly and to call us back or report to clinic if the numbers are consistently higher than 140/90. We discussed the importance of compliance with medical therapy and DASH diet recommended, consequences of uncontrolled hypertension discussed.  - continue current BP medications  3. Diabetic foot infection [facility])  Patient has appointment with Orthopedic Surgery - Amputation planned - Patient counseled  4. Postmenopausal bleeding  - Will refer to GYN for endometrial sampling/biopsy after current plan for amputation is over - Continue iron supplement and megace  Patient have been counseled extensively about nutrition and exercise  Return in about 4 weeks (around 03/30/2016) for Routine Follow Up, Hemoglobin A1C and Follow up, DM.  Interpreter was used to communicate directly with patient for the entire encounter including providing detailed patient instructions.   The patient was given clear instructions to go to ER or return to medical center if symptoms don't improve, worsen or new problems develop. The patient verbalized understanding. The patient was told to call to get lab results if they haven't heard anything in the next week.   This note has been created with Surveyor, quantity. Any transcriptional errors are unintentional.    Angelica Chessman, MD, [place], [place], [place], [place] and [place], [place]   03/02/2016, 1:55 PM

## 2016-03-02 NOTE — Telephone Encounter (Signed)
Danita from [facility] called and LVM last evening at 5:15pm stating that patient had critical lab values on a CBC; RBC-2.66, Hgb- 7.5, Hct- 23.1  Message stated that patient continues to bleed vaginally and Danita will be sending her to the ED although she has an appointment with Dr. Doreene Burke today. [place] notified.

## 2016-03-03 ENCOUNTER — Telehealth: Payer: Self-pay | Admitting: Internal Medicine

## 2016-03-03 NOTE — Telephone Encounter (Signed)
Ria Comment from [facility] is calling to see status of paperwork for Home health plan of care form.    Please follow up

## 2016-03-03 NOTE — Telephone Encounter (Signed)
MA spoke with home health department and informed them of Home Health plan of care being faxed this morning. No further questions at this time.

## 2016-03-04 ENCOUNTER — Telehealth: Payer: Self-pay | Admitting: *Deleted

## 2016-03-04 ENCOUNTER — Ambulatory Visit: Payer: Self-pay | Admitting: Vascular Surgery

## 2016-03-04 NOTE — Telephone Encounter (Signed)
[facility] Nurse called and stated patient is still bleeding. Patients CBC is critically low.  Nurse would like to know if she can check the cbc and bmp once a week? Nurse also wanted PCP's recommendation on Hospice for the patient. Nurse was informed of PCP returning Monday 03/07/16. MA will contact the nurse back with PCPs advice. No further questions at this time.

## 2016-03-08 ENCOUNTER — Encounter: Payer: Self-pay | Admitting: Vascular Surgery

## 2016-03-11 ENCOUNTER — Encounter (HOSPITAL_COMMUNITY): Payer: Self-pay | Admitting: *Deleted

## 2016-03-11 ENCOUNTER — Other Ambulatory Visit (HOSPITAL_COMMUNITY): Payer: Self-pay | Admitting: Family

## 2016-03-11 LAB — GLUCOSE, POCT (MANUAL RESULT ENTRY): POC GLUCOSE: 64 mg/dL — AB (ref 70–99)

## 2016-03-11 LAB — POCT GLYCOSYLATED HEMOGLOBIN (HGB A1C): Hemoglobin A1C: 6.4

## 2016-03-11 NOTE — Addendum Note (Signed)
Addended by: Trecia Rogers on: 03/11/2016 04:04 PM   Modules accepted: Orders

## 2016-03-11 NOTE — Progress Notes (Signed)
Pre op call compete using [address] [place], [place] # [id number].  I instructed patient to check CBG to check CBG and if it is less than 70 to treat it with Glucose Gel, Glucose tablets or 1/2 cup of clear juice like apple juice or cranberry juice, or 1/2 cup of regular soda. (not cream soda). I instructed patient to recheck CBG in 15 minutes and if CBG is not greater than 70, to  Call [phone] (pre- op). If it is before pre-op opens to retreat as before and recheck CBG in 15 minutes. I told patient to make note of time that liquid is taken and amount, that surgical time may have to be adjusted.

## 2016-03-11 NOTE — Progress Notes (Signed)
   03/11/16 1607  OBSTRUCTIVE SLEEP APNEA  Have you ever been diagnosed with sleep apnea through a sleep study? No  Do you snore loudly (loud enough to be heard through closed doors)?  1  Do you often feel tired, fatigued, or sleepy during the daytime (such as falling asleep during driving or talking to someone)? 0  Has anyone observed you stop breathing during your sleep? 1  Do you have, or are you being treated for high blood pressure? 1  BMI more than 35 kg/m2? 1  Age > 74 (1-yes) 1  Female Gender (Yes=1) 0  Obstructive Sleep Apnea Score 5  Score 5 or greater  Results sent to PCP

## 2016-03-11 NOTE — Addendum Note (Signed)
Addended by: Trecia Rogers on: 03/11/2016 03:58 PM   Modules accepted: Orders

## 2016-03-15 ENCOUNTER — Encounter (HOSPITAL_COMMUNITY): Payer: Self-pay | Admitting: Certified Registered Nurse Anesthetist

## 2016-03-15 ENCOUNTER — Other Ambulatory Visit: Payer: Self-pay | Admitting: Internal Medicine

## 2016-03-15 ENCOUNTER — Ambulatory Visit (HOSPITAL_COMMUNITY): Payer: Medicaid Other | Admitting: Certified Registered Nurse Anesthetist

## 2016-03-15 ENCOUNTER — Ambulatory Visit (HOSPITAL_COMMUNITY)
Admission: RE | Admit: 2016-03-15 | Discharge: 2016-03-15 | Disposition: A | Discharge status: Home / Self Care | Payer: Medicaid Other | Source: Ambulatory Visit | Attending: Orthopedic Surgery | Admitting: Orthopedic Surgery

## 2016-03-15 ENCOUNTER — Telehealth: Payer: Self-pay | Admitting: Internal Medicine

## 2016-03-15 ENCOUNTER — Encounter (HOSPITAL_COMMUNITY)
Admission: RE | Disposition: A | Discharge status: Home / Self Care | Payer: Self-pay | Source: Ambulatory Visit | Attending: Orthopedic Surgery

## 2016-03-15 DIAGNOSIS — I129 Hypertensive chronic kidney disease with stage 1 through stage 4 chronic kidney disease, or unspecified chronic kidney disease: Secondary | ICD-10-CM | POA: Diagnosis not present

## 2016-03-15 DIAGNOSIS — Z880 Allergy status to penicillin: Secondary | ICD-10-CM | POA: Diagnosis not present

## 2016-03-15 DIAGNOSIS — Z794 Long term (current) use of insulin: Secondary | ICD-10-CM | POA: Insufficient documentation

## 2016-03-15 DIAGNOSIS — I252 Old myocardial infarction: Secondary | ICD-10-CM | POA: Diagnosis not present

## 2016-03-15 DIAGNOSIS — E669 Obesity, unspecified: Secondary | ICD-10-CM | POA: Insufficient documentation

## 2016-03-15 DIAGNOSIS — E1152 Type 2 diabetes mellitus with diabetic peripheral angiopathy with gangrene: Secondary | ICD-10-CM | POA: Insufficient documentation

## 2016-03-15 DIAGNOSIS — E1122 Type 2 diabetes mellitus with diabetic chronic kidney disease: Secondary | ICD-10-CM | POA: Diagnosis not present

## 2016-03-15 DIAGNOSIS — E039 Hypothyroidism, unspecified: Secondary | ICD-10-CM | POA: Diagnosis not present

## 2016-03-15 DIAGNOSIS — E1140 Type 2 diabetes mellitus with diabetic neuropathy, unspecified: Secondary | ICD-10-CM | POA: Diagnosis not present

## 2016-03-15 DIAGNOSIS — N189 Chronic kidney disease, unspecified: Secondary | ICD-10-CM | POA: Insufficient documentation

## 2016-03-15 DIAGNOSIS — Z6838 Body mass index (BMI) 38.0-38.9, adult: Secondary | ICD-10-CM | POA: Diagnosis not present

## 2016-03-15 DIAGNOSIS — M869 Osteomyelitis, unspecified: Secondary | ICD-10-CM

## 2016-03-15 DIAGNOSIS — E11621 Type 2 diabetes mellitus with foot ulcer: Secondary | ICD-10-CM | POA: Diagnosis present

## 2016-03-15 HISTORY — DX: Chronic kidney disease, unspecified: N18.9

## 2016-03-15 HISTORY — DX: Reserved for inherently not codable concepts without codable children: IMO0001

## 2016-03-15 HISTORY — PX: AMPUTATION TOE: SHX6595

## 2016-03-15 HISTORY — DX: Personal history of other medical treatment: Z92.89

## 2016-03-15 HISTORY — DX: Polyneuropathy, unspecified: G62.9

## 2016-03-15 HISTORY — DX: Depression, unspecified: F32.A

## 2016-03-15 HISTORY — DX: Major depressive disorder, single episode, unspecified: F32.9

## 2016-03-15 LAB — BASIC METABOLIC PANEL
ANION GAP: 6 (ref 5–15)
BUN: 38 mg/dL — ABNORMAL HIGH (ref 6–20)
CO2: 22 mmol/L (ref 22–32)
Calcium: 9.3 mg/dL (ref 8.9–10.3)
Chloride: 111 mmol/L (ref 101–111)
Creatinine, Ser: 1.19 mg/dL — ABNORMAL HIGH (ref 0.44–1.00)
GFR, EST AFRICAN AMERICAN: 58 mL/min — AB (ref >60)
GFR, EST NON AFRICAN AMERICAN: 50 mL/min — AB (ref >60)
Glucose, Bld: 148 mg/dL — ABNORMAL HIGH (ref 65–99)
POTASSIUM: 6.3 mmol/L — AB (ref 3.5–5.1)
SODIUM: 139 mmol/L (ref 135–145)

## 2016-03-15 LAB — POCT I-STAT 4, (NA,K, GLUC, HGB,HCT)
GLUCOSE: 139 mg/dL — AB (ref 65–99)
HCT: 32 % — ABNORMAL LOW (ref 36.0–46.0)
Hemoglobin: 10.9 g/dL — ABNORMAL LOW (ref 12.0–15.0)
Potassium: 6.1 mmol/L — ABNORMAL HIGH (ref 3.5–5.1)
Sodium: 141 mmol/L (ref 135–145)

## 2016-03-15 LAB — CBC
HCT: 30.3 % — ABNORMAL LOW (ref 36.0–46.0)
HEMOGLOBIN: 9.5 g/dL — AB (ref 12.0–15.0)
MCH: 28.5 pg (ref 26.0–34.0)
MCHC: 31.4 g/dL (ref 30.0–36.0)
MCV: 91 fL (ref 78.0–100.0)
Platelets: 298 10*3/uL (ref 150–400)
RBC: 3.33 MIL/uL — AB (ref 3.87–5.11)
RDW: 15.3 % (ref 11.5–15.5)
WBC: 11.3 10*3/uL — AB (ref 4.0–10.5)

## 2016-03-15 LAB — GLUCOSE, CAPILLARY
GLUCOSE-CAPILLARY: 124 mg/dL — AB (ref 65–99)
GLUCOSE-CAPILLARY: 139 mg/dL — AB (ref 65–99)

## 2016-03-15 SURGERY — AMPUTATION, TOE
Anesthesia: Monitor Anesthesia Care | Site: Foot | Laterality: Left

## 2016-03-15 MED ORDER — OXYCODONE-ACETAMINOPHEN 5-325 MG PO TABS: 1.0000 | ORAL_TABLET | ORAL | 0 refills | Status: DC | PRN

## 2016-03-15 MED ORDER — PROPOFOL 10 MG/ML IV BOLUS
INTRAVENOUS | Status: AC
  Filled 2016-03-15: qty 20

## 2016-03-15 MED ORDER — PHENYLEPHRINE 40 MCG/ML (10ML) SYRINGE FOR IV PUSH (FOR BLOOD PRESSURE SUPPORT)
PREFILLED_SYRINGE | INTRAVENOUS | Status: DC | PRN
  Administered 2016-03-15: 40 ug via INTRAVENOUS
  Administered 2016-03-15: 80 ug via INTRAVENOUS
  Administered 2016-03-15: 40 ug via INTRAVENOUS

## 2016-03-15 MED ORDER — FENTANYL CITRATE (PF) 100 MCG/2ML IJ SOLN
INTRAMUSCULAR | Status: AC
  Filled 2016-03-15: qty 2

## 2016-03-15 MED ORDER — CHLORHEXIDINE GLUCONATE 4 % EX LIQD
60.0000 mL | Freq: Once | CUTANEOUS | Status: DC
Start: 2016-03-15 — End: 2016-03-15

## 2016-03-15 MED ORDER — 0.9 % SODIUM CHLORIDE (POUR BTL) OPTIME
TOPICAL | Status: DC | PRN
  Administered 2016-03-15: 1000 mL

## 2016-03-15 MED ORDER — FENTANYL CITRATE (PF) 100 MCG/2ML IJ SOLN
INTRAMUSCULAR | Status: AC
  Administered 2016-03-15: 50 ug
  Filled 2016-03-15: qty 2

## 2016-03-15 MED ORDER — MEPIVACAINE HCL 1.5 % IJ SOLN
INTRAMUSCULAR | Status: DC | PRN
  Administered 2016-03-15: 25 mL via PERINEURAL

## 2016-03-15 MED ORDER — MIDAZOLAM HCL 2 MG/2ML IJ SOLN
INTRAMUSCULAR | Status: AC
  Filled 2016-03-15: qty 2

## 2016-03-15 MED ORDER — SODIUM CHLORIDE 0.9 % IV SOLN
INTRAVENOUS | Status: DC
  Administered 2016-03-15: 10 mL/h via INTRAVENOUS
  Administered 2016-03-15: 09:00:00 via INTRAVENOUS

## 2016-03-15 MED ORDER — BUPIVACAINE-EPINEPHRINE (PF) 0.5% -1:200000 IJ SOLN
INTRAMUSCULAR | Status: DC | PRN
  Administered 2016-03-15: 15 mL via PERINEURAL

## 2016-03-15 MED ORDER — LIDOCAINE 2% (20 MG/ML) 5 ML SYRINGE
INTRAMUSCULAR | Status: DC | PRN
  Administered 2016-03-15: 20 mg via INTRAVENOUS

## 2016-03-15 MED ORDER — PROPOFOL 500 MG/50ML IV EMUL
INTRAVENOUS | Status: DC | PRN
  Administered 2016-03-15: 100 ug/kg/min via INTRAVENOUS

## 2016-03-15 MED ORDER — CLINDAMYCIN PHOSPHATE 900 MG/50ML IV SOLN
900.0000 mg | INTRAVENOUS | Status: AC
Start: 2016-03-15 — End: 2016-03-15
  Administered 2016-03-15: 900 mg via INTRAVENOUS
  Filled 2016-03-15: qty 50

## 2016-03-15 SURGICAL SUPPLY — 30 items
BLADE SAW SGTL MED 73X18.5 STR (BLADE) IMPLANT
BNDG COHESIVE 4X5 TAN STRL (GAUZE/BANDAGES/DRESSINGS) ×3 IMPLANT
BNDG GAUZE ELAST 4 BULKY (GAUZE/BANDAGES/DRESSINGS) ×3 IMPLANT
COVER SURGICAL LIGHT HANDLE (MISCELLANEOUS) ×6 IMPLANT
DRAPE U-SHAPE 47X51 STRL (DRAPES) ×6 IMPLANT
DRSG ADAPTIC 3X8 NADH LF (GAUZE/BANDAGES/DRESSINGS) ×3 IMPLANT
DRSG PAD ABDOMINAL 8X10 ST (GAUZE/BANDAGES/DRESSINGS) ×3 IMPLANT
DURAPREP 26ML APPLICATOR (WOUND CARE) ×3 IMPLANT
ELECT REM PT RETURN 9FT ADLT (ELECTROSURGICAL) ×3
ELECTRODE REM PT RTRN 9FT ADLT (ELECTROSURGICAL) ×1 IMPLANT
GAUZE SPONGE 4X4 12PLY STRL (GAUZE/BANDAGES/DRESSINGS) ×3 IMPLANT
GLOVE BIOGEL PI IND STRL 9 (GLOVE) ×1 IMPLANT
GLOVE BIOGEL PI INDICATOR 9 (GLOVE) ×2
GLOVE SURG ORTHO 9.0 STRL STRW (GLOVE) ×3 IMPLANT
GOWN STRL REUS W/ TWL LRG LVL3 (GOWN DISPOSABLE) ×1 IMPLANT
GOWN STRL REUS W/ TWL XL LVL3 (GOWN DISPOSABLE) ×2 IMPLANT
GOWN STRL REUS W/TWL LRG LVL3 (GOWN DISPOSABLE) ×2
GOWN STRL REUS W/TWL XL LVL3 (GOWN DISPOSABLE) ×4
KIT BASIN OR (CUSTOM PROCEDURE TRAY) ×3 IMPLANT
KIT ROOM TURNOVER OR (KITS) ×3 IMPLANT
NS IRRIG 1000ML POUR BTL (IV SOLUTION) ×3 IMPLANT
PACK ORTHO EXTREMITY (CUSTOM PROCEDURE TRAY) ×3 IMPLANT
PAD ARMBOARD 7.5X6 YLW CONV (MISCELLANEOUS) ×6 IMPLANT
SPONGE LAP 18X18 X RAY DECT (DISPOSABLE) ×3 IMPLANT
STOCKINETTE IMPERVIOUS LG (DRAPES) IMPLANT
SUT ETHILON 2 0 PSLX (SUTURE) ×6 IMPLANT
TOWEL OR 17X24 6PK STRL BLUE (TOWEL DISPOSABLE) ×3 IMPLANT
TOWEL OR 17X26 10 PK STRL BLUE (TOWEL DISPOSABLE) ×3 IMPLANT
UNDERPAD 30X30 (UNDERPADS AND DIAPERS) ×3 IMPLANT
WATER STERILE IRR 1000ML POUR (IV SOLUTION) ×3 IMPLANT

## 2016-03-15 NOTE — Progress Notes (Signed)
Notified both Dr. Gifford Shave and Dr. Sharol Given of critical potassium 6.3.  Then to verify drew an Istat which showed 6.1 potassium.

## 2016-03-15 NOTE — Telephone Encounter (Signed)
Patient verified DOB MA spoke with Donita and was informed of [place] refusing the patients visit due to [place] visiting the "other sister. Donita was informed of PCP stating the weekly blood draws were no longer necessary. Donita states she will discharge patient at this time since wound care is being provided by Dr. Sharol Given and Dr. Doreene Burke is PCP. No further questions at this time.

## 2016-03-15 NOTE — Anesthesia Procedure Notes (Signed)
Procedure Name: MAC Date/Time: 03/15/2016 9:13 AM Performed by: Everlean Cherry A Pre-anesthesia Checklist: Patient identified, Emergency Drugs available, Suction available and Patient being monitored Patient Re-evaluated:Patient Re-evaluated prior to inductionOxygen Delivery Method: Nasal cannula

## 2016-03-15 NOTE — Anesthesia Preprocedure Evaluation (Signed)
Anesthesia Evaluation  Patient identified by MRN, date of birth, ID band Patient awake    Reviewed: Allergy & Precautions, NPO status , Patient's Chart, lab work & pertinent test results  Airway Mallampati: II  TM Distance: >3 FB Neck ROM: Full    Dental  (+) Teeth Intact, Dental Advisory Given   Pulmonary neg pulmonary ROS,    Pulmonary exam normal breath sounds clear to auscultation       Cardiovascular Exercise Tolerance: Good hypertension, Pt. on medications + Past MI and + Peripheral Vascular Disease  Normal cardiovascular exam Rhythm:Regular Rate:Normal     Neuro/Psych PSYCHIATRIC DISORDERS Depression  Neuromuscular disease    GI/Hepatic negative GI ROS, Neg liver ROS,   Endo/Other  diabetes, Type 2, Insulin DependentHypothyroidism Obesity   Renal/GU Renal InsufficiencyRenal disease     Musculoskeletal negative musculoskeletal ROS (+)   Abdominal   Peds  Hematology  (+) Blood dyscrasia, anemia ,   Anesthesia Other Findings Day of surgery medications reviewed with the patient.  Reproductive/Obstetrics                             Anesthesia Physical Anesthesia Plan  ASA: II  Anesthesia Plan: Regional and MAC   Post-op Pain Management:    Induction: Intravenous  Airway Management Planned: Nasal Cannula  Additional Equipment:   Intra-op Plan:   Post-operative Plan:   Informed Consent: I have reviewed the patients History and Physical, chart, labs and discussed the procedure including the risks, benefits and alternatives for the proposed anesthesia with the patient or authorized representative who has indicated his/her understanding and acceptance.   Dental advisory given  Plan Discussed with:   Anesthesia Plan Comments: (Risks/benefits of regional block discussed with patient including risk of bleeding, infection, nerve damage, and possibility of failed block.  Also  discussed backup plan of general anesthesia and associated risks.  Patient wishes to proceed.)        Anesthesia Quick Evaluation

## 2016-03-15 NOTE — H&P (Signed)
Debbie [name] is an 55 y.o. female.   Chief Complaint: Ulceration osteomyelitis left great toe HPI: Patient is a 55 year old woman with diabetic insensate neuropathy peripheral vascular disease status post arteriogram has had progressive ulceration osteomyelitis of the left great toe.  Past Medical History:  Diagnosis Date  . Blind   . Cataract   . Chest pain    a. ? MI in 2010, [place]->medically managed  . Chronic kidney disease    acute renal disease - 02/2016- "from bleeding so much"  . Depression   . Diabetic foot ulcer ([place])   . History of blood transfusion    02/2016 abnormal bleeding vagina  . Hypertension    x ~ 10 years  . Hypothyroidism    no medication  . Neuropathy ([place])   . Obesity   . Shortness of breath dyspnea    with movement  . Type II or unspecified type diabetes mellitus with unspecified complication, uncontrolled    x ~ 10 years    Past Surgical History:  Procedure Laterality Date  . BACK SURGERY    . PERIPHERAL VASCULAR CATHETERIZATION N/A 02/01/2016   Procedure: Abdominal Aortogram w/Lower Extremity;  Surgeon: Conrad Nora, MD;  Location: [facility];  Service: Cardiovascular;  Laterality: N/A;  . TUBAL LIGATION      Family History  Problem Relation Age of Onset  . Diabetes Father     died in his 50's?/died in her 53's?  . Diabetes Sister     alive and well.   Social History:  reports that she has never smoked. She has never used smokeless tobacco. Her alcohol and drug histories are not on file.  Allergies:  Allergies  Allergen Reactions  . Penicillins Rash    Has patient had a PCN reaction causing immediate rash, facial/tongue/throat swelling, SOB or lightheadedness with hypotension: Yes Has patient had a PCN reaction causing severe rash involving mucus membranes or skin necrosis: No Has patient had a PCN reaction that required hospitalization No Has patient had a PCN reaction occurring within the last 10 years: No If all of the above  answers are "NO", then may proceed with Cephalosporin use.   Marland Kitchen Hydrocortisone Rash    Redness    Medications Prior to Admission  Medication Sig Dispense Refill  . ACCU-CHEK SOFTCLIX LANCETS lancets     . aspirin EC 81 MG EC tablet Take 1 tablet (81 mg total) by mouth daily. 30 tablet 0  . atorvastatin (LIPITOR) 40 MG tablet Take 1 tablet (40 mg total) by mouth daily at 8 pm. (Patient taking differently: Take 40 mg by mouth every morning. ) 90 tablet 3  . canagliflozin (INVOKANA) 100 MG TABS tablet TAKE ONE (1) TABLET BY MOUTH EVERY DAY 30 tablet 3  . clotrimazole (LOTRIMIN) 1 % cream APPLY TO AFFECTED AREAS TWICE DAILY (Patient taking differently: Apply 1 application topically 2 (two) times daily. ) 60 g 0  . diphenhydrAMINE (BENADRYL) 25 MG tablet Take 1 tablet (25 mg total) by mouth every 6 (six) hours as needed. (Patient not taking: Reported on 02/25/2016) 60 tablet 3  . DULoxetine (CYMBALTA) 30 MG capsule Take 1 capsule (30 mg total) by mouth daily. 30 capsule 3  . ferrous sulfate 325 (65 FE) MG tablet Take 1 tablet (325 mg total) by mouth 2 (two) times daily with a meal. 60 tablet 1  . gabapentin (NEURONTIN) 300 MG capsule Take 1 capsule (300 mg total) by mouth 3 (three) times daily. (Patient taking differently:  Take 300 mg by mouth 2 (two) times daily. ) 90 capsule 3  . glucose blood (ACCU-CHEK AVIVA PLUS) test strip Check blood sugar 3 times daily. 100 each 12  . insulin aspart (NOVOLOG) 100 UNIT/ML injection SEE NOTES Dispense QH 1 month (Patient taking differently: Inject 10 Units into the skin 3 (three) times daily with meals. Three times daily per Sliding scale: 121-150, 2 units : 151-200, 3 units : 201-250, 5 units : 251-300 8 units : 301-350, 11 units : 351-400, 15 units) 20 mL 2  . insulin glargine (LANTUS) 100 UNIT/ML injection Inject 0.2 mLs (20 Units total) into the skin at bedtime. 10 mL 3  . Insulin Syringe-Needle U-100 (INSULIN SYRINGE 1CC/30GX5/16") 30G X 5/16" 1 ML MISC USE  AS DIRECTED WITH LANTUS AND NOVOLOG INJECTIONS 100 each 12  . Lancets (ACCU-CHEK SOFT TOUCH) lancets Use as instructed 100 each 12  . levofloxacin (LEVAQUIN) 750 MG tablet Take 1 tablet (750 mg total) by mouth daily. 42 tablet 0  . loteprednol (LOTEMAX) 0.5 % ophthalmic suspension Place 1 drop into both eyes 2 (two) times daily. 15 mL 3  . megestrol (MEGACE) 40 MG tablet Take 1 tablet (40 mg total) by mouth 2 (two) times daily. Reduce to once daily one week after bleeding stops 60 tablet 1  . propranolol ER (INDERAL LA) 60 MG 24 hr capsule Take 1 capsule (60 mg total) by mouth daily. 90 capsule 0  . saxagliptin HCl (ONGLYZA) 2.5 MG TABS tablet Take 2.5 mg by mouth 2 (two) times daily.     Marland Kitchen senna (SENOKOT) 8.6 MG TABS tablet Take 2 tablets (17.2 mg total) by mouth at bedtime as needed (for constipation). 30 each 0  . sulfamethoxazole-trimethoprim (BACTRIM DS,SEPTRA DS) 800-160 MG tablet Take 1 tablet by mouth every 12 (twelve) hours. 18 tablet 0  . triamcinolone cream (KENALOG) 0.1 % Apply 1 application topically 2 (two) times daily. (Patient taking differently: Apply 1 application topically daily as needed (for itching). ) 80 g 3    No results found for this or any previous visit (from the past 48 hour(s)). No results found.  Review of Systems  All other systems reviewed and are negative.   Blood pressure (!) 154/48, pulse 88, temperature 98.3 F (36.8 C), temperature source Oral, resp. rate 18, height 5\' 5"  (1.651 m), weight 104.3 kg (230 lb), last menstrual period 05/21/2013, SpO2 100 %. Physical Exam   On examination patient has ulceration osteomyelitis left great toe. Assessment/Plan Assessment: Diabetic insensate neuropathy with ulceration osteomyelitis left great toe.  Plan: We'll plan for amputation of the left great toe risk and benefits were discussed including risk of the wound not healing. Patient states she understands wish to proceed at this time.  Newt Minion,  MD 03/15/2016, 7:01 AM

## 2016-03-15 NOTE — Telephone Encounter (Signed)
Donita from [facility] called requesting to speak with pt. Nurse b/c pt. Refused today's visit due to her sister being in the hospital. She also stated that Dr. Sharol Given is monitoring pt. Toe wound and she would like to know if pt. PCP wants her to run another lab before pt. Is discharged next week. Please f/u

## 2016-03-15 NOTE — OR Nursing (Signed)
Interpreter arrived at bs by 0945 and subsequently able to communicate with patient.

## 2016-03-15 NOTE — Anesthesia Procedure Notes (Signed)
Anesthesia Regional Block:  Popliteal block  Pre-Anesthetic Checklist: ,, timeout performed, Correct Patient, Correct Site, Correct Laterality, Correct Procedure, Correct Position, site marked, Risks and benefits discussed,  Surgical consent,  Pre-op evaluation,  At surgeon's request and post-op pain management  Laterality: Left  Prep: chloraprep       Needles:  Injection technique: Single-shot  Needle Type: Echogenic Needle     Needle Length: 9cm 9 cm Needle Gauge: 21 and 21 G    Additional Needles:  Procedures: ultrasound guided (picture in chart) Popliteal block Narrative:  Injection made incrementally with aspirations every 5 mL.  Performed by: Personally  Anesthesiologist: Catalina Gravel  Additional Notes: No pain on injection. No increased resistance to injection. Injection made in 5cc increments.  Good needle visualization.  Patient tolerated procedure well.  Combined popliteal/saphenous nerve block.

## 2016-03-15 NOTE — Op Note (Signed)
03/15/2016  9:38 AM  PATIENT:  Debbie [name]    PRE-OPERATIVE DIAGNOSIS:  Gangrene left great toe  POST-OPERATIVE DIAGNOSIS:  Same  PROCEDURE:  AMPUTATION LEFT GREAT TOE AT METATARSOPHALANGEAL JOINT  SURGEON:  Newt Minion, MD  PHYSICIAN ASSISTANT:None ANESTHESIA:   General  PREOPERATIVE INDICATIONS:  [name] is a  55 y.o. female with a diagnosis of Gangrene left great toe who failed conservative measures and elected for surgical management.    The risks benefits and alternatives were discussed with the patient preoperatively including but not limited to the risks of infection, bleeding, nerve injury, cardiopulmonary complications, the need for revision surgery, among others, and the patient was willing to proceed.  OPERATIVE IMPLANTS: None  OPERATIVE FINDINGS: Small amount of petechial bleeding  OPERATIVE PROCEDURE: Patient brought the operating room after undergoing a popliteal block. After adequate anesthesia obtained patient's left lower extremity was prepped using DuraPrep draped into a sterile field a timeout was called. A racquet incision was made just distal to the MTP joint of the left great toe. The toe was amputated through the MTP joint. Electrocautery was used for hemostasis wound was irrigated with normal saline. The incision was closed using 2-0 nylon. A sterile compressive dressing was applied patient was taken the PACU in stable condition plan for discharge to home.

## 2016-03-15 NOTE — Transfer of Care (Signed)
Immediate Anesthesia Transfer of Care Note  Patient: Debbie [name]  Procedure(s) Performed: Procedure(s): AMPUTATION LEFT GREAT TOE AT METATARSOPHALANGEAL JOINT (Left)  Patient Location: PACU  Anesthesia Type:MAC combined with regional for post-op pain  Level of Consciousness: awake, alert , oriented and patient cooperative  Airway & Oxygen Therapy: Patient Spontanous Breathing and Patient connected to nasal cannula oxygen  Post-op Assessment: Report given to RN and Post -op Vital signs reviewed and stable  Post vital signs: Reviewed and stable  Last Vitals:  Vitals:   03/15/16 0656 03/15/16 0850  BP: (!) 154/48 (!) 130/56  Pulse: 88 91  Resp: 18 17  Temp: 36.8 C     Last Pain:  Vitals:   03/15/16 0850  TempSrc:   PainSc: 0-No pain      Patients Stated Pain Goal: 1 ([id number])  Complications: No apparent anesthesia complications

## 2016-03-15 NOTE — Anesthesia Postprocedure Evaluation (Signed)
Anesthesia Post Note  Patient: Debbie [name]  Procedure(s) Performed: Procedure(s) (LRB): AMPUTATION LEFT GREAT TOE AT METATARSOPHALANGEAL JOINT (Left)  Patient location during evaluation: PACU Anesthesia Type: MAC and Regional Level of consciousness: awake and alert Pain management: pain level controlled Vital Signs Assessment: post-procedure vital signs reviewed and stable Respiratory status: spontaneous breathing, nonlabored ventilation, respiratory function stable and patient connected to nasal cannula oxygen Cardiovascular status: stable and blood pressure returned to baseline Anesthetic complications: no    Last Vitals:  Vitals:   03/15/16 1015 03/15/16 1024  BP:  110/60  Pulse: 91   Resp: 16 18  Temp: 36.4 C     Last Pain:  Vitals:   03/15/16 0850  TempSrc:   PainSc: 0-No pain                 Catalina Gravel

## 2016-03-16 ENCOUNTER — Encounter (HOSPITAL_COMMUNITY): Payer: Self-pay | Admitting: Orthopedic Surgery

## 2016-04-01 ENCOUNTER — Encounter: Payer: Self-pay | Admitting: Vascular Surgery

## 2016-04-05 NOTE — Progress Notes (Signed)
    Postoperative Visit   History of Present Illness  Debbie [name] is a 55 y.o. female s/p R TMA and L great toe amp who presents for postoperative follow-up from procedure on Date: Ao, Bro: 02/01/16.  The patient underwent L 1st ray amp with Dr Sharol Given on 03/15/16. The patient's wounds are not healed.  The patient notes pain in left foot with some hyperesthetic pain with light touch around the incision.  Reportedly, there is no drainage from the L 1st ray amp site.  Physical Examination  Vitals:   04/08/16 0956  BP: 123/68  Pulse: 100  Resp: 16  Temp: 99.2 F (37.3 C)  TempSrc: Oral  SpO2: 100%  Weight: 230 lb (104.3 kg)  Height: 5\' 5"  (1.651 m)    L foot: mild separation at L 1st ray amp site, good pink granulation, no fat necrosis, viable skin edges, cyanotic toes, no cellulitis skin changes  Medical Decision Making  Debbie [name] is a 55 y.o. female who presents s/p Ao, BRo for digital artery artery disease, s/p L 1st ray amp with Dr. Sharol Given. . This patient has miniscule tibial arteries consistent with her history of poorly controlled diabetes. I again emphasized to her the importance of maximal medical mgmt. I discussed in depth with the patient the nature of atherosclerosis, and emphasized the importance of maximal medical management including strict control of blood pressure, blood glucose, and lipid levels, obtaining regular exercise, and cessation of smoking.  The patient is aware that without maximal medical management the underlying atherosclerotic disease process will progress, limiting the benefit of any interventions. The patient is currently on a statin: Lipitor. The patient is currently not on an anti-platelet.  The patient will be started on ASA 81 mg PO daily. The patient can follow up with [place] as needed.   Will defer mgmt of the L foot to Dr. Sharol Given as no revascularization is necessary.  Thank you for allowing [place] to participate in this patient's care.  Adele Barthel, MD,  FACS [facility] Office: [phone] Pager: [phone]

## 2016-04-08 ENCOUNTER — Encounter: Payer: Self-pay | Admitting: Vascular Surgery

## 2016-04-08 ENCOUNTER — Ambulatory Visit (INDEPENDENT_AMBULATORY_CARE_PROVIDER_SITE_OTHER): Payer: Self-pay | Admitting: Vascular Surgery

## 2016-04-08 VITALS — BP 123/68 | HR 100 | Temp 99.2°F | Resp 16 | Ht 65.0 in | Wt 230.0 lb

## 2016-04-08 DIAGNOSIS — I739 Peripheral vascular disease, unspecified: Secondary | ICD-10-CM

## 2016-04-08 DIAGNOSIS — I70229 Atherosclerosis of native arteries of extremities with rest pain, unspecified extremity: Secondary | ICD-10-CM

## 2016-04-20 ENCOUNTER — Inpatient Hospital Stay (INDEPENDENT_AMBULATORY_CARE_PROVIDER_SITE_OTHER): Payer: Medicaid Other | Admitting: Orthopedic Surgery

## 2016-04-20 DIAGNOSIS — Z89412 Acquired absence of left great toe: Secondary | ICD-10-CM

## 2016-04-20 DIAGNOSIS — I70262 Atherosclerosis of native arteries of extremities with gangrene, left leg: Secondary | ICD-10-CM

## 2016-04-21 ENCOUNTER — Other Ambulatory Visit (HOSPITAL_COMMUNITY): Payer: Self-pay | Admitting: Family

## 2016-04-21 ENCOUNTER — Encounter (HOSPITAL_COMMUNITY): Payer: Self-pay | Admitting: General Practice

## 2016-04-21 MED ORDER — CLINDAMYCIN PHOSPHATE 900 MG/50ML IV SOLN
900.0000 mg | INTRAVENOUS | Status: AC
  Administered 2016-04-22: 900 mg via INTRAVENOUS
  Filled 2016-04-21: qty 50

## 2016-04-21 NOTE — Progress Notes (Signed)
Interpreter services used for pre-op phone call.  Interpreter ID # [id number].   Patient denies chest pain and acute shortness of breath.    Patient states that she checks her blood sugar 4-5 times daily and that her fasting glucose yesterday was 165.  Nurse informed patient that she could have nothing to eat or drink after midnight except for a sip of water with medications.  Patient instructed to take Inderal (Propanolol), Gabapentin, Cymbalta, and eye drops the day of surgery.  Nurse also informed patient that the night before surgery, patient only need to receive 10 units of Lantus and 0 units of Novolog the morning of surgery unless the blood sugar was over 220.  If blood sugar is over 220 the morning of surgery, nurse instructed patient to take 2.5 units of Novolog.  Patient was also given hypoglycemic protocol for day of surgery (if blood sugar less than 70, patient needed 1/2 cup of apple or cranberry juice, wait 15 minutes and recheck blood sugar.  If still below 70, then instructed to call [phone]).    Patient states that she is a difficult stick   Patient informed nurse that she is concerned that she will not have enough materials for wound care after surgery.  Nurse instructed patient to discuss with nurse and case manager after surgery.

## 2016-04-22 ENCOUNTER — Encounter (HOSPITAL_COMMUNITY): Payer: Self-pay | Admitting: *Deleted

## 2016-04-22 ENCOUNTER — Ambulatory Visit (HOSPITAL_COMMUNITY): Payer: Medicaid Other | Admitting: Anesthesiology

## 2016-04-22 ENCOUNTER — Encounter (HOSPITAL_COMMUNITY)
Admission: RE | Disposition: A | Discharge status: Home / Self Care | Payer: Self-pay | Source: Ambulatory Visit | Attending: Orthopedic Surgery

## 2016-04-22 ENCOUNTER — Ambulatory Visit (HOSPITAL_COMMUNITY)
Admission: RE | Admit: 2016-04-22 | Discharge: 2016-04-24 | Disposition: A | Discharge status: Home / Self Care | Payer: Medicaid Other | Source: Ambulatory Visit | Attending: Orthopedic Surgery | Admitting: Orthopedic Surgery

## 2016-04-22 DIAGNOSIS — Z91040 Latex allergy status: Secondary | ICD-10-CM | POA: Insufficient documentation

## 2016-04-22 DIAGNOSIS — E1169 Type 2 diabetes mellitus with other specified complication: Secondary | ICD-10-CM | POA: Insufficient documentation

## 2016-04-22 DIAGNOSIS — L03116 Cellulitis of left lower limb: Secondary | ICD-10-CM | POA: Diagnosis not present

## 2016-04-22 DIAGNOSIS — E1152 Type 2 diabetes mellitus with diabetic peripheral angiopathy with gangrene: Secondary | ICD-10-CM | POA: Insufficient documentation

## 2016-04-22 DIAGNOSIS — Z89412 Acquired absence of left great toe: Secondary | ICD-10-CM | POA: Insufficient documentation

## 2016-04-22 DIAGNOSIS — Z833 Family history of diabetes mellitus: Secondary | ICD-10-CM | POA: Insufficient documentation

## 2016-04-22 DIAGNOSIS — Z886 Allergy status to analgesic agent status: Secondary | ICD-10-CM | POA: Diagnosis not present

## 2016-04-22 DIAGNOSIS — E11621 Type 2 diabetes mellitus with foot ulcer: Secondary | ICD-10-CM | POA: Insufficient documentation

## 2016-04-22 DIAGNOSIS — I10 Essential (primary) hypertension: Secondary | ICD-10-CM | POA: Insufficient documentation

## 2016-04-22 DIAGNOSIS — E1140 Type 2 diabetes mellitus with diabetic neuropathy, unspecified: Secondary | ICD-10-CM | POA: Insufficient documentation

## 2016-04-22 DIAGNOSIS — E669 Obesity, unspecified: Secondary | ICD-10-CM | POA: Insufficient documentation

## 2016-04-22 DIAGNOSIS — M869 Osteomyelitis, unspecified: Secondary | ICD-10-CM | POA: Insufficient documentation

## 2016-04-22 DIAGNOSIS — Z880 Allergy status to penicillin: Secondary | ICD-10-CM | POA: Diagnosis not present

## 2016-04-22 DIAGNOSIS — F329 Major depressive disorder, single episode, unspecified: Secondary | ICD-10-CM | POA: Insufficient documentation

## 2016-04-22 DIAGNOSIS — I252 Old myocardial infarction: Secondary | ICD-10-CM | POA: Insufficient documentation

## 2016-04-22 DIAGNOSIS — Z885 Allergy status to narcotic agent status: Secondary | ICD-10-CM | POA: Diagnosis not present

## 2016-04-22 DIAGNOSIS — Z7982 Long term (current) use of aspirin: Secondary | ICD-10-CM | POA: Diagnosis not present

## 2016-04-22 DIAGNOSIS — Z794 Long term (current) use of insulin: Secondary | ICD-10-CM | POA: Diagnosis not present

## 2016-04-22 DIAGNOSIS — E039 Hypothyroidism, unspecified: Secondary | ICD-10-CM | POA: Insufficient documentation

## 2016-04-22 DIAGNOSIS — Z6838 Body mass index (BMI) 38.0-38.9, adult: Secondary | ICD-10-CM | POA: Insufficient documentation

## 2016-04-22 DIAGNOSIS — R2689 Other abnormalities of gait and mobility: Secondary | ICD-10-CM

## 2016-04-22 DIAGNOSIS — D649 Anemia, unspecified: Secondary | ICD-10-CM | POA: Diagnosis not present

## 2016-04-22 DIAGNOSIS — I70262 Atherosclerosis of native arteries of extremities with gangrene, left leg: Secondary | ICD-10-CM | POA: Diagnosis not present

## 2016-04-22 DIAGNOSIS — Z89432 Acquired absence of left foot: Secondary | ICD-10-CM

## 2016-04-22 HISTORY — PX: AMPUTATION: SHX166

## 2016-04-22 HISTORY — DX: Anemia, unspecified: D64.9

## 2016-04-22 LAB — CBC
HCT: 36 % (ref 36.0–46.0)
HEMOGLOBIN: 11.5 g/dL — AB (ref 12.0–15.0)
MCH: 27.1 pg (ref 26.0–34.0)
MCHC: 31.9 g/dL (ref 30.0–36.0)
MCV: 84.7 fL (ref 78.0–100.0)
PLATELETS: 296 10*3/uL (ref 150–400)
RBC: 4.25 MIL/uL (ref 3.87–5.11)
RDW: 13.4 % (ref 11.5–15.5)
WBC: 14.3 10*3/uL — ABNORMAL HIGH (ref 4.0–10.5)

## 2016-04-22 LAB — BASIC METABOLIC PANEL
ANION GAP: 8 (ref 5–15)
BUN: 29 mg/dL — ABNORMAL HIGH (ref 6–20)
CALCIUM: 9.3 mg/dL (ref 8.9–10.3)
CO2: 25 mmol/L (ref 22–32)
CREATININE: 0.96 mg/dL (ref 0.44–1.00)
Chloride: 107 mmol/L (ref 101–111)
GFR calc Af Amer: >60 mL/min (ref >60)
GLUCOSE: 189 mg/dL — AB (ref 65–99)
Potassium: 5.1 mmol/L (ref 3.5–5.1)
Sodium: 140 mmol/L (ref 135–145)

## 2016-04-22 LAB — GLUCOSE, CAPILLARY
GLUCOSE-CAPILLARY: 178 mg/dL — AB (ref 65–99)
Glucose-Capillary: 170 mg/dL — ABNORMAL HIGH (ref 65–99)
Glucose-Capillary: 196 mg/dL — ABNORMAL HIGH (ref 65–99)
Glucose-Capillary: 173 mg/dL — ABNORMAL HIGH (ref 65–99)
Glucose-Capillary: 135 mg/dL — ABNORMAL HIGH (ref 65–99)

## 2016-04-22 LAB — HCG, SERUM, QUALITATIVE: PREG SERUM: NEGATIVE

## 2016-04-22 SURGERY — AMPUTATION, FOOT, PARTIAL
Anesthesia: Monitor Anesthesia Care | Laterality: Left

## 2016-04-22 MED ORDER — OXYCODONE-ACETAMINOPHEN 5-325 MG PO TABS: 1.0000 | ORAL_TABLET | ORAL | 0 refills | Status: DC | PRN

## 2016-04-22 MED ORDER — MIDAZOLAM HCL 2 MG/2ML IJ SOLN
INTRAMUSCULAR | Status: AC
  Filled 2016-04-22: qty 2

## 2016-04-22 MED ORDER — DEXTROSE 5 % IV SOLN
500.0000 mg | Freq: Four times a day (QID) | INTRAVENOUS | Status: DC | PRN
  Filled 2016-04-22: qty 5

## 2016-04-22 MED ORDER — BUPIVACAINE-EPINEPHRINE (PF) 0.5% -1:200000 IJ SOLN
INTRAMUSCULAR | Status: DC | PRN
  Administered 2016-04-22: 25 mL via PERINEURAL

## 2016-04-22 MED ORDER — MEGESTROL ACETATE 40 MG PO TABS
40.0000 mg | ORAL_TABLET | Freq: Every day | ORAL | Status: DC
Start: 2016-04-23 — End: 2016-04-24
  Administered 2016-04-23 – 2016-04-24 (×2): 40 mg via ORAL
  Filled 2016-04-22 (×2): qty 1

## 2016-04-22 MED ORDER — MIDAZOLAM HCL 5 MG/5ML IJ SOLN
INTRAMUSCULAR | Status: DC | PRN
  Administered 2016-04-22: 1 mg via INTRAVENOUS

## 2016-04-22 MED ORDER — LIDOCAINE 2% (20 MG/ML) 5 ML SYRINGE
INTRAMUSCULAR | Status: AC
  Filled 2016-04-22: qty 5

## 2016-04-22 MED ORDER — DULOXETINE HCL 30 MG PO CPEP
30.0000 mg | ORAL_CAPSULE | Freq: Every day | ORAL | Status: DC
  Administered 2016-04-23 – 2016-04-24 (×2): 30 mg via ORAL
  Filled 2016-04-22 (×2): qty 1

## 2016-04-22 MED ORDER — FENTANYL CITRATE (PF) 100 MCG/2ML IJ SOLN
INTRAMUSCULAR | Status: AC
  Administered 2016-04-22: 25 ug via INTRAVENOUS
  Filled 2016-04-22: qty 2

## 2016-04-22 MED ORDER — ONDANSETRON HCL 4 MG/2ML IJ SOLN
INTRAMUSCULAR | Status: AC
  Filled 2016-04-22: qty 2

## 2016-04-22 MED ORDER — PROPOFOL 500 MG/50ML IV EMUL
INTRAVENOUS | Status: DC | PRN
  Administered 2016-04-22: 75 ug/kg/min via INTRAVENOUS

## 2016-04-22 MED ORDER — CHLORHEXIDINE GLUCONATE 4 % EX LIQD: 60.0000 mL | Freq: Once | CUTANEOUS | Status: DC

## 2016-04-22 MED ORDER — ONDANSETRON HCL 4 MG/2ML IJ SOLN
INTRAMUSCULAR | Status: DC | PRN
  Administered 2016-04-22: 4 mg via INTRAVENOUS

## 2016-04-22 MED ORDER — PROPRANOLOL HCL ER 60 MG PO CP24
60.0000 mg | ORAL_CAPSULE | Freq: Every day | ORAL | Status: DC
  Administered 2016-04-23 – 2016-04-24 (×2): 60 mg via ORAL
  Filled 2016-04-22 (×3): qty 1

## 2016-04-22 MED ORDER — ACETAMINOPHEN 325 MG PO TABS: 650.0000 mg | ORAL_TABLET | Freq: Four times a day (QID) | ORAL | Status: DC | PRN

## 2016-04-22 MED ORDER — ACETAMINOPHEN 650 MG RE SUPP: 650.0000 mg | Freq: Four times a day (QID) | RECTAL | Status: DC | PRN

## 2016-04-22 MED ORDER — INSULIN ASPART 100 UNIT/ML ~~LOC~~ SOLN
4.0000 [IU] | Freq: Three times a day (TID) | SUBCUTANEOUS | Status: DC
  Administered 2016-04-23 – 2016-04-24 (×3): 4 [IU] via SUBCUTANEOUS

## 2016-04-22 MED ORDER — INSULIN GLARGINE 100 UNIT/ML ~~LOC~~ SOLN
10.0000 [IU] | Freq: Every day | SUBCUTANEOUS | Status: DC
  Administered 2016-04-22 – 2016-04-23 (×2): 10 [IU] via SUBCUTANEOUS
  Filled 2016-04-22 (×3): qty 0.1

## 2016-04-22 MED ORDER — METHOCARBAMOL 500 MG PO TABS
500.0000 mg | ORAL_TABLET | Freq: Four times a day (QID) | ORAL | Status: DC | PRN
  Administered 2016-04-23 – 2016-04-24 (×2): 500 mg via ORAL
  Filled 2016-04-22 (×2): qty 1

## 2016-04-22 MED ORDER — FENTANYL CITRATE (PF) 100 MCG/2ML IJ SOLN
INTRAMUSCULAR | Status: AC
  Filled 2016-04-22: qty 2

## 2016-04-22 MED ORDER — CLINDAMYCIN PHOSPHATE 600 MG/50ML IV SOLN
600.0000 mg | Freq: Four times a day (QID) | INTRAVENOUS | Status: AC
  Administered 2016-04-22 – 2016-04-23 (×3): 600 mg via INTRAVENOUS
  Filled 2016-04-22 (×3): qty 50

## 2016-04-22 MED ORDER — GABAPENTIN 300 MG PO CAPS
300.0000 mg | ORAL_CAPSULE | Freq: Three times a day (TID) | ORAL | Status: DC
  Administered 2016-04-22 – 2016-04-24 (×6): 300 mg via ORAL
  Filled 2016-04-22 (×6): qty 1

## 2016-04-22 MED ORDER — ONDANSETRON HCL 4 MG/2ML IJ SOLN: 4.0000 mg | Freq: Four times a day (QID) | INTRAMUSCULAR | Status: DC | PRN

## 2016-04-22 MED ORDER — LINAGLIPTIN 5 MG PO TABS
5.0000 mg | ORAL_TABLET | Freq: Every day | ORAL | Status: DC
  Administered 2016-04-23 – 2016-04-24 (×2): 5 mg via ORAL
  Filled 2016-04-22 (×2): qty 1

## 2016-04-22 MED ORDER — ONDANSETRON HCL 4 MG PO TABS: 4.0000 mg | ORAL_TABLET | Freq: Four times a day (QID) | ORAL | Status: DC | PRN

## 2016-04-22 MED ORDER — OXYCODONE HCL 5 MG PO TABS
5.0000 mg | ORAL_TABLET | ORAL | Status: DC | PRN
  Administered 2016-04-23 – 2016-04-24 (×4): 10 mg via ORAL
  Administered 2016-04-24: 5 mg via ORAL
  Filled 2016-04-22 (×5): qty 2

## 2016-04-22 MED ORDER — HYDROMORPHONE HCL 1 MG/ML IJ SOLN: 1.0000 mg | INTRAMUSCULAR | Status: DC | PRN

## 2016-04-22 MED ORDER — PROPOFOL 10 MG/ML IV BOLUS
INTRAVENOUS | Status: AC
  Filled 2016-04-22: qty 20

## 2016-04-22 MED ORDER — SODIUM CHLORIDE 0.9 % IV SOLN: INTRAVENOUS | Status: DC

## 2016-04-22 MED ORDER — INSULIN ASPART 100 UNIT/ML ~~LOC~~ SOLN
0.0000 [IU] | Freq: Three times a day (TID) | SUBCUTANEOUS | Status: DC
Start: 2016-04-22 — End: 2016-04-24
  Administered 2016-04-22: 3 [IU] via SUBCUTANEOUS
  Administered 2016-04-23: 5 [IU] via SUBCUTANEOUS
  Administered 2016-04-24 (×2): 2 [IU] via SUBCUTANEOUS

## 2016-04-22 MED ORDER — METOCLOPRAMIDE HCL 5 MG PO TABS: 5.0000 mg | ORAL_TABLET | Freq: Three times a day (TID) | ORAL | Status: DC | PRN

## 2016-04-22 MED ORDER — ASPIRIN EC 81 MG PO TBEC
81.0000 mg | DELAYED_RELEASE_TABLET | Freq: Every day | ORAL | Status: DC
  Administered 2016-04-22 – 2016-04-24 (×3): 81 mg via ORAL
  Filled 2016-04-22 (×3): qty 1

## 2016-04-22 MED ORDER — FENTANYL CITRATE (PF) 100 MCG/2ML IJ SOLN
INTRAMUSCULAR | Status: DC | PRN
  Administered 2016-04-22: 50 ug via INTRAVENOUS

## 2016-04-22 MED ORDER — FENTANYL CITRATE (PF) 100 MCG/2ML IJ SOLN
25.0000 ug | INTRAMUSCULAR | Status: DC | PRN
  Administered 2016-04-22: 25 ug via INTRAVENOUS

## 2016-04-22 MED ORDER — SODIUM CHLORIDE 0.9 % IV SOLN
INTRAVENOUS | Status: DC
  Administered 2016-04-22: 11:00:00 via INTRAVENOUS

## 2016-04-22 MED ORDER — METOCLOPRAMIDE HCL 5 MG/ML IJ SOLN: 5.0000 mg | Freq: Three times a day (TID) | INTRAMUSCULAR | Status: DC | PRN

## 2016-04-22 MED ORDER — LOTEPREDNOL ETABONATE 0.5 % OP SUSP
1.0000 [drp] | Freq: Two times a day (BID) | OPHTHALMIC | Status: DC
  Administered 2016-04-22 – 2016-04-24 (×4): 1 [drp] via OPHTHALMIC
  Filled 2016-04-22 (×2): qty 5

## 2016-04-22 SURGICAL SUPPLY — 31 items
BLADE SAW SGTL HD 18.5X60.5X1. (BLADE) ×2 IMPLANT
BLADE SURG 21 STRL SS (BLADE) ×2 IMPLANT
BNDG COHESIVE 4X5 TAN STRL (GAUZE/BANDAGES/DRESSINGS) ×2 IMPLANT
BNDG GAUZE ELAST 4 BULKY (GAUZE/BANDAGES/DRESSINGS) ×2 IMPLANT
COVER SURGICAL LIGHT HANDLE (MISCELLANEOUS) ×2 IMPLANT
DRAPE U-SHAPE 47X51 STRL (DRAPES) ×2 IMPLANT
DRSG ADAPTIC 3X8 NADH LF (GAUZE/BANDAGES/DRESSINGS) ×2 IMPLANT
DRSG PAD ABDOMINAL 8X10 ST (GAUZE/BANDAGES/DRESSINGS) IMPLANT
DURAPREP 26ML APPLICATOR (WOUND CARE) ×2 IMPLANT
ELECT REM PT RETURN 9FT ADLT (ELECTROSURGICAL) ×2
ELECTRODE REM PT RTRN 9FT ADLT (ELECTROSURGICAL) ×1 IMPLANT
GAUZE SPONGE 4X4 12PLY STRL (GAUZE/BANDAGES/DRESSINGS) ×2 IMPLANT
GLOVE BIOGEL PI IND STRL 9 (GLOVE) ×1 IMPLANT
GLOVE BIOGEL PI INDICATOR 9 (GLOVE) ×1
GLOVE SURG ORTHO 9.0 STRL STRW (GLOVE) ×2 IMPLANT
GOWN STRL REUS W/ TWL XL LVL3 (GOWN DISPOSABLE) ×3 IMPLANT
GOWN STRL REUS W/TWL XL LVL3 (GOWN DISPOSABLE) ×3
KIT BASIN OR (CUSTOM PROCEDURE TRAY) ×2 IMPLANT
KIT ROOM TURNOVER OR (KITS) ×2 IMPLANT
NS IRRIG 1000ML POUR BTL (IV SOLUTION) ×2 IMPLANT
PACK ORTHO EXTREMITY (CUSTOM PROCEDURE TRAY) ×2 IMPLANT
PAD ABD 8X10 STRL (GAUZE/BANDAGES/DRESSINGS) ×2 IMPLANT
PAD ARMBOARD 7.5X6 YLW CONV (MISCELLANEOUS) ×4 IMPLANT
SPONGE LAP 18X18 X RAY DECT (DISPOSABLE) IMPLANT
SUT ETHILON 2 0 PSLX (SUTURE) ×4 IMPLANT
SUT VIC AB 2-0 CTB1 (SUTURE) IMPLANT
TOWEL OR 17X24 6PK STRL BLUE (TOWEL DISPOSABLE) ×2 IMPLANT
TOWEL OR 17X26 10 PK STRL BLUE (TOWEL DISPOSABLE) ×2 IMPLANT
TUBE CONNECTING 12X1/4 (SUCTIONS) ×2 IMPLANT
WATER STERILE IRR 1000ML POUR (IV SOLUTION) IMPLANT
YANKAUER SUCT BULB TIP NO VENT (SUCTIONS) ×2 IMPLANT

## 2016-04-22 NOTE — Transfer of Care (Signed)
Immediate Anesthesia Transfer of Care Note  Patient: Debbie [name]  Procedure(s) Performed: Procedure(s): Left Transmetatarsal Amputation (Left)  Patient Location: PACU  Anesthesia Type:MAC  Level of Consciousness: awake and patient cooperative  Airway & Oxygen Therapy: Patient Spontanous Breathing and Patient connected to face mask oxygen  Post-op Assessment: Report given to RN and Post -op Vital signs reviewed and stable  Post vital signs: Reviewed  Last Vitals:  Vitals:   04/22/16 0940  BP: (!) 158/65  Pulse: (!) 102  Resp: 18  Temp: 36.8 C    Last Pain:  Vitals:   04/22/16 1106  TempSrc:   PainSc: 7       Patients Stated Pain Goal:  (can't give a number) ([id number])  Complications: No apparent anesthesia complications

## 2016-04-22 NOTE — Progress Notes (Signed)
   04/22/16 1103  OBSTRUCTIVE SLEEP APNEA  Have you ever been diagnosed with sleep apnea through a sleep study? No  Do you snore loudly (loud enough to be heard through closed doors)?  1  Do you often feel tired, fatigued, or sleepy during the daytime (such as falling asleep during driving or talking to someone)? 0  Has anyone observed you stop breathing during your sleep? 1  Do you have, or are you being treated for high blood pressure? 1  BMI more than 35 kg/m2? 1  Age > 50 (1-yes) 1  Neck circumference greater than:Female 16 inches or larger, Female 17inches or larger? 1  Female Gender (Yes=1) 0  Obstructive Sleep Apnea Score 6  Score 5 or greater  Results sent to PCP

## 2016-04-22 NOTE — Progress Notes (Signed)
Interpreter today: Debbie [name], arabic

## 2016-04-22 NOTE — Anesthesia Procedure Notes (Signed)
Procedure Name: MAC Date/Time: 04/22/2016 12:42 PM Performed by: Jenne Campus Pre-anesthesia Checklist: Patient identified, Emergency Drugs available, Suction available, Patient being monitored and Timeout performed Patient Re-evaluated:Patient Re-evaluated prior to inductionOxygen Delivery Method: Simple face mask

## 2016-04-22 NOTE — Progress Notes (Signed)
Orthopedic Tech Progress Note Patient Details:  Debbie [name] 1960/09/29 [id number] Dropped off with patient. Ortho Devices Type of Ortho Device: Postop shoe/boot Ortho Device/Splint Location: Lt Foot Ortho Device/Splint Interventions: Berenice Primas 04/22/2016, 6:10 PM

## 2016-04-22 NOTE — H&P (Signed)
Debbie [name] is an 55 y.o. female.   Chief Complaint: Ulceration osteomyelitis left forefoot. HPI: Patient is a 55 year old woman with diabetic insensate neuropathy status post amputation of the left great toe who presents with progressive ulceration and infection involving the remainder of the left forefoot.  Past Medical History:  Diagnosis Date  . Anemia   . Blind   . Cataract   . Chest pain    a. ? MI in 2010, [place]->medically managed  . Chronic kidney disease    acute renal disease - 02/2016- "from bleeding so much"  . Depression   . Diabetic foot ulcer ([place])   . History of blood transfusion    02/2016 abnormal bleeding vagina  . Hypertension    x ~ 10 years  . Hypothyroidism    no medication  . Neuropathy ([place])   . Obesity   . Shortness of breath dyspnea    with movement  . Type II or unspecified type diabetes mellitus with unspecified complication, uncontrolled    x ~ 10 years    Past Surgical History:  Procedure Laterality Date  . AMPUTATION TOE Left 03/15/2016   Procedure: AMPUTATION LEFT GREAT TOE AT METATARSOPHALANGEAL JOINT;  Surgeon: Newt Minion, MD;  Location: [facility];  Service: Orthopedics;  Laterality: Left;  . BACK SURGERY    . IR GENERIC HISTORICAL  01/28/2016   IR RADIOLOGIST EVAL & MGMT 01/28/2016 Jacqulynn Cadet, MD [facility] INTERV RAD  . PERIPHERAL VASCULAR CATHETERIZATION N/A 02/01/2016   Procedure: Abdominal Aortogram w/Lower Extremity;  Surgeon: Conrad [place], MD;  Location: [facility];  Service: Cardiovascular;  Laterality: N/A;  . TUBAL LIGATION      Family History  Problem Relation Age of Onset  . Diabetes Father     died in his 50's?/died in her 72's?  . Diabetes Sister     alive and well.   Social History:  reports that she has never smoked. She has never used smokeless tobacco. Her alcohol and drug histories are not on file.  Allergies:  Allergies  Allergen Reactions  . Penicillins Rash    Has patient had a PCN reaction causing  immediate rash, facial/tongue/throat swelling, SOB or lightheadedness with hypotension: Yes Has patient had a PCN reaction causing severe rash involving mucus membranes or skin necrosis: No Has patient had a PCN reaction that required hospitalization No Has patient had a PCN reaction occurring within the last 10 years: No If all of the above answers are "NO", then may proceed with Cephalosporin use.   Marland Kitchen Hydrocodone Rash  . Hydrocortisone Rash    Redness  . Tylenol With Codeine #3 [Acetaminophen-Codeine] Swelling and Rash    SWELLING REACTION UNSPECIFIED     No prescriptions prior to admission.    No results found for this or any previous visit (from the past 48 hour(s)). No results found.  Review of Systems  All other systems reviewed and are negative.   Last menstrual period 05/21/2013. Physical Exam  On examination patient has a palpable dorsalis pedis pulse first ray amputation has healed well she has progressive ulceration osteomyelitis and cellulitis involving the left forefoot. Assessment/Plan Assessment: Diabetic insensate neuropathy with ulceration osteomyelitis cellulitis left forefoot.  Plan: We'll plan for left transmetatarsal amputation risks and benefits were discussed including risk of the wound not healing. Patient states she understands wish to proceed at this time.  Newt Minion, MD 04/22/2016, 7:07 AM

## 2016-04-22 NOTE — Op Note (Signed)
04/22/2016  3:38 PM  PATIENT:  Debbie [name]    PRE-OPERATIVE DIAGNOSIS:  Gangrene Left Foot  POST-OPERATIVE DIAGNOSIS:  Same  PROCEDURE:  Left Transmetatarsal Amputation  SURGEON:  Newt Minion, MD  PHYSICIAN ASSISTANT:None ANESTHESIA:   General  PREOPERATIVE INDICATIONS:  Debbie [name] is a  55 y.o. female with a diagnosis of Gangrene Left Foot who failed conservative measures and elected for surgical management.    The risks benefits and alternatives were discussed with the patient preoperatively including but not limited to the risks of infection, bleeding, nerve injury, cardiopulmonary complications, the need for revision surgery, among others, and the patient was willing to proceed.  OPERATIVE IMPLANTS: None  OPERATIVE FINDINGS: Ischemic skin just distal to the incision  OPERATIVE PROCEDURE: Patient brought to the operating room and underwent a regional anesthetic. After adequate anesthesia were obtained patient's left lower extremity was prepped using DuraPrep draped into a sterile field. A timeout was called. A fishmouth incision was made just proximal to the necrotic skin. A transmetatarsal amputation was performed with an oscillating saw. Electrocautery was used for hemostasis of wound was irrigated with normal saline incision was closed using 2-0 nylon a sterile dressing was applied patient was taken to the PACU in stable condition.

## 2016-04-22 NOTE — Anesthesia Preprocedure Evaluation (Signed)
Anesthesia Evaluation  Patient identified by MRN, date of birth, ID band Patient awake    Reviewed: Allergy & Precautions, NPO status , Patient's Chart, lab work & pertinent test results  Airway Mallampati: II  TM Distance: >3 FB Neck ROM: Full    Dental  (+) Teeth Intact, Dental Advisory Given   Pulmonary neg pulmonary ROS,    Pulmonary exam normal breath sounds clear to auscultation       Cardiovascular Exercise Tolerance: Good hypertension, Pt. on medications + Past MI and + Peripheral Vascular Disease  Normal cardiovascular exam Rhythm:Regular Rate:Normal     Neuro/Psych PSYCHIATRIC DISORDERS Depression  Neuromuscular disease    GI/Hepatic negative GI ROS, Neg liver ROS,   Endo/Other  diabetes, Type 2, Insulin DependentHypothyroidism Obesity   Renal/GU Renal InsufficiencyRenal disease     Musculoskeletal negative musculoskeletal ROS (+)   Abdominal   Peds  Hematology  (+) Blood dyscrasia, anemia ,   Anesthesia Other Findings Day of surgery medications reviewed with the patient.  Reproductive/Obstetrics                             Anesthesia Physical  Anesthesia Plan  ASA: III  Anesthesia Plan: Regional and MAC   Post-op Pain Management:  Regional for Post-op pain   Induction: Intravenous  Airway Management Planned: Nasal Cannula  Additional Equipment:   Intra-op Plan:   Post-operative Plan:   Informed Consent: I have reviewed the patients History and Physical, chart, labs and discussed the procedure including the risks, benefits and alternatives for the proposed anesthesia with the patient or authorized representative who has indicated his/her understanding and acceptance.   Dental advisory given  Plan Discussed with:   Anesthesia Plan Comments: (Risks/benefits of regional block discussed with patient including risk of bleeding, infection, nerve damage, and  possibility of failed block.  Also discussed backup plan of general anesthesia and associated risks.  Patient wishes to proceed.)        Anesthesia Quick Evaluation

## 2016-04-22 NOTE — Anesthesia Postprocedure Evaluation (Addendum)
Anesthesia Post Note  Patient: Debbie [name]  Procedure(s) Performed: Procedure(s) (LRB): Left Transmetatarsal Amputation (Left)  Patient location during evaluation: PACU Anesthesia Type: MAC Level of consciousness: awake and alert Pain management: pain level controlled Vital Signs Assessment: post-procedure vital signs reviewed and stable Respiratory status: spontaneous breathing, nonlabored ventilation and respiratory function stable Cardiovascular status: blood pressure returned to baseline and stable Postop Assessment: no signs of nausea or vomiting Anesthetic complications: no    Last Vitals:  Vitals:   04/22/16 1537 04/22/16 1554  BP: (!) 111/56 (!) 110/50  Pulse: 86 85  Resp: 15 16  Temp: 37.1 C 36.6 C    Last Pain:  Vitals:   04/22/16 1554  TempSrc: Oral  PainSc:                  CREWS,DAVID A

## 2016-04-22 NOTE — Anesthesia Procedure Notes (Signed)
Anesthesia Regional Block:  Popliteal block  Pre-Anesthetic Checklist: ,, timeout performed, Correct Patient, Correct Site, Correct Laterality, Correct Procedure, Correct Position, site marked, Risks and benefits discussed,  Surgical consent,  Pre-op evaluation,  At surgeon's request and post-op pain management  Laterality: Left  Prep: chloraprep       Needles:  Injection technique: Single-shot  Needle Type: Echogenic Needle     Needle Length: 5cm 5 cm     Additional Needles:  Procedures: ultrasound guided (picture in chart) Popliteal block Narrative:  Start time: 04/22/2016 12:14 PM End time: 04/22/2016 12:24 PM Injection made incrementally with aspirations every 25 mL.  Performed by: Personally  Anesthesiologist: Reginal Lutes  Additional Notes: Patient tolerated procedure well

## 2016-04-23 DIAGNOSIS — E1152 Type 2 diabetes mellitus with diabetic peripheral angiopathy with gangrene: Secondary | ICD-10-CM | POA: Diagnosis not present

## 2016-04-23 LAB — GLUCOSE, CAPILLARY
GLUCOSE-CAPILLARY: 192 mg/dL — AB (ref 65–99)
Glucose-Capillary: 110 mg/dL — ABNORMAL HIGH (ref 65–99)
Glucose-Capillary: 133 mg/dL — ABNORMAL HIGH (ref 65–99)
Glucose-Capillary: 220 mg/dL — ABNORMAL HIGH (ref 65–99)

## 2016-04-23 NOTE — Progress Notes (Addendum)
Physical Therapy Evaluation Patient Details Name: Debbie [name] MRN: [id number] DOB: [birth year] Today's Date: 04/23/2016   History of Present Illness  Patient had a L metatarsel amputation on  04/22/2016. She has also had a right foot amputation as well. PMH includes: blindness hypothyroid, HTN, Diabetic foot ulcer. neuropathy  Clinical Impression  Patient Is TDWB following procedure. She required Max A +2 to get to her wheelchair. She received education on weight bearing status but did not appear to follow TDWB. She has stairs to get up into the house. Her sister reports they will find a way to get her into the house. She is a fall risk. She would benefit from rehab at a SNF but patients sister is refusing. Acute therapy will continue to follow her for mobility training.     Follow Up Recommendations SNF    Equipment Recommendations  Rolling walker with 5" wheels;3in1 (PT)    Recommendations for Other Services Rehab consult     Precautions / Restrictions Precautions Precautions: Fall  TDWB L LE       Mobility  Bed Mobility Overal bed mobility: +2 for physical assistance             General bed mobility comments: Patient required the assist of the therpsit and her sister to sit at the edge of the bed. At the edge of the bed the patient required min a to remain sitting.   Transfers Overall transfer level: Needs assistance Equipment used: Rolling walker (2 wheeled) Transfers: Sit to/from [facility] Sit to Stand: +2 physical assistance Max A  Stand pivot transfers: +2 physical assistance Max A        General transfer comment: Education provided through the interpreter about weight bearing stauts but it appeared the patient did not follow weight bearing percuations. The patient required 2 person transfer to a chair.   Ambulation/Gait Ambulation/Gait assistance: +2 physical assistance Ambulation Distance (Feet): 2 Feet         General Gait Details:  Despite cuing to pivot to the chair the patient took a step. Therapy again thrtough the interpreter educated the patient on weight bearing status  Stairs Stairs: Yes Stairs assistance: Total assist     General stair comments: Patient ould not attmept stairs. She could not maintain standing   Information systems manager mobility: Yes Wheelchair Assistance Details (indicate cue type and reason): Patients sister pushes the patient in the wheelchiar,   Modified Rankin (Stroke Patients Only)       Balance Overall balance assessment: Needs assistance Sitting-balance support: Bilateral upper extremity supported Sitting balance-Leahy Scale: Poor Sitting balance - Comments: Needed min a to remain seated    Standing balance support: Bilateral upper extremity supported Standing balance-Leahy Scale: Poor Standing balance comment: Unable to maintain balance without +2 assist. Appeared to be weightbearing on the left                              Pertinent Vitals/Pain Pain Assessment: Faces Faces Pain Scale: Hurts little more Pain Location: Left foot  Pain Intervention(s): Limited activity within patient's tolerance;Monitored during session    Home [name] Family/patient expects to be discharged to:: Private residence [name] Arrangements: Other relatives Available Help at Discharge: Family Type of Home: Apartment Home Access: Stairs to enter   Technical brewer of Steps: 3     Additional Comments: Has a wheelchair at home     Prior Function Level of Independence: Needs  assistance   Gait / Transfers Assistance Needed: requires 1-2 person assist short distances and wheelchair level for most part  ADL's / Homemaking Assistance Needed: pt requires total care   Comments: Sister provides 24 hour care      Hand Dominance        Extremity/Trunk Assessment   Upper Extremity Assessment: Generalized weakness           Lower Extremity  Assessment: Generalized weakness         Communication      Cognition Arousal/Alertness: Awake/alert Behavior During Therapy: Flat affect Overall Cognitive Status: Within Functional Limits for tasks assessed                      General Comments      Exercises     Assessment/Plan    PT Assessment Patient needs continued PT services  PT Problem List Decreased strength;Decreased range of motion;Decreased activity tolerance;Decreased balance;Decreased mobility;Decreased coordination;Decreased knowledge of precautions;Obesity;Impaired sensation;Pain;Decreased skin integrity          PT Treatment Interventions Gait training;Stair training;Functional mobility training;Therapeutic exercise;Therapeutic activities;Balance training;Neuromuscular re-education;Patient/family education;Wheelchair mobility training;Modalities    PT Goals (Current goals can be found in the Care Plan section)  Acute Rehab PT Goals Patient Stated Goal: to go home  PT Goal Formulation: With patient/family Time For Goal Achievement: 05/07/16 Potential to Achieve Goals: Poor    Frequency Min 5X/week   Barriers to discharge Inaccessible home environment Patient has 3 steps to hget into her house. Her sister reports they will pull her in the wheelchair up the steps. She is unable to follow percuations with +2 assist. Her sister will be primarily taking care of her at home.     Co-evaluation               End of Session Equipment Utilized During Treatment: Gait belt Activity Tolerance: Patient limited by fatigue;Patient limited by pain Patient left: in chair (Sister reuested patient stay in wheelchair. Foot propped.) Nurse Communication: Mobility status;Precautions (Nursing notified the patient was in the wheelchair. )         Time: [id number] PT Time Calculation (min) (ACUTE ONLY): 25 min   Charges:   PT Evaluation $PT Eval Moderate Complexity: 1 Procedure     PT G Codes:         Debbie [name] 04/23/2016, 12:03 PM

## 2016-04-23 NOTE — Care Management Note (Addendum)
Case Management Note  Patient Details  Name: Sakita Alligood MRN: [id number] Date of Birth: 12/13/1960  Subjective/Objective:                  metatarsal amputation of foot, left [facility]) Action/Plan: Discharge planning Expected Discharge Date:  04/23/16               Expected Discharge Plan:  [place]  In-House Referral:     Discharge planning Services  CM Consult  Post Acute Care Choice:  Durable Medical Equipment, Home Health Choice offered to:  Patient  DME Arranged: shower stool, rollator, wheelchair, gel overlay DME Agency:  [place]:  RN, PT [facility] Agency:  [place]  Status of Service:  Completed, signed off  If discussed at [place] of Stay Meetings, dates discussed:    Additional Comments: 04/24/16 PTAR information at [place] with PTAR number for discharge when pt is ready; RN aware.  NO other CM needs were communicated. CM received call from RN to please arrange for HHPT/RN and DME.  Pt needing gel overlay, wheelchair, rollator, shower stool, 3n1; CM notified [facility] Reggie to please arrange once MD has signed off on orders.  CM has requested RN to get MD to sign off on DME orders and place face to face and HHRN/PT orders.  CM monitoring for orders to be completed and will set up PTAR.    Dellie Catholic, RN 04/23/2016, 12:22 PM

## 2016-04-23 NOTE — Discharge Instructions (Signed)
Keep your left foot dressing clean and dry.

## 2016-04-23 NOTE — NC FL2 (Signed)
MEDICAID FL2 LEVEL OF CARE SCREENING TOOL     IDENTIFICATION  Patient Name: Debbie [name] Birthdate: [birth year] Sex: female Admission Date (Current Location): 04/22/2016  [facility] and [place] Number:  Herbalist and Address:  [facility] [place]. [facility], [place] [address] [place], [place] [address]      Provider Number: [id number]  Attending Physician Name and Address:  Newt Minion, MD  Relative Name and Phone Number:       Current Level of Care: Hospital Recommended Level of Care: [place] Prior Approval Number:    Date Approved/Denied:   PASRR Number:    Discharge Plan: SNF    Current Diagnoses: Patient Active Problem List   Diagnosis Date Noted  . S/P transmetatarsal amputation of foot, left ([place]) 04/22/2016  . Postmenopausal bleeding 03/02/2016  . AKI (acute kidney injury) ([place]) 02/27/2016  . Acute blood loss anemia   . Cellulitis of left toe   . Language barrier affecting health care   . Vaginal bleeding 02/25/2016  . Pain and swelling of toe of left foot   . Cellulitis of toe of left foot   . Atherosclerotic peripheral vascular disease with rest pain ([place]) 01/22/2016  . PAD (peripheral artery disease) ([place]) 11/05/2015  . Hypoglycemia 04/13/2015  . Chronic pain syndrome 02/05/2015  . Fall at home 02/05/2015  . PTSD (post-traumatic stress disorder) 01/09/2014  . Fungal dermatitis 01/09/2014  . Essential hypertension 01/09/2014  . Generalized anxiety disorder 12/13/2012  . Polyneuropathy in diabetes(357.2) 12/13/2012  . Diabetic retinopathy ([place]) 12/12/2012  . Diabetic foot ulcers ([place]) 12/05/2012  . Diabetic foot infection ([place]) 11/30/2012  . Unspecified hypothyroidism 11/30/2012  . Adjustment disorder with mixed anxiety and depressed mood 11/10/2012  . Diabetes mellitus type 2 with peripheral artery disease ([place]) 11/09/2012    Orientation RESPIRATION BLADDER Height & Weight     Self, Time, Situation, Place  Normal Continent Weight: 230 lb (104.3 kg) Height:  5\' 5"  (165.1 cm)  BEHAVIORAL SYMPTOMS/MOOD NEUROLOGICAL BOWEL NUTRITION STATUS   (none)  (none) Continent Diet (Regular)  AMBULATORY STATUS COMMUNICATION OF NEEDS Skin   Extensive Assist Verbally Surgical wounds                       Personal Care Assistance Level of Assistance  Bathing, Feeding, Dressing Bathing Assistance: Limited assistance Feeding assistance: Independent Dressing Assistance: Limited assistance     Functional Limitations Info  Sight, Hearing, Speech Sight Info: Adequate Hearing Info: Adequate Speech Info: Adequate    SPECIAL CARE FACTORS FREQUENCY  PT (By licensed PT)     PT Frequency:  (5x/week)              Contractures Contractures Info: Not present    Additional Factors Info  Code Status, Allergies Code Status Info:  (Full) Allergies Info:  (Penicillins, Hydrocodone, Hydrocortisone, Latex, Tylenol With Codeine #3 Acetaminophen-codeine)           Current Medications (04/23/2016):  This is the current hospital active medication list Current Facility-Administered Medications  Medication Dose Route Frequency Provider Last Rate Last Dose  . 0.9 %  sodium chloride infusion   Intravenous Continuous Newt Minion, MD      . acetaminophen (TYLENOL) tablet 650 mg  650 mg Oral Q6H PRN Newt Minion, MD       Or  . acetaminophen (TYLENOL) suppository 650 mg  650 mg Rectal Q6H PRN Newt Minion, MD      .  aspirin EC tablet 81 mg  81 mg Oral Daily Newt Minion, MD   81 mg at 04/23/16 0955  . DULoxetine (CYMBALTA) DR capsule 30 mg  30 mg Oral Daily Newt Minion, MD   30 mg at 04/23/16 0954  . gabapentin (NEURONTIN) capsule 300 mg  300 mg Oral TID Newt Minion, MD   300 mg at 04/23/16 0955  . HYDROmorphone (DILAUDID) injection 1 mg  1 mg Intravenous Q2H PRN Newt Minion, MD      . insulin aspart (novoLOG) injection 0-15 Units  0-15 Units Subcutaneous TID WC Newt Minion, MD   3 Units at  04/22/16 1800  . insulin aspart (novoLOG) injection 4 Units  4 Units Subcutaneous TID WC Newt Minion, MD   4 Units at 04/23/16 [phone]  . insulin glargine (LANTUS) injection 10 Units  10 Units Subcutaneous QHS Newt Minion, MD   10 Units at 04/22/16 2153  . linagliptin (TRADJENTA) tablet 5 mg  5 mg Oral Daily Newt Minion, MD   5 mg at 04/23/16 0955  . loteprednol (LOTEMAX) 0.5 % ophthalmic suspension 1 drop  1 drop Both Eyes BID Newt Minion, MD   1 drop at 04/23/16 0955  . megestrol (MEGACE) tablet 40 mg  40 mg Oral Daily Newt Minion, MD   40 mg at 04/23/16 0955  . methocarbamol (ROBAXIN) tablet 500 mg  500 mg Oral Q6H PRN Newt Minion, MD       Or  . methocarbamol (ROBAXIN) 500 mg in dextrose 5 % 50 mL IVPB  500 mg Intravenous Q6H PRN Newt Minion, MD      . metoCLOPramide (REGLAN) tablet 5-10 mg  5-10 mg Oral Q8H PRN Newt Minion, MD       Or  . metoCLOPramide (REGLAN) injection 5-10 mg  5-10 mg Intravenous Q8H PRN Newt Minion, MD      . ondansetron [facility]) tablet 4 mg  4 mg Oral Q6H PRN Newt Minion, MD       Or  . ondansetron [facility]) injection 4 mg  4 mg Intravenous Q6H PRN Newt Minion, MD      . oxyCODONE (Oxy IR/ROXICODONE) immediate release tablet 5-10 mg  5-10 mg Oral Q3H PRN Newt Minion, MD   10 mg at 04/23/16 0954  . propranolol ER (INDERAL LA) 24 hr capsule 60 mg  60 mg Oral Daily Newt Minion, MD   60 mg at 04/23/16 [id number]     Discharge Medications: Please see discharge summary for a list of discharge medications.  Relevant Imaging Results:  Relevant Lab Results:   Additional Information SS#: [id number]  Junie Spencer, LCSW

## 2016-04-23 NOTE — Discharge Summary (Signed)
Patient ID: Debbie [name] MRN: [id number] DOB/AGE: 1960-08-07 55 y.o.  Admit date: 04/22/2016 Discharge date: 04/23/2016  Admission Diagnoses:  Active Problems:   S/P transmetatarsal amputation of foot, left [facility])   Discharge Diagnoses:  Same  Past Medical History:  Diagnosis Date  . Anemia   . Blind   . Cataract   . Chest pain    a. ? MI in 2010, [place]->medically managed  . Chronic kidney disease    acute renal disease - 02/2016- "from bleeding so much"  . Depression   . Diabetic foot ulcer ([place])   . History of blood transfusion    02/2016 abnormal bleeding vagina  . Hypertension    x ~ 10 years  . Hypothyroidism    no medication  . Neuropathy ([place])   . Obesity   . Shortness of breath dyspnea    with movement  . Type II or unspecified type diabetes mellitus with unspecified complication, uncontrolled    x ~ 10 years    Surgeries: Procedure(s): Left Transmetatarsal Amputation on 04/22/2016   Consultants:   Discharged Condition: Improved  Hospital Course: Debbie [name] is an 55 y.o. female who was admitted 04/22/2016 for operative treatment of<principal problem not specified>. Patient has severe unremitting pain that affects sleep, daily activities, and work/hobbies. After pre-op clearance the patient was taken to the operating room on 04/22/2016 and underwent  Procedure(s): Left Transmetatarsal Amputation.    Patient was given perioperative antibiotics: Anti-infectives    Start     Dose/Rate Route Frequency Ordered Stop   04/22/16 1800  clindamycin (CLEOCIN) IVPB 600 mg     600 mg 100 mL/hr over 30 Minutes Intravenous Every 6 hours 04/22/16 1558 04/23/16 0547   04/22/16 1200  clindamycin (CLEOCIN) IVPB 900 mg     900 mg 100 mL/hr over 30 Minutes Intravenous To ShortStay Surgical 04/21/16 1353 04/22/16 1244       Patient was given sequential compression devices, early ambulation, and chemoprophylaxis to prevent DVT.  Patient benefited maximally from  hospital stay and there were no complications.    Recent vital signs: Patient Vitals for the past 24 hrs:  BP Temp Temp src Pulse Resp SpO2 Height Weight  04/23/16 0628 (!) 100/54 98.4 F (36.9 C) Oral 93 16 98 % - -  04/22/16 2053 (!) 118/59 98.2 F (36.8 C) Oral 84 15 99 % - -  04/22/16 1554 (!) 110/50 97.8 F (36.6 C) Oral 85 16 99 % - -  04/22/16 1537 (!) 111/56 98.8 F (37.1 C) - 86 15 100 % - -  04/22/16 1525 112/60 - - 88 14 99 % - -  04/22/16 1520 112/64 - - 88 18 99 % - -  04/22/16 1510 103/61 - - 92 19 99 % - -  04/22/16 1505 (!) 105/54 - - 87 15 98 % - -  04/22/16 1455 (!) 92/56 - - 89 16 96 % - -  04/22/16 1450 (!) 86/52 - - 92 16 97 % - -  04/22/16 1445 (!) 76/46 - - 92 17 97 % - -  04/22/16 1440 (!) 82/50 - - 91 13 96 % - -  04/22/16 1435 (!) 83/49 - - 92 15 96 % - -  04/22/16 1425 (!) 77/44 - - 95 12 97 % - -  04/22/16 1410 (!) 89/51 - - 97 14 96 % - -  04/22/16 1355 (!) 98/57 - - 98 12 94 % - -  04/22/16 1340 104/62 97.7 F (36.5  C) - 99 (!) 22 99 % - -  04/22/16 1325 (!) 110/51 - - (!) 101 (!) 24 100 % - -  04/22/16 0940 (!) 158/65 98.3 F (36.8 C) Oral (!) 102 18 100 % 5\' 5"  (1.651 m) 104.3 kg (230 lb)     Recent laboratory studies:  Recent Labs  04/22/16 1045  WBC 14.3*  HGB 11.5*  HCT 36.0  PLT 296  NA 140  K 5.1  CL 107  CO2 25  BUN 29*  CREATININE 0.96  GLUCOSE 189*  CALCIUM 9.3     Discharge Medications:     Medication List    TAKE these medications   accu-chek soft touch lancets Use as instructed What changed:  how much to take  how to take this  when to take this  additional instructions   aspirin 81 MG EC tablet Take 1 tablet (81 mg total) by mouth daily.   atorvastatin 40 MG tablet Commonly known as:  LIPITOR Take 1 tablet (40 mg total) by mouth daily at 8 pm. What changed:  when to take this   clotrimazole 1 % cream Commonly known as:  LOTRIMIN APPLY TO AFFECTED AREAS TWICE DAILY What changed:  how much to  take  how to take this  when to take this  additional instructions   diphenhydrAMINE 25 MG tablet Commonly known as:  BENADRYL Take 1 tablet (25 mg total) by mouth every 6 (six) hours as needed. What changed:  reasons to take this   DULoxetine 30 MG capsule Commonly known as:  CYMBALTA Take 1 capsule (30 mg total) by mouth daily.   gabapentin 300 MG capsule Commonly known as:  NEURONTIN Take 1 capsule (300 mg total) by mouth 3 (three) times daily. What changed:  when to take this   glucose blood test strip Commonly known as:  ACCU-CHEK AVIVA PLUS Check blood sugar 3 times daily. What changed:  how much to take  how to take this  when to take this  additional instructions   insulin aspart 100 UNIT/ML injection Commonly known as:  NOVOLOG SEE NOTES Dispense QH 1 month What changed:  how much to take  how to take this  when to take this  additional instructions   insulin glargine 100 UNIT/ML injection Commonly known as:  LANTUS Inject 0.2 mLs (20 Units total) into the skin at bedtime.   loteprednol 0.5 % ophthalmic suspension Commonly known as:  LOTEMAX Place 1 drop into both eyes 2 (two) times daily.   megestrol 40 MG tablet Commonly known as:  MEGACE Take 1 tablet (40 mg total) by mouth 2 (two) times daily. Reduce to once daily one week after bleeding stops What changed:  when to take this  additional instructions   oxyCODONE-acetaminophen 5-325 MG tablet Commonly known as:  ROXICET Take 1 tablet by mouth every 4 (four) hours as needed for severe pain. What changed:  Another medication with the same name was added. Make sure you understand how and when to take each.   oxyCODONE-acetaminophen 5-325 MG tablet Commonly known as:  ROXICET Take 1 tablet by mouth every 4 (four) hours as needed for severe pain. What changed:  You were already taking a medication with the same name, and this prescription was added. Make sure you understand how and when  to take each.   propranolol ER 60 MG 24 hr capsule Commonly known as:  INDERAL LA Take 1 capsule (60 mg total) by mouth daily.   saxagliptin  HCl 2.5 MG Tabs tablet Commonly known as:  ONGLYZA Take 2.5 mg by mouth 2 (two) times daily.   senna 8.6 MG Tabs tablet Commonly known as:  SENOKOT Take 2 tablets (17.2 mg total) by mouth at bedtime as needed (for constipation).   triamcinolone cream 0.1 % Commonly known as:  KENALOG Apply 1 application topically 2 (two) times daily. What changed:  when to take this  reasons to take this       Diagnostic Studies: No results found.  Disposition: 01-Home or Self Care  Discharge Instructions    Call MD / Call 911    Complete by:  As directed    If you experience chest pain or shortness of breath, CALL 911 and be transported to the hospital emergency room.  If you develope a fever above 101 F, pus (white drainage) or increased drainage or redness at the wound, or calf pain, call your surgeon's office.   Call MD / Call 911    Complete by:  As directed    If you experience chest pain or shortness of breath, CALL 911 and be transported to the hospital emergency room.  If you develope a fever above 101 F, pus (white drainage) or increased drainage or redness at the wound, or calf pain, call your surgeon's office.   Constipation Prevention    Complete by:  As directed    Drink plenty of fluids.  Prune juice may be helpful.  You may use a stool softener, such as Colace (over the counter) 100 mg twice a day.  Use MiraLax (over the counter) for constipation as needed.   Constipation Prevention    Complete by:  As directed    Drink plenty of fluids.  Prune juice may be helpful.  You may use a stool softener, such as Colace (over the counter) 100 mg twice a day.  Use MiraLax (over the counter) for constipation as needed.   Diet - low sodium heart healthy    Complete by:  As directed    Diet - low sodium heart healthy    Complete by:  As directed     Discharge patient    Complete by:  As directed    Increase activity slowly as tolerated    Complete by:  As directed    Increase activity slowly as tolerated    Complete by:  As directed    Touch down weight bearing    Complete by:  As directed    Laterality:  left   Extremity:  Lower      Follow-up Information    Newt Minion, MD Follow up in 1 week(s).   Specialty:  Orthopedic Surgery Contact information: [place] [address] [phone]            Signed: Mcarthur Rossetti 04/23/2016, 9:28 AM

## 2016-04-23 NOTE — Progress Notes (Signed)
Patient ID: Debbie [name], female   DOB: [birth year], 55 y.o.   MRN: [id number] No acute changes.  Vitals stable.  Seemed to tolerate surgery well yesterday.  Her left foot dressing is clean and dry.  Can discharge to home this afternoon.

## 2016-04-24 ENCOUNTER — Encounter (HOSPITAL_COMMUNITY): Payer: Self-pay | Admitting: Orthopedic Surgery

## 2016-04-24 DIAGNOSIS — E1152 Type 2 diabetes mellitus with diabetic peripheral angiopathy with gangrene: Secondary | ICD-10-CM | POA: Diagnosis not present

## 2016-04-24 LAB — GLUCOSE, CAPILLARY
GLUCOSE-CAPILLARY: 143 mg/dL — AB (ref 65–99)
Glucose-Capillary: 133 mg/dL — ABNORMAL HIGH (ref 65–99)

## 2016-04-24 NOTE — Progress Notes (Signed)
Patient ID: Debbie [name], female   DOB: 05/11/1961, 55 y.o.   MRN: [id number] Ended up staying yesterday due to light-headedness prior to discharge.  Vitals stable this am and looks good overall.  Can attempt discharge again today.

## 2016-04-24 NOTE — Progress Notes (Signed)
Physical Therapy Treatment Patient Details Name: Debbie [name] MRN: [id number] DOB: 09-26-1960 Today's Date: 04/24/2016    History of Present Illness Patient had a L metatarsel amputation on  04/22/2016. She has also had a right mid-foot amputation as well. PMH includes: blindness hypothyroid, HTN, Diabetic foot ulcer. neuropathy    PT Comments    Patient did better with transfers today.  Still requires assistance for all mobility and it sounds as if this is her baseline.  Patient was NOT able to maintain TDWB during transfers even with repeated education.  Feel is patient is essentially at her baseline, she can discharge home with family assist, noting that she will be non-compliant with TDWB.       Follow Up Recommendations  Home health PT;Supervision for mobility/OOB (assistance required for mobility)     Equipment Recommendations  Rolling walker with 5" wheels;3in1 (PT)    Recommendations for Other Services       Precautions / Restrictions Precautions Precautions: Fall Restrictions LLE Weight Bearing: Touchdown weight bearing    Mobility  Bed Mobility Overal bed mobility: Needs Assistance Bed Mobility: Supine to Sit     Supine to sit: Min assist     General bed mobility comments: min assist to raise shoulders off bed and scoot to edge of bed.  Transfers Overall transfer level: Needs assistance Equipment used: Rolling walker (2 wheeled) Transfers: Sit to/from [facility] Sit to Stand: Min assist Stand pivot transfers: Min assist       General transfer comment: patient required assistance for balance and direction due to blindness.  Patient unable to maintain TDWB even with repeated education.   Sister in room and observed treatment.    Ambulation/Gait                 Stairs         General stair comments: family member states she takes patient up/down stairs in w/c and does not need to practice this.  Wheelchair Mobility     Modified Rankin (Stroke Patients Only)       Balance Overall balance assessment: Needs assistance Sitting-balance support: No upper extremity supported;Feet supported Sitting balance-Leahy Scale: Fair     Standing balance support: Bilateral upper extremity supported;During functional activity Standing balance-Leahy Scale: Poor Standing balance comment: reliant on RW and assistance for balance.                      Cognition Arousal/Alertness: Awake/alert Behavior During Therapy: Flat affect Overall Cognitive Status: Within Functional Limits for tasks assessed                      Exercises      General Comments General comments (skin integrity, edema, etc.): Used interpreter line throughout session.  Interpreter number:  [id number]      Pertinent Vitals/Pain Pain Assessment: No/denies pain    Home Living                      Prior Function            PT Goals (current goals can now be found in the care plan section) Progress towards PT goals: Progressing toward goals    Frequency    Min 4X/week      PT Plan Discharge plan needs to be updated    Co-evaluation             End of Session   Activity Tolerance: Patient tolerated  treatment well;No increased pain;Patient limited by fatigue Patient left: in chair;with call bell/phone within reach;with family/visitor present     Time: [id number] PT Time Calculation (min) (ACUTE ONLY): 28 min  Charges:  $Therapeutic Activity: 23-37 mins                    G Codes:      Shanna Cisco 04-30-2016, 11:33 AM  2016/04/30 [place], [place]

## 2016-04-24 NOTE — Progress Notes (Signed)
Discharge instructions and prescriptions provided to patient. Patient without questions at time of discharge.  IV removed.  PTAR called for transport.  Sister has wheelchair; informed wheelchair will not be able to be transported via [place].  Family are awaiting patient arrival.

## 2016-04-27 ENCOUNTER — Ambulatory Visit (INDEPENDENT_AMBULATORY_CARE_PROVIDER_SITE_OTHER): Payer: Medicaid Other | Admitting: Orthopedic Surgery

## 2016-04-27 DIAGNOSIS — Z89432 Acquired absence of left foot: Secondary | ICD-10-CM

## 2016-04-27 NOTE — Addendum Note (Signed)
Addendum  created 04/27/16 1717 by Reginal Lutes, MD   Sign clinical note

## 2016-05-02 ENCOUNTER — Other Ambulatory Visit: Payer: Self-pay | Admitting: Internal Medicine

## 2016-05-04 ENCOUNTER — Ambulatory Visit (INDEPENDENT_AMBULATORY_CARE_PROVIDER_SITE_OTHER): Payer: Medicaid Other | Admitting: Family

## 2016-05-04 ENCOUNTER — Encounter (INDEPENDENT_AMBULATORY_CARE_PROVIDER_SITE_OTHER): Payer: Self-pay | Admitting: Family

## 2016-05-04 DIAGNOSIS — E11628 Type 2 diabetes mellitus with other skin complications: Secondary | ICD-10-CM

## 2016-05-04 DIAGNOSIS — E1169 Type 2 diabetes mellitus with other specified complication: Secondary | ICD-10-CM

## 2016-05-04 DIAGNOSIS — E1151 Type 2 diabetes mellitus with diabetic peripheral angiopathy without gangrene: Secondary | ICD-10-CM

## 2016-05-04 DIAGNOSIS — Z89432 Acquired absence of left foot: Secondary | ICD-10-CM

## 2016-05-04 DIAGNOSIS — I70229 Atherosclerosis of native arteries of extremities with rest pain, unspecified extremity: Secondary | ICD-10-CM

## 2016-05-04 DIAGNOSIS — L03116 Cellulitis of left lower limb: Secondary | ICD-10-CM

## 2016-05-04 DIAGNOSIS — L089 Local infection of the skin and subcutaneous tissue, unspecified: Secondary | ICD-10-CM

## 2016-05-04 MED ORDER — DOXYCYCLINE HYCLATE 100 MG PO TABS: 100.0000 mg | ORAL_TABLET | Freq: Two times a day (BID) | ORAL | 0 refills | Status: DC

## 2016-05-04 NOTE — Progress Notes (Deleted)
Post-Op Visit Note   Patient: Debbie [name]           Date of Birth: Oct 17, 1960           MRN: [id number] Visit Date: 05/04/2016 PCP: Angelica Chessman, MD   Assessment & Plan:  Chief Complaint:  Chief Complaint  Patient presents with  . Left Foot - Routine Post Op  . Follow-up    left transmetatarsal amputation on 04/22/16   Visit Diagnoses: No diagnosis found.  Plan: ***  Follow-Up Instructions: No Follow-up on file.   Orders:  No orders of the defined types were placed in this encounter.  No orders of the defined types were placed in this encounter.    PMFS History: Patient Active Problem List   Diagnosis Date Noted  . S/P transmetatarsal amputation of foot, left ([place]) 04/22/2016  . Postmenopausal bleeding 03/02/2016  . AKI (acute kidney injury) ([place]) 02/27/2016  . Acute blood loss anemia   . Cellulitis of left toe   . Language barrier affecting health care   . Vaginal bleeding 02/25/2016  . Pain and swelling of toe of left foot   . Cellulitis of toe of left foot   . Atherosclerotic peripheral vascular disease with rest pain ([place]) 01/22/2016  . PAD (peripheral artery disease) ([place]) 11/05/2015  . Hypoglycemia 04/13/2015  . Chronic pain syndrome 02/05/2015  . Fall at home 02/05/2015  . PTSD (post-traumatic stress disorder) 01/09/2014  . Fungal dermatitis 01/09/2014  . Essential hypertension 01/09/2014  . Generalized anxiety disorder 12/13/2012  . Polyneuropathy in diabetes(357.2) 12/13/2012  . Diabetic retinopathy ([place]) 12/12/2012  . Diabetic foot ulcers ([place]) 12/05/2012  . Diabetic foot infection ([place]) 11/30/2012  . Unspecified hypothyroidism 11/30/2012  . Adjustment disorder with mixed anxiety and depressed mood 11/10/2012  . Diabetes mellitus type 2 with peripheral artery disease ([place]) 11/09/2012   Past Medical History:  Diagnosis Date  . Anemia   . Blind   . Cataract   . Chest pain    a. ? MI in 2010, [place]->medically managed  . Chronic  kidney disease    acute renal disease - 02/2016- "from bleeding so much"  . Depression   . Diabetic foot ulcer ([place])   . History of blood transfusion    02/2016 abnormal bleeding vagina  . Hypertension    x ~ 10 years  . Hypothyroidism    no medication  . Neuropathy ([place])   . Obesity   . Shortness of breath dyspnea    with movement  . Type II or unspecified type diabetes mellitus with unspecified complication, uncontrolled    x ~ 10 years    Family History  Problem Relation Age of Onset  . Diabetes Father     died in his 50's?/died in her 55's?  . Diabetes Sister     alive and well.    Past Surgical History:  Procedure Laterality Date  . AMPUTATION Left 04/22/2016   Procedure: Left Transmetatarsal Amputation;  Surgeon: Newt Minion, MD;  Location: [facility];  Service: Orthopedics;  Laterality: Left;  . AMPUTATION TOE Left 03/15/2016   Procedure: AMPUTATION LEFT GREAT TOE AT METATARSOPHALANGEAL JOINT;  Surgeon: Newt Minion, MD;  Location: [facility];  Service: Orthopedics;  Laterality: Left;  . IR GENERIC HISTORICAL  01/28/2016   IR RADIOLOGIST EVAL & MGMT 01/28/2016 Jacqulynn Cadet, MD [facility] INTERV RAD  . PERIPHERAL VASCULAR CATHETERIZATION N/A 02/01/2016   Procedure: Abdominal Aortogram w/Lower Extremity;  Surgeon: Conrad [place], MD;  Location: [facility]  INVASIVE CV LAB;  Service: Cardiovascular;  Laterality: N/A;  . TUBAL LIGATION     Social History   Occupational History  . Not on file.   Social History Main Topics  . Smoking status: Never Smoker  . Smokeless tobacco: Never Used  . Alcohol use No  . Drug use: No  . Sexual activity: Not on file

## 2016-05-04 NOTE — Progress Notes (Signed)
Wound Care Note   Patient: Debbie [name]           Date of Birth: 09/29/1960           MRN: [id number]             PCP: Angelica Chessman, MD Visit Date: 05/04/2016   Assessment & Plan: Visit Diagnoses:  1. S/P transmetatarsal amputation of foot, left ([place])   2. Cellulitis of foot, left   3. Diabetes mellitus type 2 with peripheral artery disease ([place])   4. Atherosclerotic peripheral vascular disease with rest pain ([place])   5. Diabetic foot infection ([place])     Plan: Have recommended she continue elevate the foot. Remain nonweight bearing. Continue daily wound cleansing and dry dressings. Will send in prescription for Doxycycline. Follow up in office in 1 week. Will call or return sooner should any concerns arise.   Follow-Up Instructions: Return in about 1 week (around 05/11/2016) for thurs or fri.  Orders:  No orders of the defined types were placed in this encounter.  Meds ordered this encounter  Medications  . doxycycline (VIBRA-TABS) 100 MG tablet    Sig: Take 1 tablet (100 mg total) by mouth 2 (two) times daily.    Dispense:  60 tablet    Refill:  0      Procedures: No notes on file   Clinical Data: No additional findings.   No images are attached to the encounter.   Subjective: Chief Complaint  Patient presents with  . Left Foot - Routine Post Op  . Follow-up    left transmetatarsal amputation on 04/22/16    Patient is here for follow up on left transmetatarsal amputation on 04/22/16. She is currently 12 days post op. She is accompanied with interpreter and family today. She is having increase in pain today at residual limb. She is currently taking oxycodone 5/325 for pain management.There is moderate amount of bloody drainage, despite's patient's non-weightbearing status. Her family states she is compliant with weight-bearing. Patients sutures are intact, there is slight gapping of incision. The home health nurse advised her that surgical site was looking  worse.   Complaining of pain control issues. Does not take the Percocet scheduled. Is concerned about wound infection as the home health nurse was concerned about the incision.   Review of Systems  Miscellaneous:  -Home Health Care: twice a week  -Physical Therapy: patient nonweightbearing left lower extremity  -Out of Work?: na    Objective: Vital Signs: LMP 03/15/2016   Physical Exam: Moderate swelling to the foot. Incision is approximated with sutures. There is no gaping. Is slowly healing. There is some dried blood along incision. No drainage. No maceration. There is some surrounding erythema. No ascending cellulitis.   Specialty Comments: No specialty comments available.   PMFS History: Patient Active Problem List   Diagnosis Date Noted  . S/P transmetatarsal amputation of foot, left ([place]) 04/22/2016  . Postmenopausal bleeding 03/02/2016  . AKI (acute kidney injury) ([place]) 02/27/2016  . Acute blood loss anemia   . Cellulitis of left toe   . Language barrier affecting health care   . Vaginal bleeding 02/25/2016  . Pain and swelling of toe of left foot   . Cellulitis of toe of left foot   . Atherosclerotic peripheral vascular disease with rest pain ([place]) 01/22/2016  . PAD (peripheral artery disease) ([place]) 11/05/2015  . Hypoglycemia 04/13/2015  . Chronic pain syndrome 02/05/2015  . Fall at home 02/05/2015  . PTSD (post-traumatic  stress disorder) 01/09/2014  . Fungal dermatitis 01/09/2014  . Essential hypertension 01/09/2014  . Generalized anxiety disorder 12/13/2012  . Polyneuropathy in diabetes(357.2) 12/13/2012  . Diabetic retinopathy ([place]) 12/12/2012  . Diabetic foot ulcers ([place]) 12/05/2012  . Diabetic foot infection ([place]) 11/30/2012  . Unspecified hypothyroidism 11/30/2012  . Adjustment disorder with mixed anxiety and depressed mood 11/10/2012  . Diabetes mellitus type 2 with peripheral artery disease ([place]) 11/09/2012   Past Medical History:  Diagnosis Date  .  Anemia   . Blind   . Cataract   . Chest pain    a. ? MI in 2010, [place]->medically managed  . Chronic kidney disease    acute renal disease - 02/2016- "from bleeding so much"  . Depression   . Diabetic foot ulcer ([place])   . History of blood transfusion    02/2016 abnormal bleeding vagina  . Hypertension    x ~ 10 years  . Hypothyroidism    no medication  . Neuropathy ([place])   . Obesity   . Shortness of breath dyspnea    with movement  . Type II or unspecified type diabetes mellitus with unspecified complication, uncontrolled    x ~ 10 years    Family History  Problem Relation Age of Onset  . Diabetes Father     died in his 50's?/died in her 59's?  . Diabetes Sister     alive and well.   Past Surgical History:  Procedure Laterality Date  . AMPUTATION Left 04/22/2016   Procedure: Left Transmetatarsal Amputation;  Surgeon: Newt Minion, MD;  Location: [facility];  Service: Orthopedics;  Laterality: Left;  . AMPUTATION TOE Left 03/15/2016   Procedure: AMPUTATION LEFT GREAT TOE AT METATARSOPHALANGEAL JOINT;  Surgeon: Newt Minion, MD;  Location: [facility];  Service: Orthopedics;  Laterality: Left;  . IR GENERIC HISTORICAL  01/28/2016   IR RADIOLOGIST EVAL & MGMT 01/28/2016 Jacqulynn Cadet, MD [facility] INTERV RAD  . PERIPHERAL VASCULAR CATHETERIZATION N/A 02/01/2016   Procedure: Abdominal Aortogram w/Lower Extremity;  Surgeon: Conrad Cashiers, MD;  Location: [facility];  Service: Cardiovascular;  Laterality: N/A;  . TUBAL LIGATION     Social History   Occupational History  . Not on file.   Social History Main Topics  . Smoking status: Never Smoker  . Smokeless tobacco: Never Used  . Alcohol use No  . Drug use: No  . Sexual activity: Not on file

## 2016-05-05 ENCOUNTER — Ambulatory Visit (INDEPENDENT_AMBULATORY_CARE_PROVIDER_SITE_OTHER): Payer: Medicaid Other | Admitting: Family

## 2016-05-09 ENCOUNTER — Telehealth (INDEPENDENT_AMBULATORY_CARE_PROVIDER_SITE_OTHER): Payer: Self-pay | Admitting: Orthopedic Surgery

## 2016-05-09 NOTE — Telephone Encounter (Signed)
Chrissie from [facility] called stating that the patient has another agency coming out to the home. Patient first language is not [place] and could not understand when communicated with them at point of contact. She states that the other agency nurse came out of the weekend.  Contact Info: Chrissie [facility] [place]

## 2016-05-11 NOTE — Telephone Encounter (Signed)
noted 

## 2016-05-12 ENCOUNTER — Encounter (INDEPENDENT_AMBULATORY_CARE_PROVIDER_SITE_OTHER): Payer: Self-pay | Admitting: Orthopedic Surgery

## 2016-05-12 ENCOUNTER — Ambulatory Visit (INDEPENDENT_AMBULATORY_CARE_PROVIDER_SITE_OTHER): Payer: Medicaid Other | Admitting: Orthopedic Surgery

## 2016-05-12 DIAGNOSIS — Z89432 Acquired absence of left foot: Secondary | ICD-10-CM

## 2016-05-12 MED ORDER — NITROGLYCERIN 0.2 MG/HR TD PT24: 0.2000 mg | MEDICATED_PATCH | Freq: Every day | TRANSDERMAL | 12 refills | Status: DC

## 2016-05-12 NOTE — Progress Notes (Signed)
Wound Care Note   Patient: Debbie [name]           Date of Birth: 1960/10/05           MRN: [id number]             PCP: Angelica Chessman, MD Visit Date: 05/12/2016   Assessment & Plan: Visit Diagnoses:  1. S/P transmetatarsal amputation of foot, left ([place])     Plan: Patient has having slow healing. She had adequate macro circulation with a palpable dorsalis pedis pulse ultrasound showed adequate large vessel circulation however she is deathly having problems with her microcirculation. Patient has ischemic changes and ischemic pain along the incision is gaped open about 1 mm. Patient was given instructions to continue with her doxycycline she is given a prescription called in for nitroglycerin patch continue dialysis of cleansing daily this was discussed with the patient interpreter and the daughter all state they understand and will follow up in the office weekly.   Follow-Up Instructions: Return in about 1 week (around 05/19/2016).  Orders:  No orders of the defined types were placed in this encounter.  Meds ordered this encounter  Medications  . nitroGLYCERIN (NITRODUR - DOSED IN MG/24 HR) 0.2 mg/hr patch    Sig: Place 1 patch (0.2 mg total) onto the skin daily.    Dispense:  30 patch    Refill:  12      Procedures: No notes on file   Clinical Data: No additional findings.   No images are attached to the encounter.   Subjective: Chief Complaint  Patient presents with  . Left Foot - Routine Post Op  . Routine Post Op    transmetatarsal amputation of left foot on 04/22/16    Patient is here for follow up on left transmetatarsal amputation on 04/22/16. She is approximately 3 weeks post op. She is accompanied with interpreter and family today. She had seen Junie Panning NP at last office visit. She was written prescription for doxycycline and recommended to continue nonweightbearing. Her family reports there is still continuous bloody drainage. She continuous to be in severe  pain. There is slight gapping middle of incision. There is redness, patient's sutures are intact. Her sister is doing the majority of the dressing changes as home health is only coming once a week for wound check. They are performing dry dressing changes daily. Patient is nonweightbearing in wheelchair.     Review of Systems  Miscellaneous:  -Home Health Care: [facility] once a week for weekly wound check  -Physical Therapy: no, patient nonweightbearing left lower extremity  Objective: Vital Signs: LMP 03/15/2016   Physical Exam: Ischemic pain with gaping of the wound approximately 1 mm was serous sinus drainage  Specialty Comments: No specialty comments available.   PMFS History: Patient Active Problem List   Diagnosis Date Noted  . S/P transmetatarsal amputation of foot, left ([place]) 04/22/2016  . Postmenopausal bleeding 03/02/2016  . AKI (acute kidney injury) ([place]) 02/27/2016  . Acute blood loss anemia   . Cellulitis of left toe   . Language barrier affecting health care   . Vaginal bleeding 02/25/2016  . Pain and swelling of toe of left foot   . Cellulitis of toe of left foot   . Atherosclerotic peripheral vascular disease with rest pain ([place]) 01/22/2016  . PAD (peripheral artery disease) ([place]) 11/05/2015  . Hypoglycemia 04/13/2015  . Chronic pain syndrome 02/05/2015  . Fall at home 02/05/2015  . PTSD (post-traumatic stress disorder) 01/09/2014  .  Fungal dermatitis 01/09/2014  . Essential hypertension 01/09/2014  . Generalized anxiety disorder 12/13/2012  . Polyneuropathy in diabetes(357.2) 12/13/2012  . Diabetic retinopathy ([place]) 12/12/2012  . Diabetic foot ulcers ([place]) 12/05/2012  . Diabetic foot infection ([place]) 11/30/2012  . Unspecified hypothyroidism 11/30/2012  . Adjustment disorder with mixed anxiety and depressed mood 11/10/2012  . Diabetes mellitus type 2 with peripheral artery disease ([place]) 11/09/2012   Past Medical History:  Diagnosis Date  . Anemia   . Blind     . Cataract   . Chest pain    a. ? MI in 2010, [place]->medically managed  . Chronic kidney disease    acute renal disease - 02/2016- "from bleeding so much"  . Depression   . Diabetic foot ulcer ([place])   . History of blood transfusion    02/2016 abnormal bleeding vagina  . Hypertension    x ~ 10 years  . Hypothyroidism    no medication  . Neuropathy ([place])   . Obesity   . Shortness of breath dyspnea    with movement  . Type II or unspecified type diabetes mellitus with unspecified complication, uncontrolled    x ~ 10 years    Family History  Problem Relation Age of Onset  . Diabetes Father     died in his 50's?/died in her 65's?  . Diabetes Sister     alive and well.   Past Surgical History:  Procedure Laterality Date  . AMPUTATION Left 04/22/2016   Procedure: Left Transmetatarsal Amputation;  Surgeon: Newt Minion, MD;  Location: [name];  Service: Orthopedics;  Laterality: Left;  . AMPUTATION TOE Left 03/15/2016   Procedure: AMPUTATION LEFT GREAT TOE AT METATARSOPHALANGEAL JOINT;  Surgeon: Newt Minion, MD;  Location: [facility];  Service: Orthopedics;  Laterality: Left;  . IR GENERIC HISTORICAL  01/28/2016   IR RADIOLOGIST EVAL & MGMT 01/28/2016 Jacqulynn Cadet, MD [facility] INTERV RAD  . PERIPHERAL VASCULAR CATHETERIZATION N/A 02/01/2016   Procedure: Abdominal Aortogram w/Lower Extremity;  Surgeon: Conrad [place], MD;  Location: [facility];  Service: Cardiovascular;  Laterality: N/A;  . TUBAL LIGATION     Social History   Occupational History  . Not on file.   Social History Main Topics  . Smoking status: Never Smoker  . Smokeless tobacco: Never Used  . Alcohol use No  . Drug use: No  . Sexual activity: Not on file

## 2016-05-13 ENCOUNTER — Telehealth (INDEPENDENT_AMBULATORY_CARE_PROVIDER_SITE_OTHER): Payer: Self-pay | Admitting: Orthopedic Surgery

## 2016-05-13 NOTE — Telephone Encounter (Signed)
Donita O'Dom (nurse) with [facility] called needing treatment orders for surgical site for the patient.  The number to contact her is [phone]

## 2016-05-13 NOTE — Telephone Encounter (Signed)
I called spoke with Donita to advise that nitroglycerin patches prescribed at last office visit.  She should continue with her antibiotic, and she should continue with dry dressing changes.

## 2016-05-19 ENCOUNTER — Encounter (INDEPENDENT_AMBULATORY_CARE_PROVIDER_SITE_OTHER): Payer: Self-pay | Admitting: Orthopedic Surgery

## 2016-05-19 ENCOUNTER — Other Ambulatory Visit (INDEPENDENT_AMBULATORY_CARE_PROVIDER_SITE_OTHER): Payer: Self-pay | Admitting: Family

## 2016-05-19 ENCOUNTER — Ambulatory Visit (INDEPENDENT_AMBULATORY_CARE_PROVIDER_SITE_OTHER): Payer: Medicaid Other | Admitting: Orthopedic Surgery

## 2016-05-19 DIAGNOSIS — T8131XD Disruption of external operation (surgical) wound, not elsewhere classified, subsequent encounter: Secondary | ICD-10-CM

## 2016-05-19 DIAGNOSIS — Z89432 Acquired absence of left foot: Secondary | ICD-10-CM

## 2016-05-19 NOTE — Progress Notes (Signed)
Wound Care Note   Patient: Debbie [name]           Date of Birth: 08/06/1960           MRN: [id number]             PCP: Angelica Chessman, MD Visit Date: 05/19/2016   Assessment & Plan: Visit Diagnoses:  1. Dehiscence of operative wound, subsequent encounter   2. S/P transmetatarsal amputation of foot, left ([place])     Plan: Discussed with the patient and her sister and an interpreter that I would recommend proceeding with a transtibial amputation. Patient has dehiscence of the transmetatarsal amputation. She has a strong biphasic dorsalis pedis pulse and has a palpable dorsalis pedis pulse and I am concerned that her microcirculation is the overriding problem not the microcirculation. Patient and her sister do not want a consider a transtibial amputation and wish to proceed with revision of the transmetatarsal amputation. Discussed that there is a 50-50 chance of the incision not healing they state they understand and wish to proceed at this time.   Follow-Up Instructions: Return in about 2 weeks (around 06/02/2016).  Orders:  No orders of the defined types were placed in this encounter.  No orders of the defined types were placed in this encounter.     Procedures: No notes on file   Clinical Data: No additional findings.   No images are attached to the encounter.   Subjective: Chief Complaint  Patient presents with  . Left Foot - Follow-up, Routine Post Op    Left transmetatarsal amputation 05/12/16    Patient presents for follow up today for left transmetatarsal amputation 04/22/16. Patient is 6 weeks post op. She is complaining of persistent pain. Sutures are intact. There is bloody drainage. Patient is nonweightbearing with wheelchair. She continues with nitroglycerin patches but feels she still has no improvement.     Review of Systems  Miscellaneous:  -Home Health Care: advanced home care weekly wound care  -Physical Therapy: no nonweightbearing left lower  extremity.    Objective: Vital Signs: LMP  (LMP Unknown)   Physical Exam: Patient has ischemic pain at the transmetatarsal amputation with wound dehiscence this probes down to bone. There is no purulence no cellulitis no signs of infection. A Doppler was used which showed a strong biphasic dorsalis pedis pulse she has a palpable dorsalis pedis pulse. Patient has good hair growth in her leg. There is no black gangrenous changes to the skin edges. Patient's problem is a microcirculatory problem  Specialty Comments: No specialty comments available.   PMFS History: Patient Active Problem List   Diagnosis Date Noted  . S/P transmetatarsal amputation of foot, left ([place]) 04/22/2016  . Postmenopausal bleeding 03/02/2016  . AKI (acute kidney injury) ([place]) 02/27/2016  . Acute blood loss anemia   . Cellulitis of left toe   . Language barrier affecting health care   . Vaginal bleeding 02/25/2016  . Pain and swelling of toe of left foot   . Cellulitis of toe of left foot   . Atherosclerotic peripheral vascular disease with rest pain ([place]) 01/22/2016  . PAD (peripheral artery disease) ([place]) 11/05/2015  . Hypoglycemia 04/13/2015  . Chronic pain syndrome 02/05/2015  . Fall at home 02/05/2015  . PTSD (post-traumatic stress disorder) 01/09/2014  . Fungal dermatitis 01/09/2014  . Essential hypertension 01/09/2014  . Generalized anxiety disorder 12/13/2012  . Polyneuropathy in diabetes(357.2) 12/13/2012  . Diabetic retinopathy ([place]) 12/12/2012  . Diabetic foot ulcers ([place]) 12/05/2012  .  Diabetic foot infection ([place]) 11/30/2012  . Unspecified hypothyroidism 11/30/2012  . Adjustment disorder with mixed anxiety and depressed mood 11/10/2012  . Diabetes mellitus type 2 with peripheral artery disease ([place]) 11/09/2012   Past Medical History:  Diagnosis Date  . Anemia   . Blind   . Cataract   . Chest pain    a. ? MI in 2010, [place]->medically managed  . Chronic kidney disease    acute renal  disease - 02/2016- "from bleeding so much"  . Depression   . Diabetic foot ulcer ([place])   . History of blood transfusion    02/2016 abnormal bleeding vagina  . Hypertension    x ~ 10 years  . Hypothyroidism    no medication  . Neuropathy ([place])   . Obesity   . Shortness of breath dyspnea    with movement  . Type II or unspecified type diabetes mellitus with unspecified complication, uncontrolled    x ~ 10 years    Family History  Problem Relation Age of Onset  . Diabetes Father     died in his 50's?/died in her 87's?  . Diabetes Sister     alive and well.   Past Surgical History:  Procedure Laterality Date  . AMPUTATION Left 04/22/2016   Procedure: Left Transmetatarsal Amputation;  Surgeon: Newt Minion, MD;  Location: [facility];  Service: Orthopedics;  Laterality: Left;  . AMPUTATION TOE Left 03/15/2016   Procedure: AMPUTATION LEFT GREAT TOE AT METATARSOPHALANGEAL JOINT;  Surgeon: Newt Minion, MD;  Location: [facility];  Service: Orthopedics;  Laterality: Left;  . IR GENERIC HISTORICAL  01/28/2016   IR RADIOLOGIST EVAL & MGMT 01/28/2016 Jacqulynn Cadet, MD [facility] INTERV RAD  . PERIPHERAL VASCULAR CATHETERIZATION N/A 02/01/2016   Procedure: Abdominal Aortogram w/Lower Extremity;  Surgeon: Conrad [place], MD;  Location: [facility];  Service: Cardiovascular;  Laterality: N/A;  . TUBAL LIGATION     Social History   Occupational History  . Not on file.   Social History Main Topics  . Smoking status: Never Smoker  . Smokeless tobacco: Never Used  . Alcohol use No  . Drug use: No  . Sexual activity: Not on file

## 2016-05-19 NOTE — Progress Notes (Signed)
Called pt for pre-op instructions only via [employer], Deatra Canter [phone]. Only gave instructions due to lateness of the day. Pt had surgery in October.

## 2016-05-20 ENCOUNTER — Encounter (HOSPITAL_COMMUNITY): Payer: Self-pay | Admitting: Certified Registered"

## 2016-05-20 ENCOUNTER — Encounter (HOSPITAL_COMMUNITY)
Admission: RE | Disposition: A | Discharge status: Home / Self Care | Payer: Self-pay | Source: Ambulatory Visit | Attending: Orthopedic Surgery

## 2016-05-20 ENCOUNTER — Ambulatory Visit (HOSPITAL_COMMUNITY): Payer: Medicaid Other | Admitting: Anesthesiology

## 2016-05-20 ENCOUNTER — Ambulatory Visit (HOSPITAL_COMMUNITY)
Admission: RE | Admit: 2016-05-20 | Discharge: 2016-05-22 | Disposition: A | Discharge status: Home / Self Care | Payer: Medicaid Other | Source: Ambulatory Visit | Attending: Orthopedic Surgery | Admitting: Orthopedic Surgery

## 2016-05-20 DIAGNOSIS — Z886 Allergy status to analgesic agent status: Secondary | ICD-10-CM | POA: Insufficient documentation

## 2016-05-20 DIAGNOSIS — T8781 Dehiscence of amputation stump: Secondary | ICD-10-CM | POA: Insufficient documentation

## 2016-05-20 DIAGNOSIS — I129 Hypertensive chronic kidney disease with stage 1 through stage 4 chronic kidney disease, or unspecified chronic kidney disease: Secondary | ICD-10-CM | POA: Insufficient documentation

## 2016-05-20 DIAGNOSIS — Z7982 Long term (current) use of aspirin: Secondary | ICD-10-CM | POA: Insufficient documentation

## 2016-05-20 DIAGNOSIS — E11621 Type 2 diabetes mellitus with foot ulcer: Secondary | ICD-10-CM | POA: Diagnosis not present

## 2016-05-20 DIAGNOSIS — Y835 Amputation of limb(s) as the cause of abnormal reaction of the patient, or of later complication, without mention of misadventure at the time of the procedure: Secondary | ICD-10-CM | POA: Insufficient documentation

## 2016-05-20 DIAGNOSIS — N189 Chronic kidney disease, unspecified: Secondary | ICD-10-CM | POA: Insufficient documentation

## 2016-05-20 DIAGNOSIS — Z91040 Latex allergy status: Secondary | ICD-10-CM | POA: Insufficient documentation

## 2016-05-20 DIAGNOSIS — Z880 Allergy status to penicillin: Secondary | ICD-10-CM | POA: Diagnosis not present

## 2016-05-20 DIAGNOSIS — E1151 Type 2 diabetes mellitus with diabetic peripheral angiopathy without gangrene: Secondary | ICD-10-CM | POA: Insufficient documentation

## 2016-05-20 DIAGNOSIS — Z885 Allergy status to narcotic agent status: Secondary | ICD-10-CM | POA: Insufficient documentation

## 2016-05-20 DIAGNOSIS — E1169 Type 2 diabetes mellitus with other specified complication: Secondary | ICD-10-CM | POA: Diagnosis not present

## 2016-05-20 DIAGNOSIS — E039 Hypothyroidism, unspecified: Secondary | ICD-10-CM | POA: Insufficient documentation

## 2016-05-20 DIAGNOSIS — F329 Major depressive disorder, single episode, unspecified: Secondary | ICD-10-CM | POA: Diagnosis not present

## 2016-05-20 DIAGNOSIS — Z6838 Body mass index (BMI) 38.0-38.9, adult: Secondary | ICD-10-CM | POA: Insufficient documentation

## 2016-05-20 DIAGNOSIS — E669 Obesity, unspecified: Secondary | ICD-10-CM | POA: Diagnosis not present

## 2016-05-20 DIAGNOSIS — L089 Local infection of the skin and subcutaneous tissue, unspecified: Secondary | ICD-10-CM

## 2016-05-20 DIAGNOSIS — I252 Old myocardial infarction: Secondary | ICD-10-CM | POA: Diagnosis not present

## 2016-05-20 DIAGNOSIS — D631 Anemia in chronic kidney disease: Secondary | ICD-10-CM | POA: Insufficient documentation

## 2016-05-20 DIAGNOSIS — Z833 Family history of diabetes mellitus: Secondary | ICD-10-CM | POA: Insufficient documentation

## 2016-05-20 DIAGNOSIS — Z794 Long term (current) use of insulin: Secondary | ICD-10-CM | POA: Insufficient documentation

## 2016-05-20 DIAGNOSIS — IMO0002 Reserved for concepts with insufficient information to code with codable children: Secondary | ICD-10-CM

## 2016-05-20 DIAGNOSIS — E11628 Type 2 diabetes mellitus with other skin complications: Secondary | ICD-10-CM

## 2016-05-20 DIAGNOSIS — E1140 Type 2 diabetes mellitus with diabetic neuropathy, unspecified: Secondary | ICD-10-CM | POA: Insufficient documentation

## 2016-05-20 DIAGNOSIS — E1122 Type 2 diabetes mellitus with diabetic chronic kidney disease: Secondary | ICD-10-CM | POA: Insufficient documentation

## 2016-05-20 DIAGNOSIS — Z89432 Acquired absence of left foot: Secondary | ICD-10-CM

## 2016-05-20 HISTORY — PX: APPLICATION OF WOUND VAC: SHX5189

## 2016-05-20 HISTORY — PX: AMPUTATION: SHX166

## 2016-05-20 LAB — GLUCOSE, CAPILLARY
GLUCOSE-CAPILLARY: 158 mg/dL — AB (ref 65–99)
GLUCOSE-CAPILLARY: 156 mg/dL — AB (ref 65–99)
Glucose-Capillary: 111 mg/dL — ABNORMAL HIGH (ref 65–99)
Glucose-Capillary: 162 mg/dL — ABNORMAL HIGH (ref 65–99)

## 2016-05-20 LAB — BASIC METABOLIC PANEL
ANION GAP: 7 (ref 5–15)
BUN: 24 mg/dL — ABNORMAL HIGH (ref 6–20)
CALCIUM: 8.7 mg/dL — AB (ref 8.9–10.3)
CO2: 21 mmol/L — ABNORMAL LOW (ref 22–32)
Chloride: 113 mmol/L — ABNORMAL HIGH (ref 101–111)
Creatinine, Ser: 0.93 mg/dL (ref 0.44–1.00)
Glucose, Bld: 143 mg/dL — ABNORMAL HIGH (ref 65–99)
POTASSIUM: 5.2 mmol/L — AB (ref 3.5–5.1)
SODIUM: 141 mmol/L (ref 135–145)

## 2016-05-20 LAB — CBC
HCT: 33.4 % — ABNORMAL LOW (ref 36.0–46.0)
HEMOGLOBIN: 10.9 g/dL — AB (ref 12.0–15.0)
MCH: 26.8 pg (ref 26.0–34.0)
MCHC: 32.6 g/dL (ref 30.0–36.0)
MCV: 82.1 fL (ref 78.0–100.0)
Platelets: 330 10*3/uL (ref 150–400)
RBC: 4.07 MIL/uL (ref 3.87–5.11)
RDW: 13.9 % (ref 11.5–15.5)
WBC: 11.5 10*3/uL — AB (ref 4.0–10.5)

## 2016-05-20 SURGERY — AMPUTATION, FOOT, PARTIAL
Anesthesia: Monitor Anesthesia Care | Site: Foot | Laterality: Left

## 2016-05-20 MED ORDER — LIDOCAINE 2% (20 MG/ML) 5 ML SYRINGE
INTRAMUSCULAR | Status: AC
  Filled 2016-05-20: qty 5

## 2016-05-20 MED ORDER — FENTANYL CITRATE (PF) 100 MCG/2ML IJ SOLN
INTRAMUSCULAR | Status: AC
  Filled 2016-05-20: qty 2

## 2016-05-20 MED ORDER — MEPIVACAINE HCL 1.5 % IJ SOLN
INTRAMUSCULAR | Status: DC | PRN
  Administered 2016-05-20: 15 mL via PERINEURAL

## 2016-05-20 MED ORDER — HYDROMORPHONE HCL 1 MG/ML IJ SOLN
0.2500 mg | INTRAMUSCULAR | Status: DC | PRN
  Administered 2016-05-20 (×2): 0.25 mg via INTRAVENOUS

## 2016-05-20 MED ORDER — NITROGLYCERIN 0.2 MG/HR TD PT24: 0.2000 mg | MEDICATED_PATCH | Freq: Every day | TRANSDERMAL | 12 refills | Status: AC

## 2016-05-20 MED ORDER — METOCLOPRAMIDE HCL 5 MG PO TABS: 5.0000 mg | ORAL_TABLET | Freq: Three times a day (TID) | ORAL | Status: DC | PRN

## 2016-05-20 MED ORDER — SODIUM CHLORIDE 0.9 % IV SOLN
INTRAVENOUS | Status: DC
  Administered 2016-05-20: 18:00:00 via INTRAVENOUS

## 2016-05-20 MED ORDER — LACTATED RINGERS IV SOLN
INTRAVENOUS | Status: DC
  Administered 2016-05-20: 11:00:00 via INTRAVENOUS

## 2016-05-20 MED ORDER — METOCLOPRAMIDE HCL 5 MG/ML IJ SOLN: 5.0000 mg | Freq: Three times a day (TID) | INTRAMUSCULAR | Status: DC | PRN

## 2016-05-20 MED ORDER — METHOCARBAMOL 1000 MG/10ML IJ SOLN: 500.0000 mg | Freq: Four times a day (QID) | INTRAMUSCULAR | Status: DC | PRN

## 2016-05-20 MED ORDER — HYDROMORPHONE HCL 1 MG/ML IJ SOLN
INTRAMUSCULAR | Status: AC
  Administered 2016-05-20: 0.25 mg via INTRAVENOUS
  Filled 2016-05-20: qty 0.5

## 2016-05-20 MED ORDER — MIDAZOLAM HCL 2 MG/2ML IJ SOLN
INTRAMUSCULAR | Status: AC
  Filled 2016-05-20: qty 2

## 2016-05-20 MED ORDER — PROPOFOL 500 MG/50ML IV EMUL
INTRAVENOUS | Status: DC | PRN
  Administered 2016-05-20: 75 ug/kg/min via INTRAVENOUS

## 2016-05-20 MED ORDER — ONDANSETRON HCL 4 MG/2ML IJ SOLN: 4.0000 mg | Freq: Four times a day (QID) | INTRAMUSCULAR | Status: DC | PRN

## 2016-05-20 MED ORDER — METHOCARBAMOL 500 MG PO TABS
500.0000 mg | ORAL_TABLET | Freq: Four times a day (QID) | ORAL | Status: DC | PRN
  Administered 2016-05-21 – 2016-05-22 (×4): 500 mg via ORAL
  Filled 2016-05-20 (×5): qty 1

## 2016-05-20 MED ORDER — PROPOFOL 10 MG/ML IV BOLUS
INTRAVENOUS | Status: AC
  Filled 2016-05-20: qty 20

## 2016-05-20 MED ORDER — CHLORHEXIDINE GLUCONATE 4 % EX LIQD: 60.0000 mL | Freq: Once | CUTANEOUS | Status: DC

## 2016-05-20 MED ORDER — ACETAMINOPHEN 325 MG PO TABS
650.0000 mg | ORAL_TABLET | Freq: Four times a day (QID) | ORAL | Status: DC | PRN
Start: 2016-05-20 — End: 2016-05-22
  Administered 2016-05-21 (×2): 650 mg via ORAL
  Filled 2016-05-20 (×2): qty 2

## 2016-05-20 MED ORDER — CLINDAMYCIN PHOSPHATE 900 MG/50ML IV SOLN
900.0000 mg | INTRAVENOUS | Status: AC
  Administered 2016-05-20: 900 mg via INTRAVENOUS
  Filled 2016-05-20: qty 50

## 2016-05-20 MED ORDER — INSULIN ASPART 100 UNIT/ML ~~LOC~~ SOLN
0.0000 [IU] | Freq: Three times a day (TID) | SUBCUTANEOUS | Status: DC
  Administered 2016-05-21: 2 [IU] via SUBCUTANEOUS
  Administered 2016-05-21 – 2016-05-22 (×2): 3 [IU] via SUBCUTANEOUS

## 2016-05-20 MED ORDER — DULOXETINE HCL 30 MG PO CPEP
30.0000 mg | ORAL_CAPSULE | Freq: Every day | ORAL | Status: DC
  Administered 2016-05-21 – 2016-05-22 (×2): 30 mg via ORAL
  Filled 2016-05-20 (×2): qty 1

## 2016-05-20 MED ORDER — ROCURONIUM BROMIDE 10 MG/ML (PF) SYRINGE
PREFILLED_SYRINGE | INTRAVENOUS | Status: AC
  Filled 2016-05-20: qty 10

## 2016-05-20 MED ORDER — FENTANYL CITRATE (PF) 100 MCG/2ML IJ SOLN
50.0000 ug | Freq: Once | INTRAMUSCULAR | Status: AC
  Administered 2016-05-20: 50 ug via INTRAVENOUS

## 2016-05-20 MED ORDER — INSULIN ASPART 100 UNIT/ML ~~LOC~~ SOLN: 4.0000 [IU] | Freq: Three times a day (TID) | SUBCUTANEOUS | Status: DC

## 2016-05-20 MED ORDER — BUPIVACAINE HCL (PF) 0.5 % IJ SOLN
INTRAMUSCULAR | Status: DC | PRN
  Administered 2016-05-20: 15 mL

## 2016-05-20 MED ORDER — HYDROMORPHONE HCL 1 MG/ML IJ SOLN: 1.0000 mg | INTRAMUSCULAR | Status: DC | PRN

## 2016-05-20 MED ORDER — GABAPENTIN 300 MG PO CAPS
300.0000 mg | ORAL_CAPSULE | Freq: Three times a day (TID) | ORAL | Status: DC
  Administered 2016-05-20 – 2016-05-22 (×5): 300 mg via ORAL
  Filled 2016-05-20 (×5): qty 1

## 2016-05-20 MED ORDER — ACETAMINOPHEN 650 MG RE SUPP: 650.0000 mg | Freq: Four times a day (QID) | RECTAL | Status: DC | PRN

## 2016-05-20 MED ORDER — OXYCODONE HCL 5 MG PO TABS
5.0000 mg | ORAL_TABLET | ORAL | Status: DC | PRN
  Administered 2016-05-20 – 2016-05-22 (×7): 10 mg via ORAL
  Filled 2016-05-20 (×8): qty 2

## 2016-05-20 MED ORDER — HYDROMORPHONE HCL 2 MG/ML IJ SOLN
1.0000 mg | INTRAMUSCULAR | Status: DC | PRN
  Administered 2016-05-21 (×2): 1 mg via INTRAVENOUS
  Filled 2016-05-20 (×2): qty 1

## 2016-05-20 MED ORDER — LOTEPREDNOL ETABONATE 0.5 % OP SUSP
1.0000 [drp] | Freq: Two times a day (BID) | OPHTHALMIC | Status: DC
  Administered 2016-05-21 – 2016-05-22 (×2): 1 [drp] via OPHTHALMIC
  Filled 2016-05-20 (×2): qty 5

## 2016-05-20 MED ORDER — PROPRANOLOL HCL ER 60 MG PO CP24
60.0000 mg | ORAL_CAPSULE | Freq: Every day | ORAL | Status: DC
  Filled 2016-05-20 (×2): qty 1

## 2016-05-20 MED ORDER — ONDANSETRON HCL 4 MG/2ML IJ SOLN
INTRAMUSCULAR | Status: DC | PRN
  Administered 2016-05-20: 4 mg via INTRAVENOUS

## 2016-05-20 MED ORDER — ASPIRIN EC 81 MG PO TBEC
81.0000 mg | DELAYED_RELEASE_TABLET | Freq: Every day | ORAL | Status: DC
  Administered 2016-05-21 – 2016-05-22 (×2): 81 mg via ORAL
  Filled 2016-05-20 (×2): qty 1

## 2016-05-20 MED ORDER — 0.9 % SODIUM CHLORIDE (POUR BTL) OPTIME
TOPICAL | Status: DC | PRN
  Administered 2016-05-20: 1000 mL

## 2016-05-20 MED ORDER — CLINDAMYCIN PHOSPHATE 600 MG/50ML IV SOLN
600.0000 mg | Freq: Four times a day (QID) | INTRAVENOUS | Status: AC
  Administered 2016-05-20 – 2016-05-21 (×3): 600 mg via INTRAVENOUS
  Filled 2016-05-20 (×3): qty 50

## 2016-05-20 MED ORDER — ONDANSETRON HCL 4 MG/2ML IJ SOLN
INTRAMUSCULAR | Status: AC
  Filled 2016-05-20: qty 2

## 2016-05-20 MED ORDER — LINAGLIPTIN 5 MG PO TABS
5.0000 mg | ORAL_TABLET | Freq: Every day | ORAL | Status: DC
  Administered 2016-05-21 – 2016-05-22 (×2): 5 mg via ORAL
  Filled 2016-05-20 (×2): qty 1

## 2016-05-20 MED ORDER — PROMETHAZINE HCL 25 MG/ML IJ SOLN: 6.2500 mg | INTRAMUSCULAR | Status: DC | PRN

## 2016-05-20 MED ORDER — ONDANSETRON HCL 4 MG PO TABS: 4.0000 mg | ORAL_TABLET | Freq: Four times a day (QID) | ORAL | Status: DC | PRN

## 2016-05-20 MED ORDER — MIDAZOLAM HCL 2 MG/2ML IJ SOLN
1.0000 mg | Freq: Once | INTRAMUSCULAR | Status: AC
  Administered 2016-05-20: 1 mg via INTRAVENOUS

## 2016-05-20 SURGICAL SUPPLY — 33 items
BENZOIN TINCTURE PRP APPL 2/3 (GAUZE/BANDAGES/DRESSINGS) ×2 IMPLANT
BLADE SAW SGTL HD 18.5X60.5X1. (BLADE) ×2 IMPLANT
BLADE SURG 21 STRL SS (BLADE) ×2 IMPLANT
BNDG COHESIVE 4X5 TAN STRL (GAUZE/BANDAGES/DRESSINGS) IMPLANT
BNDG GAUZE ELAST 4 BULKY (GAUZE/BANDAGES/DRESSINGS) IMPLANT
COVER SURGICAL LIGHT HANDLE (MISCELLANEOUS) ×2 IMPLANT
DRAPE INCISE IOBAN 66X45 STRL (DRAPES) ×6 IMPLANT
DRAPE U-SHAPE 47X51 STRL (DRAPES) ×2 IMPLANT
DRSG ADAPTIC 3X8 NADH LF (GAUZE/BANDAGES/DRESSINGS) IMPLANT
DRSG PAD ABDOMINAL 8X10 ST (GAUZE/BANDAGES/DRESSINGS) IMPLANT
DURAPREP 26ML APPLICATOR (WOUND CARE) ×2 IMPLANT
ELECT REM PT RETURN 9FT ADLT (ELECTROSURGICAL) ×2
ELECTRODE REM PT RTRN 9FT ADLT (ELECTROSURGICAL) ×1 IMPLANT
GAUZE SPONGE 4X4 12PLY STRL (GAUZE/BANDAGES/DRESSINGS) IMPLANT
GLOVE BIOGEL PI IND STRL 9 (GLOVE) ×1 IMPLANT
GLOVE BIOGEL PI INDICATOR 9 (GLOVE) ×1
GLOVE SURG ORTHO 9.0 STRL STRW (GLOVE) ×2 IMPLANT
GOWN STRL REUS W/ TWL XL LVL3 (GOWN DISPOSABLE) ×3 IMPLANT
GOWN STRL REUS W/TWL XL LVL3 (GOWN DISPOSABLE) ×3
KIT BASIN OR (CUSTOM PROCEDURE TRAY) ×2 IMPLANT
KIT ROOM TURNOVER OR (KITS) ×2 IMPLANT
NS IRRIG 1000ML POUR BTL (IV SOLUTION) ×2 IMPLANT
PACK ORTHO EXTREMITY (CUSTOM PROCEDURE TRAY) ×2 IMPLANT
PAD ARMBOARD 7.5X6 YLW CONV (MISCELLANEOUS) ×4 IMPLANT
PREVENA INCISION MGT 90 150 (MISCELLANEOUS) ×2 IMPLANT
SPONGE LAP 18X18 X RAY DECT (DISPOSABLE) IMPLANT
SUT ETHILON 2 0 PSLX (SUTURE) ×4 IMPLANT
SUT VIC AB 2-0 CTB1 (SUTURE) IMPLANT
TOWEL OR 17X24 6PK STRL BLUE (TOWEL DISPOSABLE) ×2 IMPLANT
TOWEL OR 17X26 10 PK STRL BLUE (TOWEL DISPOSABLE) ×2 IMPLANT
TUBE CONNECTING 12X1/4 (SUCTIONS) ×2 IMPLANT
WATER STERILE IRR 1000ML POUR (IV SOLUTION) ×2 IMPLANT
YANKAUER SUCT BULB TIP NO VENT (SUCTIONS) ×2 IMPLANT

## 2016-05-20 NOTE — Progress Notes (Signed)
Pt's sister @bedside . Denies having transportation to get home. Dr. Sharol Given aware. Will admit for overnight obs

## 2016-05-20 NOTE — Anesthesia Preprocedure Evaluation (Addendum)
Anesthesia Evaluation  Patient identified by MRN, date of birth, ID band Patient awake    Reviewed: Allergy & Precautions, NPO status , Patient's Chart, lab work & pertinent test results  Airway Mallampati: II  TM Distance: >3 FB Neck ROM: Full    Dental  (+) Teeth Intact, Dental Advisory Given   Pulmonary neg pulmonary ROS,    Pulmonary exam normal breath sounds clear to auscultation       Cardiovascular Exercise Tolerance: Good hypertension, Pt. on medications + Past MI and + Peripheral Vascular Disease  Normal cardiovascular exam Rhythm:Regular Rate:Normal     Neuro/Psych PSYCHIATRIC DISORDERS Depression  Neuromuscular disease    GI/Hepatic negative GI ROS, Neg liver ROS,   Endo/Other  diabetes, Type 2, Insulin DependentHypothyroidism Obesity   Renal/GU Renal InsufficiencyRenal disease     Musculoskeletal negative musculoskeletal ROS (+)   Abdominal   Peds  Hematology  (+) Blood dyscrasia, anemia ,   Anesthesia Other Findings Day of surgery medications reviewed with the patient.  Reproductive/Obstetrics                             Lab Results  Component Value Date   WBC 14.3 (H) 04/22/2016   HGB 11.5 (L) 04/22/2016   HCT 36.0 04/22/2016   MCV 84.7 04/22/2016   PLT 296 04/22/2016   Lab Results  Component Value Date   CREATININE 0.96 04/22/2016   BUN 29 (H) 04/22/2016   NA 140 04/22/2016   K 5.1 04/22/2016   CL 107 04/22/2016   CO2 25 04/22/2016    Anesthesia Physical  Anesthesia Plan  ASA: III  Anesthesia Plan: Regional and MAC   Post-op Pain Management:  Regional for Post-op pain   Induction: Intravenous  Airway Management Planned: Nasal Cannula  Additional Equipment:   Intra-op Plan:   Post-operative Plan:   Informed Consent: I have reviewed the patients History and Physical, chart, labs and discussed the procedure including the risks, benefits and  alternatives for the proposed anesthesia with the patient or authorized representative who has indicated his/her understanding and acceptance.   Dental advisory given  Plan Discussed with:   Anesthesia Plan Comments: (Risks/benefits of regional block discussed with patient including risk of bleeding, infection, nerve damage, and possibility of failed block.  Also discussed backup plan of general anesthesia and associated risks.  Patient wishes to proceed.)        Anesthesia Quick Evaluation

## 2016-05-20 NOTE — Progress Notes (Signed)
New interpretor- #Zaina, pt. Will sign consent after seeing Dr. Sharol Given today, she is inquiring about how much of the site he is removing.  Call to Dr. Sharol Given , explained the same.  Dr. Deatra Canter infornmed as well.

## 2016-05-20 NOTE — Anesthesia Procedure Notes (Signed)
Anesthesia Regional Block:  Popliteal block  Pre-Anesthetic Checklist: ,, timeout performed, Correct Patient, Correct Site, Correct Laterality, Correct Procedure, Correct Position, site marked, Risks and benefits discussed,  Surgical consent,  Pre-op evaluation,  At surgeon's request and post-op pain management  Laterality: Left  Prep: chloraprep       Needles:  Injection technique: Single-shot  Needle Type: Echogenic Needle     Needle Length: 9cm 9 cm Needle Gauge: 21 and 21 G    Additional Needles:  Procedures: ultrasound guided (picture in chart) and nerve stimulator Popliteal block  Nerve Stimulator or Paresthesia:  Response: tibial, 0.5 mA,   Additional Responses:   Narrative:  Start time: 05/20/2016 12:28 PM End time: 05/20/2016 12:36 PM Injection made incrementally with aspirations every 5 mL.  Performed by: Personally  Anesthesiologist: Suzette Battiest

## 2016-05-20 NOTE — Progress Notes (Signed)
Interpreter on the [facility]- [id number], name "Has", arabic , very helpful. Attempted IV access 2 times, unsuccessful except to obtain blood.

## 2016-05-20 NOTE — Anesthesia Procedure Notes (Signed)
Procedure Name: MAC Date/Time: 05/20/2016 1:19 PM Performed by: Barrington Ellison Pre-anesthesia Checklist: Patient identified, Emergency Drugs available, Suction available, Patient being monitored and Timeout performed Patient Re-evaluated:Patient Re-evaluated prior to inductionOxygen Delivery Method: Simple face mask

## 2016-05-20 NOTE — H&P (Signed)
Debbie [name] is an 55 y.o. female.   Chief Complaint: Dehiscence left transmetatarsal amputation HPI: Patient is a 55 year old woman diabetic insensate neuropathy peripheral vascular disease who is status post transmetatarsal amputation. Patient has progressive dehiscence she has failed conservative wound care and presents at this time for revision amputation  Past Medical History:  Diagnosis Date  . Anemia   . Blind   . Cataract   . Chest pain    a. ? MI in 2010, [place]->medically managed  . Chronic kidney disease    acute renal disease - 02/2016- "from bleeding so much"  . Depression   . Diabetic foot ulcer ([employer])   . History of blood transfusion    02/2016 abnormal bleeding vagina  . Hypertension    x ~ 10 years  . Hypothyroidism    no medication  . Neuropathy ([place])   . Obesity   . Shortness of breath dyspnea    with movement  . Type II or unspecified type diabetes mellitus with unspecified complication, uncontrolled    x ~ 10 years    Past Surgical History:  Procedure Laterality Date  . AMPUTATION Left 04/22/2016   Procedure: Left Transmetatarsal Amputation;  Surgeon: Newt Minion, MD;  Location: [place];  Service: Orthopedics;  Laterality: Left;  . AMPUTATION TOE Left 03/15/2016   Procedure: AMPUTATION LEFT GREAT TOE AT METATARSOPHALANGEAL JOINT;  Surgeon: Newt Minion, MD;  Location: [place];  Service: Orthopedics;  Laterality: Left;  . IR GENERIC HISTORICAL  01/28/2016   IR RADIOLOGIST EVAL & MGMT 01/28/2016 Jacqulynn Cadet, MD [facility] INTERV RAD  . PERIPHERAL VASCULAR CATHETERIZATION N/A 02/01/2016   Procedure: Abdominal Aortogram w/Lower Extremity;  Surgeon: Conrad [place], MD;  Location: [place] [facility];  Service: Cardiovascular;  Laterality: N/A;  . TUBAL LIGATION      Family History  Problem Relation Age of Onset  . Diabetes Father     died in his 50's?/died in her 64's?  . Diabetes Sister     alive and well.   Social History:  reports that she has never smoked.  She has never used smokeless tobacco. She reports that she does not drink alcohol or use drugs.  Allergies:  Allergies  Allergen Reactions  . Penicillins Rash    Has patient had a PCN reaction causing immediate rash, facial/tongue/throat swelling, SOB or lightheadedness with hypotension: Yes Has patient had a PCN reaction causing severe rash involving mucus membranes or skin necrosis: No Has patient had a PCN reaction that required hospitalization No Has patient had a PCN reaction occurring within the last 10 years: No If all of the above answers are "NO", then may proceed with Cephalosporin use.   Marland Kitchen Hydrocodone Rash  . Hydrocortisone Rash    Redness  . Latex Itching and Other (See Comments)    Redness  . Tylenol With Codeine #3 [Acetaminophen-Codeine] Swelling and Rash    SWELLING REACTION UNSPECIFIED     No prescriptions prior to admission.    No results found for this or any previous visit (from the past 48 hour(s)). No results found.  Review of Systems  All other systems reviewed and are negative.   There were no vitals taken for this visit. Physical Exam  On examination patient is alert oriented no adenopathy well-dressed normal affect normal respiratory effort patient has dehiscence of her left transmetatarsal amputation the ulcer probes to bone there is no purulence no abscess no cellulitis she has good hair growth in her leg she  has a palpable pulse Doppler shows a strong biphasic dorsalis pedis pulse. Assessment/Plan Assessment: Diabetic insensate neuropathy with peripheral vascular disease with dehiscence transmetatarsal amputation.  Plan: We'll plan for revision transmetatarsal amputation. Recommended with the patient that her best option would be to proceed with a transtibial amputation. Discussed that the chance of healing revision of the transmetatarsal amputation is approximately 50%. Patient and her sister state they understand and will not consider a transtibial  amputation at this time.  Newt Minion, MD 05/20/2016, 6:37 AM

## 2016-05-20 NOTE — Transfer of Care (Signed)
Immediate Anesthesia Transfer of Care Note  Patient: Debbie [name]  Procedure(s) Performed: Procedure(s): Left Chopart Amputation (Left) APPLICATION OF WOUND VAC (Left)  Patient Location: PACU  Anesthesia Type:MAC  Level of Consciousness: awake  Airway & Oxygen Therapy: Patient Spontanous Breathing and Patient connected to nasal cannula oxygen  Post-op Assessment: Report given to RN  Post vital signs: Reviewed and stable  Last Vitals:  Vitals:   05/20/16 0921 05/20/16 1355  BP: (!) 129/52 124/60  Pulse: 91 79  Resp: 20 10  Temp: 36.6 C 36.4 C    Last Pain:  Vitals:   05/20/16 0921  TempSrc: Oral         Complications: No apparent anesthesia complications

## 2016-05-20 NOTE — Op Note (Signed)
05/20/2016  1:54 PM  PATIENT:  Debbie [name]    PRE-OPERATIVE DIAGNOSIS:  Dehiscence Left Transmetatarsal Amputation  POST-OPERATIVE DIAGNOSIS:  Same  PROCEDURE:  Left Chopart Amputation, APPLICATION OF WOUND VAC  SURGEON:  Newt Minion, MD  PHYSICIAN ASSISTANT:None ANESTHESIA:   General  PREOPERATIVE INDICATIONS:  Debbie [name] is a  55 y.o. female with a diagnosis of Dehiscence Left Transmetatarsal Amputation who failed conservative measures and elected for surgical management.    The risks benefits and alternatives were discussed with the patient preoperatively including but not limited to the risks of infection, bleeding, nerve injury, cardiopulmonary complications, the need for revision surgery, among others, and the patient was willing to proceed.  OPERATIVE IMPLANTS: Prevena wound VAC  OPERATIVE FINDINGS: Minimal petechial bleeding with no necrotic tissue  OPERATIVE PROCEDURE: Patient brought the operating room after undergoing a regional block. After adequate levels anesthesia obtained patient's left lower extremity was prepped using DuraPrep draped into a sterile field a timeout was called. A fishmouth incision was made approximately 2 cm proximal to the necrotic wound edges. This was carried sharply down to bone the bone was resected through this Chopart joint with an oscillating saw. Electrocautery was used for hemostasis. The wound was irrigated with normal saline. The incision was closed using 2-0 nylon without tension on the skin. A Prevena wound VAC was applied this had a good suction fit there was no drainage patient was taken the PACU in stable condition.

## 2016-05-20 NOTE — Anesthesia Postprocedure Evaluation (Signed)
Anesthesia Post Note  Patient: Debbie [name]  Procedure(s) Performed: Procedure(s) (LRB): Left Chopart Amputation (Left) APPLICATION OF WOUND VAC (Left)  Patient location during evaluation: PACU Anesthesia Type: MAC and Regional Level of consciousness: awake and alert Pain management: pain level controlled Vital Signs Assessment: post-procedure vital signs reviewed and stable Respiratory status: spontaneous breathing, nonlabored ventilation, respiratory function stable and patient connected to nasal cannula oxygen Cardiovascular status: stable and blood pressure returned to baseline Anesthetic complications: no    Last Vitals:  Vitals:   05/20/16 1400 05/20/16 1415  BP: 124/60   Pulse: 77 79  Resp: 10 14  Temp:      Last Pain:  Vitals:   05/20/16 0921  TempSrc: Oral                 Tiajuana Amass

## 2016-05-21 ENCOUNTER — Encounter (HOSPITAL_COMMUNITY): Payer: Self-pay | Admitting: Orthopedic Surgery

## 2016-05-21 DIAGNOSIS — T8781 Dehiscence of amputation stump: Secondary | ICD-10-CM | POA: Diagnosis not present

## 2016-05-21 LAB — HEMOGLOBIN A1C
Hgb A1c MFr Bld: 8 % — ABNORMAL HIGH (ref 4.8–5.6)
Mean Plasma Glucose: 183 mg/dL

## 2016-05-21 LAB — GLUCOSE, CAPILLARY
GLUCOSE-CAPILLARY: 123 mg/dL — AB (ref 65–99)
GLUCOSE-CAPILLARY: 145 mg/dL — AB (ref 65–99)
GLUCOSE-CAPILLARY: 192 mg/dL — AB (ref 65–99)

## 2016-05-21 MED ORDER — INFLUENZA VAC SPLIT QUAD 0.5 ML IM SUSY
0.5000 mL | PREFILLED_SYRINGE | INTRAMUSCULAR | Status: AC
  Administered 2016-05-22: 0.5 mL via INTRAMUSCULAR
  Filled 2016-05-21: qty 0.5

## 2016-05-21 NOTE — Care Management Note (Signed)
Case Management Note  Patient Details  Name: Debbie [name] MRN: [id number] Date of Birth: February 24, 1961  Subjective/Objective:  55 yo F Left Chopart Amputation, application of wound vac              Action/Plan: pt needs assistance with transportation   Expected Discharge Date:    05/22/16              Expected Discharge Plan:  [place]  In-House Referral:     Discharge planning Services  CM Consult  Post Acute Care Choice:    Choice offered to:     DME Arranged:    DME Agency:     HH Arranged:    HH Agency:     Status of Service:     If discussed at [employer], dates discussed:    Additional Comments: Met with pt and sister. Discharge plan is for pt to return home with the support of her sister. Her sister stated that pt is bedridden and she needs an ambulance to transport her home. Her sister reports that pt is active with [facility] nursing for wound care. She reports that pt has a hospital bed that doesn't work. She doesn't know the name of the company where they got the bed from. Contacted Jamaine at [facility]. He stated that they got the bed from them and he will report that the bed needs to be repaired and sister made aware.      Norina Buzzard, RN 05/21/2016, 12:35 PM

## 2016-05-21 NOTE — Progress Notes (Signed)
Subjective: 1 Day Post-Op Procedure(s) (LRB): Left Chopart Amputation (Left) APPLICATION OF WOUND VAC (Left) Patient reports pain as moderate.    Objective: Vital signs in last 24 hours: Temp:  [97.6 F (36.4 C)-100.6 F (38.1 C)] 100.6 F (38.1 C) (11/11 0857) Pulse Rate:  [75-91] 82 (11/11 0857) Resp:  [10-20] 15 (11/11 0857) BP: (119-157)/(44-87) 119/55 (11/11 0857) SpO2:  [95 %-100 %] 99 % (11/11 0857) Weight:  [230 lb (104.3 kg)] 230 lb (104.3 kg) (11/10 0921)  Intake/Output from previous day: 11/10 0701 - 11/11 0700 In: 700 [I.V.:700] Out: 10 [Blood:10] Intake/Output this shift: No intake/output data recorded.   Recent Labs  05/20/16 0951  HGB 10.9*    Recent Labs  05/20/16 0951  WBC 11.5*  RBC 4.07  HCT 33.4*  PLT 330    Recent Labs  05/20/16 0951  NA 141  K 5.2*  CL 113*  CO2 21*  BUN 24*  CREATININE 0.93  GLUCOSE 143*  CALCIUM 8.7*   No results for input(s): LABPT, INR in the last 72 hours.  PE:   VAC working. Still on IV ABX home Sunday.  Will need VAC changes.   Assessment/Plan: 1 Day Post-Op Procedure(s) (LRB): Left Chopart Amputation (Left) APPLICATION OF WOUND VAC (Left) Continue IV ABX due to infecftion.   Marybelle Killings 05/21/2016, 9:08 AM

## 2016-05-21 NOTE — Progress Notes (Signed)
Patient ID: Debbie [name], female   DOB: 02-22-1961, 55 y.o.   MRN: [id number] Patient's pain not well controlled today. Required IV pain medication. Plan for discharge to home tomorrow. Patient's sister is in agreement.  Change wound VAC canister prior to discharge.

## 2016-05-21 NOTE — Progress Notes (Signed)
Confirmed with Jamaine from [facility] that pt is active with them for [facility]

## 2016-05-22 DIAGNOSIS — T8781 Dehiscence of amputation stump: Secondary | ICD-10-CM | POA: Diagnosis not present

## 2016-05-22 LAB — BASIC METABOLIC PANEL
Anion gap: 8 (ref 5–15)
BUN: 20 mg/dL (ref 6–20)
CALCIUM: 8.7 mg/dL — AB (ref 8.9–10.3)
CO2: 24 mmol/L (ref 22–32)
CREATININE: 1.14 mg/dL — AB (ref 0.44–1.00)
Chloride: 108 mmol/L (ref 101–111)
GFR calc non Af Amer: 53 mL/min — ABNORMAL LOW (ref >60)
GLUCOSE: 168 mg/dL — AB (ref 65–99)
Potassium: 4.5 mmol/L (ref 3.5–5.1)
Sodium: 140 mmol/L (ref 135–145)

## 2016-05-22 LAB — GLUCOSE, CAPILLARY
GLUCOSE-CAPILLARY: 173 mg/dL — AB (ref 65–99)
Glucose-Capillary: 151 mg/dL — ABNORMAL HIGH (ref 65–99)

## 2016-05-22 NOTE — Progress Notes (Signed)
Patient ID: Debbie [name], female   DOB: February 27, 1961, 55 y.o.   MRN: [id number] Home today with VAC. Follow up with Dr. Sharol Given in a week

## 2016-05-22 NOTE — Progress Notes (Signed)
Discharge instruction provided to pt and her sister. Pt 's IV D/C without complication. This nurse changed a new canester of prevena vac per MD order. Pt discharge vis non emergency ambulance. All questions answered and pt is pleasant to discharge.

## 2016-05-22 NOTE — Care Management (Addendum)
CM spoke with patient and her sister at the bedside. Patient will require PTAR for transportation home today. PTAR notified of request for transport to patient's home today at approximately 2pm per patient's nurse. CM asked if the patient's sister can ride to the home with her. Instructed to have the patient's sister ask when PTAR arrives. Lyam Provencio Film/video editor BSN CCM   1045 - CM spoke with [place] at [facility]. Notified patient has a Prevena Wound Vac. [facility] will manage. Presenter, broadcasting BSN CCM

## 2016-05-26 ENCOUNTER — Telehealth: Payer: Self-pay | Admitting: Internal Medicine

## 2016-05-26 NOTE — Telephone Encounter (Signed)
Please advise 

## 2016-05-26 NOTE — Telephone Encounter (Signed)
[facility] calling stating pt is using more insulin than Rx is written for Pharmacy is requesting a new Rx be sent over stating the amount that the pt was instructed  Pt did not state how much insulin she is using but states PCP instructed her to do so Pt will be out of insulin by tonight

## 2016-05-27 ENCOUNTER — Telehealth (INDEPENDENT_AMBULATORY_CARE_PROVIDER_SITE_OTHER): Payer: Self-pay

## 2016-05-27 NOTE — Telephone Encounter (Signed)
[facility] called and states that the family is having a lot of difficulty. The pt is not doing well she is disoriented and after talking with the [facility] Donita they family would like a hospice referral. Someone just to go out with an interpreter and  Explain in greater detail what this means and what her options would be. The pt is s/p a transmet amputation and has been running lo grade fever. Last office visit it was discussed that additional revison or higher amputation may be needed.

## 2016-05-27 NOTE — Telephone Encounter (Signed)
[facility] CALL TO ASK IF DR.DUDA WILL BE THE ATTENDING Dr. AND DOES HE WANT STANDING ORDERS FOR THIS PATIENT.  Cb# [phone]

## 2016-05-27 NOTE — Telephone Encounter (Signed)
Ok per MD for someone to come out and talk to the pt about options if that is the pt's wish.

## 2016-05-30 ENCOUNTER — Ambulatory Visit (INDEPENDENT_AMBULATORY_CARE_PROVIDER_SITE_OTHER): Payer: Medicaid Other | Admitting: Orthopedic Surgery

## 2016-05-30 ENCOUNTER — Encounter (INDEPENDENT_AMBULATORY_CARE_PROVIDER_SITE_OTHER): Payer: Self-pay | Admitting: Orthopedic Surgery

## 2016-05-30 ENCOUNTER — Telehealth: Payer: Self-pay | Admitting: Internal Medicine

## 2016-05-30 VITALS — Ht 65.0 in | Wt 230.0 lb

## 2016-05-30 DIAGNOSIS — Z89432 Acquired absence of left foot: Secondary | ICD-10-CM

## 2016-05-30 MED ORDER — DOXYCYCLINE HYCLATE 100 MG PO TABS: 100.0000 mg | ORAL_TABLET | Freq: Two times a day (BID) | ORAL | 0 refills | Status: DC

## 2016-05-30 MED ORDER — NITROGLYCERIN 0.2 MG/HR TD PT24: 0.2000 mg | MEDICATED_PATCH | Freq: Every day | TRANSDERMAL | 12 refills | Status: DC

## 2016-05-30 NOTE — Telephone Encounter (Signed)
Katharine Look with empalative care has contacted patients medical doctor about this already.

## 2016-05-30 NOTE — Addendum Note (Signed)
Addended by: Meridee Score on: 05/30/2016 10:56 AM   Modules accepted: Orders

## 2016-05-30 NOTE — Telephone Encounter (Signed)
Please call, I will be the attending physician for the amputation but her primary medical doctor would need to be the primary care physician for her medical care.

## 2016-05-30 NOTE — Progress Notes (Signed)
Wound Care Note   Patient: Debbie [name]           Date of Birth: [birth year]           MRN: [id number]             PCP: Angelica Chessman, MD Visit Date: 05/30/2016   Assessment & Plan: Visit Diagnoses:  1. S/P chopart's amputation, left [facility])     Plan: Patient family request follow-up at the wound healing center of [facility]. We will make the referral. Patient will continue using nitroglycerin patches daily patient will continue with dialysis of cleansing and daily dressing changes. Continue with home health nursing for dressing changes reevaluate in 2 weeks for suture removal.   Follow-Up Instructions: Return in about 2 weeks (around 06/13/2016).  Orders:  No orders of the defined types were placed in this encounter.  Meds ordered this encounter  Medications  . doxycycline (VIBRA-TABS) 100 MG tablet    Sig: Take 1 tablet (100 mg total) by mouth 2 (two) times daily.    Dispense:  60 tablet    Refill:  0      Procedures: No notes on file   Clinical Data: No additional findings.   No images are attached to the encounter.   Subjective: Chief Complaint  Patient presents with  . Left Foot - Routine Post Op    Left Chopart Amputation [id number] with application of wound vac.    Patient is here 10 days post op 05/20/16 left chopart amputation with application of prevena wound vac. Home health nurse had to remove prevena wound vac on Saturday due to malfunction of vac. Dry dressing removed today. There is blood drainage without odor. There is redness, swelling, tightness at sutures. She is nonweightbearing with wheelchair.    Review of Systems  Miscellaneous:  -Home Health Care: [facility]  -Physical Therapy: No    Objective: Vital Signs: Ht 5\' 5"  (1.651 m)   Wt 230 lb (104.3 kg)   LMP  (LMP Unknown)   BMI 38.27 kg/m   Physical Exam: On examination patient's foot has no redness no cellulitis no odor no drainage. The wound edges are well  approximated. The wound VAC did stop working and this was removed. The wound is gaped open about 2 mm there is no dehiscence no necrosis no signs of infection. We will start her on some doxycycline family request referral to the wound [place] regional  Specialty Comments: No specialty comments available.   PMFS History: Patient Active Problem List   Diagnosis Date Noted  . Foot amputation status, left ([place]) 05/20/2016  . S/P transmetatarsal amputation of foot, left ([place]) 04/22/2016  . Postmenopausal bleeding 03/02/2016  . AKI (acute kidney injury) ([place]) 02/27/2016  . Acute blood loss anemia   . Cellulitis of left toe   . Language barrier affecting health care   . Vaginal bleeding 02/25/2016  . Pain and swelling of toe of left foot   . Cellulitis of toe of left foot   . Atherosclerotic peripheral vascular disease with rest pain ([place]) 01/22/2016  . PAD (peripheral artery disease) ([place]) 11/05/2015  . Hypoglycemia 04/13/2015  . Chronic pain syndrome 02/05/2015  . Fall at home 02/05/2015  . PTSD (post-traumatic stress disorder) 01/09/2014  . Fungal dermatitis 01/09/2014  . Essential hypertension 01/09/2014  . Generalized anxiety disorder 12/13/2012  . Polyneuropathy in diabetes(357.2) 12/13/2012  . Diabetic retinopathy ([place]) 12/12/2012  . Diabetic foot ulcers ([place]) 12/05/2012  . Diabetic  foot infection ([place]) 11/30/2012  . Unspecified hypothyroidism 11/30/2012  . Adjustment disorder with mixed anxiety and depressed mood 11/10/2012  . Diabetes mellitus type 2 with peripheral artery disease ([place]) 11/09/2012   Past Medical History:  Diagnosis Date  . Anemia   . Blind   . Cataract   . Chest pain    a. ? MI in 2010, [place]->medically managed  . Chronic kidney disease    acute renal disease - 02/2016- "from bleeding so much"  . Depression   . Diabetic foot ulcer ([place])   . History of blood transfusion    02/2016 abnormal bleeding vagina  . Hypertension    x ~ 10  years  . Hypothyroidism    no medication  . Neuropathy ([place])   . Obesity   . Shortness of breath dyspnea    with movement  . Type II or unspecified type diabetes mellitus with unspecified complication, uncontrolled    x ~ 10 years    Family History  Problem Relation Age of Onset  . Diabetes Father     died in his 50's?/died in her 3's?  . Diabetes Sister     alive and well.   Past Surgical History:  Procedure Laterality Date  . AMPUTATION Left 04/22/2016   Procedure: Left Transmetatarsal Amputation;  Surgeon: Newt Minion, MD;  Location: [place];  Service: Orthopedics;  Laterality: Left;  . AMPUTATION Left 05/20/2016   Procedure: Left Chopart Amputation;  Surgeon: Newt Minion, MD;  Location: [place];  Service: Orthopedics;  Laterality: Left;  . AMPUTATION TOE Left 03/15/2016   Procedure: AMPUTATION LEFT GREAT TOE AT METATARSOPHALANGEAL JOINT;  Surgeon: Newt Minion, MD;  Location: [place];  Service: Orthopedics;  Laterality: Left;  . APPLICATION OF WOUND VAC Left 05/20/2016   Procedure: APPLICATION OF WOUND VAC;  Surgeon: Newt Minion, MD;  Location: [place];  Service: Orthopedics;  Laterality: Left;  . IR GENERIC HISTORICAL  01/28/2016   IR RADIOLOGIST EVAL & MGMT 01/28/2016 Jacqulynn Cadet, MD [facility] INTERV RAD  . PERIPHERAL VASCULAR CATHETERIZATION N/A 02/01/2016   Procedure: Abdominal Aortogram w/Lower Extremity;  Surgeon: Conrad Slater, MD;  Location: [place] [facility];  Service: Cardiovascular;  Laterality: N/A;  . TUBAL LIGATION     Social History   Occupational History  . Not on file.   Social History Main Topics  . Smoking status: Never Smoker  . Smokeless tobacco: Never Used  . Alcohol use No  . Drug use: No  . Sexual activity: Not on file

## 2016-05-30 NOTE — Telephone Encounter (Signed)
MA provided Verbal Order for Dr. Doreene Burke to be the attending PCP for hospice per providers authorization. Katharine Look noted the Verbal Order and had no further questions at this time.

## 2016-05-30 NOTE — Telephone Encounter (Signed)
Yes

## 2016-05-30 NOTE — Telephone Encounter (Signed)
Debbie [name] from [facility] and [facility] called to speak with PCP. Debbie [name] (pt) has a hospice referral and she has requested for PCP to be the attending of record. Debbie [name] needs to know if he will agree and if he will need standing order? Please follow up.   Thank you

## 2016-05-30 NOTE — Telephone Encounter (Signed)
Do you agree to be the attending provider for H&P orders?

## 2016-05-30 NOTE — Addendum Note (Signed)
Addended by: Maxcine Ham on: 05/30/2016 10:54 AM   Modules accepted: Orders

## 2016-05-31 ENCOUNTER — Telehealth (INDEPENDENT_AMBULATORY_CARE_PROVIDER_SITE_OTHER): Payer: Self-pay | Admitting: Orthopedic Surgery

## 2016-06-01 NOTE — Telephone Encounter (Signed)
I called spoke with Donita from [facility] to advised that per MD, patient requested referral to Wound care clinic in [place] while she is staying with family there. I advised her I was unaware of this time frame. But records have been sent to their office to see if they would accept her as a patient. Patient has appt with [place] on 06/13/16 for suture removal. And advised that dry dressing changes being applied daily with dial soap cleansing. She is going out to see the patient tomorrow.

## 2016-06-06 ENCOUNTER — Inpatient Hospital Stay (INDEPENDENT_AMBULATORY_CARE_PROVIDER_SITE_OTHER): Payer: Medicaid Other | Admitting: Orthopedic Surgery

## 2016-06-06 ENCOUNTER — Telehealth: Payer: Self-pay | Admitting: Internal Medicine

## 2016-06-06 DIAGNOSIS — IMO0002 Reserved for concepts with insufficient information to code with codable children: Secondary | ICD-10-CM

## 2016-06-06 NOTE — Telephone Encounter (Signed)
Patient's sister called and stated referral and authorization from PCP needs to be sent to Dr. Regenia Skeeter at [facility].  Please fax authorization to [phone]  Contact number for facility is [phone]

## 2016-06-07 NOTE — Telephone Encounter (Signed)
Referral has been placed and details are in the text.

## 2016-06-07 NOTE — Telephone Encounter (Signed)
Patient's sister called again and stated referral and authorization from PCP needs to be sent to Dr. Regenia Skeeter at [facility].  Please fax authorization to [phone]  Contact number for facility is [phone]

## 2016-06-08 NOTE — Telephone Encounter (Signed)
Thank you Nora

## 2016-06-08 NOTE — Telephone Encounter (Signed)
Noted   Referral was sent to Dr Ria Comment

## 2016-06-13 ENCOUNTER — Ambulatory Visit (INDEPENDENT_AMBULATORY_CARE_PROVIDER_SITE_OTHER): Payer: Medicaid Other | Admitting: Orthopedic Surgery

## 2016-06-17 ENCOUNTER — Telehealth (INDEPENDENT_AMBULATORY_CARE_PROVIDER_SITE_OTHER): Payer: Self-pay | Admitting: Orthopedic Surgery

## 2016-06-17 NOTE — Telephone Encounter (Signed)
Donita (nurse) with [facility] called advised patient is out of town with relatives and patient have missed two visits. The number to contact Donita is [phone]

## 2016-06-28 ENCOUNTER — Telehealth: Payer: Self-pay | Admitting: Internal Medicine

## 2016-06-28 ENCOUNTER — Telehealth (INDEPENDENT_AMBULATORY_CARE_PROVIDER_SITE_OTHER): Payer: Self-pay | Admitting: Orthopedic Surgery

## 2016-06-28 NOTE — Telephone Encounter (Signed)
Patient declined Palliative Services

## 2016-06-28 NOTE — Telephone Encounter (Signed)
Inez [facility]ina called and stated that patient's family declined pallative services.

## 2016-07-01 NOTE — Telephone Encounter (Signed)
Patient is in [place] with relatives and was referred to wound center in that area.

## 2016-07-05 ENCOUNTER — Other Ambulatory Visit: Payer: Self-pay | Admitting: Internal Medicine

## 2016-07-05 DIAGNOSIS — E1165 Type 2 diabetes mellitus with hyperglycemia: Secondary | ICD-10-CM

## 2016-07-08 ENCOUNTER — Telehealth (INDEPENDENT_AMBULATORY_CARE_PROVIDER_SITE_OTHER): Payer: Self-pay | Admitting: Orthopedic Surgery

## 2016-07-08 NOTE — Telephone Encounter (Signed)
[facility] asked if we have the op note from 2 months ago for pt, she stated that the coders were missing something on there. Her name is Freda Munro, her number is [phone]

## 2016-07-12 NOTE — Telephone Encounter (Signed)
I called [facility] and there was no answer, tried calling main number and there was no answer. Asked to please call back and give [place] the fax number she would like op note sent to.

## 2016-07-12 NOTE — Telephone Encounter (Signed)
Freda Munro called back to leave Fax number: [phone]

## 2016-07-12 NOTE — Telephone Encounter (Signed)
Faxed back to [phone]

## 2016-07-13 NOTE — Telephone Encounter (Signed)
Called back to saw they never received fax and to try new fax [phone]

## 2016-07-14 ENCOUNTER — Telehealth: Payer: Self-pay | Admitting: Internal Medicine

## 2016-07-14 NOTE — Telephone Encounter (Signed)
Debbie [name] from [facility] calling stating they faxed a request for incontinence supplies in November and received a fax back on 06/21/16. However, Debbie [name] states something messed up with the fax machine and they are requesting the form be re-faxed to [phone]

## 2016-07-14 NOTE — Telephone Encounter (Signed)
Thank you. I will re-fax

## 2016-07-15 NOTE — Telephone Encounter (Signed)
Faxed back to new number

## 2016-08-01 ENCOUNTER — Other Ambulatory Visit: Payer: Self-pay | Admitting: Internal Medicine

## 2016-08-01 DIAGNOSIS — E1151 Type 2 diabetes mellitus with diabetic peripheral angiopathy without gangrene: Secondary | ICD-10-CM

## 2016-08-01 DIAGNOSIS — E1165 Type 2 diabetes mellitus with hyperglycemia: Secondary | ICD-10-CM

## 2016-08-09 ENCOUNTER — Encounter (HOSPITAL_COMMUNITY): Payer: Self-pay | Admitting: Emergency Medicine

## 2016-08-09 ENCOUNTER — Emergency Department (HOSPITAL_COMMUNITY)
Admission: EM | Admit: 2016-08-09 | Discharge: 2016-08-09 | Disposition: A | Discharge status: Home / Self Care | Payer: Medicaid Other | Attending: Emergency Medicine | Admitting: Emergency Medicine

## 2016-08-09 ENCOUNTER — Emergency Department (HOSPITAL_COMMUNITY): Payer: Medicaid Other

## 2016-08-09 DIAGNOSIS — E11621 Type 2 diabetes mellitus with foot ulcer: Secondary | ICD-10-CM | POA: Diagnosis not present

## 2016-08-09 DIAGNOSIS — I129 Hypertensive chronic kidney disease with stage 1 through stage 4 chronic kidney disease, or unspecified chronic kidney disease: Secondary | ICD-10-CM | POA: Diagnosis not present

## 2016-08-09 DIAGNOSIS — Y999 Unspecified external cause status: Secondary | ICD-10-CM | POA: Insufficient documentation

## 2016-08-09 DIAGNOSIS — Z91040 Latex allergy status: Secondary | ICD-10-CM | POA: Diagnosis not present

## 2016-08-09 DIAGNOSIS — Z794 Long term (current) use of insulin: Secondary | ICD-10-CM | POA: Insufficient documentation

## 2016-08-09 DIAGNOSIS — Y929 Unspecified place or not applicable: Secondary | ICD-10-CM | POA: Insufficient documentation

## 2016-08-09 DIAGNOSIS — M79672 Pain in left foot: Secondary | ICD-10-CM | POA: Diagnosis present

## 2016-08-09 DIAGNOSIS — L97529 Non-pressure chronic ulcer of other part of left foot with unspecified severity: Secondary | ICD-10-CM | POA: Insufficient documentation

## 2016-08-09 DIAGNOSIS — E118 Type 2 diabetes mellitus with unspecified complications: Secondary | ICD-10-CM

## 2016-08-09 DIAGNOSIS — W06XXXA Fall from bed, initial encounter: Secondary | ICD-10-CM | POA: Diagnosis not present

## 2016-08-09 DIAGNOSIS — N189 Chronic kidney disease, unspecified: Secondary | ICD-10-CM | POA: Diagnosis not present

## 2016-08-09 DIAGNOSIS — Y939 Activity, unspecified: Secondary | ICD-10-CM | POA: Diagnosis not present

## 2016-08-09 DIAGNOSIS — E1140 Type 2 diabetes mellitus with diabetic neuropathy, unspecified: Secondary | ICD-10-CM | POA: Insufficient documentation

## 2016-08-09 DIAGNOSIS — E1122 Type 2 diabetes mellitus with diabetic chronic kidney disease: Secondary | ICD-10-CM | POA: Diagnosis not present

## 2016-08-09 DIAGNOSIS — Z7982 Long term (current) use of aspirin: Secondary | ICD-10-CM | POA: Diagnosis not present

## 2016-08-09 DIAGNOSIS — E039 Hypothyroidism, unspecified: Secondary | ICD-10-CM | POA: Diagnosis not present

## 2016-08-09 DIAGNOSIS — Z79899 Other long term (current) drug therapy: Secondary | ICD-10-CM | POA: Insufficient documentation

## 2016-08-09 LAB — CBC WITH DIFFERENTIAL/PLATELET
BASOS ABS: 0 10*3/uL (ref 0.0–0.1)
Basophils Relative: 0 %
EOS PCT: 4 %
Eosinophils Absolute: 0.4 10*3/uL (ref 0.0–0.7)
HEMATOCRIT: 33.4 % — AB (ref 36.0–46.0)
Hemoglobin: 10.1 g/dL — ABNORMAL LOW (ref 12.0–15.0)
LYMPHS ABS: 4 10*3/uL (ref 0.7–4.0)
LYMPHS PCT: 42 %
MCH: 25.5 pg — AB (ref 26.0–34.0)
MCHC: 30.2 g/dL (ref 30.0–36.0)
MCV: 84.3 fL (ref 78.0–100.0)
Monocytes Absolute: 0.3 10*3/uL (ref 0.1–1.0)
Monocytes Relative: 3 %
NEUTROS ABS: 4.9 10*3/uL (ref 1.7–7.7)
Neutrophils Relative %: 51 %
Platelets: 269 10*3/uL (ref 150–400)
RBC: 3.96 MIL/uL (ref 3.87–5.11)
RDW: 14.9 % (ref 11.5–15.5)
WBC: 9.6 10*3/uL (ref 4.0–10.5)

## 2016-08-09 LAB — BASIC METABOLIC PANEL
ANION GAP: 8 (ref 5–15)
BUN: 15 mg/dL (ref 6–20)
CO2: 27 mmol/L (ref 22–32)
Calcium: 9 mg/dL (ref 8.9–10.3)
Chloride: 107 mmol/L (ref 101–111)
Creatinine, Ser: 0.84 mg/dL (ref 0.44–1.00)
GFR calc Af Amer: >60 mL/min (ref >60)
GFR calc non Af Amer: >60 mL/min (ref >60)
GLUCOSE: 209 mg/dL — AB (ref 65–99)
POTASSIUM: 4.4 mmol/L (ref 3.5–5.1)
Sodium: 142 mmol/L (ref 135–145)

## 2016-08-09 LAB — CBG MONITORING, ED: Glucose-Capillary: 206 mg/dL — ABNORMAL HIGH (ref 65–99)

## 2016-08-09 MED ORDER — CLINDAMYCIN HCL 150 MG PO CAPS: 150.0000 mg | ORAL_CAPSULE | Freq: Four times a day (QID) | ORAL | 0 refills | Status: DC

## 2016-08-09 NOTE — ED Provider Notes (Signed)
[facility] DEPT Provider Note   CSN: [id number] Arrival date & time: 08/09/16  1033  By signing my name below, I, Evelene Croon, attest that this documentation has been prepared under the direction and in the presence of Pattricia Boss, MD . Electronically Signed: Evelene Croon, Scribe. 08/09/2016. 12:44 PM.  History   Chief Complaint Chief Complaint  Patient presents with  . Foot Pain  . Fall   LEVEL 5 CAVEAT DUE TO LANGUAGE BARRIER   The history is provided by the patient and a relative. The history is limited by a language barrier. No language interpreter was used (unable to reach interpreter).    HPI Comments:  Debbie [name] is a blind 56 y.o. female with a history of HTN and DM, who presents to the Emergency Department s/p fall this AM. Per family, pt fell today from her bed to the floor and landed on the left shoulder. Family states she did not witness the fall.  Family reports bleeding from site of left foot amputation. Bleeding was controlled with pressure.  Pt had the foot amputated in November 2017. Pt is not a native [place] speaker; history translated/provided by family member. Pt is not a smoker.  No fever.   Surgeon-Duda; [facility] Renita Papa)   Past Medical History:  Diagnosis Date  . Anemia   . Blind   . Cataract   . Chest pain    a. ? MI in 2010, [place]->medically managed  . Chronic kidney disease    acute renal disease - 02/2016- "from bleeding so much"  . Depression   . Diabetic foot ulcer ([place])   . History of blood transfusion    02/2016 abnormal bleeding vagina  . Hypertension    x ~ 10 years  . Hypothyroidism    no medication  . Neuropathy ([place])   . Obesity   . Shortness of breath dyspnea    with movement  . Type II or unspecified type diabetes mellitus with unspecified complication, uncontrolled    x ~ 10 years    Patient Active Problem List   Diagnosis Date Noted  . Foot amputation status, left ([place]) 05/20/2016  . S/P transmetatarsal  amputation of foot, left ([place]) 04/22/2016  . Postmenopausal bleeding 03/02/2016  . AKI (acute kidney injury) ([place]) 02/27/2016  . Acute blood loss anemia   . Cellulitis of left toe   . Language barrier affecting health care   . Vaginal bleeding 02/25/2016  . Pain and swelling of toe of left foot   . Cellulitis of toe of left foot   . Atherosclerotic peripheral vascular disease with rest pain ([place]) 01/22/2016  . PAD (peripheral artery disease) ([place]) 11/05/2015  . Hypoglycemia 04/13/2015  . Chronic pain syndrome 02/05/2015  . Fall at home 02/05/2015  . PTSD (post-traumatic stress disorder) 01/09/2014  . Fungal dermatitis 01/09/2014  . Essential hypertension 01/09/2014  . Generalized anxiety disorder 12/13/2012  . Polyneuropathy in diabetes(357.2) 12/13/2012  . Diabetic retinopathy ([place]) 12/12/2012  . Diabetic foot ulcers ([place]) 12/05/2012  . Diabetic foot infection ([place]) 11/30/2012  . Unspecified hypothyroidism 11/30/2012  . Adjustment disorder with mixed anxiety and depressed mood 11/10/2012  . Diabetes mellitus type 2 with peripheral artery disease ([place]) 11/09/2012    Past Surgical History:  Procedure Laterality Date  . AMPUTATION Left 04/22/2016   Procedure: Left Transmetatarsal Amputation;  Surgeon: Newt Minion, MD;  Location: [facility];  Service: Orthopedics;  Laterality: Left;  . AMPUTATION Left 05/20/2016   Procedure: Left [place] Amputation;  Surgeon: Beverely Low  Fernanda Drum, MD;  Location: [facility];  Service: Orthopedics;  Laterality: Left;  . AMPUTATION TOE Left 03/15/2016   Procedure: AMPUTATION LEFT GREAT TOE AT METATARSOPHALANGEAL JOINT;  Surgeon: Newt Minion, MD;  Location: [facility];  Service: Orthopedics;  Laterality: Left;  . APPLICATION OF WOUND VAC Left 05/20/2016   Procedure: APPLICATION OF WOUND VAC;  Surgeon: Newt Minion, MD;  Location: [facility];  Service: Orthopedics;  Laterality: Left;  . IR GENERIC HISTORICAL  01/28/2016   IR RADIOLOGIST EVAL & MGMT 01/28/2016 Jacqulynn Cadet,  MD [facility] INTERV RAD  . PERIPHERAL VASCULAR CATHETERIZATION N/A 02/01/2016   Procedure: Abdominal Aortogram w/Lower Extremity;  Surgeon: Conrad Kettering, MD;  Location: [facility];  Service: Cardiovascular;  Laterality: N/A;  . TUBAL LIGATION      OB History    No data available       Home Medications    Prior to Admission medications   Medication Sig Start Date End Date Taking? Authorizing Provider  aspirin EC 81 MG EC tablet Take 1 tablet (81 mg total) by mouth daily. 02/05/16   Elsia Nigel Sloop, DO  atorvastatin (LIPITOR) 40 MG tablet Take 1 tablet (40 mg total) by mouth daily at 8 pm. Patient taking differently: Take 40 mg by mouth every morning.  02/11/16   Tresa Garter, MD  clotrimazole (LOTRIMIN) 1 % cream APPLY TO AFFECTED AREAS TWICE DAILY Patient taking differently: Apply 1 application topically 2 (two) times daily.  11/10/15   Tresa Garter, MD  diphenhydrAMINE (BENADRYL) 25 MG tablet Take 1 tablet (25 mg total) by mouth every 6 (six) hours as needed. Patient taking differently: Take 25 mg by mouth every 6 (six) hours as needed (for allergies.).  02/11/16   Tresa Garter, MD  doxycycline (VIBRA-TABS) 100 MG tablet Take 1 tablet (100 mg total) by mouth 2 (two) times daily. 05/30/16   Newt Minion, MD  DULoxetine (CYMBALTA) 30 MG capsule Take 1 capsule (30 mg total) by mouth daily. 01/22/16   Conrad Buckland, MD  gabapentin (NEURONTIN) 300 MG capsule TAKE 1 CAPSULE BY MOUTH THREE TIMES A DAY 08/01/16   Tresa Garter, MD  glucose blood (ACCU-CHEK AVIVA PLUS) test strip Check blood sugar 3 times daily. Patient taking differently: 1 each by Other route See admin instructions. Check blood sugar once daily 01/28/16   Tresa Garter, MD  Lancets (ACCU-CHEK SOFT TOUCH) lancets Use as instructed Patient taking differently: 1 each by Other route See admin instructions. Check blood sugar once daily 12/31/15   Tresa Garter, MD  LANTUS 100 UNIT/ML injection INJECT  0.2 MILLILITERS (20 UNITS) INTO THE SKIN AT BEDTIME 08/01/16   Tresa Garter, MD  loteprednol (LOTEMAX) 0.5 % ophthalmic suspension Place 1 drop into both eyes 2 (two) times daily. 01/09/14   Tresa Garter, MD  megestrol (MEGACE) 40 MG tablet Take 1 tablet (40 mg total) by mouth 2 (two) times daily. Reduce to once daily one week after bleeding stops Patient taking differently: Take 40 mg by mouth daily.  03/02/16   Orpah Greek, MD  nitroGLYCERIN (NITRODUR - DOSED IN MG/24 HR) 0.2 mg/hr patch Place 1 patch (0.2 mg total) onto the skin daily. Apply patch to the top of the ankle. Change patch daily change location daily 05/20/16   Newt Minion, MD  nitroGLYCERIN (NITRODUR - DOSED IN MG/24 HR) 0.2 mg/hr patch Place 1 patch (0.2 mg total) onto the skin daily. 05/30/16  Newt Minion, MD  NOVOLOG 100 UNIT/ML injection INJECT 3 TIMES DAILY FOR BLOOD SUGAR: 70-120=0 UNITS, 121-150=2 UNITS, 151-200=3 UNITS, 201-250=5 UNITS, 251-300=8UNIT301-350=11 UNITS, 351- 05/03/16   Tresa Garter, MD  oxyCODONE-acetaminophen (ROXICET) 5-325 MG tablet Take 1 tablet by mouth every 4 (four) hours as needed for severe pain. 04/22/16   Newt Minion, MD  propranolol ER (INDERAL LA) 60 MG 24 hr capsule Take 1 capsule (60 mg total) by mouth daily. 07/23/15   Willeen Niece, MD  saxagliptin HCl (ONGLYZA) 2.5 MG TABS tablet Take 2.5 mg by mouth 2 (two) times daily.     Historical Provider, MD  senna (SENOKOT) 8.6 MG TABS tablet Take 2 tablets (17.2 mg total) by mouth at bedtime as needed (for constipation). 07/23/15   Willeen Niece, MD  triamcinolone cream (KENALOG) 0.1 % Apply 1 application topically 2 (two) times daily. Patient taking differently: Apply 1 application topically daily as needed (for itching).  02/11/16   Tresa Garter, MD    Family History Family History  Problem Relation Age of Onset  . Diabetes Father     died in his 50's?/died in her 70's?  . Diabetes Sister     alive and well.      Social History Social History  Substance Use Topics  . Smoking status: Never Smoker  . Smokeless tobacco: Never Used  . Alcohol use No     Allergies   Penicillins; Hydrocodone; Hydrocortisone; Latex; and Tylenol with codeine #3 [acetaminophen-codeine]   Review of Systems Review of Systems  Reason unable to perform ROS: Language barrier.  All other systems reviewed and are negative.    Physical Exam Updated Vital Signs BP 136/76   Pulse 84   Temp 98.2 F (36.8 C) (Oral)   Resp 18   LMP  (LMP Unknown)   SpO2 100%   Physical Exam  Constitutional: She is oriented to person, place, and time. She appears well-developed and well-nourished. No distress.  HENT:  Head: Normocephalic and atraumatic.  Right Ear: External ear normal.  Left Ear: External ear normal.  Nose: Nose normal.  Mouth/Throat: Oropharynx is clear and moist.  Eyes: Conjunctivae and EOM are normal. Pupils are equal, round, and reactive to light.  Neck: Normal range of motion. Neck supple.  Cardiovascular: Normal rate, regular rhythm, normal heart sounds and intact distal pulses.   Pulmonary/Chest: Effort normal.  Abdominal: Soft. Bowel sounds are normal. She exhibits no distension.  Musculoskeletal: Normal range of motion. She exhibits no edema or deformity.  Bilateral partial foot amputations. Right well healed well epithelialized stump Left with pink granulation tissue, small oozing of blood lateral aspect- it is somewhat erythematous at wound edges and mildly warm- proximal erythema  Neurological: She is alert and oriented to person, place, and time. She displays normal reflexes. No cranial nerve deficit. She exhibits normal muscle tone. Coordination normal.  Skin: Skin is warm and dry.  Psychiatric: She has a normal mood and affect.  Nursing note and vitals reviewed.    ED Treatments / Results  DIAGNOSTIC STUDIES:  Oxygen Saturation is 100% on RA, normal by my interpretation.    Labs (all  labs ordered are listed, but only abnormal results are displayed) Labs Reviewed  CBC WITH DIFFERENTIAL/PLATELET - Abnormal; Notable for the following:       Result Value   Hemoglobin 10.1 (*)    HCT 33.4 (*)    MCH 25.5 (*)    All other components within normal limits  BASIC METABOLIC PANEL - Abnormal; Notable for the following:    Glucose, Bld 209 (*)    All other components within normal limits  CBG MONITORING, ED - Abnormal; Notable for the following:    Glucose-Capillary 206 (*)    All other components within normal limits    EKG  EKG Interpretation  Date/Time:  Tuesday August 09 2016 12:50:35 EST Ventricular Rate:  83 PR Interval:    QRS Duration: 88 QT Interval:  426 QTC Calculation: 501 R Axis:   17 Text Interpretation:  Accelerated junctional rhythm Low voltage, precordial leads Borderline T abnormalities, anterior leads Borderline prolonged QT interval Baseline wander in lead(s) I III aVL V6 Confirmed by Aayla Marrocco MD, Benicia Bergevin [id number]) on 08/09/2016 1:49:20 PM       Radiology Dg Foot Complete Left  Result Date: 08/09/2016 CLINICAL DATA:  History of left foot transmetatarsal amputation for osteomyelitis 04/22/2016. Status post fall this morning with bleeding from the left foot. EXAM: LEFT FOOT - COMPLETE 3+ VIEW COMPARISON:  MRI left foot 02/03/2016. FINDINGS: Since the prior examination, the patient has undergone transmetatarsal amputation. No acute bony or joint abnormality is seen. Bones are osteopenic. Extensive atherosclerotic vascular disease is noted. No soft tissue gas is identified. IMPRESSION: Status post transmetatarsal amputation.  No acute bony abnormality. Osteopenia. Atherosclerosis. Electronically Signed   By: Inge Rise M.D.   On: 08/09/2016 13:43    Procedures Procedures (including critical care time)  Medications Ordered in ED Medications - No data to display   Initial Impression / Assessment and Plan / ED Course  I have reviewed the triage vital  signs and the nursing notes.  Pertinent labs & imaging results that were available during my care of the patient were reviewed by me and considered in my medical decision making (see chart for details).     Patient with reported mechanical fall oob with injury to left foot stump with some bleeding.  Exam of left foot reveals what appears to be chronic dehiscence with some mild injury, however redness and warmth may indicate some infection.  Discussed with Dr.Nitka on for Dr. Sharol Given (who performed surgery) and he will see in follow up in office tomorrow. Plan restart antibiotics.  Discussed with patient and sister - voice understnding of plan and return precautions.   2:05 PM Discussed case with Dr. Louanne Skye (orthopedic surgeon)   Final Clinical Impressions(s) / ED Diagnoses   Final diagnoses:  Diabetic foot [facility])    New Prescriptions Discharge Medication List as of 08/09/2016  3:14 PM    START taking these medications   Details  clindamycin (CLEOCIN) 150 MG capsule Take 1 capsule (150 mg total) by mouth every 6 (six) hours., Starting Tue 08/09/2016, Print       I personally performed the services described in this documentation, which was scribed in my presence. The recorded information has been reviewed and considered.    Pattricia Boss, MD 08/10/16 [phone]

## 2016-08-09 NOTE — ED Triage Notes (Signed)
Pt family member stated pt fell on the floor, pt had amputation on her L foot and c/o bleeding to stump. Family member applied bandage. No blood noted on outside of bandage, bleeding controlled. Pt c/o severe pain. Pt denies hitting head or LOC. Pt has no other complaints other than leg pain.

## 2016-08-09 NOTE — Discharge Instructions (Signed)
Please recheck with her doctor tomorrow.  Return if spreading redness.  Keep dressing place.

## 2016-08-10 ENCOUNTER — Encounter: Payer: Self-pay | Admitting: Internal Medicine

## 2016-08-10 ENCOUNTER — Ambulatory Visit: Payer: Medicaid Other | Attending: Internal Medicine | Admitting: Internal Medicine

## 2016-08-10 VITALS — BP 135/78 | HR 82 | Temp 97.8°F

## 2016-08-10 DIAGNOSIS — I10 Essential (primary) hypertension: Secondary | ICD-10-CM

## 2016-08-10 DIAGNOSIS — I252 Old myocardial infarction: Secondary | ICD-10-CM | POA: Diagnosis not present

## 2016-08-10 DIAGNOSIS — Z794 Long term (current) use of insulin: Secondary | ICD-10-CM | POA: Insufficient documentation

## 2016-08-10 DIAGNOSIS — E1142 Type 2 diabetes mellitus with diabetic polyneuropathy: Secondary | ICD-10-CM | POA: Insufficient documentation

## 2016-08-10 DIAGNOSIS — E1165 Type 2 diabetes mellitus with hyperglycemia: Secondary | ICD-10-CM | POA: Diagnosis not present

## 2016-08-10 DIAGNOSIS — Z7982 Long term (current) use of aspirin: Secondary | ICD-10-CM | POA: Insufficient documentation

## 2016-08-10 DIAGNOSIS — E785 Hyperlipidemia, unspecified: Secondary | ICD-10-CM | POA: Insufficient documentation

## 2016-08-10 DIAGNOSIS — E1151 Type 2 diabetes mellitus with diabetic peripheral angiopathy without gangrene: Secondary | ICD-10-CM | POA: Diagnosis not present

## 2016-08-10 DIAGNOSIS — G894 Chronic pain syndrome: Secondary | ICD-10-CM | POA: Insufficient documentation

## 2016-08-10 DIAGNOSIS — I129 Hypertensive chronic kidney disease with stage 1 through stage 4 chronic kidney disease, or unspecified chronic kidney disease: Secondary | ICD-10-CM | POA: Insufficient documentation

## 2016-08-10 DIAGNOSIS — N189 Chronic kidney disease, unspecified: Secondary | ICD-10-CM | POA: Diagnosis not present

## 2016-08-10 DIAGNOSIS — F419 Anxiety disorder, unspecified: Secondary | ICD-10-CM | POA: Diagnosis not present

## 2016-08-10 DIAGNOSIS — F4310 Post-traumatic stress disorder, unspecified: Secondary | ICD-10-CM | POA: Diagnosis not present

## 2016-08-10 DIAGNOSIS — E1122 Type 2 diabetes mellitus with diabetic chronic kidney disease: Secondary | ICD-10-CM | POA: Insufficient documentation

## 2016-08-10 DIAGNOSIS — E669 Obesity, unspecified: Secondary | ICD-10-CM | POA: Diagnosis not present

## 2016-08-10 DIAGNOSIS — E039 Hypothyroidism, unspecified: Secondary | ICD-10-CM | POA: Insufficient documentation

## 2016-08-10 DIAGNOSIS — L299 Pruritus, unspecified: Secondary | ICD-10-CM

## 2016-08-10 DIAGNOSIS — F329 Major depressive disorder, single episode, unspecified: Secondary | ICD-10-CM | POA: Insufficient documentation

## 2016-08-10 MED ORDER — CLOTRIMAZOLE 1 % EX CREA: 1.0000 "application " | TOPICAL_CREAM | Freq: Two times a day (BID) | CUTANEOUS | 3 refills | Status: DC

## 2016-08-10 MED ORDER — GABAPENTIN 300 MG PO CAPS: 300.0000 mg | ORAL_CAPSULE | Freq: Four times a day (QID) | ORAL | 3 refills | Status: DC

## 2016-08-10 MED ORDER — ATORVASTATIN CALCIUM 40 MG PO TABS: 40.0000 mg | ORAL_TABLET | ORAL | 3 refills | Status: DC

## 2016-08-10 MED ORDER — SAXAGLIPTIN HCL 2.5 MG PO TABS: 2.5000 mg | ORAL_TABLET | Freq: Two times a day (BID) | ORAL | 3 refills | Status: DC

## 2016-08-10 MED ORDER — INSULIN ASPART 100 UNIT/ML ~~LOC~~ SOLN: SUBCUTANEOUS | 3 refills | Status: DC

## 2016-08-10 MED ORDER — DULOXETINE HCL 30 MG PO CPEP: 30.0000 mg | ORAL_CAPSULE | Freq: Every day | ORAL | 3 refills | Status: DC

## 2016-08-10 MED ORDER — DIPHENHYDRAMINE HCL 25 MG PO TABS: 25.0000 mg | ORAL_TABLET | Freq: Four times a day (QID) | ORAL | 3 refills | Status: DC | PRN

## 2016-08-10 NOTE — Progress Notes (Signed)
Debbie [name], is a 56 y.o. female  [id number]  [id number]  DOB - 28-Mar-1961  Chief Complaint  Patient presents with  . Hospitalization Follow-up      Subjective:   Debbie [name] is a 56 y.o. 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 Patient presented to the ED yesterday for bleeding from site of recent left foot amputation, bleeding was controlled with pressure. Patient had the foot amputated in November 2017. Wound was dressed, bleeding stopped, patient was scheduled appointment to follow up with Dr. Sharol Given (Orthopedic Surgery). No new complaint today but patient's sister said they have employed a Chief Executive Officer to look into getting compensation from the [employer] knowing the association with amputation and [place]. Patient has no new complaint today. Patient has No headache, No chest pain, No abdominal pain - No Nausea, No new weakness tingling or numbness, No Cough - SOB. She needs refill of her medications. She also complained of some ongoing chronic generalized body itching but no rashes and no erythema. No change in cream or soap, no ingestion of allergens like medications of food.   Problem  Type 2 Diabetes Mellitus With Hyperglycemia (Hcc)   ALLERGIES: Allergies  Allergen Reactions  . Penicillins Rash    Has patient had a PCN reaction causing immediate rash, facial/tongue/throat swelling, SOB or lightheadedness with hypotension: Yes Has patient had a PCN reaction causing severe rash involving mucus membranes or skin necrosis: No Has patient had a PCN reaction that required hospitalization No Has patient had a PCN reaction occurring within the last 10 years: No If all of the above answers are "NO", then may proceed with Cephalosporin use.   Marland Kitchen Hydrocodone Rash  . Hydrocortisone Rash    Redness   . Latex Itching and Other (See Comments)    Redness  . Tylenol With Codeine #3 [Acetaminophen-Codeine] Swelling and Rash    SWELLING REACTION UNSPECIFIED    PAST MEDICAL HISTORY: Past Medical History:  Diagnosis Date  . Anemia   . Blind   . Cataract   . Chest pain    a. ? MI in 2010, [place]->medically managed  . Chronic kidney disease    acute renal disease - 02/2016- "from bleeding so much"  . Depression   . Diabetic foot ulcer ([place])   . History of blood transfusion    02/2016 abnormal bleeding vagina  . Hypertension    x ~ 10 years  . Hypothyroidism    no medication  . Neuropathy ([place])   . Obesity   . Shortness of breath dyspnea    with movement  . Type II or unspecified type diabetes mellitus with unspecified complication, uncontrolled    x ~ 10 years   MEDICATIONS AT HOME: Prior to Admission medications   Medication Sig Start Date End Date Taking? Authorizing Provider  aspirin EC 81 MG EC tablet Take 1 tablet (81 mg total) by mouth daily. 02/05/16  Yes Elsia Nigel Sloop, DO  atorvastatin (LIPITOR) 40 MG tablet Take 1 tablet (40 mg total) by mouth every morning. 08/10/16  Yes Tresa Garter, MD  clotrimazole (LOTRIMIN) 1 % cream Apply 1 application topically 2 (two) times daily. 08/10/16  Yes Tresa Garter, MD  diphenhydrAMINE (BENADRYL) 25 MG tablet Take 1 tablet (25 mg total) by mouth every 6 (six) hours as needed (for allergies.). 08/10/16  Yes Tresa Garter, MD  DULoxetine (CYMBALTA) 30 MG capsule Take 1 capsule (30 mg total) by mouth daily. 08/10/16  Yes Tresa Garter, MD  gabapentin (NEURONTIN) 300 MG capsule Take 1 capsule (300 mg total) by mouth 4 (four) times daily. 08/10/16  Yes Shariq Puig E Doreene Burke, MD  glucose blood (ACCU-CHEK AVIVA PLUS) test strip Check blood sugar 3 times daily. Patient taking differently: 1 each by Other route See admin instructions. Check blood sugar once daily 01/28/16  Yes Vipul Cafarelli E Mellony Danziger, MD  insulin aspart (NOVOLOG) 100  UNIT/ML injection INJECT 3 TIMES DAILY FOR BLOOD SUGAR: 70-120=0 UNITS, 121-150=2 UNITS, 151-200=3 UNITS, 201-250=5 UNITS, 251-300=8UNIT301-350=11 UNITS, 351- 08/10/16  Yes Netasha Wehrli Essie Christine, MD  Lancets (ACCU-CHEK SOFT TOUCH) lancets Use as instructed Patient taking differently: 1 each by Other route See admin instructions. Check blood sugar once daily 12/31/15  Yes Blayklee Mable E Doreene Burke, MD  LANTUS 100 UNIT/ML injection INJECT 0.2 MILLILITERS (20 UNITS) INTO THE SKIN AT BEDTIME 08/01/16  Yes Alora Gorey E Doreene Burke, MD  loteprednol (LOTEMAX) 0.5 % ophthalmic suspension Place 1 drop into both eyes 2 (two) times daily. 01/09/14  Yes Tresa Garter, MD  megestrol (MEGACE) 40 MG tablet Take 1 tablet (40 mg total) by mouth 2 (two) times daily. Reduce to once daily one week after bleeding stops Patient taking differently: Take 40 mg by mouth daily.  03/02/16  Yes Orpah Greek, MD  nitroGLYCERIN (NITRODUR - DOSED IN MG/24 HR) 0.2 mg/hr patch Place 1 patch (0.2 mg total) onto the skin daily. Apply patch to the top of the ankle. Change patch daily change location daily 05/20/16  Yes Newt Minion, MD  nitroGLYCERIN (NITRODUR - DOSED IN MG/24 HR) 0.2 mg/hr patch Place 1 patch (0.2 mg total) onto the skin daily. 05/30/16  Yes Newt Minion, MD  oxyCODONE-acetaminophen (ROXICET) 5-325 MG tablet Take 1 tablet by mouth every 4 (four) hours as needed for severe pain. 04/22/16  Yes Newt Minion, MD  propranolol ER (INDERAL LA) 60 MG 24 hr capsule Take 1 capsule (60 mg total) by mouth daily. 07/23/15  Yes Willeen Niece, MD  saxagliptin HCl (ONGLYZA) 2.5 MG TABS tablet Take 1 tablet (2.5 mg total) by mouth 2 (two) times daily. 08/10/16  Yes Tresa Garter, MD  senna (SENOKOT) 8.6 MG TABS tablet Take 2 tablets (17.2 mg total) by mouth at bedtime as needed (for constipation). 07/23/15  Yes Willeen Niece, MD  triamcinolone cream (KENALOG) 0.1 % Apply 1 application topically 2 (two) times daily. Patient taking  differently: Apply 1 application topically daily as needed (for itching).  02/11/16  Yes Tresa Garter, MD   Objective:   Vitals:   08/10/16 1442  BP: 135/78  Pulse: 82  Temp: 97.8 F (36.6 C)  TempSrc: Oral  SpO2: 99%   Exam General appearance : Awake, alert, not in any distress. Speech Clear. Not toxic looking, on wheel chair HEENT: Atraumatic and Normocephalic, pupils equally reactive to light and accomodation Neck: Supple, no JVD. No cervical lymphadenopathy.  Chest: Good air entry bilaterally, no added sounds  CVS: S1 S2 regular, no murmurs.  Abdomen: Bowel sounds present, Non tender and not distended with no gaurding, rigidity or rebound. Extremities: Bilateral partial foot amputations. Right well healed well epithelialized stump Left with pink granulation tissue, no active bleeding.  Neurology: Awake alert, and oriented X 3, CN II-XII intact, Non focal Skin: No Rash  Data Review Lab Results  Component Value Date   HGBA1C 8.0 (H) 05/20/2016  HGBA1C 6.4 03/11/2016   HGBA1C 6.4 02/11/2016    Assessment & Plan   1. Type 2 diabetes mellitus with hyperglycemia, unspecified long term insulin use status (HCC)  - saxagliptin HCl (ONGLYZA) 2.5 MG TABS tablet; Take 1 tablet (2.5 mg total) by mouth 2 (two) times daily.  Dispense: 60 tablet; Refill: 3 - insulin aspart (NOVOLOG) 100 UNIT/ML injection; INJECT 3 TIMES DAILY FOR BLOOD SUGAR: 70-120=0 UNITS, 121-150=2 UNITS, 151-200=3 UNITS, 201-250=5 UNITS, 251-300=8UNIT301-350=11 UNITS, 351-  Dispense: 20 mL; Refill: 3 - atorvastatin (LIPITOR) 40 MG tablet; Take 1 tablet (40 mg total) by mouth every morning.  Dispense: 90 tablet; Refill: 3  2. Essential hypertension  We have discussed target BP range and blood pressure goal. I have advised patient to check BP regularly and to call us back or report to clinic if the numbers are consistently higher than 140/90. We discussed the importance of compliance with medical therapy and  DASH diet recommended, consequences of uncontrolled hypertension discussed.  - continue current BP medications  3. Chronic pain syndrome  - gabapentin (NEURONTIN) 300 MG capsule; Take 1 capsule (300 mg total) by mouth 4 (four) times daily.  Dispense: 360 capsule; Refill: 3  4. PTSD (post-traumatic stress disorder)  - DULoxetine (CYMBALTA) 30 MG capsule; Take 1 capsule (30 mg total) by mouth daily.  Dispense: 30 capsule; Refill: 3  5. Diabetes mellitus type 2 with peripheral artery disease (HCC)  - gabapentin (NEURONTIN) 300 MG capsule; Take 1 capsule (300 mg total) by mouth 4 (four) times daily.  Dispense: 360 capsule; Refill: 3  6. Itching with irritation  - clotrimazole (LOTRIMIN) 1 % cream; Apply 1 application topically 2 (two) times daily.  Dispense: 60 g; Refill: 3 - diphenhydrAMINE (BENADRYL) 25 MG tablet; Take 1 tablet (25 mg total) by mouth every 6 (six) hours as needed (for allergies.).  Dispense: 60 tablet; Refill: 3  Patient have been counseled extensively about nutrition and exercise. Other issues discussed during this visit include: low cholesterol diet, weight control and daily exercise, foot care, annual eye examinations at Ophthalmology, importance of adherence with medications and regular follow-up. We also discussed long term complications of uncontrolled diabetes and hypertension.   Return in about 3 months (around 11/07/2016) for Hemoglobin A1C and Follow up, DM, Routine Follow Up, Follow up HTN.  The patient was given clear instructions to go to ER or return to medical center if symptoms don't improve, worsen or new problems develop. The patient verbalized understanding. The patient was told to call to get lab results if they haven't heard anything in the next week.   This note has been created with Surveyor, quantity. Any transcriptional errors are unintentional.    Angelica Chessman, MD, [place], [place], [place], [place] and [place], [place]   08/10/2016, 4:06 PM

## 2016-08-17 ENCOUNTER — Ambulatory Visit: Payer: Self-pay | Admitting: Internal Medicine

## 2016-09-14 ENCOUNTER — Telehealth: Payer: Self-pay | Admitting: Internal Medicine

## 2016-09-14 NOTE — Telephone Encounter (Signed)
Patient's sister is calling to follow up on paperwork given to PCP last week...  Please follow up

## 2016-09-15 NOTE — Telephone Encounter (Signed)
Patient is aware of paperwork needing to be completed to by herself. The paperwork presented were release forms for the patient to complete and return to her lawyer. The paperwork has no physician responsible tabs.  Paperwork is at the front desk for patient to pickup and complete. Medical Assistant left message on patient's home and cell voicemail. Voicemail states to give a call back to [place] with [facility] at [phone].

## 2016-09-26 ENCOUNTER — Other Ambulatory Visit: Payer: Self-pay | Admitting: Internal Medicine

## 2016-09-26 DIAGNOSIS — E1165 Type 2 diabetes mellitus with hyperglycemia: Secondary | ICD-10-CM

## 2016-10-31 ENCOUNTER — Other Ambulatory Visit: Payer: Self-pay | Admitting: Internal Medicine

## 2016-11-16 ENCOUNTER — Ambulatory Visit: Payer: Medicaid Other | Attending: Internal Medicine | Admitting: Internal Medicine

## 2016-11-16 VITALS — BP 145/80 | HR 82 | Temp 98.2°F | Resp 18

## 2016-11-16 DIAGNOSIS — F4310 Post-traumatic stress disorder, unspecified: Secondary | ICD-10-CM | POA: Diagnosis not present

## 2016-11-16 DIAGNOSIS — I129 Hypertensive chronic kidney disease with stage 1 through stage 4 chronic kidney disease, or unspecified chronic kidney disease: Secondary | ICD-10-CM | POA: Diagnosis not present

## 2016-11-16 DIAGNOSIS — E1151 Type 2 diabetes mellitus with diabetic peripheral angiopathy without gangrene: Secondary | ICD-10-CM | POA: Diagnosis not present

## 2016-11-16 DIAGNOSIS — I10 Essential (primary) hypertension: Secondary | ICD-10-CM | POA: Diagnosis not present

## 2016-11-16 DIAGNOSIS — Z79891 Long term (current) use of opiate analgesic: Secondary | ICD-10-CM | POA: Diagnosis not present

## 2016-11-16 DIAGNOSIS — Z760 Encounter for issue of repeat prescription: Secondary | ICD-10-CM | POA: Insufficient documentation

## 2016-11-16 DIAGNOSIS — Z89432 Acquired absence of left foot: Secondary | ICD-10-CM | POA: Diagnosis not present

## 2016-11-16 DIAGNOSIS — N39498 Other specified urinary incontinence: Secondary | ICD-10-CM | POA: Diagnosis not present

## 2016-11-16 DIAGNOSIS — Z794 Long term (current) use of insulin: Secondary | ICD-10-CM | POA: Diagnosis not present

## 2016-11-16 DIAGNOSIS — I739 Peripheral vascular disease, unspecified: Secondary | ICD-10-CM

## 2016-11-16 DIAGNOSIS — Z89439 Acquired absence of unspecified foot: Secondary | ICD-10-CM | POA: Diagnosis not present

## 2016-11-16 DIAGNOSIS — G894 Chronic pain syndrome: Secondary | ICD-10-CM | POA: Insufficient documentation

## 2016-11-16 DIAGNOSIS — Z79899 Other long term (current) drug therapy: Secondary | ICD-10-CM | POA: Diagnosis not present

## 2016-11-16 DIAGNOSIS — E1122 Type 2 diabetes mellitus with diabetic chronic kidney disease: Secondary | ICD-10-CM | POA: Insufficient documentation

## 2016-11-16 DIAGNOSIS — N189 Chronic kidney disease, unspecified: Secondary | ICD-10-CM | POA: Insufficient documentation

## 2016-11-16 DIAGNOSIS — R0602 Shortness of breath: Secondary | ICD-10-CM | POA: Diagnosis not present

## 2016-11-16 DIAGNOSIS — Z7982 Long term (current) use of aspirin: Secondary | ICD-10-CM | POA: Diagnosis not present

## 2016-11-16 DIAGNOSIS — R3981 Functional urinary incontinence: Secondary | ICD-10-CM

## 2016-11-16 LAB — POCT GLYCOSYLATED HEMOGLOBIN (HGB A1C): HEMOGLOBIN A1C: 9.5

## 2016-11-16 LAB — GLUCOSE, POCT (MANUAL RESULT ENTRY): POC GLUCOSE: 200 mg/dL — AB (ref 70–99)

## 2016-11-16 MED ORDER — ATORVASTATIN CALCIUM 40 MG PO TABS: 40.0000 mg | ORAL_TABLET | ORAL | 3 refills | Status: DC

## 2016-11-16 MED ORDER — OXYBUTYNIN CHLORIDE ER 15 MG PO TB24: 15.0000 mg | ORAL_TABLET | Freq: Every day | ORAL | 3 refills | Status: DC

## 2016-11-16 MED ORDER — GABAPENTIN 300 MG PO CAPS: 300.0000 mg | ORAL_CAPSULE | Freq: Four times a day (QID) | ORAL | 3 refills | Status: DC

## 2016-11-16 MED ORDER — ASPIRIN 81 MG PO TBEC: 81.0000 mg | DELAYED_RELEASE_TABLET | Freq: Every day | ORAL | 0 refills | Status: AC

## 2016-11-16 MED ORDER — DULOXETINE HCL 30 MG PO CPEP: 30.0000 mg | ORAL_CAPSULE | Freq: Every day | ORAL | 3 refills | Status: DC

## 2016-11-16 MED ORDER — SAXAGLIPTIN HCL 2.5 MG PO TABS: 2.5000 mg | ORAL_TABLET | Freq: Two times a day (BID) | ORAL | 3 refills | Status: DC

## 2016-11-16 MED ORDER — PROPRANOLOL HCL ER 60 MG PO CP24: 60.0000 mg | ORAL_CAPSULE | Freq: Every day | ORAL | 0 refills | Status: DC

## 2016-11-16 NOTE — Patient Instructions (Signed)
Diabetes and Foot Care Diabetes may cause you to have problems because of poor blood supply (circulation) to your feet and legs. This may cause the skin on your feet to become thinner, break easier, and heal more slowly. Your skin may become dry, and the skin may peel and crack. You may also have nerve damage in your legs and feet causing decreased feeling in them. You may not notice minor injuries to your feet that could lead to infections or more serious problems. Taking care of your feet is one of the most important things you can do for yourself. Follow these instructions at home:  Wear shoes at all times, even in the house. Do not go barefoot. Bare feet are easily injured.  Check your feet daily for blisters, cuts, and redness. If you cannot see the bottom of your feet, use a mirror or ask someone for help.  Wash your feet with warm water (do not use hot water) and mild soap. Then pat your feet and the areas between your toes until they are completely dry. Do not soak your feet as this can dry your skin.  Apply a moisturizing lotion or petroleum jelly (that does not contain alcohol and is unscented) to the skin on your feet and to dry, brittle toenails. Do not apply lotion between your toes.  Trim your toenails straight across. Do not dig under them or around the cuticle. File the edges of your nails with an emery board or nail file.  Do not cut corns or calluses or try to remove them with medicine.  Wear clean socks or stockings every day. Make sure they are not too tight. Do not wear knee-high stockings since they may decrease blood flow to your legs.  Wear shoes that fit properly and have enough cushioning. To break in new shoes, wear them for just a few hours a day. This prevents you from injuring your feet. Always look in your shoes before you put them on to be sure there are no objects inside.  Do not cross your legs. This may decrease the blood flow to your feet.  If you find a  minor scrape, cut, or break in the skin on your feet, keep it and the skin around it clean and dry. These areas may be cleansed with mild soap and water. Do not cleanse the area with peroxide, alcohol, or iodine.  When you remove an adhesive bandage, be sure not to damage the skin around it.  If you have a wound, look at it several times a day to make sure it is healing.  Do not use heating pads or hot water bottles. They may burn your skin. If you have lost feeling in your feet or legs, you may not know it is happening until it is too late.  Make sure your health care provider performs a complete foot exam at least annually or more often if you have foot problems. Report any cuts, sores, or bruises to your health care provider immediately. Contact a health care provider if:  You have an injury that is not healing.  You have cuts or breaks in the skin.  You have an ingrown nail.  You notice redness on your legs or feet.  You feel burning or tingling in your legs or feet.  You have pain or cramps in your legs and feet.  Your legs or feet are numb.  Your feet always feel cold. Get help right away if:  There is increasing   redness, swelling, or pain in or around a wound.  There is a red line that goes up your leg.  Pus is coming from a wound.  You develop a fever or as directed by your health care provider.  You notice a bad smell coming from an ulcer or wound. This information is not intended to replace advice given to you by your health care provider. Make sure you discuss any questions you have with your health care provider. Document Released: 06/24/2000 Document Revised: 12/03/2015 Document Reviewed: 12/04/2012 Elsevier Interactive Patient Education  2017 [employer]. Blood Glucose Monitoring, Adult Monitoring your blood sugar (glucose) helps you manage your diabetes. It also helps you and your health care provider determine how well your diabetes management plan is  working. Blood glucose monitoring involves checking your blood glucose as often as directed, and keeping a record (log) of your results over time. Why should I monitor my blood glucose? Checking your blood glucose regularly can:  Help you understand how food, exercise, illnesses, and medicines affect your blood glucose.  Let you know what your blood glucose is at any time. You can quickly tell if you are having low blood glucose (hypoglycemia) or high blood glucose (hyperglycemia).  Help you and your health care provider adjust your medicines as needed. When should I check my blood glucose? Follow instructions from your health care provider about how often to check your blood glucose. This may depend on:  The type of diabetes you have.  How well-controlled your diabetes is.  Medicines you are taking. If you have type 1 diabetes:  Check your blood glucose at least 2 times a day.  Also check your blood glucose:  Before every insulin injection.  Before and after exercise.  Between meals.  2 hours after a meal.  Occasionally between 2:00 a.m. and 3:00 a.m., as directed.  Before potentially dangerous tasks, like driving or using heavy machinery.  At bedtime.  You may need to check your blood glucose more often, up to 6-10 times a day:  If you use an insulin pump.  If you need multiple daily injections (MDI).  If your diabetes is not well-controlled.  If you are ill.  If you have a history of severe hypoglycemia.  If you have a history of not knowing when your blood glucose is getting low (hypoglycemia unawareness). If you have type 2 diabetes:  If you take insulin or other diabetes medicines, check your blood glucose at least 2 times a day.  If you are on intensive insulin therapy, check your blood glucose at least 4 times a day. Occasionally, you may also need to check between 2:00 a.m. and 3:00 a.m., as directed.  Also check your blood glucose:  Before and after  exercise.  Before potentially dangerous tasks, like driving or using heavy machinery.  You may need to check your blood glucose more often if:  Your medicine is being adjusted.  Your diabetes is not well-controlled.  You are ill. What is a blood glucose log?  A blood glucose log is a record of your blood glucose readings. It helps you and your health care provider:  Look for patterns in your blood glucose over time.  Adjust your diabetes management plan as needed.  Every time you check your blood glucose, write down your result and notes about things that may be affecting your blood glucose, such as your diet and exercise for the day.  Most glucose meters store a record of glucose readings in   the meter. Some meters allow you to download your records to a computer. How do I check my blood glucose? Follow these steps to get accurate readings of your blood glucose: Supplies needed   Blood glucose meter.  Test strips for your meter. Each meter has its own strips. You must use the strips that come with your meter.  A needle to prick your finger (lancet). Do not use lancets more than once.  A device that holds the lancet (lancing device).  A journal or log book to write down your results. Procedure  Wash your hands with soap and water.  Prick the side of your finger (not the tip) with the lancet. Use a different finger each time.  Gently rub the finger until a small drop of blood appears.  Follow instructions that come with your meter for inserting the test strip, applying blood to the strip, and using your blood glucose meter.  Write down your result and any notes. Alternative testing sites  Some meters allow you to use areas of your body other than your finger (alternative sites) to test your blood.  If you think you may have hypoglycemia, or if you have hypoglycemia unawareness, do not use alternative sites. Use your finger instead.  Alternative sites may not be as  accurate as the fingers, because blood flow is slower in these areas. This means that the result you get may be delayed, and it may be different from the result that you would get from your finger.  The most common alternative sites are:  Forearm.  Thigh.  Palm of the hand. Additional tips  Always keep your supplies with you.  If you have questions or need help, all blood glucose meters have a 24-hour "hotline" number that you can call. You may also contact your health care provider.  After you use a few boxes of test strips, adjust (calibrate) your blood glucose meter by following instructions that came with your meter. This information is not intended to replace advice given to you by your health care provider. Make sure you discuss any questions you have with your health care provider. Document Released: 06/30/2003 Document Revised: 01/15/2016 Document Reviewed: 12/07/2015 Elsevier Interactive Patient Education  2017 Elsevier Inc.  

## 2016-11-16 NOTE — Progress Notes (Signed)
Debbie [name], is a 56 y.o. female  EKC:[id number]  [id number]:[id number]  DOB - [birth year]  Chief Complaint  Patient presents with  . Shortness of Breath       Subjective:   Debbie [name] is a 56 y.o. 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 Patient is present here with her sister who is her caregiver. She claims adherence with medications, she is still wheelchair bound, follows up with orthopedic surgeon and currently seeing wound specialists for wound care. She complaints of pain around the amputation site and also possible phantom pain. She has no fever, no discharge from wound, no redness, no swelling. She still has urinary incontinence. Patient has No headache, No chest pain, No abdominal pain - No Nausea. No problems updated.  ALLERGIES: Allergies  Allergen Reactions  . Penicillins Rash    Has patient had a PCN reaction causing immediate rash, facial/tongue/throat swelling, SOB or lightheadedness with hypotension: Yes Has patient had a PCN reaction causing severe rash involving mucus membranes or skin necrosis: No Has patient had a PCN reaction that required hospitalization No Has patient had a PCN reaction occurring within the last 10 years: No If all of the above answers are "NO", then may proceed with Cephalosporin use.   Marland Kitchen Hydrocodone Rash  . Hydrocortisone Rash    Redness  . Latex Itching and Other (See Comments)    Redness  . Tylenol With Codeine #3 [Acetaminophen-Codeine] Swelling and Rash    SWELLING REACTION UNSPECIFIED     PAST MEDICAL HISTORY: Past Medical History:  Diagnosis Date  . Anemia   . Blind   . Cataract   . Chest pain    a. ? MI in 2010, [place]->medically managed  . Chronic kidney disease    acute renal disease - 02/2016- "from bleeding so much"  . Depression   .  Diabetic foot ulcer ([place])   . History of blood transfusion    02/2016 abnormal bleeding vagina  . Hypertension    x ~ 10 years  . Hypothyroidism    no medication  . Neuropathy ([place])   . Obesity   . Shortness of breath dyspnea    with movement  . Type II or unspecified type diabetes mellitus with unspecified complication, uncontrolled    x ~ 10 years    MEDICATIONS AT HOME: Prior to Admission medications   Medication Sig Start Date End Date Taking? Authorizing Provider  aspirin 81 MG EC tablet Take 1 tablet (81 mg total) by mouth daily. 11/16/16   Tresa Garter, MD  atorvastatin (LIPITOR) 40 MG tablet Take 1 tablet (40 mg total) by mouth every morning. 11/16/16   Tresa Garter, MD  B-D INS SYR ULTRAFINE 1CC/31G 31G X 5/16" 1 ML MISC USE AS DIRECTED WITH LANTUS AND NOVOLOG INJECTIONS 10/31/16   Tresa Garter, MD  clotrimazole (LOTRIMIN) 1 % cream Apply 1 application topically 2 (two) times daily. 08/10/16   Tresa Garter, MD  diphenhydrAMINE (BENADRYL) 25 MG tablet Take 1 tablet (25 mg total) by mouth every 6 (six) hours as needed (for allergies.). 08/10/16   Tresa Garter, MD  DULoxetine (CYMBALTA) 30 MG capsule Take 1 capsule (30 mg total) by mouth daily. 11/16/16   Tresa Garter, MD  gabapentin (NEURONTIN) 300 MG capsule Take 1 capsule (300 mg total) by mouth 4 (four) times daily.  11/16/16   Tresa Garter, MD  glucose blood (ACCU-CHEK AVIVA PLUS) test strip Check blood sugar 3 times daily. Patient taking differently: 1 each by Other route See admin instructions. Check blood sugar once daily 01/28/16   Azari Janssens E, MD  insulin aspart (NOVOLOG) 100 UNIT/ML injection INJECT 3 TIMES DAILY FOR BLOOD SUGAR: 70-120=0 UNITS, 121-150=2 UNITS, 151-200=3 UNITS, 201-250=5 UNITS, 251-300=8UNIT301-350=11 UNITS, 351- 08/10/16   Trenton Passow E, MD  Lancets (ACCU-CHEK SOFT TOUCH) lancets Use as instructed Patient taking differently: 1 each by Other route  See admin instructions. Check blood sugar once daily 12/31/15   Angelica Chessman E, MD  LANTUS 100 UNIT/ML injection INJECT 0.2 MILLILITERS INTO THE SKIN AT BEDTIME 09/27/16   Rilei Kravitz E, MD  loteprednol (LOTEMAX) 0.5 % ophthalmic suspension Place 1 drop into both eyes 2 (two) times daily. 01/09/14   Tresa Garter, MD  megestrol (MEGACE) 40 MG tablet Take 1 tablet (40 mg total) by mouth 2 (two) times daily. Reduce to once daily one week after bleeding stops Patient taking differently: Take 40 mg by mouth daily.  03/02/16   Orpah Greek, MD  nitroGLYCERIN (NITRODUR - DOSED IN MG/24 HR) 0.2 mg/hr patch Place 1 patch (0.2 mg total) onto the skin daily. Apply patch to the top of the ankle. Change patch daily change location daily 05/20/16   Newt Minion, MD  nitroGLYCERIN (NITRODUR - DOSED IN MG/24 HR) 0.2 mg/hr patch Place 1 patch (0.2 mg total) onto the skin daily. 05/30/16   Newt Minion, MD  oxybutynin (DITROPAN XL) 15 MG 24 hr tablet Take 1 tablet (15 mg total) by mouth at bedtime. 11/16/16   Tresa Garter, MD  oxyCODONE-acetaminophen (ROXICET) 5-325 MG tablet Take 1 tablet by mouth every 4 (four) hours as needed for severe pain. 04/22/16   Newt Minion, MD  propranolol ER (INDERAL LA) 60 MG 24 hr capsule Take 1 capsule (60 mg total) by mouth daily. 11/16/16   Tresa Garter, MD  saxagliptin HCl (ONGLYZA) 2.5 MG TABS tablet Take 1 tablet (2.5 mg total) by mouth 2 (two) times daily. 11/16/16   Tresa Garter, MD  senna (SENOKOT) 8.6 MG TABS tablet Take 2 tablets (17.2 mg total) by mouth at bedtime as needed (for constipation). 07/23/15   Willeen Niece, MD  triamcinolone cream (KENALOG) 0.1 % Apply 1 application topically 2 (two) times daily. Patient taking differently: Apply 1 application topically daily as needed (for itching).  02/11/16   Tresa Garter, MD    Objective:   Vitals:   11/16/16 1501  BP: (!) 145/80  Pulse: 82  Resp: 18  Temp: 98.2  F (36.8 C)  TempSrc: Oral  SpO2: 96%   Exam General appearance : Awake, alert, not in any distress. Speech Clear. Not toxic looking HEENT: Atraumatic and Normocephalic, pupils equally reactive to light and accomodation Neck: Supple, no JVD. No cervical lymphadenopathy.  Chest: Good air entry bilaterally, no added sounds  CVS: S1 S2 regular, no murmurs.  Abdomen: Bowel sounds present, Non tender and not distended with no gaurding, rigidity or rebound. Extremities: B/L Lower Ext shows no edema, both legs are warm to touch. Bilateral partial foot amputations.  Neurology: Awake alert, and oriented X 3, CN II-XII intact, Non focal Skin: No Rash  Data Review Lab Results  Component Value Date   HGBA1C 8.0 (H) 05/20/2016   HGBA1C 6.4 03/11/2016   HGBA1C 6.4 02/11/2016    Assessment & Plan  1. Essential hypertension  - propranolol ER (INDERAL LA) 60 MG 24 hr capsule; Take 1 capsule (60 mg total) by mouth daily.  Dispense: 90 capsule; Refill: 0 - Urinalysis, Complete  2. Chronic pain syndrome  - Continue current pain medications  3. PAD (peripheral artery disease) (HCC)  - atorvastatin (LIPITOR) 40 MG tablet; Take 1 tablet (40 mg total) by mouth every morning.  Dispense: 90 tablet; Refill: 3 - aspirin 81 MG EC tablet; Take 1 tablet (81 mg total) by mouth daily.  Dispense: 30 tablet; Refill: 0  4. S/P transmetatarsal amputation of foot, left ([place])  - Follow up with wound care and orthopedic surgeons  5. Urinary incontinence due to immobility  - oxybutynin (DITROPAN XL) 15 MG 24 hr tablet; Take 1 tablet (15 mg total) by mouth at bedtime.  Dispense: 30 tablet; Refill: 3  6. Diabetes mellitus type 2 with peripheral artery disease (HCC)  - saxagliptin HCl (ONGLYZA) 2.5 MG TABS tablet; Take 1 tablet (2.5 mg total) by mouth 2 (two) times daily.  Dispense: 60 tablet; Refill: 3 - gabapentin (NEURONTIN) 300 MG capsule; Take 1 capsule (300 mg total) by mouth 4 (four) times daily.   Dispense: 360 capsule; Refill: 3  - CBC with Differential/Platelet - CMP14+EGFR - Lipid panel - TSH - Ambulatory referral to Ophthalmology - Ambulatory referral to Gastroenterology  7. PTSD (post-traumatic stress disorder)  - DULoxetine (CYMBALTA) 30 MG capsule; Take 1 capsule (30 mg total) by mouth daily.  Dispense: 30 capsule; Refill: 3  Patient have been counseled extensively about nutrition and exercise. Other issues discussed during this visit include: low cholesterol diet, weight control and daily exercise, foot care, annual eye examinations at Ophthalmology, importance of adherence with medications and regular follow-up. We also discussed long term complications of uncontrolled diabetes and hypertension.   Return in about 3 months (around 02/16/2017) for Hemoglobin A1C and Follow up, DM, Follow up HTN, Depression and Anxiety.  The patient was given clear instructions to go to ER or return to medical center if symptoms don't improve, worsen or new problems develop. The patient verbalized understanding. The patient was told to call to get lab results if they haven't heard anything in the next week.   This note has been created with Surveyor, quantity. Any transcriptional errors are unintentional.    Angelica Chessman, MD, [place], [place], [place], [place] and [place], [place]   11/16/2016, 3:36 PM

## 2016-11-17 ENCOUNTER — Other Ambulatory Visit: Payer: Self-pay | Admitting: Pharmacist

## 2016-11-17 LAB — CMP14+EGFR
A/G RATIO: 1.4 (ref 1.2–2.2)
ALK PHOS: 142 IU/L — AB (ref 39–117)
ALT: 27 IU/L (ref 0–32)
AST: 15 IU/L (ref 0–40)
Albumin: 3.7 g/dL (ref 3.5–5.5)
BUN/Creatinine Ratio: 31 — ABNORMAL HIGH (ref 9–23)
BUN: 26 mg/dL — ABNORMAL HIGH (ref 6–24)
Bilirubin Total: 0.3 mg/dL (ref 0.0–1.2)
CALCIUM: 9.3 mg/dL (ref 8.7–10.2)
CHLORIDE: 103 mmol/L (ref 96–106)
CO2: 29 mmol/L (ref 18–29)
Creatinine, Ser: 0.85 mg/dL (ref 0.57–1.00)
GFR calc Af Amer: 89 mL/min/{1.73_m2} (ref >59)
GFR, EST NON AFRICAN AMERICAN: 77 mL/min/{1.73_m2} (ref >59)
Globulin, Total: 2.7 g/dL (ref 1.5–4.5)
Glucose: 192 mg/dL — ABNORMAL HIGH (ref 65–99)
POTASSIUM: 4.7 mmol/L (ref 3.5–5.2)
Sodium: 144 mmol/L (ref 134–144)
Total Protein: 6.4 g/dL (ref 6.0–8.5)

## 2016-11-17 LAB — CBC WITH DIFFERENTIAL/PLATELET
Basophils Absolute: 0 10*3/uL (ref 0.0–0.2)
Basos: 0 %
EOS (ABSOLUTE): 0.4 10*3/uL (ref 0.0–0.4)
Eos: 4 %
Hematocrit: 37.3 % (ref 34.0–46.6)
Hemoglobin: 11.8 g/dL (ref 11.1–15.9)
IMMATURE GRANULOCYTES: 0 %
Immature Grans (Abs): 0 10*3/uL (ref 0.0–0.1)
Lymphocytes Absolute: 4.6 10*3/uL — ABNORMAL HIGH (ref 0.7–3.1)
Lymphs: 46 %
MCH: 25.8 pg — ABNORMAL LOW (ref 26.6–33.0)
MCHC: 31.6 g/dL (ref 31.5–35.7)
MCV: 82 fL (ref 79–97)
Monocytes Absolute: 0.4 10*3/uL (ref 0.1–0.9)
Monocytes: 4 %
NEUTROS PCT: 46 %
Neutrophils Absolute: 4.6 10*3/uL (ref 1.4–7.0)
PLATELETS: 266 10*3/uL (ref 150–379)
RBC: 4.57 x10E6/uL (ref 3.77–5.28)
RDW: 14.7 % (ref 12.3–15.4)
WBC: 10 10*3/uL (ref 3.4–10.8)

## 2016-11-17 LAB — LIPID PANEL
CHOL/HDL RATIO: 2.5 ratio (ref 0.0–4.4)
CHOLESTEROL TOTAL: 140 mg/dL (ref 100–199)
HDL: 55 mg/dL (ref >39)
LDL Calculated: 65 mg/dL (ref 0–99)
TRIGLYCERIDES: 98 mg/dL (ref 0–149)
VLDL Cholesterol Cal: 20 mg/dL (ref 5–40)

## 2016-11-17 LAB — TSH: TSH: 2.63 u[IU]/mL (ref 0.450–4.500)

## 2016-11-17 MED ORDER — OXYBUTYNIN CHLORIDE 5 MG PO TABS: 5.0000 mg | ORAL_TABLET | Freq: Three times a day (TID) | ORAL | 2 refills | Status: DC

## 2016-11-22 ENCOUNTER — Telehealth: Payer: Self-pay | Admitting: *Deleted

## 2016-11-22 NOTE — Telephone Encounter (Signed)
-----   Message from Tresa Garter, MD sent at 11/18/2016  5:06 PM EDT ----- Please inform patient that her lab results are mostly normal except for high blood sugar. Please continue blood sugar control with current medications as prescribed. Follow up regularly as scheduled.

## 2016-11-30 ENCOUNTER — Encounter: Payer: Self-pay | Admitting: Pharmacist

## 2016-11-30 NOTE — Progress Notes (Signed)
Prior authorization completed for Onglyza through [place] [employer]. Approval [phone]

## 2016-12-30 ENCOUNTER — Other Ambulatory Visit: Payer: Self-pay | Admitting: Internal Medicine

## 2016-12-30 DIAGNOSIS — E1165 Type 2 diabetes mellitus with hyperglycemia: Secondary | ICD-10-CM

## 2017-01-20 ENCOUNTER — Inpatient Hospital Stay: Payer: Self-pay

## 2017-01-25 ENCOUNTER — Encounter: Payer: Self-pay | Admitting: Internal Medicine

## 2017-01-25 ENCOUNTER — Ambulatory Visit: Payer: Medicaid Other | Attending: Internal Medicine | Admitting: Internal Medicine

## 2017-01-25 VITALS — BP 147/81 | HR 87 | Temp 98.1°F | Resp 20 | Ht 66.0 in

## 2017-01-25 DIAGNOSIS — G894 Chronic pain syndrome: Secondary | ICD-10-CM | POA: Diagnosis not present

## 2017-01-25 DIAGNOSIS — I10 Essential (primary) hypertension: Secondary | ICD-10-CM | POA: Diagnosis not present

## 2017-01-25 DIAGNOSIS — I739 Peripheral vascular disease, unspecified: Secondary | ICD-10-CM

## 2017-01-25 DIAGNOSIS — N390 Urinary tract infection, site not specified: Secondary | ICD-10-CM | POA: Insufficient documentation

## 2017-01-25 DIAGNOSIS — E039 Hypothyroidism, unspecified: Secondary | ICD-10-CM | POA: Insufficient documentation

## 2017-01-25 DIAGNOSIS — R3981 Functional urinary incontinence: Secondary | ICD-10-CM | POA: Insufficient documentation

## 2017-01-25 DIAGNOSIS — Z880 Allergy status to penicillin: Secondary | ICD-10-CM | POA: Diagnosis not present

## 2017-01-25 DIAGNOSIS — N189 Chronic kidney disease, unspecified: Secondary | ICD-10-CM | POA: Insufficient documentation

## 2017-01-25 DIAGNOSIS — E1142 Type 2 diabetes mellitus with diabetic polyneuropathy: Secondary | ICD-10-CM | POA: Diagnosis not present

## 2017-01-25 DIAGNOSIS — I129 Hypertensive chronic kidney disease with stage 1 through stage 4 chronic kidney disease, or unspecified chronic kidney disease: Secondary | ICD-10-CM | POA: Diagnosis not present

## 2017-01-25 DIAGNOSIS — Z888 Allergy status to other drugs, medicaments and biological substances status: Secondary | ICD-10-CM | POA: Insufficient documentation

## 2017-01-25 DIAGNOSIS — G4700 Insomnia, unspecified: Secondary | ICD-10-CM | POA: Diagnosis not present

## 2017-01-25 DIAGNOSIS — Z89432 Acquired absence of left foot: Secondary | ICD-10-CM

## 2017-01-25 DIAGNOSIS — Z7982 Long term (current) use of aspirin: Secondary | ICD-10-CM | POA: Diagnosis not present

## 2017-01-25 DIAGNOSIS — E1122 Type 2 diabetes mellitus with diabetic chronic kidney disease: Secondary | ICD-10-CM | POA: Insufficient documentation

## 2017-01-25 DIAGNOSIS — E11621 Type 2 diabetes mellitus with foot ulcer: Secondary | ICD-10-CM | POA: Insufficient documentation

## 2017-01-25 DIAGNOSIS — Z79899 Other long term (current) drug therapy: Secondary | ICD-10-CM | POA: Diagnosis not present

## 2017-01-25 DIAGNOSIS — E669 Obesity, unspecified: Secondary | ICD-10-CM | POA: Diagnosis not present

## 2017-01-25 DIAGNOSIS — E11319 Type 2 diabetes mellitus with unspecified diabetic retinopathy without macular edema: Secondary | ICD-10-CM | POA: Insufficient documentation

## 2017-01-25 DIAGNOSIS — F4310 Post-traumatic stress disorder, unspecified: Secondary | ICD-10-CM | POA: Diagnosis not present

## 2017-01-25 DIAGNOSIS — E1165 Type 2 diabetes mellitus with hyperglycemia: Secondary | ICD-10-CM | POA: Diagnosis not present

## 2017-01-25 DIAGNOSIS — Z794 Long term (current) use of insulin: Secondary | ICD-10-CM | POA: Insufficient documentation

## 2017-01-25 DIAGNOSIS — E1151 Type 2 diabetes mellitus with diabetic peripheral angiopathy without gangrene: Secondary | ICD-10-CM | POA: Insufficient documentation

## 2017-01-25 DIAGNOSIS — L299 Pruritus, unspecified: Secondary | ICD-10-CM | POA: Insufficient documentation

## 2017-01-25 DIAGNOSIS — E785 Hyperlipidemia, unspecified: Secondary | ICD-10-CM | POA: Insufficient documentation

## 2017-01-25 DIAGNOSIS — F5101 Primary insomnia: Secondary | ICD-10-CM | POA: Diagnosis not present

## 2017-01-25 DIAGNOSIS — R0602 Shortness of breath: Secondary | ICD-10-CM | POA: Diagnosis not present

## 2017-01-25 DIAGNOSIS — N39498 Other specified urinary incontinence: Secondary | ICD-10-CM | POA: Insufficient documentation

## 2017-01-25 LAB — GLUCOSE, POCT (MANUAL RESULT ENTRY): POC GLUCOSE: 167 mg/dL — AB (ref 70–99)

## 2017-01-25 MED ORDER — CIPROFLOXACIN HCL 500 MG PO TABS: 500.0000 mg | ORAL_TABLET | Freq: Two times a day (BID) | ORAL | 0 refills | Status: DC

## 2017-01-25 MED ORDER — POTASSIUM CHLORIDE ER 10 MEQ PO TBCR: 10.0000 meq | EXTENDED_RELEASE_TABLET | Freq: Every day | ORAL | 3 refills | Status: DC

## 2017-01-25 MED ORDER — FUROSEMIDE 20 MG PO TABS: 20.0000 mg | ORAL_TABLET | Freq: Two times a day (BID) | ORAL | 3 refills | Status: DC

## 2017-01-25 MED ORDER — CLOTRIMAZOLE 1 % EX CREA: 1.0000 "application " | TOPICAL_CREAM | Freq: Two times a day (BID) | CUTANEOUS | 3 refills | Status: DC

## 2017-01-25 MED ORDER — OXYBUTYNIN CHLORIDE 5 MG PO TABS: 5.0000 mg | ORAL_TABLET | Freq: Three times a day (TID) | ORAL | 2 refills | Status: DC

## 2017-01-25 MED ORDER — TRIAMCINOLONE ACETONIDE 0.1 % EX CREA: 1.0000 "application " | TOPICAL_CREAM | Freq: Every day | CUTANEOUS | 2 refills | Status: DC | PRN

## 2017-01-25 MED ORDER — TRAZODONE HCL 50 MG PO TABS: 25.0000 mg | ORAL_TABLET | Freq: Every evening | ORAL | 3 refills | Status: DC | PRN

## 2017-01-25 NOTE — Patient Instructions (Signed)
Diabetes Mellitus and Food It is important for you to manage your blood sugar (glucose) level. Your blood glucose level can be greatly affected by what you eat. Eating healthier foods in the appropriate amounts throughout the day at about the same time each day will help you control your blood glucose level. It can also help slow or prevent worsening of your diabetes mellitus. Healthy eating may even help you improve the level of your blood pressure and reach or maintain a healthy weight. General recommendations for healthful eating and cooking habits include:  Eating meals and snacks regularly. Avoid going long periods of time without eating to lose weight.  Eating a diet that consists mainly of plant-based foods, such as fruits, vegetables, nuts, legumes, and whole grains.  Using low-heat cooking methods, such as baking, instead of high-heat cooking methods, such as deep frying.  Work with your dietitian to make sure you understand how to use the Nutrition Facts information on food labels. How can food affect me? Carbohydrates Carbohydrates affect your blood glucose level more than any other type of food. Your dietitian will help you determine how many carbohydrates to eat at each meal and teach you how to count carbohydrates. Counting carbohydrates is important to keep your blood glucose at a healthy level, especially if you are using insulin or taking certain medicines for diabetes mellitus. Alcohol Alcohol can cause sudden decreases in blood glucose (hypoglycemia), especially if you use insulin or take certain medicines for diabetes mellitus. Hypoglycemia can be a life-threatening condition. Symptoms of hypoglycemia (sleepiness, dizziness, and disorientation) are similar to symptoms of having too much alcohol. If your health care provider has given you approval to drink alcohol, do so in moderation and use the following guidelines:  Women should not have more than one drink per day, and men  should not have more than two drinks per day. One drink is equal to: ? 12 oz of beer. ? 5 oz of wine. ? 1 oz of hard liquor.  Do not drink on an empty stomach.  Keep yourself hydrated. Have water, diet soda, or unsweetened iced tea.  Regular soda, juice, and other mixers might contain a lot of carbohydrates and should be counted.  What foods are not recommended? As you make food choices, it is important to remember that all foods are not the same. Some foods have fewer nutrients per serving than other foods, even though they might have the same number of calories or carbohydrates. It is difficult to get your body what it needs when you eat foods with fewer nutrients. Examples of foods that you should avoid that are high in calories and carbohydrates but low in nutrients include:  Trans fats (most processed foods list trans fats on the Nutrition Facts label).  Regular soda.  Juice.  Candy.  Sweets, such as cake, pie, doughnuts, and cookies.  Fried foods.  What foods can I eat? Eat nutrient-rich foods, which will nourish your body and keep you healthy. The food you should eat also will depend on several factors, including:  The calories you need.  The medicines you take.  Your weight.  Your blood glucose level.  Your blood pressure level.  Your cholesterol level.  You should eat a variety of foods, including:  Protein. ? Lean cuts of meat. ? Proteins low in saturated fats, such as fish, egg whites, and beans. Avoid processed meats.  Fruits and vegetables. ? Fruits and vegetables that may help control blood glucose levels, such as apples,   mangoes, and yams.  Dairy products. ? Choose fat-free or low-fat dairy products, such as milk, yogurt, and cheese.  Grains, bread, pasta, and rice. ? Choose whole grain products, such as multigrain bread, whole oats, and brown rice. These foods may help control blood pressure.  Fats. ? Foods containing healthful fats, such as  nuts, avocado, olive oil, canola oil, and fish.  Does everyone with diabetes mellitus have the same meal plan? Because every person with diabetes mellitus is different, there is not one meal plan that works for everyone. It is very important that you meet with a dietitian who will help you create a meal plan that is just right for you. This information is not intended to replace advice given to you by your health care provider. Make sure you discuss any questions you have with your health care provider. Document Released: 03/24/2005 Document Revised: 12/03/2015 Document Reviewed: 05/24/2013 Elsevier Interactive Patient Education  2017 [employer] Inc.  

## 2017-01-25 NOTE — Progress Notes (Signed)
Debbie [name], is a 55 y.o. female  OAC:[id number]  WFU:[id number]  DOB - [birth year]  Chief Complaint  Patient presents with  . Hospitalization Follow-up      Subjective:   Debbie [name] is a 56 y.o. female with history of type 2 diabetes mellitus with neuropathy and retinopathy, peripheral arterial disease, hypertension, hyperlipidemia, major depression and anxiety/PTSD here today for a follow up ED visit. Patient was recently seen at the ED for lower abdominal swelling and redness, associated with pains. She was noticed to have fluid all over (anarsaca) and was given furosemide and antibiotics for possible abdominal wall cellulitis. She is getting better, here today for further review and follow up. She still has significant abdominal wall swelling and redness, associated with pain but no open wound. She does not have fever, no chest pain. Per her sister, patient continues to have significant urinary incontinence and patient is getting more frustrated by the day due to her many co morbidities and chronic medical conditions. Her urine has had strong odor lately, and is turbid. Patient has No headache, No chest pain. No SOB. She is s/p bilateral TMA. Last HbA1C was 9.5%.  Problem  Primary Insomnia  Sob (Shortness of Breath)  Itching With Irritation  Urinary Incontinence Due to Immobility   ALLERGIES: Allergies  Allergen Reactions  . Penicillins Rash    Has patient had a PCN reaction causing immediate rash, facial/tongue/throat swelling, SOB or lightheadedness with hypotension: Yes Has patient had a PCN reaction causing severe rash involving mucus membranes or skin necrosis: No Has patient had a PCN reaction that required hospitalization No Has patient had a PCN reaction occurring within the last 10 years: No If all of the above answers are "NO", then may proceed with Cephalosporin use.   Marland Kitchen Hydrocodone Rash  . Hydrocortisone Rash    Redness  . Latex Itching and Other (See Comments)      Redness  . Tylenol With Codeine #3 [Acetaminophen-Codeine] Swelling and Rash    SWELLING REACTION UNSPECIFIED     PAST MEDICAL HISTORY: Past Medical History:  Diagnosis Date  . Anemia   . Blind   . Cataract   . Chest pain    a. ? MI in 2010, [place]->medically managed  . Chronic kidney disease    acute renal disease - 02/2016- "from bleeding so much"  . Depression   . Diabetic foot ulcer ([place])   . History of blood transfusion    02/2016 abnormal bleeding vagina  . Hypertension    x ~ 10 years  . Hypothyroidism    no medication  . Neuropathy   . Obesity   . Shortness of breath dyspnea    with movement  . Type II or unspecified type diabetes mellitus with unspecified complication, uncontrolled    x ~ 10 years    MEDICATIONS AT HOME: Prior to Admission medications   Medication Sig Start Date End Date Taking? Authorizing Provider  aspirin 81 MG EC tablet Take 1 tablet (81 mg total) by mouth daily. 11/16/16  Yes Tresa Garter, MD  atorvastatin (LIPITOR) 40 MG tablet Take 1 tablet (40 mg total) by mouth every morning. 11/16/16  Yes Tresa Garter, MD  B-D INS SYR ULTRAFINE 1CC/31G 31G X 5/16" 1 ML MISC USE AS DIRECTED WITH LANTUS AND NOVOLOG INJECTIONS 10/31/16  Yes Sumire Halbleib E, MD  clotrimazole (LOTRIMIN) 1 % cream Apply 1 application topically 2 (two) times daily. 01/25/17  Yes Tresa Garter, MD  diphenhydrAMINE (BENADRYL)  25 MG tablet Take 1 tablet (25 mg total) by mouth every 6 (six) hours as needed (for allergies.). 08/10/16  Yes Timouthy Gilardi E, MD  DULoxetine (CYMBALTA) 30 MG capsule Take 1 capsule (30 mg total) by mouth daily. 11/16/16  Yes Tresa Garter, MD  gabapentin (NEURONTIN) 300 MG capsule Take 1 capsule (300 mg total) by mouth 4 (four) times daily. 11/16/16  Yes Kealan Buchan E, MD  glucose blood (ACCU-CHEK AVIVA PLUS) test strip Check blood sugar 3 times daily. Patient taking differently: 1 each by Other route See admin  instructions. Check blood sugar once daily 01/28/16  Yes Avelina Mcclurkin E, MD  Lancets (ACCU-CHEK SOFT TOUCH) lancets Use as instructed Patient taking differently: 1 each by Other route See admin instructions. Check blood sugar once daily 12/31/15  Yes Kiyonna Tortorelli E, MD  LANTUS 100 UNIT/ML injection INJECT 0.2 MILLILITERS INTO THE SKIN AT BEDTIME 09/27/16  Yes Jaire Pinkham E, MD  loteprednol (LOTEMAX) 0.5 % ophthalmic suspension Place 1 drop into both eyes 2 (two) times daily. 01/09/14  Yes Tresa Garter, MD  megestrol (MEGACE) 40 MG tablet Take 1 tablet (40 mg total) by mouth 2 (two) times daily. Reduce to once daily one week after bleeding stops Patient taking differently: Take 40 mg by mouth daily.  03/02/16  Yes Pollina, Gwenyth Allegra, MD  nitroGLYCERIN (NITRODUR - DOSED IN MG/24 HR) 0.2 mg/hr patch Place 1 patch (0.2 mg total) onto the skin daily. Apply patch to the top of the ankle. Change patch daily change location daily 05/20/16  Yes Newt Minion, MD  nitroGLYCERIN (NITRODUR - DOSED IN MG/24 HR) 0.2 mg/hr patch Place 1 patch (0.2 mg total) onto the skin daily. 05/30/16  Yes Newt Minion, MD  NOVOLOG 100 UNIT/ML injection INJECT 3 TIMES DAILY FOR BLOOD SUGAR 70-120=0 UNITS,121-150=2 UNITS,151-200= 3 UNITS,201-250=5 UNITS,251-300=8 UNITS,301-350=11 UNITS,351&OV 12/30/16  Yes Ailana Cuadrado E, MD  oxybutynin (DITROPAN) 5 MG tablet Take 1 tablet (5 mg total) by mouth 3 (three) times daily. 01/25/17  Yes Tresa Garter, MD  oxyCODONE-acetaminophen (ROXICET) 5-325 MG tablet Take 1 tablet by mouth every 4 (four) hours as needed for severe pain. 04/22/16  Yes Newt Minion, MD  propranolol ER (INDERAL LA) 60 MG 24 hr capsule Take 1 capsule (60 mg total) by mouth daily. 11/16/16  Yes Brodrick Curran E, MD  saxagliptin HCl (ONGLYZA) 2.5 MG TABS tablet Take 1 tablet (2.5 mg total) by mouth 2 (two) times daily. 11/16/16  Yes Tresa Garter, MD  senna (SENOKOT) 8.6 MG  TABS tablet Take 2 tablets (17.2 mg total) by mouth at bedtime as needed (for constipation). 07/23/15  Yes Willeen Niece, MD  triamcinolone cream (KENALOG) 0.1 % Apply 1 application topically daily as needed (for itching). 01/25/17  Yes Tresa Garter, MD  ciprofloxacin (CIPRO) 500 MG tablet Take 1 tablet (500 mg total) by mouth 2 (two) times daily. 01/25/17   Tresa Garter, MD  furosemide (LASIX) 20 MG tablet Take 1 tablet (20 mg total) by mouth 2 (two) times daily. 01/25/17   Tresa Garter, MD  potassium chloride (K-DUR) 10 MEQ tablet Take 1 tablet (10 mEq total) by mouth daily. 01/25/17   Tresa Garter, MD  traZODone (DESYREL) 50 MG tablet Take 0.5-1 tablets (25-50 mg total) by mouth at bedtime as needed for sleep. 01/25/17   Tresa Garter, MD    Objective:   Vitals:   01/25/17 1424  BP: (!) 147/81  Pulse: 87  Resp: 20  Temp: 98.1 F (36.7 C)  TempSrc: Oral  SpO2: 97%  Height: 5\' 6"  (1.676 m)   Exam General appearance : Awake, alert, not in any distress. Speech Clear. Not toxic looking, obese HEENT: Atraumatic and Normocephalic, pupils equally reactive to light and accomodation, blind Neck: Supple, no JVD. No cervical lymphadenopathy.  Chest: Good air entry bilaterally, no added sounds  CVS: S1 S2 regular, no murmurs.  Abdomen: Edematous anterior abdominal wall, warm to touch, slightly tender, Bowel sounds present, with no gaurding, rigidity or rebound. Extremities: B/L Lower Ext TMA, both legs are warm to touch Neurology: Awake alert, and oriented X 3, CN II-XII intact, Non focal Skin: No Rash  Data Review Lab Results  Component Value Date   HGBA1C 9.5 11/16/2016   HGBA1C 8.0 (H) 05/20/2016   HGBA1C 6.4 03/11/2016   Assessment & Plan   1. Type 2 diabetes mellitus with hyperglycemia, unspecified whether long term insulin use (HCC)  - Glucose (CBG)  Aim for 30 minutes of exercise most days. Rethink what you drink. Water is great! Aim for  2-3 Carb Choices per meal (30-45 grams) +/- 1 either way  Aim for 0-15 Carbs per snack if hungry  Include protein in moderation with your meals and snacks  Consider reading food labels for Total Carbohydrate and Fat Grams of foods  Consider checking BG at alternate times per day  Continue taking medication as directed Be mindful about how much sugar you are adding to beverages and other foods. Fruit Punch - find one with no sugar  Measure and decrease portions of carbohydrate foods  Make your plate and don't go back for seconds  2. Essential hypertension  We have discussed target BP range and blood pressure goal. I have advised patient to check BP regularly and to call us back or report to clinic if the numbers are consistently higher than 140/90. We discussed the importance of compliance with medical therapy and DASH diet recommended, consequences of uncontrolled hypertension discussed.  - continue current BP medications  6. Urinary incontinence due to immobility  - Ambulatory referral to Urology  - oxybutynin (DITROPAN) 5 MG tablet; Take 1 tablet (5 mg total) by mouth 3 (three) times daily.  Dispense: 90 tablet; Refill: 2 - ciprofloxacin (CIPRO) 500 MG tablet; Take 1 tablet (500 mg total) by mouth 2 (two) times daily.  Dispense: 20 tablet; Refill: 0 for UTI   7. Itching with irritation  - clotrimazole (LOTRIMIN) 1 % cream; Apply 1 application topically 2 (two) times daily.  Dispense: 60 g; Refill: 3 - triamcinolone cream (KENALOG) 0.1 %; Apply 1 application topically daily as needed (for itching).  Dispense: 80 g; Refill: 2  8. SOB (shortness of breath)  - ECHOCARDIOGRAM COMPLETE; Future  - potassium chloride (K-DUR) 10 MEQ tablet; Take 1 tablet (10 mEq total) by mouth daily.  Dispense: 30 tablet; Refill: 3 - furosemide (LASIX) 20 MG tablet; Take 1 tablet (20 mg total) by mouth 2 (two) times daily.  Dispense: 60 tablet; Refill: 3  9. Primary insomnia  - traZODone (DESYREL) 50  MG tablet; Take 0.5-1 tablets (25-50 mg total) by mouth at bedtime as needed for sleep.  Dispense: 30 tablet; Refill: 3  Patient have been counseled extensively about nutrition and exercise. Other issues discussed during this visit include: low cholesterol diet, weight control and daily exercise, foot care, annual eye examinations at Ophthalmology, importance of adherence with medications and regular follow-up. We also discussed long term  complications of uncontrolled diabetes and hypertension.   Return in about 3 months (around 04/27/2017) for Hemoglobin A1C and Follow up, DM, Follow up HTN, Follow up Pain and comorbidities.  The patient was given clear instructions to go to ER or return to medical center if symptoms don't improve, worsen or new problems develop. The patient verbalized understanding. The patient was told to call to get lab results if they haven't heard anything in the next week.   This note has been created with Surveyor, quantity. Any transcriptional errors are unintentional.    Angelica Chessman, MD, [place], [place], [place], [facility] and [place], [place]   01/25/2017, 3:41 PM

## 2017-01-25 NOTE — Progress Notes (Signed)
167 

## 2017-01-31 ENCOUNTER — Ambulatory Visit (HOSPITAL_COMMUNITY): Admission: RE | Admit: 2017-01-31 | Payer: Medicaid Other | Source: Ambulatory Visit

## 2017-02-06 ENCOUNTER — Other Ambulatory Visit: Payer: Self-pay | Admitting: Internal Medicine

## 2017-02-06 DIAGNOSIS — I10 Essential (primary) hypertension: Secondary | ICD-10-CM

## 2017-02-06 DIAGNOSIS — E1165 Type 2 diabetes mellitus with hyperglycemia: Secondary | ICD-10-CM

## 2017-03-07 ENCOUNTER — Other Ambulatory Visit: Payer: Self-pay | Admitting: Internal Medicine

## 2017-03-07 DIAGNOSIS — L299 Pruritus, unspecified: Secondary | ICD-10-CM

## 2017-03-08 ENCOUNTER — Ambulatory Visit: Payer: Medicaid Other | Attending: Internal Medicine | Admitting: Internal Medicine

## 2017-03-08 VITALS — BP 140/79 | HR 96 | Temp 98.1°F | Resp 18 | Ht 60.0 in

## 2017-03-08 DIAGNOSIS — E11319 Type 2 diabetes mellitus with unspecified diabetic retinopathy without macular edema: Secondary | ICD-10-CM | POA: Insufficient documentation

## 2017-03-08 DIAGNOSIS — Z7902 Long term (current) use of antithrombotics/antiplatelets: Secondary | ICD-10-CM | POA: Diagnosis not present

## 2017-03-08 DIAGNOSIS — R0602 Shortness of breath: Secondary | ICD-10-CM | POA: Diagnosis not present

## 2017-03-08 DIAGNOSIS — E669 Obesity, unspecified: Secondary | ICD-10-CM | POA: Diagnosis not present

## 2017-03-08 DIAGNOSIS — F329 Major depressive disorder, single episode, unspecified: Secondary | ICD-10-CM | POA: Diagnosis not present

## 2017-03-08 DIAGNOSIS — Z993 Dependence on wheelchair: Secondary | ICD-10-CM | POA: Insufficient documentation

## 2017-03-08 DIAGNOSIS — E1151 Type 2 diabetes mellitus with diabetic peripheral angiopathy without gangrene: Secondary | ICD-10-CM | POA: Insufficient documentation

## 2017-03-08 DIAGNOSIS — F5101 Primary insomnia: Secondary | ICD-10-CM | POA: Diagnosis not present

## 2017-03-08 DIAGNOSIS — I10 Essential (primary) hypertension: Secondary | ICD-10-CM | POA: Diagnosis not present

## 2017-03-08 DIAGNOSIS — Z79899 Other long term (current) drug therapy: Secondary | ICD-10-CM | POA: Diagnosis not present

## 2017-03-08 DIAGNOSIS — E1142 Type 2 diabetes mellitus with diabetic polyneuropathy: Secondary | ICD-10-CM | POA: Insufficient documentation

## 2017-03-08 DIAGNOSIS — E039 Hypothyroidism, unspecified: Secondary | ICD-10-CM | POA: Insufficient documentation

## 2017-03-08 DIAGNOSIS — Z880 Allergy status to penicillin: Secondary | ICD-10-CM | POA: Insufficient documentation

## 2017-03-08 DIAGNOSIS — Z794 Long term (current) use of insulin: Secondary | ICD-10-CM | POA: Insufficient documentation

## 2017-03-08 DIAGNOSIS — Z7982 Long term (current) use of aspirin: Secondary | ICD-10-CM | POA: Insufficient documentation

## 2017-03-08 DIAGNOSIS — E1165 Type 2 diabetes mellitus with hyperglycemia: Secondary | ICD-10-CM

## 2017-03-08 DIAGNOSIS — F4310 Post-traumatic stress disorder, unspecified: Secondary | ICD-10-CM | POA: Diagnosis not present

## 2017-03-08 DIAGNOSIS — R32 Unspecified urinary incontinence: Secondary | ICD-10-CM | POA: Diagnosis not present

## 2017-03-08 DIAGNOSIS — H548 Legal blindness, as defined in USA: Secondary | ICD-10-CM | POA: Insufficient documentation

## 2017-03-08 DIAGNOSIS — I739 Peripheral vascular disease, unspecified: Secondary | ICD-10-CM

## 2017-03-08 LAB — POCT GLYCOSYLATED HEMOGLOBIN (HGB A1C): HEMOGLOBIN A1C: 7.8

## 2017-03-08 LAB — GLUCOSE, POCT (MANUAL RESULT ENTRY): POC GLUCOSE: 74 mg/dL (ref 70–99)

## 2017-03-08 MED ORDER — GABAPENTIN 300 MG PO CAPS: 300.0000 mg | ORAL_CAPSULE | Freq: Four times a day (QID) | ORAL | 3 refills | Status: DC

## 2017-03-08 MED ORDER — TRAZODONE HCL 50 MG PO TABS: 25.0000 mg | ORAL_TABLET | Freq: Every evening | ORAL | 3 refills | Status: DC | PRN

## 2017-03-08 MED ORDER — CARVEDILOL 12.5 MG PO TABS: 12.5000 mg | ORAL_TABLET | Freq: Two times a day (BID) | ORAL | 3 refills | Status: DC

## 2017-03-08 MED ORDER — DULOXETINE HCL 30 MG PO CPEP: 30.0000 mg | ORAL_CAPSULE | Freq: Every day | ORAL | 3 refills | Status: DC

## 2017-03-08 MED ORDER — CLOPIDOGREL BISULFATE 75 MG PO TABS: ORAL_TABLET | ORAL | 3 refills | Status: DC

## 2017-03-08 MED ORDER — ATORVASTATIN CALCIUM 40 MG PO TABS: 40.0000 mg | ORAL_TABLET | ORAL | 3 refills | Status: DC

## 2017-03-08 MED ORDER — FUROSEMIDE 20 MG PO TABS: 20.0000 mg | ORAL_TABLET | Freq: Two times a day (BID) | ORAL | 3 refills | Status: DC

## 2017-03-08 MED ORDER — POTASSIUM CHLORIDE ER 10 MEQ PO TBCR: 10.0000 meq | EXTENDED_RELEASE_TABLET | Freq: Every day | ORAL | 3 refills | Status: DC

## 2017-03-08 MED ORDER — INSULIN GLARGINE 100 UNIT/ML ~~LOC~~ SOLN: 15.0000 [IU] | Freq: Every day | SUBCUTANEOUS | 3 refills | Status: DC

## 2017-03-08 MED ORDER — SAXAGLIPTIN HCL 2.5 MG PO TABS: 2.5000 mg | ORAL_TABLET | Freq: Two times a day (BID) | ORAL | 3 refills | Status: DC

## 2017-03-08 NOTE — Patient Instructions (Signed)
Diabetes Mellitus and Food It is important for you to manage your blood sugar (glucose) level. Your blood glucose level can be greatly affected by what you eat. Eating healthier foods in the appropriate amounts throughout the day at about the same time each day will help you control your blood glucose level. It can also help slow or prevent worsening of your diabetes mellitus. Healthy eating may even help you improve the level of your blood pressure and reach or maintain a healthy weight. General recommendations for healthful eating and cooking habits include:  Eating meals and snacks regularly. Avoid going long periods of time without eating to lose weight.  Eating a diet that consists mainly of plant-based foods, such as fruits, vegetables, nuts, legumes, and whole grains.  Using low-heat cooking methods, such as baking, instead of high-heat cooking methods, such as deep frying.  Work with your dietitian to make sure you understand how to use the Nutrition Facts information on food labels. How can food affect me? Carbohydrates Carbohydrates affect your blood glucose level more than any other type of food. Your dietitian will help you determine how many carbohydrates to eat at each meal and teach you how to count carbohydrates. Counting carbohydrates is important to keep your blood glucose at a healthy level, especially if you are using insulin or taking certain medicines for diabetes mellitus. Alcohol Alcohol can cause sudden decreases in blood glucose (hypoglycemia), especially if you use insulin or take certain medicines for diabetes mellitus. Hypoglycemia can be a life-threatening condition. Symptoms of hypoglycemia (sleepiness, dizziness, and disorientation) are similar to symptoms of having too much alcohol. If your health care provider has given you approval to drink alcohol, do so in moderation and use the following guidelines:  Women should not have more than one drink per day, and men  should not have more than two drinks per day. One drink is equal to: ? 12 oz of beer. ? 5 oz of wine. ? 1 oz of hard liquor.  Do not drink on an empty stomach.  Keep yourself hydrated. Have water, diet soda, or unsweetened iced tea.  Regular soda, juice, and other mixers might contain a lot of carbohydrates and should be counted.  What foods are not recommended? As you make food choices, it is important to remember that all foods are not the same. Some foods have fewer nutrients per serving than other foods, even though they might have the same number of calories or carbohydrates. It is difficult to get your body what it needs when you eat foods with fewer nutrients. Examples of foods that you should avoid that are high in calories and carbohydrates but low in nutrients include:  Trans fats (most processed foods list trans fats on the Nutrition Facts label).  Regular soda.  Juice.  Candy.  Sweets, such as cake, pie, doughnuts, and cookies.  Fried foods.  What foods can I eat? Eat nutrient-rich foods, which will nourish your body and keep you healthy. The food you should eat also will depend on several factors, including:  The calories you need.  The medicines you take.  Your weight.  Your blood glucose level.  Your blood pressure level.  Your cholesterol level.  You should eat a variety of foods, including:  Protein. ? Lean cuts of meat. ? Proteins low in saturated fats, such as fish, egg whites, and beans. Avoid processed meats.  Fruits and vegetables. ? Fruits and vegetables that may help control blood glucose levels, such as apples,   mangoes, and yams.  Dairy products. ? Choose fat-free or low-fat dairy products, such as milk, yogurt, and cheese.  Grains, bread, pasta, and rice. ? Choose whole grain products, such as multigrain bread, whole oats, and brown rice. These foods may help control blood pressure.  Fats. ? Foods containing healthful fats, such as  nuts, avocado, olive oil, canola oil, and fish.  Does everyone with diabetes mellitus have the same meal plan? Because every person with diabetes mellitus is different, there is not one meal plan that works for everyone. It is very important that you meet with a dietitian who will help you create a meal plan that is just right for you. This information is not intended to replace advice given to you by your health care provider. Make sure you discuss any questions you have with your health care provider. Document Released: 03/24/2005 Document Revised: 12/03/2015 Document Reviewed: 05/24/2013 Elsevier Interactive Patient Education  2017 [place]. Blood Glucose Monitoring, Adult Monitoring your blood sugar (glucose) helps you manage your diabetes. It also helps you and your health care provider determine how well your diabetes management plan is working. Blood glucose monitoring involves checking your blood glucose as often as directed, and keeping a record (log) of your results over time. Why should I monitor my blood glucose? Checking your blood glucose regularly can:  Help you understand how food, exercise, illnesses, and medicines affect your blood glucose.  Let you know what your blood glucose is at any time. You can quickly tell if you are having low blood glucose (hypoglycemia) or high blood glucose (hyperglycemia).  Help you and your health care provider adjust your medicines as needed.  When should I check my blood glucose? Follow instructions from your health care provider about how often to check your blood glucose. This may depend on:  The type of diabetes you have.  How well-controlled your diabetes is.  Medicines you are taking.  If you have type 1 diabetes:  Check your blood glucose at least 2 times a day.  Also check your blood glucose: ? Before every insulin injection. ? Before and after exercise. ? Between meals. ? 2 hours after a meal. ? Occasionally between  2:00 a.m. and 3:00 a.m., as directed. ? Before potentially dangerous tasks, like driving or using heavy machinery. ? At bedtime.  You may need to check your blood glucose more often, up to 6-10 times a day: ? If you use an insulin pump. ? If you need multiple daily injections (MDI). ? If your diabetes is not well-controlled. ? If you are ill. ? If you have a history of severe hypoglycemia. ? If you have a history of not knowing when your blood glucose is getting low (hypoglycemia unawareness). If you have type 2 diabetes:  If you take insulin or other diabetes medicines, check your blood glucose at least 2 times a day.  If you are on intensive insulin therapy, check your blood glucose at least 4 times a day. Occasionally, you may also need to check between 2:00 a.m. and 3:00 a.m., as directed.  Also check your blood glucose: ? Before and after exercise. ? Before potentially dangerous tasks, like driving or using heavy machinery.  You may need to check your blood glucose more often if: ? Your medicine is being adjusted. ? Your diabetes is not well-controlled. ? You are ill. What is a blood glucose log?  A blood glucose log is a record of your blood glucose readings. It helps you  and your health care provider: ? Look for patterns in your blood glucose over time. ? Adjust your diabetes management plan as needed.  Every time you check your blood glucose, write down your result and notes about things that may be affecting your blood glucose, such as your diet and exercise for the day.  Most glucose meters store a record of glucose readings in the meter. Some meters allow you to download your records to a computer. How do I check my blood glucose? Follow these steps to get accurate readings of your blood glucose: Supplies needed   Blood glucose meter.  Test strips for your meter. Each meter has its own strips. You must use the strips that come with your meter.  A needle to prick  your finger (lancet). Do not use lancets more than once.  A device that holds the lancet (lancing device).  A journal or log book to write down your results. Procedure  Wash your hands with soap and water.  Prick the side of your finger (not the tip) with the lancet. Use a different finger each time.  Gently rub the finger until a small drop of blood appears.  Follow instructions that come with your meter for inserting the test strip, applying blood to the strip, and using your blood glucose meter.  Write down your result and any notes. Alternative testing sites  Some meters allow you to use areas of your body other than your finger (alternative sites) to test your blood.  If you think you may have hypoglycemia, or if you have hypoglycemia unawareness, do not use alternative sites. Use your finger instead.  Alternative sites may not be as accurate as the fingers, because blood flow is slower in these areas. This means that the result you get may be delayed, and it may be different from the result that you would get from your finger.  The most common alternative sites are: ? Forearm. ? Thigh. ? Palm of the hand. Additional tips  Always keep your supplies with you.  If you have questions or need help, all blood glucose meters have a 24-hour "hotline" number that you can call. You may also contact your health care provider.  After you use a few boxes of test strips, adjust (calibrate) your blood glucose meter by following instructions that came with your meter. This information is not intended to replace advice given to you by your health care provider. Make sure you discuss any questions you have with your health care provider. Document Released: 06/30/2003 Document Revised: 01/15/2016 Document Reviewed: 12/07/2015 Elsevier Interactive Patient Education  2017 [employer].

## 2017-03-08 NOTE — Progress Notes (Signed)
Debbie [name], is a 56 y.o. female  WPY:[id number]  KNL:[id number]  DOB - [birth year]  Chief Complaint  Patient presents with  . Follow-up      Subjective:   Debbie [name] is a 56 y.o. 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 Major complaint today is centered around abdominal wall edema and lower limb edema. Patient does not ambulate much but have shortness of breath, she is currently on Lasix but ran out recently. She has appointments with specialists coming up. Echocardiogram was ordered at the last visit but patient was no showed for appointment. She does not have fever, no chest pain. Per her sister, patient continues to have significant urinary incontinence and patient is getting more frustrated by the day due to her many co morbidities and chronic medical conditions. There is significant limitation in the care for this patient because of multiple factors including transportation and the fact that she lives out of town [place]). She is s/p bilateral TMA. She is legally blind. Wheelchair bound. She is obese. She speaks only Arabic. She is cared for by her sister who also have multiple medical conditions and sole caregiver for other members of the family.   ALLERGIES: Allergies  Allergen Reactions  . Penicillins Rash    Has patient had a PCN reaction causing immediate rash, facial/tongue/throat swelling, SOB or lightheadedness with hypotension: Yes Has patient had a PCN reaction causing severe rash involving mucus membranes or skin necrosis: No Has patient had a PCN reaction that required hospitalization No Has patient had a PCN reaction occurring within the last 10 years: No If all of the above answers are "NO", then may proceed with Cephalosporin use.   Marland Kitchen Hydrocodone Rash    . Hydrocortisone Rash    Redness  . Latex Itching and Other (See Comments)    Redness  . Tylenol With Codeine #3 [Acetaminophen-Codeine] Swelling and Rash    SWELLING REACTION UNSPECIFIED     PAST MEDICAL HISTORY: Past Medical History:  Diagnosis Date  . Anemia   . Blind   . Cataract   . Chest pain    a. ? MI in 2010, [place]->medically managed  . Chronic kidney disease    acute renal disease - 02/2016- "from bleeding so much"  . Depression   . Diabetic foot ulcer ([place])   . History of blood transfusion    02/2016 abnormal bleeding vagina  . Hypertension    x ~ 10 years  . Hypothyroidism    no medication  . Neuropathy   . Obesity   . Shortness of breath dyspnea    with movement  . Type II or unspecified type diabetes mellitus with unspecified complication, uncontrolled    x ~ 10 years    MEDICATIONS AT HOME: Prior to Admission medications   Medication Sig Start Date End Date Taking? Authorizing Provider  ACCU-CHEK AVIVA PLUS test strip CHECK BLOOD SUGAR THREE TIMES DAILY 02/06/17  Yes Tresa Garter, MD  aspirin 81 MG EC tablet Take 1 tablet (81 mg total) by mouth daily. 11/16/16  Yes Tresa Garter, MD  atorvastatin (LIPITOR) 40 MG tablet Take 1 tablet (40 mg total) by mouth every morning. 03/08/17  Yes Shanice Poznanski E, MD  B-D INS SYR ULTRAFINE 1CC/31G 31G X 5/16" 1 ML MISC USE AS DIRECTED WITH LANTUS AND NOVOLOG INJECTIONS  10/31/16  Yes Tresa Garter, MD  carvedilol (COREG) 12.5 MG tablet Take 1 tablet (12.5 mg total) by mouth 2 (two) times daily with a meal. 03/08/17  Yes Shelita Steptoe E, MD  clopidogrel (PLAVIX) 75 MG tablet TAKE ONE (1) TABLET BY MOUTH EVERY DAY 03/08/17  Yes Deeya Richeson E, MD  clotrimazole (LOTRIMIN) 1 % cream Apply 1 application topically 2 (two) times daily. 01/25/17  Yes Tresa Garter, MD  diphenhydrAMINE (BENADRYL) 25 MG tablet Take 1 tablet (25 mg total) by mouth every 6 (six) hours as needed (for allergies.).  08/10/16  Yes Marvie Brevik E, MD  DULoxetine (CYMBALTA) 30 MG capsule Take 1 capsule (30 mg total) by mouth daily. 03/08/17  Yes Tresa Garter, MD  furosemide (LASIX) 20 MG tablet Take 1 tablet (20 mg total) by mouth 2 (two) times daily. 03/08/17  Yes Tresa Garter, MD  gabapentin (NEURONTIN) 300 MG capsule Take 1 capsule (300 mg total) by mouth 4 (four) times daily. 03/08/17  Yes Tayten Bergdoll E, MD  insulin glargine (LANTUS) 100 UNIT/ML injection Inject 0.15 mLs (15 Units total) into the skin at bedtime. 03/08/17  Yes Tresa Garter, MD  Lancets (ACCU-CHEK SOFT TOUCH) lancets Use as instructed Patient taking differently: 1 each by Other route See admin instructions. Check blood sugar once daily 12/31/15  Yes Kaysie Michelini E, MD  loteprednol (LOTEMAX) 0.5 % ophthalmic suspension Place 1 drop into both eyes 2 (two) times daily. 01/09/14  Yes Tresa Garter, MD  megestrol (MEGACE) 40 MG tablet Take 1 tablet (40 mg total) by mouth 2 (two) times daily. Reduce to once daily one week after bleeding stops Patient taking differently: Take 40 mg by mouth daily.  03/02/16  Yes Pollina, Gwenyth Allegra, MD  nitroGLYCERIN (NITRODUR - DOSED IN MG/24 HR) 0.2 mg/hr patch Place 1 patch (0.2 mg total) onto the skin daily. Apply patch to the top of the ankle. Change patch daily change location daily 05/20/16  Yes Newt Minion, MD  NOVOLOG 100 UNIT/ML injection INJECT 3 TIMES DAILY FOR BLOOD SUGAR 70-120=0 UNITS,121-150=2 UNITS,151-200= 3 UNITS,201-250=5 UNITS,251-300=8 UNITS,301-350=11 UNITS,351&OV 12/30/16  Yes Navah Grondin E, MD  oxybutynin (DITROPAN) 5 MG tablet Take 1 tablet (5 mg total) by mouth 3 (three) times daily. 01/25/17  Yes Tresa Garter, MD  potassium chloride (K-DUR) 10 MEQ tablet Take 1 tablet (10 mEq total) by mouth daily. 03/08/17  Yes Tresa Garter, MD  saxagliptin HCl (ONGLYZA) 2.5 MG TABS tablet Take 1 tablet (2.5 mg total) by mouth 2 (two) times  daily. 03/08/17  Yes Tresa Garter, MD  senna (SENOKOT) 8.6 MG TABS tablet Take 2 tablets (17.2 mg total) by mouth at bedtime as needed (for constipation). 07/23/15  Yes Willeen Niece, MD  traZODone (DESYREL) 50 MG tablet Take 0.5-1 tablets (25-50 mg total) by mouth at bedtime as needed for sleep. 03/08/17  Yes Tresa Garter, MD    Objective:   Vitals:   03/08/17 1535  BP: 140/79  Pulse: 96  Resp: 18  Temp: 98.1 F (36.7 C)  TempSrc: Oral  SpO2: 98%  Height: 5' (1.524 m)   Exam General appearance : Awake, alert, not in any distress. Speech Clear. Not toxic looking, obese, blind HEENT: Atraumatic and Normocephalic, pupils equally reactive to light and accomodation Neck: Supple, no JVD. No cervical lymphadenopathy.  Chest: Good air entry bilaterally, no added sounds  CVS: S1 S2 regular, no murmurs.  Abdomen: Bowel sounds present, Non  tender and not distended with no gaurding, rigidity or rebound. Extremities: B/L TMA, both legs are warm to touch Neurology: Awake alert, and oriented X 3, CN II-XII intact, Non focal  Data Review Lab Results  Component Value Date   HGBA1C 7.8 03/08/2017   HGBA1C 9.5 11/16/2016   HGBA1C 8.0 (H) 05/20/2016    Assessment & Plan   1. Diabetes mellitus type 2 with peripheral artery disease ([place])  - POCT A1CIs down to 7.8% from 9.5% in May - Glucose (CBG) Refill - gabapentin (NEURONTIN) 300 MG capsule; Take 1 capsule (300 mg total) by mouth 4 (four) times daily.  Dispense: 360 capsule; Refill: 3 - saxagliptin HCl (ONGLYZA) 2.5 MG TABS tablet; Take 1 tablet (2.5 mg total) by mouth 2 (two) times daily.  Dispense: 60 tablet; Refill: 3 - insulin glargine (LANTUS) 100 UNIT/ML injection; Inject 0.15 mLs (15 Units total) into the skin at bedtime.  Dispense: 10 mL; Refill: 3  2. PAD (peripheral artery disease) (HCC) Refill - clopidogrel (PLAVIX) 75 MG tablet; TAKE ONE (1) TABLET BY MOUTH EVERY DAY  Dispense: 30 tablet; Refill: 3 -  atorvastatin (LIPITOR) 40 MG tablet; Take 1 tablet (40 mg total) by mouth every morning.  Dispense: 90 tablet; Refill: 3  3. PTSD (post-traumatic stress disorder)  - DULoxetine (CYMBALTA) 30 MG capsule; Take 1 capsule (30 mg total) by mouth daily.  Dispense: 30 capsule; Refill: 3  4. SOB (shortness of breath)  - furosemide (LASIX) 20 MG tablet; Take 1 tablet (20 mg total) by mouth 2 (two) times daily.  Dispense: 60 tablet; Refill: 3 - potassium chloride (K-DUR) 10 MEQ tablet; Take 1 tablet (10 mEq total) by mouth daily.  Dispense: 30 tablet; Refill: 3  5. Primary insomnia Refill - traZODone (DESYREL) 50 MG tablet; Take 0.5-1 tablets (25-50 mg total) by mouth at bedtime as needed for sleep.  Dispense: 30 tablet; Refill: 3  6. Essential hypertension  We have discussed target BP range and blood pressure goal. I have advised patient to check BP regularly and to call us back or report to clinic if the numbers are consistently higher than 140/90. We discussed the importance of compliance with medical therapy and DASH diet recommended, consequences of uncontrolled hypertension discussed.  - continue current BP medications   Patient have been counseled extensively about nutrition and exercise. Other issues discussed during this visit include: low cholesterol diet, weight control and daily exercise, foot care, annual eye examinations at Ophthalmology, importance of adherence with medications and regular follow-up. We also discussed long term complications of uncontrolled diabetes and hypertension.   Return in about 4 weeks (around 04/05/2017) for Hemoglobin A1C and Follow up, DM, Follow up HTN, Follow up Pain and comorbidities.  The patient was given clear instructions to go to ER or return to medical center if symptoms don't improve, worsen or new problems develop. The patient verbalized understanding. The patient was told to call to get lab results if they haven't heard anything in the next week.    This note has been created with Surveyor, quantity. Any transcriptional errors are unintentional.    Angelica Chessman, MD, [place], [place], [place] and [place], [place]   03/08/2017, 4:41 PM

## 2017-03-15 ENCOUNTER — Other Ambulatory Visit: Payer: Self-pay | Admitting: Internal Medicine

## 2017-03-15 DIAGNOSIS — R0602 Shortness of breath: Secondary | ICD-10-CM

## 2017-03-21 ENCOUNTER — Ambulatory Visit (HOSPITAL_COMMUNITY): Payer: Medicaid Other | Attending: Internal Medicine

## 2017-04-05 ENCOUNTER — Other Ambulatory Visit: Payer: Self-pay | Admitting: Internal Medicine

## 2017-04-05 ENCOUNTER — Ambulatory Visit: Payer: Self-pay | Admitting: Internal Medicine

## 2017-04-05 DIAGNOSIS — E1165 Type 2 diabetes mellitus with hyperglycemia: Secondary | ICD-10-CM

## 2017-04-05 DIAGNOSIS — L299 Pruritus, unspecified: Secondary | ICD-10-CM

## 2017-04-26 ENCOUNTER — Ambulatory Visit: Payer: Self-pay | Admitting: Internal Medicine

## 2017-05-01 ENCOUNTER — Encounter: Payer: Self-pay | Admitting: Internal Medicine

## 2017-05-01 ENCOUNTER — Ambulatory Visit: Payer: Medicaid Other | Attending: Internal Medicine | Admitting: Internal Medicine

## 2017-05-01 VITALS — BP 131/71 | HR 76 | Temp 97.4°F | Resp 18 | Ht 64.0 in | Wt 282.0 lb

## 2017-05-01 DIAGNOSIS — E039 Hypothyroidism, unspecified: Secondary | ICD-10-CM | POA: Diagnosis not present

## 2017-05-01 DIAGNOSIS — E785 Hyperlipidemia, unspecified: Secondary | ICD-10-CM | POA: Diagnosis not present

## 2017-05-01 DIAGNOSIS — Z7902 Long term (current) use of antithrombotics/antiplatelets: Secondary | ICD-10-CM | POA: Insufficient documentation

## 2017-05-01 DIAGNOSIS — I10 Essential (primary) hypertension: Secondary | ICD-10-CM | POA: Diagnosis not present

## 2017-05-01 DIAGNOSIS — Z79899 Other long term (current) drug therapy: Secondary | ICD-10-CM | POA: Diagnosis not present

## 2017-05-01 DIAGNOSIS — E1151 Type 2 diabetes mellitus with diabetic peripheral angiopathy without gangrene: Secondary | ICD-10-CM

## 2017-05-01 DIAGNOSIS — E1122 Type 2 diabetes mellitus with diabetic chronic kidney disease: Secondary | ICD-10-CM | POA: Insufficient documentation

## 2017-05-01 DIAGNOSIS — Z7982 Long term (current) use of aspirin: Secondary | ICD-10-CM | POA: Diagnosis not present

## 2017-05-01 DIAGNOSIS — Z880 Allergy status to penicillin: Secondary | ICD-10-CM | POA: Insufficient documentation

## 2017-05-01 DIAGNOSIS — I129 Hypertensive chronic kidney disease with stage 1 through stage 4 chronic kidney disease, or unspecified chronic kidney disease: Secondary | ICD-10-CM | POA: Insufficient documentation

## 2017-05-01 DIAGNOSIS — I739 Peripheral vascular disease, unspecified: Secondary | ICD-10-CM | POA: Diagnosis not present

## 2017-05-01 DIAGNOSIS — F5101 Primary insomnia: Secondary | ICD-10-CM

## 2017-05-01 DIAGNOSIS — N189 Chronic kidney disease, unspecified: Secondary | ICD-10-CM | POA: Insufficient documentation

## 2017-05-01 DIAGNOSIS — L299 Pruritus, unspecified: Secondary | ICD-10-CM

## 2017-05-01 DIAGNOSIS — E1165 Type 2 diabetes mellitus with hyperglycemia: Secondary | ICD-10-CM | POA: Diagnosis not present

## 2017-05-01 DIAGNOSIS — R3981 Functional urinary incontinence: Secondary | ICD-10-CM | POA: Diagnosis not present

## 2017-05-01 DIAGNOSIS — R0602 Shortness of breath: Secondary | ICD-10-CM

## 2017-05-01 DIAGNOSIS — E1142 Type 2 diabetes mellitus with diabetic polyneuropathy: Secondary | ICD-10-CM | POA: Diagnosis not present

## 2017-05-01 DIAGNOSIS — N39498 Other specified urinary incontinence: Secondary | ICD-10-CM | POA: Insufficient documentation

## 2017-05-01 DIAGNOSIS — G894 Chronic pain syndrome: Secondary | ICD-10-CM

## 2017-05-01 DIAGNOSIS — E11319 Type 2 diabetes mellitus with unspecified diabetic retinopathy without macular edema: Secondary | ICD-10-CM | POA: Insufficient documentation

## 2017-05-01 DIAGNOSIS — F4310 Post-traumatic stress disorder, unspecified: Secondary | ICD-10-CM

## 2017-05-01 DIAGNOSIS — L298 Other pruritus: Secondary | ICD-10-CM | POA: Insufficient documentation

## 2017-05-01 DIAGNOSIS — Z794 Long term (current) use of insulin: Secondary | ICD-10-CM | POA: Insufficient documentation

## 2017-05-01 LAB — GLUCOSE, POCT (MANUAL RESULT ENTRY): POC GLUCOSE: 116 mg/dL — AB (ref 70–99)

## 2017-05-01 MED ORDER — GABAPENTIN 300 MG PO CAPS: 300.0000 mg | ORAL_CAPSULE | Freq: Four times a day (QID) | ORAL | 3 refills | Status: DC

## 2017-05-01 MED ORDER — CLOTRIMAZOLE 1 % EX CREA: 1.0000 "application " | TOPICAL_CREAM | Freq: Two times a day (BID) | CUTANEOUS | 3 refills | Status: DC

## 2017-05-01 MED ORDER — TRAZODONE HCL 50 MG PO TABS: 25.0000 mg | ORAL_TABLET | Freq: Every evening | ORAL | 3 refills | Status: DC | PRN

## 2017-05-01 MED ORDER — CLOPIDOGREL BISULFATE 75 MG PO TABS: ORAL_TABLET | ORAL | 3 refills | Status: DC

## 2017-05-01 MED ORDER — OXYBUTYNIN CHLORIDE 5 MG PO TABS: 5.0000 mg | ORAL_TABLET | Freq: Three times a day (TID) | ORAL | 2 refills | Status: DC

## 2017-05-01 MED ORDER — FUROSEMIDE 20 MG PO TABS: 20.0000 mg | ORAL_TABLET | Freq: Two times a day (BID) | ORAL | 3 refills | Status: DC

## 2017-05-01 MED ORDER — ATORVASTATIN CALCIUM 40 MG PO TABS: 40.0000 mg | ORAL_TABLET | ORAL | 3 refills | Status: DC

## 2017-05-01 MED ORDER — DULOXETINE HCL 40 MG PO CPEP: 40.0000 mg | ORAL_CAPSULE | Freq: Every day | ORAL | 3 refills | Status: DC

## 2017-05-01 MED ORDER — INSULIN GLARGINE 100 UNIT/ML ~~LOC~~ SOLN: 15.0000 [IU] | Freq: Every day | SUBCUTANEOUS | 3 refills | Status: DC

## 2017-05-01 MED ORDER — CARVEDILOL 12.5 MG PO TABS: 12.5000 mg | ORAL_TABLET | Freq: Two times a day (BID) | ORAL | 3 refills | Status: DC

## 2017-05-01 MED ORDER — SAXAGLIPTIN HCL 2.5 MG PO TABS: 2.5000 mg | ORAL_TABLET | Freq: Two times a day (BID) | ORAL | 3 refills | Status: DC

## 2017-05-01 MED ORDER — DIPHENHYDRAMINE HCL 25 MG PO TABS: 25.0000 mg | ORAL_TABLET | Freq: Four times a day (QID) | ORAL | 3 refills | Status: DC | PRN

## 2017-05-01 NOTE — Patient Instructions (Signed)
Diabetes Mellitus and Food It is important for you to manage your blood sugar (glucose) level. Your blood glucose level can be greatly affected by what you eat. Eating healthier foods in the appropriate amounts throughout the day at about the same time each day will help you control your blood glucose level. It can also help slow or prevent worsening of your diabetes mellitus. Healthy eating may even help you improve the level of your blood pressure and reach or maintain a healthy weight. General recommendations for healthful eating and cooking habits include:  Eating meals and snacks regularly. Avoid going long periods of time without eating to lose weight.  Eating a diet that consists mainly of plant-based foods, such as fruits, vegetables, nuts, legumes, and whole grains.  Using low-heat cooking methods, such as baking, instead of high-heat cooking methods, such as deep frying.  Work with your dietitian to make sure you understand how to use the Nutrition Facts information on food labels. How can food affect me? Carbohydrates Carbohydrates affect your blood glucose level more than any other type of food. Your dietitian will help you determine how many carbohydrates to eat at each meal and teach you how to count carbohydrates. Counting carbohydrates is important to keep your blood glucose at a healthy level, especially if you are using insulin or taking certain medicines for diabetes mellitus. Alcohol Alcohol can cause sudden decreases in blood glucose (hypoglycemia), especially if you use insulin or take certain medicines for diabetes mellitus. Hypoglycemia can be a life-threatening condition. Symptoms of hypoglycemia (sleepiness, dizziness, and disorientation) are similar to symptoms of having too much alcohol. If your health care provider has given you approval to drink alcohol, do so in moderation and use the following guidelines:  Women should not have more than one drink per day, and men  should not have more than two drinks per day. One drink is equal to: ? 12 oz of beer. ? 5 oz of wine. ? 1 oz of hard liquor.  Do not drink on an empty stomach.  Keep yourself hydrated. Have water, diet soda, or unsweetened iced tea.  Regular soda, juice, and other mixers might contain a lot of carbohydrates and should be counted.  What foods are not recommended? As you make food choices, it is important to remember that all foods are not the same. Some foods have fewer nutrients per serving than other foods, even though they might have the same number of calories or carbohydrates. It is difficult to get your body what it needs when you eat foods with fewer nutrients. Examples of foods that you should avoid that are high in calories and carbohydrates but low in nutrients include:  Trans fats (most processed foods list trans fats on the Nutrition Facts label).  Regular soda.  Juice.  Candy.  Sweets, such as cake, pie, doughnuts, and cookies.  Fried foods.  What foods can I eat? Eat nutrient-rich foods, which will nourish your body and keep you healthy. The food you should eat also will depend on several factors, including:  The calories you need.  The medicines you take.  Your weight.  Your blood glucose level.  Your blood pressure level.  Your cholesterol level.  You should eat a variety of foods, including:  Protein. ? Lean cuts of meat. ? Proteins low in saturated fats, such as fish, egg whites, and beans. Avoid processed meats.  Fruits and vegetables. ? Fruits and vegetables that may help control blood glucose levels, such as apples,   mangoes, and yams.  Dairy products. ? Choose fat-free or low-fat dairy products, such as milk, yogurt, and cheese.  Grains, bread, pasta, and rice. ? Choose whole grain products, such as multigrain bread, whole oats, and brown rice. These foods may help control blood pressure.  Fats. ? Foods containing healthful fats, such as  nuts, avocado, olive oil, canola oil, and fish.  Does everyone with diabetes mellitus have the same meal plan? Because every person with diabetes mellitus is different, there is not one meal plan that works for everyone. It is very important that you meet with a dietitian who will help you create a meal plan that is just right for you. This information is not intended to replace advice given to you by your health care provider. Make sure you discuss any questions you have with your health care provider. Document Released: 03/24/2005 Document Revised: 12/03/2015 Document Reviewed: 05/24/2013 Elsevier Interactive Patient Education  2017 [employer] Inc.  

## 2017-05-01 NOTE — Progress Notes (Signed)
Debbie [name], is a 56 y.o. female  ZTI:[id number]  ASN:[id number]  DOB - 1960-09-20  Chief Complaint  Patient presents with  . Diabetes      Subjective:   Debbie [name] is a 56 y.o. 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 Interpreter was used to communicate directly with patient for the entire encounter including providing detailed patient instructions. She has chronic multiple complaints but handicapped to care for herself. She continues to have chronic pain, blindness, she still has abdominal wall edema but improving. She has no fever, denies SOB. She still has itching and irritation in her skin, mostly her trunk and upper limbs. No yellowness of her eyes. No hx of liver disease, does not drink alcohol. Per her sister, patient continues to have significant urinary incontinence and patient is getting more frustrated by the day due to her many co morbidities and chronic medical conditions. Patient has No headache, No chest pain, No abdominal pain - No Nausea, No new weakness tingling or numbness.  No problems updated.  ALLERGIES: Allergies  Allergen Reactions  . Penicillins Rash    Has patient had a PCN reaction causing immediate rash, facial/tongue/throat swelling, SOB or lightheadedness with hypotension: Yes Has patient had a PCN reaction causing severe rash involving mucus membranes or skin necrosis: No Has patient had a PCN reaction that required hospitalization No Has patient had a PCN reaction occurring within the last 10 years: No If all of the above answers are "NO", then may proceed with Cephalosporin use.   Marland Kitchen Hydrocodone Rash  . Hydrocortisone Rash    Redness  . Latex Itching and Other (See Comments)    Redness  . Tylenol With Codeine #3 [Acetaminophen-Codeine] Swelling and Rash      SWELLING REACTION UNSPECIFIED     PAST MEDICAL HISTORY: Past Medical History:  Diagnosis Date  . Anemia   . Blind   . Cataract   . Chest pain    a. ? MI in 2010, [place]->medically managed  . Chronic kidney disease    acute renal disease - 02/2016- "from bleeding so much"  . Depression   . Diabetic foot ulcer ([place])   . History of blood transfusion    02/2016 abnormal bleeding vagina  . Hypertension    x ~ 10 years  . Hypothyroidism    no medication  . Neuropathy   . Obesity   . Shortness of breath dyspnea    with movement  . Type II or unspecified type diabetes mellitus with unspecified complication, uncontrolled    x ~ 10 years    MEDICATIONS AT HOME: Prior to Admission medications   Medication Sig Start Date End Date Taking? Authorizing Provider  ACCU-CHEK AVIVA PLUS test strip CHECK BLOOD SUGAR THREE TIMES DAILY 02/06/17  Yes Tresa Garter, MD  aspirin 81 MG EC tablet Take 1 tablet (81 mg total) by mouth daily. 11/16/16  Yes Tresa Garter, MD  atorvastatin (LIPITOR) 40 MG tablet Take 1 tablet (40 mg total) by mouth every morning. 05/01/17  Yes Tresa Garter, MD  B-D INS SYR ULTRAFINE 1CC/31G 31G X 5/16" 1 ML MISC USE AS DIRECTED WITH LANTUS AND NOVOLOG INJECTIONS 10/31/16  Yes Tresa Garter, MD  carvedilol (COREG) 12.5 MG tablet Take 1 tablet (12.5 mg total) by mouth 2 (two) times daily with a meal. 05/01/17  Yes Tresa Garter, MD  clopidogrel (PLAVIX) 75 MG tablet TAKE ONE (1) TABLET BY MOUTH EVERY DAY 05/01/17  Yes Oviya Ammar E, MD  clotrimazole (LOTRIMIN) 1 % cream Apply 1 application topically 2 (two) times daily. 05/01/17  Yes Tresa Garter, MD  diphenhydrAMINE (BENADRYL) 25 MG tablet Take 1 tablet (25 mg total) by mouth every 6 (six) hours as needed (for allergies.). 05/01/17  Yes Korde Jeppsen E, MD  DULoxetine HCl 40 MG CPEP Take 40 mg by mouth daily. 05/01/17  Yes Tresa Garter, MD  furosemide (LASIX) 20  MG tablet Take 1 tablet (20 mg total) by mouth 2 (two) times daily. 05/01/17  Yes Tresa Garter, MD  gabapentin (NEURONTIN) 300 MG capsule Take 1 capsule (300 mg total) by mouth 4 (four) times daily. 05/01/17  Yes Lilana Blasko E, MD  insulin glargine (LANTUS) 100 UNIT/ML injection Inject 0.15 mLs (15 Units total) into the skin at bedtime. 05/01/17  Yes Tresa Garter, MD  Lancets (ACCU-CHEK SOFT TOUCH) lancets Use as instructed Patient taking differently: 1 each by Other route See admin instructions. Check blood sugar once daily 12/31/15  Yes Jeraline Marcinek E, MD  loteprednol (LOTEMAX) 0.5 % ophthalmic suspension Place 1 drop into both eyes 2 (two) times daily. 01/09/14  Yes Tresa Garter, MD  megestrol (MEGACE) 40 MG tablet Take 1 tablet (40 mg total) by mouth 2 (two) times daily. Reduce to once daily one week after bleeding stops Patient taking differently: Take 40 mg by mouth daily.  03/02/16  Yes Pollina, Gwenyth Allegra, MD  nitroGLYCERIN (NITRODUR - DOSED IN MG/24 HR) 0.2 mg/hr patch Place 1 patch (0.2 mg total) onto the skin daily. Apply patch to the top of the ankle. Change patch daily change location daily 05/20/16  Yes Newt Minion, MD  NOVOLOG 100 UNIT/ML injection INJECT 3 TIMES DAILY FOR BLOOD SUGAR 70-120=0 UNITS, 121-150=2 U, 151-200=3 U201-250=5 U, 251-300=8 U, 301-350=11 U, OVER 350= OFFICE VISIT 04/05/17  Yes Baptiste Littler E, MD  oxybutynin (DITROPAN) 5 MG tablet Take 1 tablet (5 mg total) by mouth 3 (three) times daily. 05/01/17  Yes Tresa Garter, MD  potassium chloride (K-DUR) 10 MEQ tablet Take 1 tablet (10 mEq total) by mouth daily. 03/08/17  Yes Tresa Garter, MD  saxagliptin HCl (ONGLYZA) 2.5 MG TABS tablet Take 1 tablet (2.5 mg total) by mouth 2 (two) times daily. 05/01/17  Yes Tresa Garter, MD  senna (SENOKOT) 8.6 MG TABS tablet Take 2 tablets (17.2 mg total) by mouth at bedtime as needed (for constipation). 07/23/15  Yes  Willeen Niece, MD  traZODone (DESYREL) 50 MG tablet Take 0.5-1 tablets (25-50 mg total) by mouth at bedtime as needed for sleep. 05/01/17  Yes Esterlene Atiyeh E, MD  triamcinolone cream (KENALOG) 0.1 % APPLY TO THE AFFECTED AREA ONCE DAILY ASNEEDED FOR ITCHING 04/05/17  Yes Tresa Garter, MD    Objective:   Vitals:   05/01/17 1505  BP: 131/71  Pulse: 76  Resp: 18  Temp: (!) 97.4 F (36.3 C)  TempSrc: Oral  SpO2: 95%  Weight: 282 lb (127.9 kg)  Height: 5\' 4"  (1.626 m)   Exam General appearance : Awake, alert, not in any distress. Speech Clear. Not toxic looking HEENT: Atraumatic and Normocephalic, pupils equally reactive to light and accomodation Neck: Supple, no JVD. No cervical lymphadenopathy.  Chest: Good air entry bilaterally, no added sounds  CVS: S1 S2 regular, no murmurs.  Abdomen: Bowel  sounds present, Non tender and not distended with no gaurding, rigidity or rebound. Extremities: B/L Lower Ext shows no edema, both legs are warm to touch Neurology: Awake alert, and oriented X 3, CN II-XII intact, Non focal Skin: No Rash  Data Review Lab Results  Component Value Date   HGBA1C 7.8 03/08/2017   HGBA1C 9.5 11/16/2016   HGBA1C 8.0 (H) 05/20/2016    Assessment & Plan   1. Type 2 diabetes mellitus with hyperglycemia, unspecified whether long term insulin use (HCC)  - Glucose (CBG)  2. PAD (peripheral artery disease) (HCC)  - atorvastatin (LIPITOR) 40 MG tablet; Take 1 tablet (40 mg total) by mouth every morning.  Dispense: 90 tablet; Refill: 3 - clopidogrel (PLAVIX) 75 MG tablet; TAKE ONE (1) TABLET BY MOUTH EVERY DAY  Dispense: 30 tablet; Refill: 3  3. Chronic pain syndrome - Continue current medications  4. Essential hypertension We have discussed target BP range and blood pressure goal. I have advised patient to check BP regularly and to call us back or report to clinic if the numbers are consistently higher than 140/90. We discussed the importance  of compliance with medical therapy and DASH diet recommended, consequences of uncontrolled hypertension discussed.  - continue current BP medications  5. Primary insomnia  - traZODone (DESYREL) 50 MG tablet; Take 0.5-1 tablets (25-50 mg total) by mouth at bedtime as needed for sleep.  Dispense: 30 tablet; Refill: 3  6. Diabetes mellitus type 2 with peripheral artery disease (HCC)  - saxagliptin HCl (ONGLYZA) 2.5 MG TABS tablet; Take 1 tablet (2.5 mg total) by mouth 2 (two) times daily.  Dispense: 60 tablet; Refill: 3 - gabapentin (NEURONTIN) 300 MG capsule; Take 1 capsule (300 mg total) by mouth 4 (four) times daily.  Dispense: 360 capsule; Refill: 3 - insulin glargine (LANTUS) 100 UNIT/ML injection; Inject 0.15 mLs (15 Units total) into the skin at bedtime.  Dispense: 10 mL; Refill: 3  7. Urinary incontinence due to immobility  - oxybutynin (DITROPAN) 5 MG tablet; Take 1 tablet (5 mg total) by mouth 3 (three) times daily.  Dispense: 90 tablet; Refill: 2  8. Itching with irritation  - clotrimazole (LOTRIMIN) 1 % cream; Apply 1 application topically 2 (two) times daily.  Dispense: 60 g; Refill: 3 - diphenhydrAMINE (BENADRYL) 25 MG tablet; Take 1 tablet (25 mg total) by mouth every 6 (six) hours as needed (for allergies.).  Dispense: 60 tablet; Refill: 3  9. PTSD (post-traumatic stress disorder)  - DULoxetine HCl 40 MG CPEP; Take 40 mg by mouth daily.  Dispense: 30 capsule; Refill: 3  10. SOB (shortness of breath)  - furosemide (LASIX) 20 MG tablet; Take 1 tablet (20 mg total) by mouth 2 (two) times daily.  Dispense: 60 tablet; Refill: 3  Patient have been counseled extensively about nutrition and exercise. Other issues discussed during this visit include: low cholesterol diet, weight control and daily exercise, foot care, annual eye examinations at Ophthalmology, importance of adherence with medications and regular follow-up. We also discussed long term complications of uncontrolled  diabetes and hypertension.   Return in about 3 months (around 08/01/2017) for Hemoglobin A1C and Follow up, DM, Follow up HTN.  Interpreter was used to communicate directly with patient for the entire encounter including providing detailed patient instructions.   The patient was given clear instructions to go to ER or return to medical center if symptoms don't improve, worsen or new problems develop. The patient verbalized understanding. The patient was told to call to  get lab results if they haven't heard anything in the next week.   This note has been created with Surveyor, quantity. Any transcriptional errors are unintentional.    Angelica Chessman, MD, [place], [place], [place], [place] and [place], [place]   05/01/2017, 4:20 PM

## 2017-05-03 ENCOUNTER — Ambulatory Visit: Payer: Self-pay | Admitting: Internal Medicine

## 2017-05-29 ENCOUNTER — Telehealth: Payer: Self-pay | Admitting: *Deleted

## 2017-05-29 NOTE — Telephone Encounter (Signed)
Pt sister came in office to pick up medication from pharmacy. She informs staff that her sister has been bleeding x 2 weeks. Advised sister that patient needed to be seen in the ED due to history and need for previous transfusion.

## 2017-06-23 ENCOUNTER — Telehealth: Payer: Self-pay

## 2017-06-23 NOTE — Telephone Encounter (Signed)
Met with the patient's sister, Shawnese Magner, when she came to the office today. She was requesting assistance with completing a [employer] application. Part A of the application has been submitted already by the patient's sister  Part B was completed and faxed to [employer] - attention Raliegh Scarlet.  The option of PCS and CAP services were discussed but Iman denied the need for those services at this time.  She said that she and her family are able to provide the needed care for the patient.

## 2017-06-27 ENCOUNTER — Telehealth: Payer: Self-pay | Admitting: Internal Medicine

## 2017-06-27 NOTE — Telephone Encounter (Signed)
Call placed to Raliegh Scarlet from [facility] [phone] to confirm if patient's application was received. No answer. Left Shirlee Limerick a message asking her to return my call at [phone]. She can ask for me or Opal Sidles (Case Fre[employer] forwarder).

## 2017-07-05 ENCOUNTER — Telehealth: Payer: Self-pay | Admitting: Internal Medicine

## 2017-07-05 NOTE — Telephone Encounter (Signed)
Asking if patient was on a medication

## 2017-07-05 NOTE — Telephone Encounter (Signed)
Call placed to patient's sister Iman regarding patient's [employer] application. Wanted to follow up with her and check if [employer] had reached out to them and set up an assessment or had approved patient for services. No answer. Left message asking for a call back at [phone].

## 2017-07-05 NOTE — Telephone Encounter (Signed)
Pt. Returned call regarding [employer] application. Please f/u

## 2017-07-06 ENCOUNTER — Telehealth: Payer: Self-pay | Admitting: Internal Medicine

## 2017-07-06 NOTE — Telephone Encounter (Signed)
Call placed to patient sister, Judieth Keens, after she returned my call. Spoke with Iman and she informed me that [employer] has not reached out to them regarding their application. Informed Iman that I will try to contact them again but also informed her that things may be delayed due to the holidays.

## 2017-07-06 NOTE — Telephone Encounter (Signed)
Call placed to [employer] [phone] regarding patient's application. Informed the receptionist that I have tried to contact Debbie [name] but have had no luck and if I can speak with someone different.  Receptionist just transferred me to [name]'s extension. No answer. Left message asking for a call back on [phone].

## 2017-07-07 ENCOUNTER — Telehealth: Payer: Self-pay | Admitting: Internal Medicine

## 2017-07-07 ENCOUNTER — Encounter: Payer: Self-pay | Admitting: Pharmacist

## 2017-07-07 NOTE — Progress Notes (Signed)
PA submitted and approved for [facility]. Approval #[id number]

## 2017-07-07 NOTE — Telephone Encounter (Signed)
Returned Shirlee Limerick Robinson's call from yesterday regarding patient's [employer] application. I was not in the office at that time. Called the number she asked to call back at [phone]. No one answered and call dropped after several rings. Called the main line #[phone] and spoke with Anderson Malta, she transferred me to [name]'s extension. No answer. Left a message letting her know that I was returning her call and that I would be in the office today until 5:30pm. Also left my call back number [phone].

## 2017-07-14 ENCOUNTER — Telehealth: Payer: Self-pay | Admitting: Internal Medicine

## 2017-07-14 NOTE — Telephone Encounter (Signed)
Call placed to Debbie [name] from [facility] [phone] to check on the status of patient's application. Shirlee Limerick informed me that Part A of patient's application was received but not part B. Informed her that we had sent it back on 12/14. She asked that we resend it again.  Faxed Part B of application to [employer] again [phone].

## 2017-07-21 ENCOUNTER — Telehealth: Payer: Self-pay | Admitting: Internal Medicine

## 2017-07-21 NOTE — Telephone Encounter (Signed)
Call placed to Debbie [name] from [facility] [phone] to check on the status of patient's application and if it was received. No answer. Left Shirlee Limerick a message asking her to return my call at [phone].

## 2017-07-31 ENCOUNTER — Other Ambulatory Visit: Payer: Self-pay | Admitting: Pharmacist

## 2017-07-31 DIAGNOSIS — E1165 Type 2 diabetes mellitus with hyperglycemia: Secondary | ICD-10-CM

## 2017-07-31 MED ORDER — GLUCOSE BLOOD VI STRP: ORAL_STRIP | 5 refills | Status: DC

## 2017-08-02 ENCOUNTER — Ambulatory Visit: Payer: Self-pay | Admitting: Internal Medicine

## 2017-08-09 ENCOUNTER — Telehealth: Payer: Self-pay | Admitting: Internal Medicine

## 2017-08-09 NOTE — Telephone Encounter (Signed)
Call placed to patient's sister Danisa Kopec [phone] regarding patient's SCAT application and to check if patient got approved. No answer. Left Iman a message asking her to return my call at [phone]. She can also speak with Opal Sidles.

## 2017-08-23 ENCOUNTER — Ambulatory Visit: Payer: Medicaid Other | Attending: Internal Medicine | Admitting: Internal Medicine

## 2017-08-23 ENCOUNTER — Telehealth: Payer: Self-pay | Admitting: Internal Medicine

## 2017-08-23 ENCOUNTER — Other Ambulatory Visit: Payer: Self-pay

## 2017-08-23 ENCOUNTER — Encounter: Payer: Self-pay | Admitting: Internal Medicine

## 2017-08-23 VITALS — BP 134/76 | HR 80 | Temp 98.5°F | Resp 12

## 2017-08-23 DIAGNOSIS — F419 Anxiety disorder, unspecified: Secondary | ICD-10-CM | POA: Diagnosis not present

## 2017-08-23 DIAGNOSIS — Z7902 Long term (current) use of antithrombotics/antiplatelets: Secondary | ICD-10-CM | POA: Diagnosis not present

## 2017-08-23 DIAGNOSIS — Z794 Long term (current) use of insulin: Secondary | ICD-10-CM | POA: Insufficient documentation

## 2017-08-23 DIAGNOSIS — Z79899 Other long term (current) drug therapy: Secondary | ICD-10-CM | POA: Insufficient documentation

## 2017-08-23 DIAGNOSIS — R3981 Functional urinary incontinence: Secondary | ICD-10-CM | POA: Insufficient documentation

## 2017-08-23 DIAGNOSIS — Z89432 Acquired absence of left foot: Secondary | ICD-10-CM | POA: Insufficient documentation

## 2017-08-23 DIAGNOSIS — F329 Major depressive disorder, single episode, unspecified: Secondary | ICD-10-CM | POA: Insufficient documentation

## 2017-08-23 DIAGNOSIS — I739 Peripheral vascular disease, unspecified: Secondary | ICD-10-CM | POA: Diagnosis not present

## 2017-08-23 DIAGNOSIS — E669 Obesity, unspecified: Secondary | ICD-10-CM | POA: Insufficient documentation

## 2017-08-23 DIAGNOSIS — E11319 Type 2 diabetes mellitus with unspecified diabetic retinopathy without macular edema: Secondary | ICD-10-CM | POA: Insufficient documentation

## 2017-08-23 DIAGNOSIS — Z7982 Long term (current) use of aspirin: Secondary | ICD-10-CM | POA: Diagnosis not present

## 2017-08-23 DIAGNOSIS — F4310 Post-traumatic stress disorder, unspecified: Secondary | ICD-10-CM | POA: Diagnosis not present

## 2017-08-23 DIAGNOSIS — E1165 Type 2 diabetes mellitus with hyperglycemia: Secondary | ICD-10-CM | POA: Diagnosis not present

## 2017-08-23 DIAGNOSIS — I10 Essential (primary) hypertension: Secondary | ICD-10-CM | POA: Insufficient documentation

## 2017-08-23 DIAGNOSIS — E1140 Type 2 diabetes mellitus with diabetic neuropathy, unspecified: Secondary | ICD-10-CM | POA: Diagnosis not present

## 2017-08-23 DIAGNOSIS — Z09 Encounter for follow-up examination after completed treatment for conditions other than malignant neoplasm: Secondary | ICD-10-CM | POA: Insufficient documentation

## 2017-08-23 DIAGNOSIS — E1151 Type 2 diabetes mellitus with diabetic peripheral angiopathy without gangrene: Secondary | ICD-10-CM | POA: Diagnosis not present

## 2017-08-23 DIAGNOSIS — E785 Hyperlipidemia, unspecified: Secondary | ICD-10-CM | POA: Diagnosis not present

## 2017-08-23 LAB — GLUCOSE, POCT (MANUAL RESULT ENTRY): POC GLUCOSE: 211 mg/dL — AB (ref 70–99)

## 2017-08-23 MED ORDER — DULOXETINE HCL 40 MG PO CPEP: 40.0000 mg | ORAL_CAPSULE | Freq: Every day | ORAL | 3 refills | Status: DC

## 2017-08-23 MED ORDER — SAXAGLIPTIN HCL 2.5 MG PO TABS: 2.5000 mg | ORAL_TABLET | Freq: Two times a day (BID) | ORAL | 3 refills | Status: DC

## 2017-08-23 MED ORDER — CARVEDILOL 12.5 MG PO TABS: 12.5000 mg | ORAL_TABLET | Freq: Two times a day (BID) | ORAL | 3 refills | Status: DC

## 2017-08-23 MED ORDER — INSULIN ASPART 100 UNIT/ML ~~LOC~~ SOLN: SUBCUTANEOUS | 2 refills | Status: DC

## 2017-08-23 MED ORDER — OXYBUTYNIN CHLORIDE 5 MG PO TABS: 5.0000 mg | ORAL_TABLET | Freq: Three times a day (TID) | ORAL | 2 refills | Status: DC

## 2017-08-23 MED ORDER — CLOPIDOGREL BISULFATE 75 MG PO TABS: ORAL_TABLET | ORAL | 3 refills | Status: DC

## 2017-08-23 MED ORDER — GABAPENTIN 300 MG PO CAPS: 300.0000 mg | ORAL_CAPSULE | Freq: Four times a day (QID) | ORAL | 3 refills | Status: DC

## 2017-08-23 MED ORDER — LISINOPRIL 5 MG PO TABS: 5.0000 mg | ORAL_TABLET | Freq: Two times a day (BID) | ORAL | 3 refills | Status: DC

## 2017-08-23 MED ORDER — ATORVASTATIN CALCIUM 40 MG PO TABS: 40.0000 mg | ORAL_TABLET | ORAL | 3 refills | Status: DC

## 2017-08-23 MED ORDER — FUROSEMIDE 20 MG PO TABS: 20.0000 mg | ORAL_TABLET | Freq: Two times a day (BID) | ORAL | 3 refills | Status: DC

## 2017-08-23 NOTE — Progress Notes (Signed)
Debbie [name], is a 57 y.o. female  EPP:[id number]  SAY:[id number]  DOB - 1960-07-31  Chief Complaint  Patient presents with  . Follow-up      Subjective:   Debbie [name] is a 57 y.o. 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 Patient was admitted to the hospital for 4 days recently where she was transfused with 2 units of blood for anemia. She continues to bleed Per Vaginam. She also had diarrhea for 3 days. She has follow up appointment with OBGYN for Uterine Fibroid with associated DUB. She was also treated for UTI. Patient also need medication refills today. Patient has No headache, No chest pain, No abdominal pain - No Nausea, No new weakness tingling or numbness, No Cough - SOB. [employer] video interpreter was used to communicate directly with patient for the entire encounter including providing detailed patient instructions.  ALLERGIES: Allergies  Allergen Reactions  . Penicillins Rash    Has patient had a PCN reaction causing immediate rash, facial/tongue/throat swelling, SOB or lightheadedness with hypotension: Yes Has patient had a PCN reaction causing severe rash involving mucus membranes or skin necrosis: No Has patient had a PCN reaction that required hospitalization No Has patient had a PCN reaction occurring within the last 10 years: No If all of the above answers are "NO", then may proceed with Cephalosporin use.   Marland Kitchen Hydrocodone Rash  . Hydrocortisone Rash    Redness  . Latex Itching and Other (See Comments)    Redness  . Tylenol With Codeine #3 [Acetaminophen-Codeine] Swelling and Rash    SWELLING REACTION UNSPECIFIED     PAST MEDICAL HISTORY: Past Medical History:  Diagnosis Date  . Anemia   . Blind   . Cataract   . Chest pain    a. ? MI in 2010,  [place]->medically managed  . Chronic kidney disease    acute renal disease - 02/2016- "from bleeding so much"  . Depression   . Diabetic foot ulcer ([place])   . History of blood transfusion    02/2016 abnormal bleeding vagina  . Hypertension    x ~ 10 years  . Hypothyroidism    no medication  . Neuropathy   . Obesity   . Shortness of breath dyspnea    with movement  . Type II or unspecified type diabetes mellitus with unspecified complication, uncontrolled    x ~ 10 years    MEDICATIONS AT HOME: Prior to Admission medications   Medication Sig Start Date End Date Taking? Authorizing Provider  aspirin 81 MG EC tablet Take 1 tablet (81 mg total) by mouth daily. 11/16/16  Yes Tresa Garter, MD  atorvastatin (LIPITOR) 40 MG tablet Take 1 tablet (40 mg total) by mouth every morning. 08/23/17  Yes Tresa Garter, MD  carvedilol (COREG) 12.5 MG tablet Take 1 tablet (12.5 mg total) by mouth 2 (two) times daily with a meal. 08/23/17  Yes Modest Draeger E, MD  clopidogrel (PLAVIX) 75 MG tablet TAKE ONE (1) TABLET BY MOUTH EVERY DAY 08/23/17  Yes Shepard Keltz E, MD  DULoxetine HCl 40 MG CPEP Take 40 mg by mouth daily. 08/23/17  Yes Tresa Garter, MD  furosemide (LASIX) 20 MG tablet Take 1 tablet (20 mg total) by mouth 2 (two) times daily. 08/23/17  Yes Tresa Garter, MD  gabapentin (NEURONTIN)  300 MG capsule Take 1 capsule (300 mg total) by mouth 4 (four) times daily. 08/23/17  Yes Curties Conigliaro E, MD  insulin aspart (NOVOLOG) 100 UNIT/ML injection INJECT 3 TIMES DAILY FOR BLOOD SUGAR 70-120=0 UNITS, 121-150=2 U, 151-200=3 U201-250=5 U, 251-300=8 U, 301-350=11 U, OVER 350= OFFICE VISIT 08/23/17  Yes Nyiesha Beever E, MD  insulin glargine (LANTUS) 100 UNIT/ML injection Inject 0.15 mLs (15 Units total) into the skin at bedtime. 05/01/17  Yes Tresa Garter, MD  lisinopril (PRINIVIL,ZESTRIL) 5 MG tablet Take 1 tablet (5 mg total) by mouth 2 (two) times daily.  08/23/17  Yes Tresa Garter, MD  oxybutynin (DITROPAN) 5 MG tablet Take 1 tablet (5 mg total) by mouth 3 (three) times daily. 08/23/17  Yes Tresa Garter, MD  saxagliptin HCl (ONGLYZA) 2.5 MG TABS tablet Take 1 tablet (2.5 mg total) by mouth 2 (two) times daily. 08/23/17  Yes Tresa Garter, MD  B-D INS SYR ULTRAFINE 1CC/31G 31G X 5/16" 1 ML MISC USE AS DIRECTED WITH LANTUS AND NOVOLOG INJECTIONS 10/31/16   Tresa Garter, MD  clotrimazole (LOTRIMIN) 1 % cream Apply 1 application topically 2 (two) times daily. Patient not taking: Reported on 08/23/2017 05/01/17   Tresa Garter, MD  glucose blood (ACCU-CHEK AVIVA PLUS) test strip CHECK BLOOD SUGAR THREE TIMES DAILY 07/31/17   Tresa Garter, MD  Lancets (ACCU-CHEK SOFT TOUCH) lancets Use as instructed Patient taking differently: 1 each by Other route See admin instructions. Check blood sugar once daily 12/31/15   Angelica Chessman E, MD  loteprednol (LOTEMAX) 0.5 % ophthalmic suspension Place 1 drop into both eyes 2 (two) times daily. Patient not taking: Reported on 08/23/2017 01/09/14   Tresa Garter, MD  nitroGLYCERIN (NITRODUR - DOSED IN MG/24 HR) 0.2 mg/hr patch Place 1 patch (0.2 mg total) onto the skin daily. Apply patch to the top of the ankle. Change patch daily change location daily Patient not taking: Reported on 08/23/2017 05/20/16   Newt Minion, MD  potassium chloride (K-DUR) 10 MEQ tablet Take 1 tablet (10 mEq total) by mouth daily. Patient not taking: Reported on 08/23/2017 03/08/17   Tresa Garter, MD    Objective:   Vitals:   08/23/17 1421  BP: 134/76  Pulse: 80  Resp: 12  Temp: 98.5 F (36.9 C)  TempSrc: Oral  SpO2: 96%   Exam General appearance : Awake, alert, not in any distress. Speech Clear. Not toxic looking. Chronically ill-looking. HEENT: Atraumatic and Normocephalic, pupils equally reactive to light and accomodation. Legally blind Neck: Supple, no JVD. No cervical  lymphadenopathy.  Chest: Good air entry bilaterally, no added sounds  CVS: S1 S2 regular, no murmurs.  Abdomen: Bowel sounds present, Non tender and not distended with no gaurding, rigidity or rebound. Extremities: Left transmetatarsal amputation. B/L Lower Ext shows no edema, both legs are warm to touch Neurology: Awake alert, and oriented X 3, CN II-XII intact, Non focal Skin: No Rash  Data Review Lab Results  Component Value Date   HGBA1C 7.8 03/08/2017   HGBA1C 9.5 11/16/2016   HGBA1C 8.0 (H) 05/20/2016    Assessment & Plan   1. Type 2 diabetes mellitus with hyperglycemia, unspecified whether long term insulin use (HCC)  - Glucose (CBG) - CBC with Differential/Platelet - CMP14+EGFR - Urinalysis, Complete  - saxagliptin HCl (ONGLYZA) 2.5 MG TABS tablet; Take 1 tablet (2.5 mg total) by mouth 2 (two) times daily.  Dispense: 60 tablet; Refill: 3 - insulin aspart (  NOVOLOG) 100 UNIT/ML injection; INJECT 3 TIMES DAILY FOR BLOOD SUGAR 70-120=0 UNITS, 121-150=2 U, 151-200=3 U201-250=5 U, 251-300=8 U, 301-350=11 U, OVER 350= OFFICE VISIT  Dispense: 20 mL; Refill: 2  2. PAD (peripheral artery disease) (HCC)  - Lipid panel - atorvastatin (LIPITOR) 40 MG tablet; Take 1 tablet (40 mg total) by mouth every morning.  Dispense: 90 tablet; Refill: 3 - clopidogrel (PLAVIX) 75 MG tablet; TAKE ONE (1) TABLET BY MOUTH EVERY DAY  Dispense: 30 tablet; Refill: 3  3. Essential hypertension  - carvedilol (COREG) 12.5 MG tablet; Take 1 tablet (12.5 mg total) by mouth 2 (two) times daily with a meal.  Dispense: 60 tablet; Refill: 3 - furosemide (LASIX) 20 MG tablet; Take 1 tablet (20 mg total) by mouth 2 (two) times daily.  Dispense: 60 tablet; Refill: 3 - lisinopril (PRINIVIL,ZESTRIL) 5 MG tablet; Take 1 tablet (5 mg total) by mouth 2 (two) times daily.  Dispense: 90 tablet; Refill: 3  4. S/P transmetatarsal amputation of foot, left (HCC) Stable  5. Diabetes mellitus type 2 with peripheral artery  disease (HCC)  - gabapentin (NEURONTIN) 300 MG capsule; Take 1 capsule (300 mg total) by mouth 4 (four) times daily.  Dispense: 360 capsule; Refill: 3  6. PTSD (post-traumatic stress disorder)  - DULoxetine HCl 40 MG CPEP; Take 40 mg by mouth daily.  Dispense: 30 capsule; Refill: 3  7. Urinary incontinence due to immobility  - oxybutynin (DITROPAN) 5 MG tablet; Take 1 tablet (5 mg total) by mouth 3 (three) times daily.  Dispense: 90 tablet; Refill: 2  Patient have been counseled extensively about nutrition and exercise. Other issues discussed during this visit include: low cholesterol diet, weight control and daily exercise, foot care, annual eye examinations at Ophthalmology, importance of adherence with medications and regular follow-up. We also discussed long term complications of uncontrolled diabetes and hypertension.   Return in about 3 months (around 11/20/2017) for Hemoglobin A1C and Follow up, DM, Follow up HTN, Follow up Pain and comorbidities.  The patient was given clear instructions to go to ER or return to medical center if symptoms don't improve, worsen or new problems develop. The patient verbalized understanding. The patient was told to call to get lab results if they haven't heard anything in the next week.   This note has been created with Surveyor, quantity. Any transcriptional errors are unintentional.    Angelica Chessman, MD, MHA, Karilyn Cota, [place] and [place], [place]   08/23/2017, 3:27 PM

## 2017-08-23 NOTE — Patient Instructions (Signed)
Peripheral Vascular Disease Peripheral vascular disease (PVD) is a disease of the blood vessels that are not part of your heart and brain. A simple term for PVD is poor circulation. In most cases, PVD narrows the blood vessels that carry blood from your heart to the rest of your body. This can result in a decreased supply of blood to your arms, legs, and internal organs, like your stomach or kidneys. However, it most often affects a person's lower legs and feet. There are two types of PVD.  Organic PVD. This is the more common type. It is caused by damage to the structure of blood vessels.  Functional PVD. This is caused by conditions that make blood vessels contract and tighten (spasm).  Without treatment, PVD tends to get worse over time. PVD can also lead to acute ischemic limb. This is when an arm or limb suddenly has trouble getting enough blood. This is a medical emergency. Follow these instructions at home:  Take medicines only as told by your doctor.  Do not use any tobacco products, including cigarettes, chewing tobacco, or electronic cigarettes. If you need help quitting, ask your doctor.  Lose weight if you are overweight, and maintain a healthy weight as told by your doctor.  Eat a diet that is low in fat and cholesterol. If you need help, ask your doctor.  Exercise regularly. Ask your doctor for some good activities for you.  Take good care of your feet. ? Wear comfortable shoes that fit well. ? Check your feet often for any cuts or sores. Contact a doctor if:  You have cramps in your legs while walking.  You have leg pain when you are at rest.  You have coldness in a leg or foot.  Your skin changes.  You are unable to get or have an erection (erectile dysfunction).  You have cuts or sores on your feet that are not healing. Get help right away if:  Your arm or leg turns cold and blue.  Your arms or legs become red, warm, swollen, painful, or numb.  You have  chest pain or trouble breathing.  You suddenly have weakness in your face, arm, or leg.  You become very confused or you cannot speak.  You suddenly have a very bad headache.  You suddenly cannot see. This information is not intended to replace advice given to you by your health care provider. Make sure you discuss any questions you have with your health care provider. Document Released: 09/21/2009 Document Revised: 12/03/2015 Document Reviewed: 12/05/2013 Elsevier Interactive Patient Education  2017 [place]. Diabetes Mellitus and Exercise Exercising regularly is important for your overall health, especially when you have diabetes (diabetes mellitus). Exercising is not only about losing weight. It has many health benefits, such as increasing muscle strength and bone density and reducing body fat and stress. This leads to improved fitness, flexibility, and endurance, all of which result in better overall health. Exercise has additional benefits for people with diabetes, including:  Reducing appetite.  Helping to lower and control blood glucose.  Lowering blood pressure.  Helping to control amounts of fatty substances (lipids) in the blood, such as cholesterol and triglycerides.  Helping the body to respond better to insulin (improving insulin sensitivity).  Reducing how much insulin the body needs.  Decreasing the risk for heart disease by: ? Lowering cholesterol and triglyceride levels. ? Increasing the levels of good cholesterol. ? Lowering blood glucose levels.  What is my activity plan? Your health care  provider or certified diabetes educator can help you make a plan for the type and frequency of exercise (activity plan) that works for you. Make sure that you:  Do at least 150 minutes of moderate-intensity or vigorous-intensity exercise each week. This could be brisk walking, biking, or water aerobics. ? Do stretching and strength exercises, such as yoga or  weightlifting, at least 2 times a week. ? Spread out your activity over at least 3 days of the week.  Get some form of physical activity every day. ? Do not go more than 2 days in a row without some kind of physical activity. ? Avoid being inactive for more than 90 minutes at a time. Take frequent breaks to walk or stretch.  Choose a type of exercise or activity that you enjoy, and set realistic goals.  Start slowly, and gradually increase the intensity of your exercise over time.  What do I need to know about managing my diabetes?  Check your blood glucose before and after exercising. ? If your blood glucose is higher than 240 mg/dL (13.3 mmol/L) before you exercise, check your urine for ketones. If you have ketones in your urine, do not exercise until your blood glucose returns to normal.  Know the symptoms of low blood glucose (hypoglycemia) and how to treat it. Your risk for hypoglycemia increases during and after exercise. Common symptoms of hypoglycemia can include: ? Hunger. ? Anxiety. ? Sweating and feeling clammy. ? Confusion. ? Dizziness or feeling light-headed. ? Increased heart rate or palpitations. ? Blurry vision. ? Tingling or numbness around the mouth, lips, or tongue. ? Tremors or shakes. ? Irritability.  Keep a rapid-acting carbohydrate snack available before, during, and after exercise to help prevent or treat hypoglycemia.  Avoid injecting insulin into areas of the body that are going to be exercised. For example, avoid injecting insulin into: ? The arms, when playing tennis. ? The legs, when jogging.  Keep records of your exercise habits. Doing this can help you and your health care provider adjust your diabetes management plan as needed. Write down: ? Food that you eat before and after you exercise. ? Blood glucose levels before and after you exercise. ? The type and amount of exercise you have done. ? When your insulin is expected to peak, if you use  insulin. Avoid exercising at times when your insulin is peaking.  When you start a new exercise or activity, work with your health care provider to make sure the activity is safe for you, and to adjust your insulin, medicines, or food intake as needed.  Drink plenty of water while you exercise to prevent dehydration or heat stroke. Drink enough fluid to keep your urine clear or pale yellow. This information is not intended to replace advice given to you by your health care provider. Make sure you discuss any questions you have with your health care provider. Document Released: 09/17/2003 Document Revised: 01/15/2016 Document Reviewed: 12/07/2015 Elsevier Interactive Patient Education  2018 [employer]. Diabetes Mellitus and Nutrition When you have diabetes (diabetes mellitus), it is very important to have healthy eating habits because your blood sugar (glucose) levels are greatly affected by what you eat and drink. Eating healthy foods in the appropriate amounts, at about the same times every day, can help you:  Control your blood glucose.  Lower your risk of heart disease.  Improve your blood pressure.  Reach or maintain a healthy weight.  Every person with diabetes is different, and each person has  different needs for a meal plan. Your health care provider may recommend that you work with a diet and nutrition specialist (dietitian) to make a meal plan that is best for you. Your meal plan may vary depending on factors such as:  The calories you need.  The medicines you take.  Your weight.  Your blood glucose, blood pressure, and cholesterol levels.  Your activity level.  Other health conditions you have, such as heart or kidney disease.  How do carbohydrates affect me? Carbohydrates affect your blood glucose level more than any other type of food. Eating carbohydrates naturally increases the amount of glucose in your blood. Carbohydrate counting is a method for keeping track of  how many carbohydrates you eat. Counting carbohydrates is important to keep your blood glucose at a healthy level, especially if you use insulin or take certain oral diabetes medicines. It is important to know how many carbohydrates you can safely have in each meal. This is different for every person. Your dietitian can help you calculate how many carbohydrates you should have at each meal and for snack. Foods that contain carbohydrates include:  Bread, cereal, rice, pasta, and crackers.  Potatoes and corn.  Peas, beans, and lentils.  Milk and yogurt.  Fruit and juice.  Desserts, such as cakes, cookies, ice cream, and candy.  How does alcohol affect me? Alcohol can cause a sudden decrease in blood glucose (hypoglycemia), especially if you use insulin or take certain oral diabetes medicines. Hypoglycemia can be a life-threatening condition. Symptoms of hypoglycemia (sleepiness, dizziness, and confusion) are similar to symptoms of having too much alcohol. If your health care provider says that alcohol is safe for you, follow these guidelines:  Limit alcohol intake to no more than 1 drink per day for nonpregnant women and 2 drinks per day for men. One drink equals 12 oz of beer, 5 oz of wine, or 1 oz of hard liquor.  Do not drink on an empty stomach.  Keep yourself hydrated with water, diet soda, or unsweetened iced tea.  Keep in mind that regular soda, juice, and other mixers may contain a lot of sugar and must be counted as carbohydrates.  What are tips for following this plan? Reading food labels  Start by checking the serving size on the label. The amount of calories, carbohydrates, fats, and other nutrients listed on the label are based on one serving of the food. Many foods contain more than one serving per package.  Check the total grams (g) of carbohydrates in one serving. You can calculate the number of servings of carbohydrates in one serving by dividing the total  carbohydrates by 15. For example, if a food has 30 g of total carbohydrates, it would be equal to 2 servings of carbohydrates.  Check the number of grams (g) of saturated and trans fats in one serving. Choose foods that have low or no amount of these fats.  Check the number of milligrams (mg) of sodium in one serving. Most people should limit total sodium intake to less than 2,300 mg per day.  Always check the nutrition information of foods labeled as "low-fat" or "nonfat". These foods may be higher in added sugar or refined carbohydrates and should be avoided.  Talk to your dietitian to identify your daily goals for nutrients listed on the label. Shopping  Avoid buying canned, premade, or processed foods. These foods tend to be high in fat, sodium, and added sugar.  Shop around the outside edge of the  grocery store. This includes fresh fruits and vegetables, bulk grains, fresh meats, and fresh dairy. Cooking  Use low-heat cooking methods, such as baking, instead of high-heat cooking methods like deep frying.  Cook using healthy oils, such as olive, canola, or sunflower oil.  Avoid cooking with butter, cream, or high-fat meats. Meal planning  Eat meals and snacks regularly, preferably at the same times every day. Avoid going long periods of time without eating.  Eat foods high in fiber, such as fresh fruits, vegetables, beans, and whole grains. Talk to your dietitian about how many servings of carbohydrates you can eat at each meal.  Eat 4-6 ounces of lean protein each day, such as lean meat, chicken, fish, eggs, or tofu. 1 ounce is equal to 1 ounce of meat, chicken, or fish, 1 egg, or 1/4 cup of tofu.  Eat some foods each day that contain healthy fats, such as avocado, nuts, seeds, and fish. Lifestyle   Check your blood glucose regularly.  Exercise at least 30 minutes 5 or more days each week, or as told by your health care provider.  Take medicines as told by your health care  provider.  Do not use any products that contain nicotine or tobacco, such as cigarettes and e-cigarettes. If you need help quitting, ask your health care provider.  Work with a Social worker or diabetes educator to identify strategies to manage stress and any emotional and social challenges. What are some questions to ask my health care provider?  Do I need to meet with a diabetes educator?  Do I need to meet with a dietitian?  What number can I call if I have questions?  When are the best times to check my blood glucose? Where to find more information:  American Diabetes Association: diabetes.org/food-and-fitness/food  Academy of Nutrition and Dietetics: PokerClues.dk  [employer] of Diabetes and Digestive and Kidney Diseases (NIH): ContactWire.be Summary  A healthy meal plan will help you control your blood glucose and maintain a healthy lifestyle.  Working with a diet and nutrition specialist (dietitian) can help you make a meal plan that is best for you.  Keep in mind that carbohydrates and alcohol have immediate effects on your blood glucose levels. It is important to count carbohydrates and to use alcohol carefully. This information is not intended to replace advice given to you by your health care provider. Make sure you discuss any questions you have with your health care provider. Document Released: 03/24/2005 Document Revised: 08/01/2016 Document Reviewed: 08/01/2016 Elsevier Interactive Patient Education  2018 [employer]. Diabetes and Foot Care Diabetes may cause you to have problems because of poor blood supply (circulation) to your feet and legs. This may cause the skin on your feet to become thinner, break easier, and heal more slowly. Your skin may become dry, and the skin may peel and crack. You may also have nerve damage in your legs and  feet causing decreased feeling in them. You may not notice minor injuries to your feet that could lead to infections or more serious problems. Taking care of your feet is one of the most important things you can do for yourself. Follow these instructions at home:  Wear shoes at all times, even in the house. Do not go barefoot. Bare feet are easily injured.  Check your feet daily for blisters, cuts, and redness. If you cannot see the bottom of your feet, use a mirror or ask someone for help.  Wash your feet with warm water (do not  use hot water) and mild soap. Then pat your feet and the areas between your toes until they are completely dry. Do not soak your feet as this can dry your skin.  Apply a moisturizing lotion or petroleum jelly (that does not contain alcohol and is unscented) to the skin on your feet and to dry, brittle toenails. Do not apply lotion between your toes.  Trim your toenails straight across. Do not dig under them or around the cuticle. File the edges of your nails with an emery board or nail file.  Do not cut corns or calluses or try to remove them with medicine.  Wear clean socks or stockings every day. Make sure they are not too tight. Do not wear knee-high stockings since they may decrease blood flow to your legs.  Wear shoes that fit properly and have enough cushioning. To break in new shoes, wear them for just a few hours a day. This prevents you from injuring your feet. Always look in your shoes before you put them on to be sure there are no objects inside.  Do not cross your legs. This may decrease the blood flow to your feet.  If you find a minor scrape, cut, or break in the skin on your feet, keep it and the skin around it clean and dry. These areas may be cleansed with mild soap and water. Do not cleanse the area with peroxide, alcohol, or iodine.  When you remove an adhesive bandage, be sure not to damage the skin around it.  If you have a wound, look at it  several times a day to make sure it is healing.  Do not use heating pads or hot water bottles. They may burn your skin. If you have lost feeling in your feet or legs, you may not know it is happening until it is too late.  Make sure your health care provider performs a complete foot exam at least annually or more often if you have foot problems. Report any cuts, sores, or bruises to your health care provider immediately. Contact a health care provider if:  You have an injury that is not healing.  You have cuts or breaks in the skin.  You have an ingrown nail.  You notice redness on your legs or feet.  You feel burning or tingling in your legs or feet.  You have pain or cramps in your legs and feet.  Your legs or feet are numb.  Your feet always feel cold. Get help right away if:  There is increasing redness, swelling, or pain in or around a wound.  There is a red line that goes up your leg.  Pus is coming from a wound.  You develop a fever or as directed by your health care provider.  You notice a bad smell coming from an ulcer or wound. This information is not intended to replace advice given to you by your health care provider. Make sure you discuss any questions you have with your health care provider. Document Released: 06/24/2000 Document Revised: 12/03/2015 Document Reviewed: 12/04/2012 Elsevier Interactive Patient Education  2017 [employer].

## 2017-08-23 NOTE — Telephone Encounter (Signed)
Patient dropped off immigration paper work. Please call once it is complete

## 2017-08-23 NOTE — Progress Notes (Signed)
F/u hospital She was admitted to the hospital for 4 days. She was transfused 2 units of blood for anemia. She was bleeding, had diarrhea for 3 days. Was found to have UTI and Fibroid. OBGYN consulted, has follow up appointment. No more bleeding.

## 2017-08-24 LAB — CMP14+EGFR
A/G RATIO: 1.2 (ref 1.2–2.2)
ALBUMIN: 3.6 g/dL (ref 3.5–5.5)
ALK PHOS: 103 IU/L (ref 39–117)
ALT: 21 IU/L (ref 0–32)
AST: 11 IU/L (ref 0–40)
BILIRUBIN TOTAL: 0.2 mg/dL (ref 0.0–1.2)
BUN / CREAT RATIO: 24 — AB (ref 9–23)
BUN: 26 mg/dL — ABNORMAL HIGH (ref 6–24)
CO2: 21 mmol/L (ref 20–29)
Calcium: 9 mg/dL (ref 8.7–10.2)
Chloride: 106 mmol/L (ref 96–106)
Creatinine, Ser: 1.07 mg/dL — ABNORMAL HIGH (ref 0.57–1.00)
GFR calc Af Amer: 67 mL/min/{1.73_m2} (ref >59)
GFR calc non Af Amer: 58 mL/min/{1.73_m2} — ABNORMAL LOW (ref >59)
GLOBULIN, TOTAL: 2.9 g/dL (ref 1.5–4.5)
Glucose: 192 mg/dL — ABNORMAL HIGH (ref 65–99)
Potassium: 5 mmol/L (ref 3.5–5.2)
Sodium: 143 mmol/L (ref 134–144)
Total Protein: 6.5 g/dL (ref 6.0–8.5)

## 2017-08-24 LAB — CBC WITH DIFFERENTIAL/PLATELET
Basophils Absolute: 0 10*3/uL (ref 0.0–0.2)
Basos: 1 %
EOS (ABSOLUTE): 0.4 10*3/uL (ref 0.0–0.4)
EOS: 5 %
HEMATOCRIT: 35 % (ref 34.0–46.6)
HEMOGLOBIN: 11.1 g/dL (ref 11.1–15.9)
Immature Grans (Abs): 0 10*3/uL (ref 0.0–0.1)
Immature Granulocytes: 0 %
LYMPHS ABS: 3.6 10*3/uL — AB (ref 0.7–3.1)
Lymphs: 44 %
MCH: 26.9 pg (ref 26.6–33.0)
MCHC: 31.7 g/dL (ref 31.5–35.7)
MCV: 85 fL (ref 79–97)
MONOCYTES: 8 %
MONOS ABS: 0.7 10*3/uL (ref 0.1–0.9)
NEUTROS ABS: 3.4 10*3/uL (ref 1.4–7.0)
Neutrophils: 42 %
Platelets: 267 10*3/uL (ref 150–379)
RBC: 4.13 x10E6/uL (ref 3.77–5.28)
RDW: 14.3 % (ref 12.3–15.4)
WBC: 8.1 10*3/uL (ref 3.4–10.8)

## 2017-08-24 LAB — LIPID PANEL
CHOL/HDL RATIO: 3.1 ratio (ref 0.0–4.4)
Cholesterol, Total: 110 mg/dL (ref 100–199)
HDL: 35 mg/dL — AB (ref >39)
LDL Calculated: 60 mg/dL (ref 0–99)
Triglycerides: 76 mg/dL (ref 0–149)
VLDL CHOLESTEROL CAL: 15 mg/dL (ref 5–40)

## 2017-08-25 ENCOUNTER — Telehealth: Payer: Self-pay | Admitting: Internal Medicine

## 2017-08-25 NOTE — Telephone Encounter (Signed)
Patient stopped by to check on immigration paperwork please fu with patient.

## 2017-08-29 ENCOUNTER — Other Ambulatory Visit: Payer: Self-pay | Admitting: Internal Medicine

## 2017-08-29 DIAGNOSIS — R3981 Functional urinary incontinence: Secondary | ICD-10-CM

## 2017-08-29 DIAGNOSIS — I739 Peripheral vascular disease, unspecified: Secondary | ICD-10-CM

## 2017-08-29 DIAGNOSIS — R0602 Shortness of breath: Secondary | ICD-10-CM

## 2017-08-29 DIAGNOSIS — E1151 Type 2 diabetes mellitus with diabetic peripheral angiopathy without gangrene: Secondary | ICD-10-CM

## 2017-08-29 DIAGNOSIS — E1165 Type 2 diabetes mellitus with hyperglycemia: Secondary | ICD-10-CM

## 2017-08-29 DIAGNOSIS — I10 Essential (primary) hypertension: Secondary | ICD-10-CM

## 2017-08-29 DIAGNOSIS — F4310 Post-traumatic stress disorder, unspecified: Secondary | ICD-10-CM

## 2017-08-29 MED ORDER — INSULIN GLARGINE 100 UNIT/ML ~~LOC~~ SOLN: 15.0000 [IU] | Freq: Every day | SUBCUTANEOUS | 3 refills | Status: DC

## 2017-08-29 MED ORDER — GABAPENTIN 300 MG PO CAPS: 300.0000 mg | ORAL_CAPSULE | Freq: Four times a day (QID) | ORAL | 3 refills | Status: AC

## 2017-08-29 MED ORDER — LISINOPRIL 5 MG PO TABS: 5.0000 mg | ORAL_TABLET | Freq: Two times a day (BID) | ORAL | 3 refills | Status: AC

## 2017-08-29 MED ORDER — CLOPIDOGREL BISULFATE 75 MG PO TABS: ORAL_TABLET | ORAL | 3 refills | Status: DC

## 2017-08-29 MED ORDER — ATORVASTATIN CALCIUM 40 MG PO TABS: 40.0000 mg | ORAL_TABLET | ORAL | 3 refills | Status: DC

## 2017-08-29 MED ORDER — CARVEDILOL 12.5 MG PO TABS: 12.5000 mg | ORAL_TABLET | Freq: Two times a day (BID) | ORAL | 3 refills | Status: DC

## 2017-08-29 MED ORDER — OXYBUTYNIN CHLORIDE 5 MG PO TABS: 5.0000 mg | ORAL_TABLET | Freq: Three times a day (TID) | ORAL | 2 refills | Status: DC

## 2017-08-29 MED ORDER — DULOXETINE HCL 40 MG PO CPEP: 40.0000 mg | ORAL_CAPSULE | Freq: Every day | ORAL | 3 refills | Status: DC

## 2017-08-29 MED ORDER — FUROSEMIDE 20 MG PO TABS: 20.0000 mg | ORAL_TABLET | Freq: Two times a day (BID) | ORAL | 3 refills | Status: DC

## 2017-08-29 MED ORDER — DOXYCYCLINE HYCLATE 100 MG PO TABS: 100.0000 mg | ORAL_TABLET | Freq: Two times a day (BID) | ORAL | 0 refills | Status: AC

## 2017-08-29 MED ORDER — SAXAGLIPTIN HCL 2.5 MG PO TABS: 2.5000 mg | ORAL_TABLET | Freq: Two times a day (BID) | ORAL | 3 refills | Status: DC

## 2017-08-29 MED ORDER — GUAIFENESIN-DM 100-10 MG/5ML PO SYRP: 10.0000 mL | ORAL_SOLUTION | ORAL | 1 refills | Status: AC | PRN

## 2017-08-29 MED ORDER — POTASSIUM CHLORIDE ER 10 MEQ PO TBCR: 10.0000 meq | EXTENDED_RELEASE_TABLET | Freq: Every day | ORAL | 3 refills | Status: AC

## 2017-08-30 ENCOUNTER — Telehealth: Payer: Self-pay | Admitting: Internal Medicine

## 2017-08-30 DIAGNOSIS — E1165 Type 2 diabetes mellitus with hyperglycemia: Secondary | ICD-10-CM

## 2017-08-30 DIAGNOSIS — L299 Pruritus, unspecified: Secondary | ICD-10-CM

## 2017-08-30 MED ORDER — GLUCOSE BLOOD VI STRP: ORAL_STRIP | 5 refills | Status: AC

## 2017-08-30 MED ORDER — CLOTRIMAZOLE 1 % EX CREA
1.0000 | TOPICAL_CREAM | Freq: Two times a day (BID) | CUTANEOUS | 0 refills | Status: DC
Start: 2017-08-30 — End: 2017-10-03

## 2017-08-30 NOTE — Telephone Encounter (Signed)
Pt called to request a refill on  -glucose blood (ACCU-CHEK AVIVA PLUS) test strip  -clotrimazole (LOTRIMIN) 1 % cream  -Prescribed oil Please send to  -[facility] [place] [address] Please follow up

## 2017-08-30 NOTE — Addendum Note (Signed)
Addended by: Theda Sers K on: 08/30/2017 05:00 PM   Modules accepted: Orders

## 2017-08-30 NOTE — Telephone Encounter (Signed)
Refilled

## 2017-09-05 NOTE — Telephone Encounter (Signed)
Patient verified DOB Patient is aware of sisters labs being normal and needing to drink plenty of water and take medications as prescribed. Patient is also aware of her labs being better than previous results and needing to take 2 tablets to address low functioning thyroid. Patient is aware of recheck and medication adjustment being evaluated in 3 months. No further questions at this time.

## 2017-09-05 NOTE — Telephone Encounter (Signed)
-----   Message from Tresa Garter, MD sent at 08/28/2017  6:29 PM EST ----- Labs are mostly normal. Encourage liberal oral fluid intake (drink plenty of water). Take current medications as prescribed. No change in therapy.

## 2017-09-06 ENCOUNTER — Telehealth: Payer: Self-pay | Admitting: Internal Medicine

## 2017-09-06 DIAGNOSIS — I739 Peripheral vascular disease, unspecified: Secondary | ICD-10-CM

## 2017-09-06 MED ORDER — ATORVASTATIN CALCIUM 40 MG PO TABS
40.0000 mg | ORAL_TABLET | ORAL | 0 refills | Status: AC
Start: 2017-09-06 — End: ?

## 2017-09-06 MED ORDER — ATORVASTATIN CALCIUM 40 MG PO TABS: 40.0000 mg | ORAL_TABLET | ORAL | 0 refills | Status: DC

## 2017-09-06 NOTE — Telephone Encounter (Signed)
Patient requested to refill listed medications  atorvastatin (LIPITOR) 40 MG tablet [[id number]]  [facility] [address].

## 2017-09-06 NOTE — Telephone Encounter (Signed)
Refill sent to [facility]

## 2017-09-08 ENCOUNTER — Telehealth: Payer: Self-pay | Admitting: Internal Medicine

## 2017-09-08 NOTE — Telephone Encounter (Signed)
Call placed to patient's sister phone number to inquire if they had received a call from [place]. No answer. Left Iman a message asking her to return my call at [phone].

## 2017-10-03 ENCOUNTER — Other Ambulatory Visit: Payer: Self-pay | Admitting: Internal Medicine

## 2017-10-03 DIAGNOSIS — L299 Pruritus, unspecified: Secondary | ICD-10-CM

## 2017-11-03 ENCOUNTER — Telehealth: Payer: Self-pay | Admitting: Family Medicine

## 2017-11-03 ENCOUNTER — Other Ambulatory Visit: Payer: Self-pay

## 2017-11-03 DIAGNOSIS — L299 Pruritus, unspecified: Secondary | ICD-10-CM

## 2017-11-03 MED ORDER — "INSULIN SYRINGE-NEEDLE U-100 31G X 5/16"" 1 ML MISC": 11 refills | Status: AC

## 2017-11-03 MED ORDER — CLOTRIMAZOLE 1 % EX CREA: TOPICAL_CREAM | CUTANEOUS | 0 refills | Status: DC

## 2017-11-03 NOTE — Telephone Encounter (Signed)
Pharmacy call to request a refill for clotrimazole (LOTRIMIN) 1 % cream BD INSULIN SYRINGE U/F 31G X 5/16" 1 ML MISC  Please sent it [facility] located [address], [place], [place] [address] fa Please follow up

## 2017-11-03 NOTE — Telephone Encounter (Signed)
done

## 2017-11-14 IMAGING — CT CT ANGIO AOBIFEM WO/W CM
1 of 10 series · 3 of 16 positions shown, 4 images · IV contrast (ISOVUE 370)
Comparison: None.

CLINICAL DATA: Evaluate peripheral arterial disease. Sores in both
legs and feet. Abnormal ultrasound.

EXAM:
CT ANGIOGRAPHY OF ABDOMINAL AORTA WITH ILIOFEMORAL RUNOFF
TECHNIQUE: Multidetector CT imaging of the abdomen, pelvis and lower
extremities was performed using the standard protocol during bolus
administration of intravenous contrast. Multiplanar CT image
reconstructions and MIPs were obtained to evaluate the vascular
anatomy.
CONTRAST:  150 mL Isovue 370

[Series 4: arterial · axial · arterial · 0.98mm/px · z∈[-1482,-123]mm · 3 of 454 slices shown, 4 images]
[im 1/454  soft-tissue]
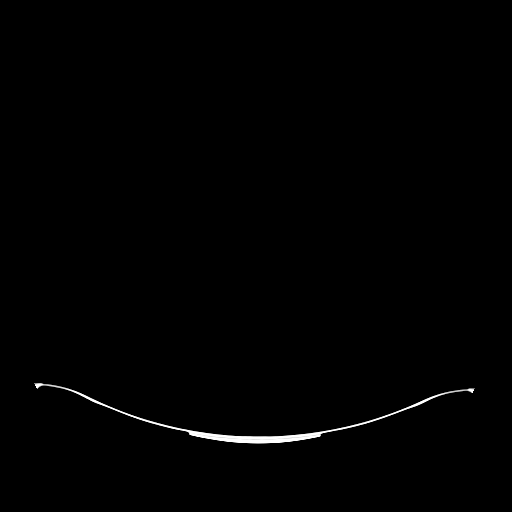
[im 1/454  bone]
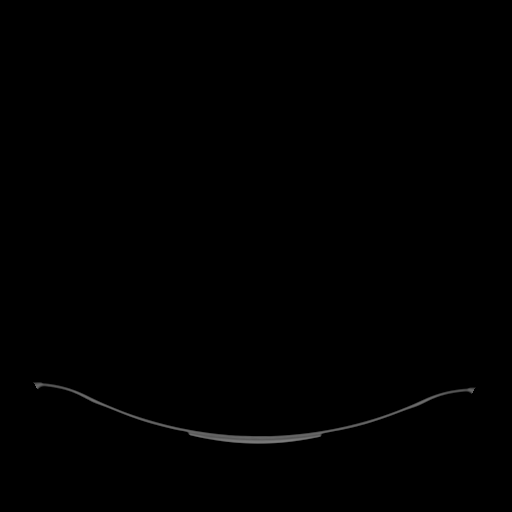
[im 227/454  soft-tissue]
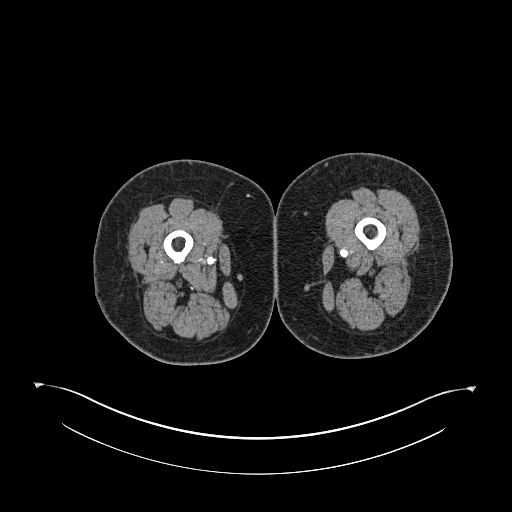
[im 454/454  soft-tissue]
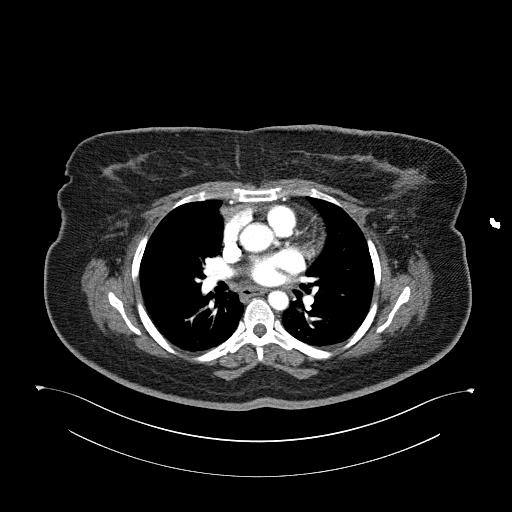

[3 of 16 positions shown; findings below may reference images not displayed]

FINDINGS: Vascular

Aorta: Normal caliber of the abdominal aorta with minimal
atherosclerotic disease. Narrowing of the proximal celiac artery
with a hook configuration. Findings are suggestive for median
arcuate ligament compression. Main branches of the celiac trunk are
patent. SMA is widely patent. Inferior mesenteric artery is patent.
Bilateral renal arteries are widely patent.

Right lower extremity: Right common, internal and external iliac
arteries are patent with minimal atherosclerotic disease. Right
common femoral artery is widely patent. The right profunda femoral
arteries are patent. Peripheral calcifications in the right SFA
without significant stenosis. Mild disease in the popliteal artery
without significant stenosis. Three-vessel runoff in the right lower
extremity. Dorsalis pedis artery and posterior tibial artery are
patent at the ankle.

Left lower extremity: Left iliac arteries are widely patent with
minimal atherosclerotic disease. Left common femoral artery is
patent. Peripheral calcifications involving the left femoral
arteries without significant stenosis. Left SFA is patent with mild
disease. Small amount of plaque in the proximal popliteal artery
without significant stenosis. Three-vessel runoff in the left lower
extremity. Flow at the ankle from the posterior tibial artery and
dorsalis pedis artery.

Non Vascular

Musculoskeletal: Prior amputation in the mid right foot. Severe disc
space loss at L5-S1. No acute bone abnormality.

Lung bases:  Lung bases are clear.  No pleural effusions.

Liver/biliary: Mild distention of the gallbladder. No gross
abnormality to the liver.

Spleen/pancreas: Normal appearance of the pancreas without
inflammation or duct dilatation. Normal appearance of spleen without
enlargement.

Adrenal/Urinary tract: Normal adrenal glands. Normal appearance of
both kidneys and urinary bladder.

Stomach and bowel: Normal appearance of stomach and duodenum. No
acute abnormality to the small or large bowel. Normal appearance of
the appendix.

Reproductive: No gross abnormality to the uterus or adnexal tissue.

Other:  No free fluid.  No free air.

Lymphatics:  No suspicious lymphadenopathy.
IMPRESSION: No significant inflow, outflow or runoff disease. Minimal
atherosclerotic disease in the lower extremities.

Narrowing of the celiac artery trunk is compatible with median
arcuate ligament compression.

Prior right foot amputation at the Lisfranc joint.

## 2017-11-22 ENCOUNTER — Ambulatory Visit: Payer: Self-pay | Admitting: Family Medicine

## 2017-12-05 ENCOUNTER — Other Ambulatory Visit: Payer: Self-pay | Admitting: Family Medicine

## 2017-12-05 DIAGNOSIS — L299 Pruritus, unspecified: Secondary | ICD-10-CM

## 2017-12-11 ENCOUNTER — Other Ambulatory Visit: Payer: Self-pay | Admitting: *Deleted

## 2017-12-11 DIAGNOSIS — E1165 Type 2 diabetes mellitus with hyperglycemia: Secondary | ICD-10-CM

## 2017-12-11 MED ORDER — INSULIN ASPART 100 UNIT/ML ~~LOC~~ SOLN: SUBCUTANEOUS | 1 refills | Status: AC

## 2017-12-28 ENCOUNTER — Other Ambulatory Visit: Payer: Self-pay

## 2017-12-28 DIAGNOSIS — E1151 Type 2 diabetes mellitus with diabetic peripheral angiopathy without gangrene: Secondary | ICD-10-CM

## 2017-12-28 MED ORDER — INSULIN GLARGINE 100 UNIT/ML ~~LOC~~ SOLN: 15.0000 [IU] | Freq: Every day | SUBCUTANEOUS | 3 refills | Status: DC

## 2018-01-01 ENCOUNTER — Telehealth: Payer: Self-pay | Admitting: Family Medicine

## 2018-01-01 NOTE — Telephone Encounter (Signed)
page paperwork received through fax 01-01-18.

## 2018-01-02 ENCOUNTER — Other Ambulatory Visit: Payer: Self-pay

## 2018-01-02 DIAGNOSIS — R3981 Functional urinary incontinence: Secondary | ICD-10-CM

## 2018-01-02 MED ORDER — OXYBUTYNIN CHLORIDE 5 MG PO TABS: 5.0000 mg | ORAL_TABLET | Freq: Three times a day (TID) | ORAL | 2 refills | Status: AC

## 2018-01-29 ENCOUNTER — Other Ambulatory Visit: Payer: Self-pay

## 2018-01-29 DIAGNOSIS — I10 Essential (primary) hypertension: Secondary | ICD-10-CM

## 2018-01-29 DIAGNOSIS — E1165 Type 2 diabetes mellitus with hyperglycemia: Secondary | ICD-10-CM

## 2018-01-29 DIAGNOSIS — I739 Peripheral vascular disease, unspecified: Secondary | ICD-10-CM

## 2018-01-29 MED ORDER — SAXAGLIPTIN HCL 2.5 MG PO TABS: 2.5000 mg | ORAL_TABLET | Freq: Two times a day (BID) | ORAL | 0 refills | Status: DC

## 2018-01-29 MED ORDER — CARVEDILOL 12.5 MG PO TABS: 12.5000 mg | ORAL_TABLET | Freq: Two times a day (BID) | ORAL | 0 refills | Status: DC

## 2018-01-30 ENCOUNTER — Other Ambulatory Visit: Payer: Self-pay | Admitting: Family Medicine

## 2018-01-30 DIAGNOSIS — L299 Pruritus, unspecified: Secondary | ICD-10-CM

## 2018-01-30 MED ORDER — CLOPIDOGREL BISULFATE 75 MG PO TABS: ORAL_TABLET | ORAL | 0 refills | Status: DC

## 2018-01-30 NOTE — Telephone Encounter (Signed)
Pt has not been seen here since Dr. Doreene Burke saw her 08/23/17. She has no office visit planned. Will send in for 1 month of Plavix. She must make appointment to re-establish before receiving additional fills.

## 2018-01-31 ENCOUNTER — Other Ambulatory Visit: Payer: Self-pay | Admitting: Internal Medicine

## 2018-01-31 DIAGNOSIS — I10 Essential (primary) hypertension: Secondary | ICD-10-CM

## 2018-01-31 DIAGNOSIS — F4310 Post-traumatic stress disorder, unspecified: Secondary | ICD-10-CM

## 2018-02-23 ENCOUNTER — Other Ambulatory Visit: Payer: Self-pay | Admitting: Internal Medicine

## 2018-02-23 DIAGNOSIS — I10 Essential (primary) hypertension: Secondary | ICD-10-CM

## 2018-02-23 DIAGNOSIS — E1165 Type 2 diabetes mellitus with hyperglycemia: Secondary | ICD-10-CM

## 2018-02-24 ENCOUNTER — Other Ambulatory Visit: Payer: Self-pay | Admitting: Internal Medicine

## 2018-02-24 DIAGNOSIS — I739 Peripheral vascular disease, unspecified: Secondary | ICD-10-CM

## 2018-02-26 ENCOUNTER — Other Ambulatory Visit: Payer: Self-pay | Admitting: Family Medicine

## 2018-02-26 ENCOUNTER — Other Ambulatory Visit: Payer: Self-pay | Admitting: Internal Medicine

## 2018-02-26 DIAGNOSIS — I10 Essential (primary) hypertension: Secondary | ICD-10-CM

## 2018-02-28 ENCOUNTER — Other Ambulatory Visit: Payer: Self-pay | Admitting: Internal Medicine

## 2018-02-28 DIAGNOSIS — E1165 Type 2 diabetes mellitus with hyperglycemia: Secondary | ICD-10-CM

## 2018-03-23 ENCOUNTER — Other Ambulatory Visit: Payer: Self-pay | Admitting: Family Medicine

## 2018-03-23 DIAGNOSIS — E1165 Type 2 diabetes mellitus with hyperglycemia: Secondary | ICD-10-CM

## 2018-03-23 DIAGNOSIS — R3981 Functional urinary incontinence: Secondary | ICD-10-CM

## 2018-04-02 ENCOUNTER — Other Ambulatory Visit: Payer: Self-pay | Admitting: Family Medicine

## 2018-04-02 DIAGNOSIS — E1165 Type 2 diabetes mellitus with hyperglycemia: Secondary | ICD-10-CM

## 2018-04-02 DIAGNOSIS — R3981 Functional urinary incontinence: Secondary | ICD-10-CM

## 2018-04-03 ENCOUNTER — Other Ambulatory Visit: Payer: Self-pay | Admitting: Family Medicine

## 2018-04-03 ENCOUNTER — Other Ambulatory Visit: Payer: Self-pay | Admitting: Internal Medicine

## 2018-04-03 DIAGNOSIS — I10 Essential (primary) hypertension: Secondary | ICD-10-CM

## 2018-04-03 DIAGNOSIS — E1165 Type 2 diabetes mellitus with hyperglycemia: Secondary | ICD-10-CM

## 2018-04-03 DIAGNOSIS — I739 Peripheral vascular disease, unspecified: Secondary | ICD-10-CM

## 2018-04-03 DIAGNOSIS — R3981 Functional urinary incontinence: Secondary | ICD-10-CM

## 2018-04-25 ENCOUNTER — Other Ambulatory Visit: Payer: Self-pay | Admitting: Family Medicine

## 2018-04-25 DIAGNOSIS — E1151 Type 2 diabetes mellitus with diabetic peripheral angiopathy without gangrene: Secondary | ICD-10-CM

## 2018-05-16 ENCOUNTER — Other Ambulatory Visit: Payer: Self-pay | Admitting: Family Medicine

## 2018-05-16 DIAGNOSIS — L299 Pruritus, unspecified: Secondary | ICD-10-CM

## 2018-09-25 ENCOUNTER — Other Ambulatory Visit: Payer: Self-pay | Admitting: Internal Medicine
